# Patient Record
Sex: Male | Born: 1940 | Race: Black or African American | Hispanic: No | Smoking: Current every day smoker
Health system: Southern US, Community
[De-identification: ages and names within clinical notes are randomized; demographics above are authoritative.]

## PROBLEM LIST (undated history)

## (undated) DIAGNOSIS — J439 Emphysema, unspecified: Secondary | ICD-10-CM

## (undated) DIAGNOSIS — D649 Anemia, unspecified: Secondary | ICD-10-CM

## (undated) DIAGNOSIS — K209 Esophagitis, unspecified without bleeding: Secondary | ICD-10-CM

## (undated) DIAGNOSIS — I671 Cerebral aneurysm, nonruptured: Secondary | ICD-10-CM

## (undated) DIAGNOSIS — I10 Essential (primary) hypertension: Secondary | ICD-10-CM

## (undated) DIAGNOSIS — E7800 Pure hypercholesterolemia, unspecified: Secondary | ICD-10-CM

## (undated) DIAGNOSIS — I700 Atherosclerosis of aorta: Secondary | ICD-10-CM

## (undated) DIAGNOSIS — K259 Gastric ulcer, unspecified as acute or chronic, without hemorrhage or perforation: Secondary | ICD-10-CM

## (undated) DIAGNOSIS — J449 Chronic obstructive pulmonary disease, unspecified: Secondary | ICD-10-CM

## (undated) DIAGNOSIS — F17200 Nicotine dependence, unspecified, uncomplicated: Secondary | ICD-10-CM

## (undated) DIAGNOSIS — A048 Other specified bacterial intestinal infections: Secondary | ICD-10-CM

## (undated) HISTORY — DX: Gastric ulcer, unspecified as acute or chronic, without hemorrhage or perforation: K25.9

## (undated) HISTORY — DX: Other specified bacterial intestinal infections: A04.8

## (undated) HISTORY — DX: Chronic obstructive pulmonary disease, unspecified: J44.9

## (undated) HISTORY — DX: Emphysema, unspecified: J43.9

## (undated) HISTORY — DX: Atherosclerosis of aorta: I70.0

## (undated) HISTORY — DX: Esophagitis, unspecified without bleeding: K20.90

## (undated) HISTORY — DX: Anemia, unspecified: D64.9

## (undated) HISTORY — DX: Cerebral aneurysm, nonruptured: I67.1

---

## 2017-09-19 ENCOUNTER — Other Ambulatory Visit: Payer: Self-pay

## 2017-09-19 ENCOUNTER — Inpatient Hospital Stay (HOSPITAL_BASED_OUTPATIENT_CLINIC_OR_DEPARTMENT_OTHER)
Admission: EM | Admit: 2017-09-19 | Discharge: 2017-09-21 | DRG: 871 | Disposition: A | Discharge status: Home / Self Care | Payer: Medicare Other | Attending: Internal Medicine | Admitting: Internal Medicine

## 2017-09-19 ENCOUNTER — Emergency Department (HOSPITAL_BASED_OUTPATIENT_CLINIC_OR_DEPARTMENT_OTHER): Payer: Medicare Other

## 2017-09-19 ENCOUNTER — Encounter (HOSPITAL_BASED_OUTPATIENT_CLINIC_OR_DEPARTMENT_OTHER): Payer: Self-pay | Admitting: *Deleted

## 2017-09-19 DIAGNOSIS — Z23 Encounter for immunization: Secondary | ICD-10-CM | POA: Diagnosis present

## 2017-09-19 DIAGNOSIS — J181 Lobar pneumonia, unspecified organism: Secondary | ICD-10-CM | POA: Diagnosis present

## 2017-09-19 DIAGNOSIS — Z79899 Other long term (current) drug therapy: Secondary | ICD-10-CM

## 2017-09-19 DIAGNOSIS — A419 Sepsis, unspecified organism: Secondary | ICD-10-CM

## 2017-09-19 DIAGNOSIS — I5030 Unspecified diastolic (congestive) heart failure: Secondary | ICD-10-CM | POA: Diagnosis not present

## 2017-09-19 DIAGNOSIS — J189 Pneumonia, unspecified organism: Secondary | ICD-10-CM | POA: Diagnosis present

## 2017-09-19 DIAGNOSIS — E785 Hyperlipidemia, unspecified: Secondary | ICD-10-CM | POA: Diagnosis present

## 2017-09-19 DIAGNOSIS — F17210 Nicotine dependence, cigarettes, uncomplicated: Secondary | ICD-10-CM | POA: Diagnosis present

## 2017-09-19 DIAGNOSIS — I471 Supraventricular tachycardia: Secondary | ICD-10-CM | POA: Diagnosis not present

## 2017-09-19 HISTORY — DX: Sepsis, unspecified organism: A41.9

## 2017-09-19 HISTORY — DX: Pure hypercholesterolemia, unspecified: E78.00

## 2017-09-19 LAB — BASIC METABOLIC PANEL
ANION GAP: 12 (ref 5–15)
BUN: 13 mg/dL (ref 6–20)
CO2: 22 mmol/L (ref 22–32)
Calcium: 8.8 mg/dL — ABNORMAL LOW (ref 8.9–10.3)
Chloride: 104 mmol/L (ref 101–111)
Creatinine, Ser: 1.13 mg/dL (ref 0.61–1.24)
GFR calc Af Amer: >60 mL/min (ref >60)
GFR calc non Af Amer: >60 mL/min (ref >60)
Glucose, Bld: 126 mg/dL — ABNORMAL HIGH (ref 65–99)
POTASSIUM: 3.6 mmol/L (ref 3.5–5.1)
SODIUM: 138 mmol/L (ref 135–145)

## 2017-09-19 LAB — I-STAT VENOUS BLOOD GAS, ED
ACID-BASE DEFICIT: 1 mmol/L (ref 0.0–2.0)
Bicarbonate: 23.7 mmol/L (ref 20.0–28.0)
O2 SAT: 80 %
PH VEN: 7.396 (ref 7.250–7.430)
TCO2: 25 mmol/L (ref 22–32)
pCO2, Ven: 38.9 mmHg — ABNORMAL LOW (ref 44.0–60.0)
pO2, Ven: 46 mmHg — ABNORMAL HIGH (ref 32.0–45.0)

## 2017-09-19 LAB — CBC
HCT: 42.2 % (ref 39.0–52.0)
HEMOGLOBIN: 14.2 g/dL (ref 13.0–17.0)
MCH: 31.2 pg (ref 26.0–34.0)
MCHC: 33.6 g/dL (ref 30.0–36.0)
MCV: 92.7 fL (ref 78.0–100.0)
Platelets: 272 10*3/uL (ref 150–400)
RBC: 4.55 MIL/uL (ref 4.22–5.81)
RDW: 14 % (ref 11.5–15.5)
WBC: 8.3 10*3/uL (ref 4.0–10.5)

## 2017-09-19 LAB — I-STAT CG4 LACTIC ACID, ED: LACTIC ACID, VENOUS: 1.23 mmol/L (ref 0.5–1.9)

## 2017-09-19 LAB — INFLUENZA PANEL BY PCR (TYPE A & B)
INFLAPCR: NEGATIVE
Influenza B By PCR: NEGATIVE

## 2017-09-19 MED ORDER — SODIUM CHLORIDE 0.9 % IV SOLN
1.0000 g | Freq: Once | INTRAVENOUS | Status: AC
  Administered 2017-09-19: 1 g via INTRAVENOUS
  Filled 2017-09-19: qty 10

## 2017-09-19 MED ORDER — IPRATROPIUM-ALBUTEROL 0.5-2.5 (3) MG/3ML IN SOLN
3.0000 mL | Freq: Three times a day (TID) | RESPIRATORY_TRACT | Status: DC
  Administered 2017-09-20: 3 mL via RESPIRATORY_TRACT
  Filled 2017-09-19: qty 3

## 2017-09-19 MED ORDER — IPRATROPIUM-ALBUTEROL 0.5-2.5 (3) MG/3ML IN SOLN
3.0000 mL | Freq: Four times a day (QID) | RESPIRATORY_TRACT | Status: DC
  Administered 2017-09-19: 3 mL via RESPIRATORY_TRACT
  Filled 2017-09-19: qty 3

## 2017-09-19 MED ORDER — SODIUM CHLORIDE 0.9 % IV SOLN
500.0000 mg | Freq: Once | INTRAVENOUS | Status: AC
  Administered 2017-09-19: 500 mg via INTRAVENOUS
  Filled 2017-09-19: qty 500

## 2017-09-19 MED ORDER — ACETAMINOPHEN 325 MG PO TABS
650.0000 mg | ORAL_TABLET | Freq: Once | ORAL | Status: AC
  Administered 2017-09-19: 650 mg via ORAL
  Filled 2017-09-19: qty 2

## 2017-09-19 MED ORDER — AZITHROMYCIN 500 MG IV SOLR
INTRAVENOUS | Status: AC
  Filled 2017-09-19: qty 500

## 2017-09-19 MED ORDER — SODIUM CHLORIDE 0.9 % IV BOLUS (SEPSIS): 1000.0000 mL | Freq: Once | INTRAVENOUS | Status: DC

## 2017-09-19 NOTE — ED Notes (Signed)
Pt. Has been sick per Daughter since Wednesday with upper resp. Illness and cough.  Pt. Today was lying in bed fully clothed and acting tired and sleepy daughter brought pt. Here to ED.  Pt. Sat on arrival to room 89-90% on RA.

## 2017-09-19 NOTE — ED Triage Notes (Signed)
Body aches, sore throat, weakness x 2 days. Tylenol last night.

## 2017-09-19 NOTE — Plan of Care (Signed)
[facility] by Dr. Adela LankFloyd None Pcp    Selmer DominionWillie Platner 77 y.o. With  Past hx of hypercholesteremia     Presents today with flu like symptoms of 3 days  Blood pressure (!) 149/81, pulse 90, temperature (!) 102.5 F (39.2 C), temperature source Oral, resp. rate 18, height 6' (1.829 m), weight 83 kg (183 lb), SpO2 99 %. On 2L    Significant findings:       Lactic Acid, Venous    Component Value Date/Time   LATICACIDVEN 1.23 09/19/2017 1851      Radiology  CXR: infiltrate  Worrisome for pneumonia in the setting of fluid symptoms and Sirs  Started azythro and rocephine     Plan to Admit inpatient  On TELE  Shekita Boyden 8:15 PM

## 2017-09-19 NOTE — ED Notes (Signed)
Called to give report to RN   RN in Pt. Room said she will call back for Report.  Gave my name and number

## 2017-09-19 NOTE — ED Provider Notes (Signed)
[facility] EMERGENCY DEPARTMENT Provider Note   CSN: [id number] Arrival date & time: 09/19/17  1753     History   Chief Complaint No chief complaint on file.   HPI Adrian Rodgers is a 77 y.o. male presenting for evaluation of SOB, cough, and weakness.  Pt states he has been feeling poorly since Wednesday. He reports nonproductive cough, fevers, generalized weakness and muscles aches. He had a sore throat, which improved. He denies HA, vision changes, ear pain, CP, SOB, wheezing, n/v, abd pain, urinary sxs, abnormal BMs, or leg pain or swelling. No sick contacts. He states he has no medical problems, but has not seen a doctor recently. He denies h/o cardiac or pulmonary problems. No h/o DM or htn. Take cholesterol meds daily, no other meds. Smokes ~ 1 ppd daily, no etoh or other drug use.   HPI  Past Medical History:  Diagnosis Date  . High cholesterol     There are no active problems to display for this patient.   History reviewed. No pertinent surgical history.      Home Medications    Prior to Admission medications   Not on File    Family History No family history on file.  Social History Social History   Tobacco Use  . Smoking status: Current Every Day Smoker  . Smokeless tobacco: Never Used  Substance Use Topics  . Alcohol use: Never    Frequency: Never  . Drug use: Never     Allergies   Patient has no known allergies.   Review of Systems Review of Systems  Constitutional: Positive for fever.  HENT: Positive for sore throat (resolved).   Respiratory: Positive for cough.   Musculoskeletal: Positive for myalgias.  Neurological: Positive for weakness.  All other systems reviewed and are negative.    Physical Exam Updated Vital Signs BP (!) 149/81 (BP Location: Right Arm)   Pulse 90   Temp (!) 102.5 F (39.2 C) (Oral)   Resp 18   Ht 6' (1.829 m)   Wt 83 kg (183 lb)   SpO2 99%   BMI 24.82 kg/m   Physical Exam    Constitutional: He is oriented to person, place, and time. He appears well-developed and well-nourished. No distress.  Pt appears dehydrated. No acute distress.   HENT:  Head: Normocephalic and atraumatic.  Mouth/Throat: Uvula is midline. Mucous membranes are dry. No oropharyngeal exudate or posterior oropharyngeal edema.  Eyes: Pupils are equal, round, and reactive to light. Conjunctivae and EOM are normal.  Neck: Normal range of motion. Neck supple.  Cardiovascular: Regular rhythm and intact distal pulses.  mildly tachycardic ~100  Pulmonary/Chest: Effort normal and breath sounds normal. No respiratory distress. He exhibits no tenderness.  When moving form wheelchair to bed, pt wheezing, which resolved at rest. No expiratory distress. SpO2 ~88% on RA, 94% on 2 L O2. Lungs without wheezing, ?diminshed  Abdominal: Soft. He exhibits no distension and no mass. There is no tenderness. There is no guarding.  Musculoskeletal: Normal range of motion. He exhibits no edema or tenderness.  Radial and pedal pulses intact bilaterally. No leg pain or swelling.   Neurological: He is alert and oriented to person, place, and time.  Skin: Skin is warm and dry.  Psychiatric: He has a normal mood and affect.  Nursing note and vitals reviewed.    ED Treatments / Results  Labs (all labs ordered are listed, but only abnormal results are displayed) Labs Reviewed  BASIC METABOLIC PANEL -  Abnormal; Notable for the following components:      Result Value   Glucose, Bld 126 (*)    Calcium 8.8 (*)    All other components within normal limits  I-STAT VENOUS BLOOD GAS, ED - Abnormal; Notable for the following components:   pCO2, Ven 38.9 (*)    pO2, Ven 46.0 (*)    All other components within normal limits  CULTURE, BLOOD (ROUTINE X 2)  CULTURE, BLOOD (ROUTINE X 2)  CBC  LACTIC ACID, PLASMA  LACTIC ACID, PLASMA  INFLUENZA PANEL BY PCR (TYPE A & B)  URINALYSIS, ROUTINE W REFLEX MICROSCOPIC  I-STAT CG4  LACTIC ACID, ED    EKG None  Radiology Dg Chest 2 View  Result Date: 09/19/2017 CLINICAL DATA:  Cough, congestion, shortness of breath and fever for 3 days EXAM: CHEST - 2 VIEW COMPARISON:  None FINDINGS: Enlargement of cardiac silhouette. Mediastinal contours and pulmonary vascularity normal. Opacity at medial RIGHT lung base could represent atelectasis or infiltrate. Remaining lungs clear. No pleural effusion or pneumothorax. Bones unremarkable. Lungs otherwise clear IMPRESSION: Enlargement of cardiac silhouette. Opacity at the medial RIGHT lung base question atelectasis versus infiltrate RIGHT middle lobe. Electronically Signed   By: Ulyses SouthwardMark  Boles M.D.   On: 09/19/2017 18:43    Procedures Procedures (including critical care time)  Medications Ordered in ED Medications  sodium chloride 0.9 % bolus 1,000 mL (has no administration in time range)  ipratropium-albuterol (DUONEB) 0.5-2.5 (3) MG/3ML nebulizer solution 3 mL (3 mLs Nebulization Given 09/19/17 1920)  azithromycin (ZITHROMAX) 500 mg in sodium chloride 0.9 % 250 mL IVPB (has no administration in time range)  azithromycin (ZITHROMAX) 500 MG injection (has no administration in time range)  acetaminophen (TYLENOL) tablet 650 mg (650 mg Oral Given 09/19/17 1854)  cefTRIAXone (ROCEPHIN) 1 g in sodium chloride 0.9 % 100 mL IVPB (0 g Intravenous Stopped 09/19/17 1952)     Initial Impression / Assessment and Plan / ED Course  I have reviewed the triage vital signs and the nursing notes.  Pertinent labs & imaging results that were available during my care of the patient were reviewed by me and considered in my medical decision making (see chart for details).     Pt presenting for evaluation of cough, weakness, and fever. Physical exam shows pt who is tachycardic, febrile, and with low SpO2. He appears dehydrated. Initially wheezing after exertions, no pulmonary sounds at rest. SpO2 improved with O2. Will obtain cxr, labs, lactic, vbg, flu,  and blood cultures. UA ordered. Tylenol and IVF started.  CXR concerning for PNA. Will start IV abx. vbg and lactic reassuring. Labs without leukocytosis, otherwise reassuring. Will call to admit due to pt's PNA and SpO2.   Pt signed out to D. Adela LankFloyd, MD for f/u on pt admission.   Final Clinical Impressions(s) / ED Diagnoses   Final diagnoses:  Pneumonia of right middle lobe due to infectious organism [facility](HCC)    ED Discharge Orders    None       Alveria ApleyCaccavale, Patriece Archbold, PA-C 09/19/17 2005    Melene PlanFloyd, Dan, DO 09/19/17 2330

## 2017-09-19 NOTE — ED Notes (Signed)
Patient transported to X-ray 

## 2017-09-20 DIAGNOSIS — J189 Pneumonia, unspecified organism: Secondary | ICD-10-CM | POA: Diagnosis present

## 2017-09-20 DIAGNOSIS — J181 Lobar pneumonia, unspecified organism: Secondary | ICD-10-CM

## 2017-09-20 DIAGNOSIS — A419 Sepsis, unspecified organism: Principal | ICD-10-CM

## 2017-09-20 HISTORY — DX: Pneumonia, unspecified organism: J18.9

## 2017-09-20 LAB — EXPECTORATED SPUTUM ASSESSMENT W REFEX TO RESP CULTURE

## 2017-09-20 LAB — URINALYSIS, ROUTINE W REFLEX MICROSCOPIC
Bilirubin Urine: NEGATIVE
Glucose, UA: NEGATIVE mg/dL
Ketones, ur: 20 mg/dL — AB
Nitrite: NEGATIVE
PROTEIN: NEGATIVE mg/dL
Specific Gravity, Urine: 1.027 (ref 1.005–1.030)
pH: 5 (ref 5.0–8.0)

## 2017-09-20 LAB — STREP PNEUMONIAE URINARY ANTIGEN: Strep Pneumo Urinary Antigen: NEGATIVE

## 2017-09-20 LAB — HIV ANTIBODY (ROUTINE TESTING W REFLEX): HIV SCREEN 4TH GENERATION: NONREACTIVE

## 2017-09-20 MED ORDER — BENZONATATE 100 MG PO CAPS
200.0000 mg | ORAL_CAPSULE | Freq: Three times a day (TID) | ORAL | Status: DC | PRN
Start: 2017-09-20 — End: 2017-09-21

## 2017-09-20 MED ORDER — ATORVASTATIN CALCIUM 40 MG PO TABS
40.0000 mg | ORAL_TABLET | Freq: Every day | ORAL | Status: DC
  Administered 2017-09-20: 40 mg via ORAL
  Filled 2017-09-20: qty 1

## 2017-09-20 MED ORDER — AZITHROMYCIN 250 MG PO TABS
500.0000 mg | ORAL_TABLET | ORAL | Status: DC
Start: 2017-09-20 — End: 2017-09-21
  Administered 2017-09-20: 500 mg via ORAL
  Filled 2017-09-20: qty 2

## 2017-09-20 MED ORDER — INFLUENZA VAC SPLIT HIGH-DOSE 0.5 ML IM SUSY
0.5000 mL | PREFILLED_SYRINGE | INTRAMUSCULAR | Status: AC
  Administered 2017-09-21: 0.5 mL via INTRAMUSCULAR
  Filled 2017-09-20: qty 0.5

## 2017-09-20 MED ORDER — GUAIFENESIN-DM 100-10 MG/5ML PO SYRP: 5.0000 mL | ORAL_SOLUTION | ORAL | Status: DC | PRN

## 2017-09-20 MED ORDER — IPRATROPIUM-ALBUTEROL 0.5-2.5 (3) MG/3ML IN SOLN
3.0000 mL | Freq: Two times a day (BID) | RESPIRATORY_TRACT | Status: DC
  Administered 2017-09-20 – 2017-09-21 (×2): 3 mL via RESPIRATORY_TRACT
  Filled 2017-09-20 (×2): qty 3

## 2017-09-20 MED ORDER — SODIUM CHLORIDE 0.9 % IV SOLN
1.0000 g | INTRAVENOUS | Status: DC
  Administered 2017-09-20: 1 g via INTRAVENOUS
  Filled 2017-09-20: qty 10
  Filled 2017-09-20: qty 1

## 2017-09-20 MED ORDER — BENZONATATE 100 MG PO CAPS: 200.0000 mg | ORAL_CAPSULE | Freq: Three times a day (TID) | ORAL | Status: DC | PRN

## 2017-09-20 MED ORDER — ENOXAPARIN SODIUM 40 MG/0.4ML ~~LOC~~ SOLN
40.0000 mg | SUBCUTANEOUS | Status: DC
  Administered 2017-09-20 – 2017-09-21 (×2): 40 mg via SUBCUTANEOUS
  Filled 2017-09-20 (×2): qty 0.4

## 2017-09-20 NOTE — Progress Notes (Signed)
Called by central telemetry monitor, made aware pt had a short burst of SVT to 170s. Pt resting in bed at the time, talking on phone. Denies SOB, palpitations, dizziness, or any other symtoms. Episode reviewed on telemetry. Short burst noted and then returned to NSR.  Dr Clearnce SorrelPurohit paged and made aware. Will monitor for orders.

## 2017-09-20 NOTE — Progress Notes (Signed)
PROGRESS NOTE    Adrian Rodgers  [id number] DOB: [birth year] DOA: 09/19/2017 [facility]CP: [facility]     Brief Narrative:   77 year old with past medical history relevant for hyperlipidemia admitted with fevers and weakness and found to have sepsis secondary to right middle lobe pneumonia.   Assessment & Plan:   Principal Problem:   Community acquired pneumonia of right middle lobe of lung (HCC) Active Problems:   Sepsis (HCC)   #) Sepsis secondary to community-acquired pneumonia right middle lobe: Improving however patient is still requiring 2 L oxygen supplementation when he is ambulating. -Physical therapy consult -Continue IV ceftriaxone and azithromycin started 09/20/2017 -Continue duo nebs twice daily  #) Hyperlipidemia: - Continue atorvastatin 40 mg nightly  #) SVT: Patient had brief run of SVT while on telemetry here however he was asymptomatic. -Echo ordered  Fluids: Tolerating p.o. Electrolytes: Monitor and supplement Nutrition: Heart healthy diet  Prophylaxis: enox  Disposition: Pending weaning to room air and PT recommendations  Full code    Consultants:   None  Procedures: (Don't include imaging studies which can be auto populated. Include things that cannot be auto populated i.e. Echo, Carotid and venous dopplers, Foley, Bipap, HD, tubes/drains, wound vac, central lines etc)  None  Antimicrobials: (specify start and planned stop date. Auto populated tables are space occupying and do not give end dates)  IV ceftriaxone and azithromycin started 09/20/2017   Subjective: Patient reports that he is doing better than when he came in.  He reports shortness of breath, cough, congestion have improved.  He reports being fatigued but less so than before.  He briefly had a run of SVT that was asymptomatic this morning.  He denies any history of this  Objective: Vitals:   09/20/17 0802 09/20/17 1238 09/20/17 1243 09/20/17 1254  BP:        Pulse:      Resp:      Temp:      TempSrc:      SpO2: (!) 88% 94% 90% 93%  Weight:      Height:        Intake/Output Summary (Last 24 hours) at 09/20/2017 1316 Last data filed at 09/20/2017 [id number] Gross per 24 hour  Intake 340 ml  Output 300 ml  Net 40 ml   Filed Weights   09/19/17 1758 09/19/17 2244  Weight: 83 kg (183 lb) 80 kg (176 lb 5.9 oz)    Examination:  General exam: Appears calm and comfortable  Respiratory system: Clear to auscultation. Respiratory effort normal.  Scattered rhonchi on 2 L nasal cannula Cardiovascular system: Regular rate and rhythm, no murmurs Gastrointestinal system: Abdomen is nondistended, soft and nontender. No organomegaly or masses felt. Normal bowel sounds heard. Central nervous system: Alert and oriented. No focal neurological deficits. Extremities: No lower extremity edema Skin: No rashes on visible skin Psychiatry: Judgement and insight appear normal. Mood & affect appropriate.     Data Reviewed: I have personally reviewed following labs and imaging studies  CBC: Recent Labs  Lab 09/19/17 1849  WBC 8.3  HGB 14.2  HCT 42.2  MCV 92.7  PLT 272   Basic Metabolic Panel: Recent Labs  Lab 09/19/17 1849  NA 138  K 3.6  CL 104  CO2 22  GLUCOSE 126*  BUN 13  CREATININE 1.13  CALCIUM 8.8*   GFR: Estimated Creatinine Clearance: 62 mL/min (by C-G formula based on SCr of 1.13 mg/dL). Liver Function Tests: No results for input(s): AST, ALT, ALKPHOS,  BILITOT, PROT, ALBUMIN in the last 168 hours. No results for input(s): LIPASE, AMYLASE in the last 168 hours. No results for input(s): AMMONIA in the last 168 hours. Coagulation Profile: No results for input(s): INR, PROTIME in the last 168 hours. Cardiac Enzymes: No results for input(s): CKTOTAL, CKMB, CKMBINDEX, TROPONINI in the last 168 hours. BNP (last 3 results) No results for input(s): PROBNP in the last 8760 hours. HbA1C: No results for input(s): HGBA1C in the last 72  hours. CBG: No results for input(s): GLUCAP in the last 168 hours. Lipid Profile: No results for input(s): CHOL, HDL, LDLCALC, TRIG, CHOLHDL, LDLDIRECT in the last 72 hours. Thyroid Function Tests: No results for input(s): TSH, T4TOTAL, FREET4, T3FREE, THYROIDAB in the last 72 hours. Anemia Panel: No results for input(s): VITAMINB12, FOLATE, FERRITIN, TIBC, IRON, RETICCTPCT in the last 72 hours. Sepsis Labs: Recent Labs  Lab 09/19/17 1851  LATICACIDVEN 1.23    Recent Results (from the past 240 hour(s))  Culture, sputum-assessment     Status: None   Collection Time: 09/20/17  5:13 AM  Result Value Ref Range Status   Specimen Description SPUTUM  Final   Special Requests NONE  Final   Sputum evaluation   Final    THIS SPECIMEN IS ACCEPTABLE FOR SPUTUM CULTURE Performed at [facility], [address] [place], [place] [address]    Report Status 09/20/2017 FINAL  Final         Radiology Studies: Dg Chest 2 View  Result Date: 09/19/2017 CLINICAL DATA:  Cough, congestion, shortness of breath and fever for 3 days EXAM: CHEST - 2 VIEW COMPARISON:  None FINDINGS: Enlargement of cardiac silhouette. Mediastinal contours and pulmonary vascularity normal. Opacity at medial RIGHT lung base could represent atelectasis or infiltrate. Remaining lungs clear. No pleural effusion or pneumothorax. Bones unremarkable. Lungs otherwise clear IMPRESSION: Enlargement of cardiac silhouette. Opacity at the medial RIGHT lung base question atelectasis versus infiltrate RIGHT middle lobe. Electronically Signed   By: Ulyses Southward M.D.   On: 09/19/2017 18:43        Scheduled Meds: . atorvastatin  40 mg Oral q1800  . azithromycin  500 mg Oral Q24H  . enoxaparin (LOVENOX) injection  40 mg Subcutaneous Q24H  . ipratropium-albuterol  3 mL Nebulization BID   Continuous Infusions: . cefTRIAXone (ROCEPHIN)  IV    . sodium chloride Stopped (09/19/17 2014)     LOS: 1 day    Time  spent: 30    Delaine Lame, MD [facility]   If 7PM-7AM, please contact night-coverage [email or web address] Password [email or web address] 09/20/2017, 1:16 PM

## 2017-09-20 NOTE — H&P (Signed)
History and Physical    Adrian Rodgers UJW:[id number] DOB: Jan 08, 1941 DOA: 09/19/2017  PCP: [facility]  Patient coming from: Home  I have personally briefly reviewed patient's old medical records in [facility]  Chief Complaint: ILI  HPI: Adrian Rodgers is a 77 y.o. male with medical history significant of HLD.  Patient presents to the ED at [facility] with 3 day h/o cough, congestion, fevers, generalized weakness, muscle aches, sore throat.  No sick contacts, no headaches.  Daughter felt he was lethargic today and not getting out of bed so brought him in to [facility].   ED Course: RML PNA on CXR.  Tm 102.5.  1L NS bolus, rocephin and azithromycin.   Review of Systems: As per HPI otherwise 10 point review of systems negative.   Past Medical History:  Diagnosis Date  . High cholesterol     History reviewed. No pertinent surgical history.   reports that he has been smoking.  He has never used smokeless tobacco. He reports that he does not drink alcohol or use drugs.  No Known Allergies  No family history on file. No recent sick contacts in family.  Prior to Admission medications   Medication Sig Start Date End Date Taking? Authorizing Provider  atorvastatin (LIPITOR) 40 MG tablet Take 40 mg by mouth daily.   Yes [provider]  Phenyleph-Doxylamine-DM-APAP (TYLENOL COLD MULTI-SYMPTOM) 5-6.25-10-325 MG/15ML LIQD Take 30 mLs by mouth every 6 (six) hours as needed (cold symptoms).   Yes [provider]    Physical Exam: Vitals:   09/19/17 1920 09/19/17 2010 09/19/17 2130 09/19/17 2244  BP:   129/81 (!) 150/84  Pulse:   77 75  Resp:  19 (!) 28 18  Temp:    98.7 F (37.1 C)  TempSrc:  Oral  Oral  SpO2: 99% 96% 97% 98%  Weight:    80 kg (176 lb 5.9 oz)  Height:    6' (1.829 m)    Constitutional: NAD, calm, comfortable Eyes: PERRL, lids and conjunctivae normal ENMT: Mucous membranes are moist. Posterior pharynx clear of any exudate or  lesions.Normal dentition.  Neck: normal, supple, no masses, no thyromegaly Respiratory: Rales in R lung Cardiovascular: Regular rate and rhythm, no murmurs / rubs / gallops. No extremity edema. 2+ pedal pulses. No carotid bruits.  Abdomen: no tenderness, no masses palpated. No hepatosplenomegaly. Bowel sounds positive.  Musculoskeletal: no clubbing / cyanosis. No joint deformity upper and lower extremities. Good ROM, no contractures. Normal muscle tone.  Skin: no rashes, lesions, ulcers. No induration Neurologic: CN 2-12 grossly intact. Sensation intact, DTR normal. Strength 5/5 in all 4.  Psychiatric: Normal judgment and insight. Alert and oriented x 3. Normal mood.    Labs on Admission: I have personally reviewed following labs and imaging studies  CBC: Recent Labs  Lab 09/19/17 1849  WBC 8.3  HGB 14.2  HCT 42.2  MCV 92.7  PLT 272   Basic Metabolic Panel: Recent Labs  Lab 09/19/17 1849  NA 138  K 3.6  CL 104  CO2 22  GLUCOSE 126*  BUN 13  CREATININE 1.13  CALCIUM 8.8*   GFR: Estimated Creatinine Clearance: 62 mL/min (by C-G formula based on SCr of 1.13 mg/dL). Liver Function Tests: No results for input(s): AST, ALT, ALKPHOS, BILITOT, PROT, ALBUMIN in the last 168 hours. No results for input(s): LIPASE, AMYLASE in the last 168 hours. No results for input(s): AMMONIA in the last 168 hours. Coagulation Profile: No results for input(s):  INR, PROTIME in the last 168 hours. Cardiac Enzymes: No results for input(s): CKTOTAL, CKMB, CKMBINDEX, TROPONINI in the last 168 hours. BNP (last 3 results) No results for input(s): PROBNP in the last 8760 hours. HbA1C: No results for input(s): HGBA1C in the last 72 hours. CBG: No results for input(s): GLUCAP in the last 168 hours. Lipid Profile: No results for input(s): CHOL, HDL, LDLCALC, TRIG, CHOLHDL, LDLDIRECT in the last 72 hours. Thyroid Function Tests: No results for input(s): TSH, T4TOTAL, FREET4, T3FREE, THYROIDAB in the  last 72 hours. Anemia Panel: No results for input(s): VITAMINB12, FOLATE, FERRITIN, TIBC, IRON, RETICCTPCT in the last 72 hours. Urine analysis: No results found for: COLORURINE, APPEARANCEUR, LABSPEC, PHURINE, GLUCOSEU, HGBUR, BILIRUBINUR, KETONESUR, PROTEINUR, UROBILINOGEN, NITRITE, LEUKOCYTESUR  Radiological Exams on Admission: Dg Chest 2 View  Result Date: 09/19/2017 CLINICAL DATA:  Cough, congestion, shortness of breath and fever for 3 days EXAM: CHEST - 2 VIEW COMPARISON:  None FINDINGS: Enlargement of cardiac silhouette. Mediastinal contours and pulmonary vascularity normal. Opacity at medial RIGHT lung base could represent atelectasis or infiltrate. Remaining lungs clear. No pleural effusion or pneumothorax. Bones unremarkable. Lungs otherwise clear IMPRESSION: Enlargement of cardiac silhouette. Opacity at the medial RIGHT lung base question atelectasis versus infiltrate RIGHT middle lobe. Electronically Signed   By: Ulyses SouthwardMark  Boles M.D.   On: 09/19/2017 18:43    EKG: Independently reviewed.  Assessment/Plan Principal Problem:   Community acquired pneumonia of right middle lobe of lung (HCC) Active Problems:   Sepsis (HCC)    1. RML CAP - 1. PNA pathway 2. Continue rocephin and azithro 3. Cultures pending 4. Influenza PCR is pending 5. O2 via  as needed, satting 90% at rest on RA 2. Mild Sepsis - 1. Fever, tachycardia resolved after IVF, tylenol 2. Lactate nl 3. WBC nl  DVT prophylaxis: Lovenox Code Status: Full Family Communication: Daughter at bedside Disposition Plan: Home after admit Consults called: None Admission status: Admit to inpatient   Hillary BowGARDNER, JARED M. DO [facility] Pager [phone]  If 7AM-7PM, please contact day team taking care of patient [email or web address] Password [email or web address]  09/20/2017, 12:11 AM

## 2017-09-20 NOTE — Progress Notes (Signed)
Pt ambulated in hall with slightly unsteady gait, contact guard. O2 sat on RA with ambulation 87%. Pt recovered to 93% at rest. Dr Clearnce SorrelPurohit aware.

## 2017-09-21 ENCOUNTER — Inpatient Hospital Stay (HOSPITAL_COMMUNITY): Payer: Medicare Other

## 2017-09-21 DIAGNOSIS — I5030 Unspecified diastolic (congestive) heart failure: Secondary | ICD-10-CM

## 2017-09-21 LAB — ECHOCARDIOGRAM COMPLETE
Height: 72 in
Weight: 2821.89 oz

## 2017-09-21 MED ORDER — CEFDINIR 300 MG PO CAPS: 300.0000 mg | ORAL_CAPSULE | Freq: Two times a day (BID) | ORAL | 0 refills | Status: DC

## 2017-09-21 MED ORDER — GUAIFENESIN-DM 100-10 MG/5ML PO SYRP: 5.0000 mL | ORAL_SOLUTION | ORAL | 0 refills | Status: DC | PRN

## 2017-09-21 MED ORDER — ALBUTEROL SULFATE HFA 108 (90 BASE) MCG/ACT IN AERS: 2.0000 | INHALATION_SPRAY | Freq: Four times a day (QID) | RESPIRATORY_TRACT | 2 refills | Status: AC | PRN

## 2017-09-21 MED ORDER — AZITHROMYCIN 250 MG PO TABS: 250.0000 mg | ORAL_TABLET | Freq: Every day | ORAL | 0 refills | Status: AC

## 2017-09-21 MED ORDER — SPACER/AERO CHAMBER MOUTHPIECE MISC: 1.0000 [IU] | 0 refills | Status: DC | PRN

## 2017-09-21 MED ORDER — IPRATROPIUM-ALBUTEROL 0.5-2.5 (3) MG/3ML IN SOLN: 3.0000 mL | RESPIRATORY_TRACT | Status: DC | PRN

## 2017-09-21 NOTE — Care Management Note (Addendum)
Case Management Note  Patient Details  Name: Selmer DominionWillie Molinelli MRN: [id number] Date of Birth: [birth year]  Subjective/Objective:  PNA                  Action/Plan: Spoke to pt and dtr, [name] # [phone] at bedside. Pt will be staying with dtr for a few weeks, address [address], [place] [id number]. Will update [facility] with new address. Contacted [facility] for oxygen for home to be delivered to room. Will deliver portable to room and home oxygen concentrator to home.   Expected Discharge Date:  09/21/17               Expected Discharge Plan:  Home/Self Care  In-House Referral:  NA  Discharge planning Services  CM Consult  Post Acute Care Choice:  NA Choice offered to:  NA  DME Arranged:  Oxygen DME Agency:  [facility]  HH Arranged:  NA HH Agency:  NA  Status of Service:  Completed, signed off  If discussed at Long Length of Stay Meetings, dates discussed:    Additional Comments:  Elliot CousinShavis, Dimond Crotty Ellen, RN 09/21/2017, 2:06 PM

## 2017-09-21 NOTE — Progress Notes (Signed)
PROGRESS NOTE    Adrian Rodgers  ZOX:[id number] DOB: Jun 08, 1941 DOA: 09/19/2017 PCP: [facility]     Brief Narrative:   77 year old with past medical history relevant for hyperlipidemia admitted with fevers and weakness and found to have sepsis secondary to right middle lobe pneumonia.   Assessment & Plan:   Principal Problem:   Community acquired pneumonia of right middle lobe of lung (HCC) Active Problems:   Sepsis (HCC)   #) Sepsis secondary to community-acquired pneumonia right middle lobe: Sepsis has resolved however patient still requiring 2 L nasal cannula -We will discharge home with home oxygen -Physical therapy consult -Continue IV ceftriaxone and azithromycin started 09/20/2017 -Continue duo nebs twice daily  #) Hyperlipidemia: - Continue atorvastatin 40 mg nightly  #) SVT: Patient had brief run of SVT while on telemetry here however he was asymptomatic. -Echo ordered, pending results -Telemetry  Fluids: Tolerating p.o. Electrolytes: Monitor and supplement Nutrition: Heart healthy diet  Prophylaxis: enox  Disposition: Pending discharge with home oxygen  Full code    Consultants:   None  Procedures: (Don't include imaging studies which can be auto populated. Include things that cannot be auto populated i.e. Echo, Carotid and venous dopplers, Foley, Bipap, HD, tubes/drains, wound vac, central lines etc)  None  Antimicrobials: (specify start and planned stop date. Auto populated tables are space occupying and do not give end dates)  IV ceftriaxone and azithromycin started 09/20/2017   Subjective: Patient reports that he is almost feeling to baseline.  He does not have any shortness of breath at rest or with exertion however his oxygen saturations continued to drop to 86% with ambulation on room air.  Objective: Vitals:   09/20/17 2009 09/20/17 2053 09/21/17 0528 09/21/17 0753  BP:  (!) 142/76 139/85   Pulse:  84 75   Resp:  18  16   Temp:  99.6 F (37.6 C) 98.8 F (37.1 C)   TempSrc:  Oral Oral   SpO2: 93% 95% 97% (!) 86%  Weight:      Height:        Intake/Output Summary (Last 24 hours) at 09/21/2017 1103 Last data filed at 09/21/2017 1000 Gross per 24 hour  Intake 1330 ml  Output -  Net 1330 ml   Filed Weights   09/19/17 1758 09/19/17 2244  Weight: 83 kg (183 lb) 80 kg (176 lb 5.9 oz)    Examination:  General exam: Appears calm and comfortable  Respiratory system: Clear to auscultation. Respiratory effort normal.  Scattered rhonchi on 2 L nasal cannula Cardiovascular system: Regular rate and rhythm, no murmurs Gastrointestinal system: Abdomen is nondistended, soft and nontender. No organomegaly or masses felt. Normal bowel sounds heard. Central nervous system: Alert and oriented. No focal neurological deficits. Extremities: No lower extremity edema Skin: No rashes on visible skin Psychiatry: Judgement and insight appear normal. Mood & affect appropriate.     Data Reviewed: I have personally reviewed following labs and imaging studies  CBC: Recent Labs  Lab 09/19/17 1849  WBC 8.3  HGB 14.2  HCT 42.2  MCV 92.7  PLT 272   Basic Metabolic Panel: Recent Labs  Lab 09/19/17 1849  NA 138  K 3.6  CL 104  CO2 22  GLUCOSE 126*  BUN 13  CREATININE 1.13  CALCIUM 8.8*   GFR: Estimated Creatinine Clearance: 62 mL/min (by C-G formula based on SCr of 1.13 mg/dL). Liver Function Tests: No results for input(s): AST, ALT, ALKPHOS, BILITOT, PROT, ALBUMIN in the last  168 hours. No results for input(s): LIPASE, AMYLASE in the last 168 hours. No results for input(s): AMMONIA in the last 168 hours. Coagulation Profile: No results for input(s): INR, PROTIME in the last 168 hours. Cardiac Enzymes: No results for input(s): CKTOTAL, CKMB, CKMBINDEX, TROPONINI in the last 168 hours. BNP (last 3 results) No results for input(s): PROBNP in the last 8760 hours. HbA1C: No results for input(s): HGBA1C  in the last 72 hours. CBG: No results for input(s): GLUCAP in the last 168 hours. Lipid Profile: No results for input(s): CHOL, HDL, LDLCALC, TRIG, CHOLHDL, LDLDIRECT in the last 72 hours. Thyroid Function Tests: No results for input(s): TSH, T4TOTAL, FREET4, T3FREE, THYROIDAB in the last 72 hours. Anemia Panel: No results for input(s): VITAMINB12, FOLATE, FERRITIN, TIBC, IRON, RETICCTPCT in the last 72 hours. Sepsis Labs: Recent Labs  Lab 09/19/17 1851  LATICACIDVEN 1.23    Recent Results (from the past 240 hour(s))  Blood culture (routine x 2)     Status: None (Preliminary result)   Collection Time: 09/19/17  6:45 PM  Result Value Ref Range Status   Specimen Description   Final    BLOOD RIGHT ANTECUBITAL Performed at [facility], [address] [place], [place] [address]    Special Requests   Final    BOTTLES DRAWN AEROBIC AND ANAEROBIC Blood Culture adequate volume Performed at [facility], [address] [place], [place] [address]    Culture   Final    NO GROWTH < 24 HOURS Performed at [facility], [address] [place], [place] [address]    Report Status PENDING  Incomplete  Blood culture (routine x 2)     Status: None (Preliminary result)   Collection Time: 09/19/17  7:10 PM  Result Value Ref Range Status   Specimen Description   Final    BLOOD RIGHT FOREARM Performed at [facility], [address] [place], [place] [address]    Special Requests   Final    BOTTLES DRAWN AEROBIC AND ANAEROBIC Blood Culture adequate volume Performed at [facility], [address] [place], [place] [address]    Culture   Final    NO GROWTH < 24 HOURS Performed at [facility], [address], [place], [place] [address]    Report Status PENDING  Incomplete  Culture, sputum-assessment     Status: None   Collection Time: 09/20/17  5:13 AM  Result Value Ref Range Status   Specimen Description SPUTUM  Final   Special  Requests NONE  Final   Sputum evaluation   Final    THIS SPECIMEN IS ACCEPTABLE FOR SPUTUM CULTURE Performed at [facility], [address], [place], [place] [address]    Report Status 09/20/2017 FINAL  Final  Culture, respiratory (NON-Expectorated)     Status: None (Preliminary result)   Collection Time: 09/20/17  5:13 AM  Result Value Ref Range Status   Specimen Description   Final    SPUTUM Performed at [facility], [address], [place], [place] [address]    Special Requests   Final    NONE Reflexed from [id number] Performed at [facility], [address], [place], [place] [address]    Gram Stain   Final    ABUNDANT WBC PRESENT, PREDOMINANTLY PMN RARE SQUAMOUS EPITHELIAL CELLS PRESENT ABUNDANT GRAM POSITIVE COCCI IN CHAINS    Culture   Final  CULTURE REINCUBATED FOR BETTER GROWTH Performed at [facility], [address] [place], [place] [address]    Report Status PENDING  Incomplete         Radiology Studies: Dg Chest 2 View  Result Date: 09/19/2017 CLINICAL DATA:  Cough, congestion, shortness of breath and fever for 3 days EXAM: CHEST - 2 VIEW COMPARISON:  None FINDINGS: Enlargement of cardiac silhouette. Mediastinal contours and pulmonary vascularity normal. Opacity at medial RIGHT lung base could represent atelectasis or infiltrate. Remaining lungs clear. No pleural effusion or pneumothorax. Bones unremarkable. Lungs otherwise clear IMPRESSION: Enlargement of cardiac silhouette. Opacity at the medial RIGHT lung base question atelectasis versus infiltrate RIGHT middle lobe. Electronically Signed   By: Ulyses SouthwardMark  Boles M.D.   On: 09/19/2017 18:43        Scheduled Meds: . atorvastatin  40 mg Oral q1800  . azithromycin  500 mg Oral Q24H  . enoxaparin (LOVENOX) injection  40 mg Subcutaneous Q24H   Continuous Infusions: . cefTRIAXone (ROCEPHIN)  IV Stopped (09/20/17 1843)  . sodium chloride Stopped  (09/19/17 2014)     LOS: 2 days    Time spent: 30    Delaine LameShrey C Karron Goens, MD [facility]   If 7PM-7AM, please contact night-coverage [email or web address] Password [email or web address] 09/21/2017, 11:03 AM

## 2017-09-21 NOTE — Progress Notes (Signed)
Reviewed discharge information with patient and caregiver. Answered all questions. Patient/caregiver able to teach back medications and reasons to contact MD/911. Patient verbalizes importance of PCP follow up appointment. Oxygen has been delivered.  Earnest ConroyBrooke M. Clelia CroftShaw, RN

## 2017-09-21 NOTE — Discharge Summary (Signed)
Physician Discharge Summary  Adrian Rodgers ZOX:[id number] DOB: [birth year] DOA: 09/19/2017  PCP: [facility]  Admit date: 09/19/2017 Discharge date: 09/21/2017  Admitted From: Home Disposition: Home  Recommendations for Outpatient Follow-up:  1. Follow up with PCP in 1-2 weeks 2. Please obtain BMP/CBC in one week 3. Please discuss with your primary care doctor about discontinuing home oxygen when your lungs have completely recovered.   Home Health: None Equipment/Devices: 2 L home oxygen continuous  Discharge Condition: Stable CODE STATUS: Full Diet recommendation: Heart healthy  Brief/Interim Summary: #) Sepsis secondary to community acquired pneumonia right middle lobe.  Patient was admitted with sepsis and given sepsis fluids.  He was started on IV ceftriaxone and azithromycin and discharged home on oral azithromycin and Ceftin ear to complete a total of 7 days of antibiotics.  He was given inhaled bronchodilators twice a day here and discharged home on as needed inhaler with spacer.  Patient required 2 L continuous oxygen during his hospitalization here.  For 2 days patient was trialed off the oxygen and had desaturations to 86% with exertion and at rest.  He was discharged home with 2 L continuous nasal cannula to be weaned as an outpatient.  His blood cultures are no growth to date.  #)  hyperlipidemia: Patient was continued on atorvastatin 40 mg.  #) SVT: Patient was initially on telemetry and developed a very brief run of SVT overnight.  Patient was asymptomatic.  Echo done on 09/21/2017 showing grade 1 diastolic dysfunction with no evidence of wall abnormalities.  Patient had no recurrence of SVT.  Discharge Diagnoses:  Principal Problem:   Community acquired pneumonia of right middle lobe of lung (HCC) Active Problems:   Sepsis [facility])    Discharge Instructions  Discharge Instructions    Call MD for:  difficulty breathing, headache or visual disturbances    Complete by:  As directed    Call MD for:  persistant nausea and vomiting   Complete by:  As directed    Call MD for:  redness, tenderness, or signs of infection (pain, swelling, redness, odor or green/yellow discharge around incision site)   Complete by:  As directed    Call MD for:  temperature >100.4   Complete by:  As directed    Diet - low sodium heart healthy   Complete by:  As directed    Discharge instructions   Complete by:  As directed    Please find a new primary care doctor within 1 week and follow-up.  Please have them evaluate to see if you need continued oxygen over the next several days to weeks.  Please complete all of your antibiotics as prescribed.   For home use only DME oxygen   Complete by:  As directed    Mode or (Route):  Nasal cannula   Frequency:  Continuous (stationary and portable oxygen unit needed)   Oxygen conserving device:  Yes   Oxygen delivery system:  Gas   Increase activity slowly   Complete by:  As directed      Allergies as of 09/21/2017   No Known Allergies     Medication List    STOP taking these medications   TYLENOL COLD MULTI-SYMPTOM 5-6.25-10-325 MG/15ML Liqd Generic drug:  Phenyleph-Doxylamine-DM-APAP     TAKE these medications   albuterol 108 (90 Base) MCG/ACT inhaler Commonly known as:  PROVENTIL HFA;VENTOLIN HFA Inhale 2 puffs into the lungs every 6 (six) hours as needed for wheezing or shortness of breath.  atorvastatin 40 MG tablet Commonly known as:  LIPITOR Take 40 mg by mouth daily.   azithromycin 250 MG tablet Commonly known as:  ZITHROMAX Take 1 tablet (250 mg total) by mouth daily for 3 days.   cefdinir 300 MG capsule Commonly known as:  OMNICEF Take 1 capsule (300 mg total) by mouth 2 (two) times daily.   guaiFENesin-dextromethorphan 100-10 MG/5ML syrup Commonly known as:  ROBITUSSIN DM Take 5 mLs by mouth every 4 (four) hours as needed for cough.   Spacer/Aero Chamber Mouthpiece Misc 1 Units by Does not  apply route every 4 (four) hours as needed.            Durable Medical Equipment  (From admission, onward)        Start     Ordered   09/21/17 1300  DME Oxygen  Once    Question Answer Comment  Mode or (Route) Nasal cannula   Liters per Minute 2   Frequency Continuous (stationary and portable oxygen unit needed)   Oxygen conserving device Yes   Oxygen delivery system Gas      09/21/17 1259   09/21/17 1146  For home use only DME oxygen  Once    Comments:  Please evaluate for POC, simplygo  Question Answer Comment  Mode or (Route) Nasal cannula   Liters per Minute 2   Frequency Continuous (stationary and portable oxygen unit needed)   Oxygen delivery system Gas      09/21/17 1146   09/21/17 0000  For home use only DME oxygen    Question Answer Comment  Mode or (Route) Nasal cannula   Frequency Continuous (stationary and portable oxygen unit needed)   Oxygen conserving device Yes   Oxygen delivery system Gas      09/21/17 1259      No Known Allergies  Consultations:  None   Procedures/Studies: Dg Chest 2 View  Result Date: 09/19/2017 CLINICAL DATA:  Cough, congestion, shortness of breath and fever for 3 days EXAM: CHEST - 2 VIEW COMPARISON:  None FINDINGS: Enlargement of cardiac silhouette. Mediastinal contours and pulmonary vascularity normal. Opacity at medial RIGHT lung base could represent atelectasis or infiltrate. Remaining lungs clear. No pleural effusion or pneumothorax. Bones unremarkable. Lungs otherwise clear IMPRESSION: Enlargement of cardiac silhouette. Opacity at the medial RIGHT lung base question atelectasis versus infiltrate RIGHT middle lobe. Electronically Signed   By: Ulyses Southward M.D.   On: 09/19/2017 18:43    09/21/2017 echo:- Left ventricle: The cavity size was normal. There was mild   concentric hypertrophy. Systolic function was vigorous. The   estimated ejection fraction was in the range of 65% to 70%. Wall   motion was normal; there were  no regional wall motion   abnormalities. Doppler parameters are consistent with abnormal   left ventricular relaxation (grade 1 diastolic dysfunction).   There was no evidence of elevated ventricular filling pressure by   Doppler parameters. - Ascending aorta: The ascending aorta was normal in size. - Mitral valve: There was trivial regurgitation. - Right atrium: The atrium was normal in size. - Pulmonic valve: There was trivial regurgitation. - Pulmonary arteries: Systolic pressure was mildly increased. PA   peak pressure: 39 mm Hg (S). - Inferior vena cava: The vessel was normal in size. - Pericardium, extracardiac: There was no pericardial effusion.   Subjective:   Discharge Exam: Vitals:   09/21/17 0528 09/21/17 0753  BP: 139/85   Pulse: 75   Resp: 16   Temp: 98.8  F (37.1 C)   SpO2: 97% (!) 86%   Vitals:   09/20/17 2009 09/20/17 2053 09/21/17 0528 09/21/17 0753  BP:  (!) 142/76 139/85   Pulse:  84 75   Resp:  18 16   Temp:  99.6 F (37.6 C) 98.8 F (37.1 C)   TempSrc:  Oral Oral   SpO2: 93% 95% 97% (!) 86%  Weight:      Height:        General exam: Appears calm and comfortable  Respiratory system: Clear to auscultation. Respiratory effort normal.  Scattered rhonchi on 2 L nasal cannula Cardiovascular system: Regular rate and rhythm, no murmurs Gastrointestinal system: Abdomen is nondistended, soft and nontender. No organomegaly or masses felt. Normal bowel sounds heard. Central nervous system: Alert and oriented. No focal neurological deficits. Extremities: No lower extremity edema Skin: No rashes on visible skin Psychiatry: Judgement and insight appear normal. Mood & affect appropriate.     The results of significant diagnostics from this hospitalization (including imaging, microbiology, ancillary and laboratory) are listed below for reference.     Microbiology: Recent Results (from the past 240 hour(s))  Blood culture (routine x 2)     Status: None  (Preliminary result)   Collection Time: 09/19/17  6:45 PM  Result Value Ref Range Status   Specimen Description   Final    BLOOD RIGHT ANTECUBITAL Performed at [facility], [address] [place], [place] [address]    Special Requests   Final    BOTTLES DRAWN AEROBIC AND ANAEROBIC Blood Culture adequate volume Performed at [facility], [address] [place], [place] [address]    Culture   Final    NO GROWTH < 24 HOURS Performed at [facility], [address], [place], [place] [address]    Report Status PENDING  Incomplete  Blood culture (routine x 2)     Status: None (Preliminary result)   Collection Time: 09/19/17  7:10 PM  Result Value Ref Range Status   Specimen Description   Final    BLOOD RIGHT FOREARM Performed at [facility], [address] [place], [place] [address]    Special Requests   Final    BOTTLES DRAWN AEROBIC AND ANAEROBIC Blood Culture adequate volume Performed at [facility], [address] [place], [place] [address]    Culture   Final    NO GROWTH < 24 HOURS Performed at [facility], [address] [place], [place] [address]    Report Status PENDING  Incomplete  Culture, sputum-assessment     Status: None   Collection Time: 09/20/17  5:13 AM  Result Value Ref Range Status   Specimen Description SPUTUM  Final   Special Requests NONE  Final   Sputum evaluation   Final    THIS SPECIMEN IS ACCEPTABLE FOR SPUTUM CULTURE Performed at [facility], [address], [place], [place] [address]    Report Status 09/20/2017 FINAL  Final  Culture, respiratory (NON-Expectorated)     Status: None (Preliminary result)   Collection Time: 09/20/17  5:13 AM  Result Value Ref Range Status   Specimen Description   Final    SPUTUM Performed at [facility], [address], [place], [place] [address]    Special Requests   Final    NONE Reflexed from [id number] Performed  at [facility], [address] [place], [place]  [id number]    Gram Stain   Final    ABUNDANT WBC PRESENT, PREDOMINANTLY PMN RARE SQUAMOUS EPITHELIAL CELLS PRESENT ABUNDANT GRAM POSITIVE COCCI IN CHAINS    Culture   Final    CULTURE REINCUBATED FOR BETTER GROWTH Performed at [facility], [address] [place], [place] [address]    Report Status PENDING  Incomplete     Labs: BNP (last 3 results) No results for input(s): BNP in the last 8760 hours. Basic Metabolic Panel: Recent Labs  Lab 09/19/17 1849  NA 138  K 3.6  CL 104  CO2 22  GLUCOSE 126*  BUN 13  CREATININE 1.13  CALCIUM 8.8*   Liver Function Tests: No results for input(s): AST, ALT, ALKPHOS, BILITOT, PROT, ALBUMIN in the last 168 hours. No results for input(s): LIPASE, AMYLASE in the last 168 hours. No results for input(s): AMMONIA in the last 168 hours. CBC: Recent Labs  Lab 09/19/17 1849  WBC 8.3  HGB 14.2  HCT 42.2  MCV 92.7  PLT 272   Cardiac Enzymes: No results for input(s): CKTOTAL, CKMB, CKMBINDEX, TROPONINI in the last 168 hours. BNP: Invalid input(s): POCBNP CBG: No results for input(s): GLUCAP in the last 168 hours. D-Dimer No results for input(s): DDIMER in the last 72 hours. Hgb A1c No results for input(s): HGBA1C in the last 72 hours. Lipid Profile No results for input(s): CHOL, HDL, LDLCALC, TRIG, CHOLHDL, LDLDIRECT in the last 72 hours. Thyroid function studies No results for input(s): TSH, T4TOTAL, T3FREE, THYROIDAB in the last 72 hours.  Invalid input(s): FREET3 Anemia work up No results for input(s): VITAMINB12, FOLATE, FERRITIN, TIBC, IRON, RETICCTPCT in the last 72 hours. Urinalysis    Component Value Date/Time   COLORURINE YELLOW 09/20/2017 0513   APPEARANCEUR CLEAR 09/20/2017 0513   LABSPEC 1.027 09/20/2017 0513   PHURINE 5.0 09/20/2017 0513   GLUCOSEU NEGATIVE 09/20/2017 0513   HGBUR SMALL (A) 09/20/2017 0513   BILIRUBINUR NEGATIVE  09/20/2017 0513   KETONESUR 20 (A) 09/20/2017 0513   PROTEINUR NEGATIVE 09/20/2017 0513   NITRITE NEGATIVE 09/20/2017 0513   LEUKOCYTESUR MODERATE (A) 09/20/2017 0513   Sepsis Labs Invalid input(s): PROCALCITONIN,  WBC,  LACTICIDVEN Microbiology Recent Results (from the past 240 hour(s))  Blood culture (routine x 2)     Status: None (Preliminary result)   Collection Time: 09/19/17  6:45 PM  Result Value Ref Range Status   Specimen Description   Final    BLOOD RIGHT ANTECUBITAL Performed at [facility], [address] [facility] [address] [place], [place] [address]    Special Requests   Final    BOTTLES DRAWN AEROBIC AND ANAEROBIC Blood Culture adequate volume Performed at [facility], [address] [place], [place] [address]    Culture   Final    NO GROWTH < 24 HOURS Performed at [facility], [address] [place], [place] [address]    Report Status PENDING  Incomplete  Blood culture (routine x 2)     Status: None (Preliminary result)   Collection Time: 09/19/17  7:10 PM  Result Value Ref Range Status   Specimen Description   Final    BLOOD RIGHT FOREARM Performed at [facility], [address] [place], [place] [address]    Special Requests   Final    BOTTLES DRAWN AEROBIC AND ANAEROBIC Blood Culture adequate volume Performed at [facility], [address] [place], [place] [address]  Culture   Final    NO GROWTH < 24 HOURS Performed at [facility], [address] [place], [place] [address]    Report Status PENDING  Incomplete  Culture, sputum-assessment     Status: None   Collection Time: 09/20/17  5:13 AM  Result Value Ref Range Status   Specimen Description SPUTUM  Final   Special Requests NONE  Final   Sputum evaluation   Final    THIS SPECIMEN IS ACCEPTABLE FOR SPUTUM CULTURE Performed at [facility], [address], [place], [place] [address]    Report Status 09/20/2017 FINAL  Final   Culture, respiratory (NON-Expectorated)     Status: None (Preliminary result)   Collection Time: 09/20/17  5:13 AM  Result Value Ref Range Status   Specimen Description   Final    SPUTUM Performed at [facility], [address], [place], [place] [address]    Special Requests   Final    NONE Reflexed from [id number] Performed at [facility], [address] [place], [place] [address]    Gram Stain   Final    ABUNDANT WBC PRESENT, PREDOMINANTLY PMN RARE SQUAMOUS EPITHELIAL CELLS PRESENT ABUNDANT GRAM POSITIVE COCCI IN CHAINS    Culture   Final    CULTURE REINCUBATED FOR BETTER GROWTH Performed at [facility], [address], [place], [place] [address]    Report Status PENDING  Incomplete     Time coordinating discharge: Over 30 minutes  SIGNED:   Delaine Lame, MD  [facility] 09/21/2017, 1:00 PM   If 7PM-7AM, please contact night-coverage [email or web address] Password [email or web address]

## 2017-09-21 NOTE — Progress Notes (Signed)
SATURATION QUALIFICATIONS: (This note is used to comply with regulatory documentation for home oxygen)  Patient Saturations on Room Air at Rest = 88%  Patient Saturations on Room Air while Ambulating = 86%  Patient Saturations on 2 Liters of oxygen while Ambulating = 93%  Please briefly explain why patient needs home oxygen: Pat desats below 90 on rest and with exertion   Adrian ConroyBrooke M. Clelia CroftShaw, RN

## 2017-09-21 NOTE — Evaluation (Signed)
Physical Therapy Evaluation Patient Details Name: Adrian Rodgers MRN: [id number] DOB: February 16, 1941 Today's Date: 09/21/2017   History of Present Illness  77 year old with past medical history relevant for hyperlipidemia admitted with fevers and weakness and found to have sepsis secondary to right middle lobe pneumonia  Clinical Impression  Patient evaluated by Physical Therapy with no further acute PT needs identified. All education has been completed and the patient has no further questions.  See below for any follow-up Physical Therapy or equipment needs. PT is signing off. Thank you for this referral.     Follow Up Recommendations No PT follow up    Equipment Recommendations  None recommended by PT    Recommendations for Other Services       Precautions / Restrictions Precautions Precautions: None Restrictions Weight Bearing Restrictions: No      Mobility  Bed Mobility Overal bed mobility: Modified Independent                Transfers Overall transfer level: Independent                  Ambulation/Gait Ambulation/Gait assistance: Independent Ambulation Distance (Feet): 130 Feet Assistive device: None Gait Pattern/deviations: Step-through pattern;WFL(Within Functional Limits)     General Gait Details: no LOB, steady gait; O2 sats 92-96% on RA (at rest on lower end incr O2 sats with amb)  Stairs            Wheelchair Mobility    Modified Rankin (Stroke Patients Only)       Balance Overall balance assessment: No apparent balance deficits (not formally assessed)                                           Pertinent Vitals/Pain Pain Assessment: No/denies pain    Home Living Family/patient expects to be discharged to:: Private residence Living Arrangements: Alone   Type of Home: House Home Access: Level entry     Home Layout: One level Home Equipment: None Additional Comments: pt enjoys fishing and living by himself  so he can do as he pleases    Prior Function Level of Independence: Independent               Hand Dominance        Extremity/Trunk Assessment   Upper Extremity Assessment Upper Extremity Assessment: Overall WFL for tasks assessed    Lower Extremity Assessment Lower Extremity Assessment: Overall WFL for tasks assessed       Communication   Communication: No difficulties  Cognition Arousal/Alertness: Awake/alert Behavior During Therapy: WFL for tasks assessed/performed Overall Cognitive Status: Within Functional Limits for tasks assessed                                        General Comments      Exercises     Assessment/Plan    PT Assessment Patent does not need any further PT services  PT Problem List         PT Treatment Interventions      PT Goals (Current goals can be found in the Care Plan section)  Acute Rehab PT Goals Patient Stated Goal: get better PT Goal Formulation: All assessment and education complete, DC therapy    Frequency     Barriers to discharge  Co-evaluation               AM-PAC PT "6 Clicks" Daily Activity  Outcome Measure Difficulty turning over in bed (including adjusting bedclothes, sheets and blankets)?: None Difficulty moving from lying on back to sitting on the side of the bed? : None Difficulty sitting down on and standing up from a chair with arms (e.g., wheelchair, bedside commode, etc,.)?: None Help needed moving to and from a bed to chair (including a wheelchair)?: None Help needed walking in hospital room?: None Help needed climbing 3-5 steps with a railing? : None 6 Click Score: 24    End of Session Equipment Utilized During Treatment: Gait belt Activity Tolerance: Patient tolerated treatment well Patient left: in bed;with call bell/phone within reach Nurse Communication: Mobility status PT Visit Diagnosis: Difficulty in walking, not elsewhere classified (R26.2)    Time:  1610-9604 PT Time Calculation (min) (ACUTE ONLY): 10 min   Charges:   PT Evaluation $PT Eval Low Complexity: 1 Low     PT G CodesDrucilla Chalet, PT Pager: [phone] 09/21/2017   [facility] 09/21/2017, 12:47 PM

## 2017-09-21 NOTE — Discharge Instructions (Signed)

## 2017-09-21 NOTE — Progress Notes (Signed)
  Echocardiogram 2D Echocardiogram has been performed.  Adrian SkeenVijay  [name] 09/21/2017, 8:46 AM

## 2017-09-22 LAB — CULTURE, RESPIRATORY W GRAM STAIN: Culture: NORMAL

## 2017-09-24 LAB — CULTURE, BLOOD (ROUTINE X 2)
CULTURE: NO GROWTH
CULTURE: NO GROWTH
SPECIAL REQUESTS: ADEQUATE
SPECIAL REQUESTS: ADEQUATE

## 2020-05-01 DIAGNOSIS — I639 Cerebral infarction, unspecified: Secondary | ICD-10-CM

## 2020-05-01 HISTORY — DX: Cerebral infarction, unspecified: I63.9

## 2020-05-12 ENCOUNTER — Observation Stay (HOSPITAL_BASED_OUTPATIENT_CLINIC_OR_DEPARTMENT_OTHER)
Admission: EM | Admit: 2020-05-12 | Discharge: 2020-05-13 | Disposition: A | Discharge status: Home / Self Care | Payer: Medicare Other | Attending: Family Medicine | Admitting: Family Medicine

## 2020-05-12 ENCOUNTER — Emergency Department (HOSPITAL_BASED_OUTPATIENT_CLINIC_OR_DEPARTMENT_OTHER): Payer: Medicare Other

## 2020-05-12 ENCOUNTER — Encounter (HOSPITAL_BASED_OUTPATIENT_CLINIC_OR_DEPARTMENT_OTHER): Payer: Self-pay | Admitting: Emergency Medicine

## 2020-05-12 ENCOUNTER — Emergency Department (HOSPITAL_COMMUNITY): Payer: Medicare Other

## 2020-05-12 ENCOUNTER — Other Ambulatory Visit: Payer: Self-pay

## 2020-05-12 DIAGNOSIS — F17200 Nicotine dependence, unspecified, uncomplicated: Secondary | ICD-10-CM | POA: Diagnosis not present

## 2020-05-12 DIAGNOSIS — Z79899 Other long term (current) drug therapy: Secondary | ICD-10-CM | POA: Insufficient documentation

## 2020-05-12 DIAGNOSIS — I639 Cerebral infarction, unspecified: Principal | ICD-10-CM | POA: Insufficient documentation

## 2020-05-12 DIAGNOSIS — I10 Essential (primary) hypertension: Secondary | ICD-10-CM | POA: Diagnosis present

## 2020-05-12 DIAGNOSIS — M6281 Muscle weakness (generalized): Secondary | ICD-10-CM | POA: Diagnosis present

## 2020-05-12 DIAGNOSIS — Z20822 Contact with and (suspected) exposure to covid-19: Secondary | ICD-10-CM | POA: Diagnosis not present

## 2020-05-12 DIAGNOSIS — E785 Hyperlipidemia, unspecified: Secondary | ICD-10-CM | POA: Diagnosis present

## 2020-05-12 DIAGNOSIS — R531 Weakness: Secondary | ICD-10-CM

## 2020-05-12 DIAGNOSIS — Z8673 Personal history of transient ischemic attack (TIA), and cerebral infarction without residual deficits: Secondary | ICD-10-CM | POA: Diagnosis present

## 2020-05-12 HISTORY — DX: Essential (primary) hypertension: I10

## 2020-05-12 LAB — CBC WITH DIFFERENTIAL/PLATELET
Abs Immature Granulocytes: 0.01 10*3/uL (ref 0.00–0.07)
Basophils Absolute: 0 10*3/uL (ref 0.0–0.1)
Basophils Relative: 1 %
Eosinophils Absolute: 0.2 10*3/uL (ref 0.0–0.5)
Eosinophils Relative: 4 %
HCT: 44 % (ref 39.0–52.0)
Hemoglobin: 14.4 g/dL (ref 13.0–17.0)
Immature Granulocytes: 0 %
Lymphocytes Relative: 25 %
Lymphs Abs: 1.4 10*3/uL (ref 0.7–4.0)
MCH: 30.8 pg (ref 26.0–34.0)
MCHC: 32.7 g/dL (ref 30.0–36.0)
MCV: 94.2 fL (ref 80.0–100.0)
Monocytes Absolute: 0.4 10*3/uL (ref 0.1–1.0)
Monocytes Relative: 8 %
Neutro Abs: 3.5 10*3/uL (ref 1.7–7.7)
Neutrophils Relative %: 62 %
Platelets: 338 10*3/uL (ref 150–400)
RBC: 4.67 MIL/uL (ref 4.22–5.81)
RDW: 13.8 % (ref 11.5–15.5)
WBC: 5.6 10*3/uL (ref 4.0–10.5)
nRBC: 0 % (ref 0.0–0.2)

## 2020-05-12 LAB — COMPREHENSIVE METABOLIC PANEL
ALT: 15 U/L (ref 0–44)
AST: 9 U/L — ABNORMAL LOW (ref 15–41)
Albumin: 3.7 g/dL (ref 3.5–5.0)
Alkaline Phosphatase: 59 U/L (ref 38–126)
Anion gap: 10 (ref 5–15)
BUN: 18 mg/dL (ref 8–23)
CO2: 26 mmol/L (ref 22–32)
Calcium: 9.1 mg/dL (ref 8.9–10.3)
Chloride: 106 mmol/L (ref 98–111)
Creatinine, Ser: 1.06 mg/dL (ref 0.61–1.24)
GFR, Estimated: >60 mL/min (ref >60)
Glucose, Bld: 98 mg/dL (ref 70–99)
Potassium: 3.5 mmol/L (ref 3.5–5.1)
Sodium: 142 mmol/L (ref 135–145)
Total Bilirubin: 0.6 mg/dL (ref 0.3–1.2)
Total Protein: 7.1 g/dL (ref 6.5–8.1)

## 2020-05-12 LAB — RESPIRATORY PANEL BY RT PCR (FLU A&B, COVID)
Influenza A by PCR: NEGATIVE
Influenza B by PCR: NEGATIVE
SARS Coronavirus 2 by RT PCR: NEGATIVE

## 2020-05-12 MED ORDER — CLOPIDOGREL BISULFATE 75 MG PO TABS
75.0000 mg | ORAL_TABLET | Freq: Every day | ORAL | Status: DC
  Administered 2020-05-12 – 2020-05-13 (×2): 75 mg via ORAL
  Filled 2020-05-12 (×2): qty 1

## 2020-05-12 MED ORDER — ASPIRIN 325 MG PO TABS
325.0000 mg | ORAL_TABLET | Freq: Every day | ORAL | Status: DC
  Administered 2020-05-12: 325 mg via ORAL
  Filled 2020-05-12: qty 1

## 2020-05-12 NOTE — ED Notes (Signed)
Call to [facility] ED to provide report for ED to ED transfer for MRI, Spoke to [place] RN charge.  Accepting ED physician Dr Madilyn Hook

## 2020-05-12 NOTE — Consult Note (Signed)
NEUROLOGY CONSULTATION NOTE   Date of service: May 12, 2020 Patient Name: Adrian Rodgers MRN:  [id number] DOB:  [birth year] Reason for consult: "Stroke on MRI"  History of Present Illness  Adrian Rodgers is a 79 y.o. male with PMH significant for HTN, HLD who presents with left leg weakness. He reports to me that he went to bed at 1800 on 05/11/20 and woke up at 0900 on 05/12/20 with Left leg weakness. Could not walk. When daughter saw this, she brought him to the ED. He had MRI Brain without contrast which demonstrated multiple small R ACA territory infarcts.  No prior hx of strokes, does not take aspirin at home. He endorses smoking.  NIHSS: 1 for LLE drift MRS: 0 LKW: 1800 on 05/11/20. TPA: outside window Thrombectomy: outside window   ROS   Constitutional Denies weight loss, fever and chills.   HEENT Denies changes in vision and hearing.   Respiratory Denies SOB and cough.   CV Denies palpitations and CP   GI Denies abdominal pain, nausea, vomiting and diarrhea.   GU Denies dysuria and urinary frequency.   MSK Denies myalgia and joint pain.   Skin Denies rash and pruritus.   Neurological Denies headache and syncope.   Psychiatric Denies recent changes in mood. Denies anxiety and depression.    Past History   Past Medical History:  Diagnosis Date  . High cholesterol   . Hypertension    History reviewed. No pertinent surgical history. No family history on file. Social History   Socioeconomic History  . Marital status: Divorced    Spouse name: Not on file  . Number of children: Not on file  . Years of education: Not on file  . Highest education level: Not on file  Occupational History  . Not on file  Tobacco Use  . Smoking status: Current Every Day Smoker  . Smokeless tobacco: Never Used  Substance and Sexual Activity  . Alcohol use: Never  . Drug use: Never  . Sexual activity: Not on file  Other Topics Concern  . Not on file  Social History Narrative   . Not on file   Social Determinants of Health   Financial Resource Strain:   . Difficulty of Paying Living Expenses: Not on file  Food Insecurity:   . Worried About Programme researcher, broadcasting/film/video in the Last Year: Not on file  . Ran Out of Food in the Last Year: Not on file  Transportation Needs:   . Lack of Transportation (Medical): Not on file  . Lack of Transportation (Non-Medical): Not on file  Physical Activity:   . Days of Exercise per Week: Not on file  . Minutes of Exercise per Session: Not on file  Stress:   . Feeling of Stress : Not on file  Social Connections:   . Frequency of Communication with Friends and Family: Not on file  . Frequency of Social Gatherings with Friends and Family: Not on file  . Attends Religious Services: Not on file  . Active Member of Clubs or Organizations: Not on file  . Attends Banker Meetings: Not on file  . Marital Status: Not on file   No Known Allergies  Medications  (Not in a hospital admission)    Vitals   Vitals:   05/12/20 1519 05/12/20 1645 05/12/20 1827 05/12/20 1927  BP: (!) 151/84 (!) 153/93 (!) 147/85 134/86  Pulse: 71 87 78 66  Resp: 16 18 15 16   Temp: 98 F (36.7  C) 98.5 F (36.9 C)    TempSrc: Oral Oral    SpO2: 96% 94% 99% 99%  Weight:      Height:         Body mass index is 24.82 kg/m.  Physical Exam   General: Laying comfortably in bed; in no acute distress.  HENT: Normal oropharynx and mucosa. Normal external appearance of ears and nose.  Neck: Supple, no pain or tenderness  CV: No JVD. No peripheral edema.  Pulmonary: Symmetric Chest rise. Normal respiratory effort.  Abdomen: Soft to touch, non-tender.  Ext: No cyanosis, edema, or deformity  Skin: No rash. Normal palpation of skin.   Musculoskeletal: Normal digits and nails by inspection. No clubbing.   Neurologic Examination  Mental status/Cognition: Alert, oriented to self, place, month and year, good attention. Speech/language: Fluent,  comprehension intact, object naming intact, repetition intact. Cranial nerves:   CN II Pupils equal and reactive to light, no VF deficits    CN III,IV,VI EOM intact, no gaze preference or deviation, no nystagmus    CN V normal sensation in V1, V2, and V3 segments bilaterally    CN VII no asymmetry, no nasolabial fold flattening    CN VIII normal hearing to speech    CN IX & X normal palatal elevation, no uvular deviation    CN XI 5/5 head turn and 5/5 shoulder shrug bilaterally    CN XII midline tongue protrusion    Motor:  Muscle bulk: normal, tone normal, pronator drift none tremor none Mvmt Root Nerve  Muscle Right Left Comments  SA C5/6 Ax Deltoid 5 5   EF C5/6 Mc Biceps 5 5   EE C6/7/8 Rad Triceps 5 5   WF C6/7 Med FCR 5 5   WE C7/8 PIN ECU 5 5   F Ab C8/T1 U ADM/FDI 5 5   HF L1/2/3 Fem Illopsoas 5 4+   KE L2/3/4 Fem Quad 5 4+   DF L4/5 D Peron Tib Ant 5 5   PF S1/2 Tibial Grc/Sol 5 5    Reflexes:  Right Left Comments  Pectoralis      Biceps (C5/6) 1 1   Brachioradialis (C5/6) 1 1    Triceps (C6/7) 1 1    Patellar (L3/4) 1 1    Achilles (S1) 1 1    Hoffman      Plantar     Jaw jerk    Sensation:  Light touch Intact throughout   Pin prick    Temperature    Vibration   Proprioception    Coordination/Complex Motor:  - Finger to Nose intact BL - Heel to shin intact BL - Rapid alternating movement are normal - Gait: deferred.  Labs   CBC:  Recent Labs  Lab 05/12/20 1409  WBC 5.6  NEUTROABS 3.5  HGB 14.4  HCT 44.0  MCV 94.2  PLT 338    Basic Metabolic Panel:  Lab Results  Component Value Date   NA 142 05/12/2020   K 3.5 05/12/2020   CO2 26 05/12/2020   GLUCOSE 98 05/12/2020   BUN 18 05/12/2020   CREATININE 1.06 05/12/2020   CALCIUM 9.1 05/12/2020   GFRNONAA >60 05/12/2020   GFRAA >60 09/19/2017   Lipid Panel: No results found for: LDLCALC HgbA1c: No results found for: HGBA1C Urine Drug Screen: No results found for: LABOPIA, COCAINSCRNUR,  LABBENZ, AMPHETMU, THCU, LABBARB  Alcohol Level No results found for: [facility]   Results for orders placed during the hospital encounter of  05/12/20  MR BRAIN WO CONTRAST IMPRESSION: 1. 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  2. No associated hemorrhage or mass effect. No other acute intracranial abnormality. 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   Impression   Adrian Rodgers is a 79 y.o. male with PMH significant for HTN, HLD who presents with left leg weakness. His neurologic examination is notable for LLE weakness. MRI Brain with a R ACA stroke.  Will get stroke workup  Recommendations  Plan:  Recommend that primary team order following: - Frequent Neuro checks per stroke unit protocol - Recommend brain imaging with MRI Brain without contrast - Recommend Vascular imaging with MRA Angio Head without contrast and US Carotid doppler - Recommend obtaining TTE - Recommend obtaining Lipid panel with LDL - Please start statin if LDL > 70 - Recommend HbA1c - Antithrombotic - Aspirin 81mg  and Plavix 75mg  daily x 3 weeks, followed by Aspirin 81mg  daily. - Recommend DVT ppx - SBP goal - permissive hypertension first 24 h < 220/110. Held home meds.  - Recommend Telemetry monitoring for arrythmia - Recommend bedside swallow screen prior to PO intake. - Stroke education booklet - Recommend PT/OT/SLP consult  ______________________________________________________________________   Thank you for the opportunity to take part in the care of this patient. If you have any further questions, please contact the neurology consultation attending.  Signed,  [facility] Pager Number 

## 2020-05-12 NOTE — ED Notes (Signed)
Pt currently still in MRI. Will document a set of vitals as soon as he returns.

## 2020-05-12 NOTE — ED Notes (Signed)
Will get vitals when pt returns.

## 2020-05-12 NOTE — ED Provider Notes (Signed)
[facility] EMERGENCY DEPARTMENT Provider Note   CSN: [id number] Arrival date & time: 05/12/20  1157     History Chief Complaint  Patient presents with  . Weakness  . bilateral leg weakness    Adrian Rodgers is a 79 y.o. male.  HPI Patient presents for leg weakness.  Reportedly has had leg weakness for the last week.  Reportedly getting worse.  Has had 2 falls because of it.  No neck pain.  No new back pain but does have some chronic back pain.  Daughter states she came to see him today and he answer the door on his knees because he could not walk.  States he feels as if his left leg just gives out.  States both legs feel weak but particularly the left one.  States does have decreased sensation on the lower leg.  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  No headache.    Past Medical History:  Diagnosis Date  . High cholesterol   . Hypertension     Patient Active Problem List   Diagnosis Date Noted  . Community acquired pneumonia of right middle lobe of lung 09/20/2017  . Sepsis (HCC) 09/19/2017    History reviewed. No pertinent surgical history.     No family history on file.  Social History   Tobacco Use  . Smoking status: Current Every Day Smoker  . Smokeless tobacco: Never Used  Substance Use Topics  . Alcohol use: Never  . Drug use: Never    Home Medications Prior to Admission medications   Medication Sig Start Date End Date Taking? Authorizing Provider  albuterol (PROVENTIL HFA;VENTOLIN HFA) 108 (90 Base) MCG/ACT inhaler Inhale 2 puffs into the lungs every 6 (six) hours as needed for wheezing or shortness of breath. 09/21/17   Purohit, Salli Quarry, MD  atorvastatin (LIPITOR) 40 MG tablet Take 40 mg by mouth daily.    [provider]  cefdinir (OMNICEF) 300 MG capsule Take 1 capsule (300 mg total) by mouth 2 (two) times daily. 09/21/17   Purohit, Salli Quarry, MD  guaiFENesin-dextromethorphan (ROBITUSSIN DM)  100-10 MG/5ML syrup Take 5 mLs by mouth every 4 (four) hours as needed for cough. 09/21/17   Purohit, Salli Quarry, MD  Spacer/Aero Chamber Mouthpiece MISC 1 Units by Does not apply route every 4 (four) hours as needed. 09/21/17   Purohit, Salli Quarry, MD    Allergies    Patient has no known allergies.  Review of Systems   Review of Systems  Constitutional: Negative for appetite change.  HENT: Negative for congestion.   Respiratory: Negative for shortness of breath.   Cardiovascular: Negative for chest pain.  Gastrointestinal: Negative for abdominal pain.  Genitourinary: Negative for enuresis.  Musculoskeletal: Positive for back pain.  Skin: Negative for rash.  Neurological: Positive for weakness. Negative for numbness and headaches.  Psychiatric/Behavioral: Negative for confusion.    Physical Exam Updated Vital Signs BP (!) 142/85   Pulse 73   Temp 98 F (36.7 C) (Oral)   Resp 16   Ht 6' (1.829 m)   Wt 83 kg   SpO2 95%   BMI 24.82 kg/m   Physical Exam Vitals and nursing note reviewed.  HENT:     Head: Normocephalic.  Eyes:     Extraocular Movements: Extraocular movements intact.     Pupils: Pupils are equal, round, and reactive to light.  Cardiovascular:     Rate and Rhythm: Regular rhythm.  Pulmonary:     Breath sounds: No wheezing or rhonchi.  Abdominal:     Tenderness: There is no abdominal tenderness.  Musculoskeletal:        General: No tenderness.     Cervical back: Normal range of motion.     Right lower leg: No edema.     Left lower leg: No edema.  Skin:    General: Skin is warm.     Capillary Refill: Capillary refill takes less than 2 seconds.  Neurological:     Mental Status: He is alert and oriented to person, place, and time.     Comments: Face symmetric.  Eye movement intact.  Good dressing bilateral.  Upper extremity seems symmetric and normal.  Somewhat decreased strength in left lower extremity.  Less strength with straight leg raise.  Less strength with  extension at the knee and less strength with extension and flexion at the ankle.  Pulse intact.  Sensation grossly intact.  Strength greater on right but states it does feel weak.  Good patellar reflex on left.     ED Results / Procedures / Treatments   Labs (all labs ordered are listed, but only abnormal results are displayed) Labs Reviewed  COMPREHENSIVE METABOLIC PANEL - Abnormal; Notable for the following components:      Result Value   AST 9 (*)    All other components within normal limits  RESPIRATORY PANEL BY RT PCR (FLU A&B, COVID)  CBC WITH DIFFERENTIAL/PLATELET  URINALYSIS, ROUTINE W REFLEX MICROSCOPIC    EKG None  Radiology CT Head Wo Contrast  Result Date: 05/12/2020 CLINICAL DATA:  Bilateral leg weakness, history of recent falls EXAM: CT HEAD WITHOUT CONTRAST TECHNIQUE: Contiguous axial images were obtained from the base of the skull through the vertex without intravenous contrast. COMPARISON:  None. FINDINGS: Brain: Chronic atrophic and white matter ischemic changes are noted. Basal ganglia calcifications are noted bilaterally. No findings to suggest acute hemorrhage, acute infarction or space-occupying mass lesion are noted. Vascular: No hyperdense vessel or unexpected calcification. Skull: Normal. Negative for fracture or focal lesion. Sinuses/Orbits: No acute finding. Other: None. IMPRESSION: Chronic atrophic and ischemic changes without acute abnormality. Electronically Signed   By: Alcide Clever M.D.   On: 05/12/2020 13:57   CT Lumbar Spine Wo Contrast  Result Date: 05/12/2020 CLINICAL DATA:  Status post fall x2 over the past week. Bilateral lower extremity weakness. EXAM: CT LUMBAR SPINE WITHOUT CONTRAST TECHNIQUE: Multidetector CT imaging of the lumbar spine was performed without intravenous contrast administration. Multiplanar CT image reconstructions were also generated. COMPARISON:  None. FINDINGS: Segmentation: Standard. Alignment: No listhesis. Mild convex right  scoliosis with the apex at L4-5. Vertebrae: No fracture or focal lesion. Paraspinal and other soft tissues: Small right renal cyst and scattered aortic atherosclerotic calcifications noted. Disc levels: T11-12: Negative. T12-L1: Negative. L1-2: Negative. L2-3: Shallow disc bulge and mild facet degenerative change. No stenosis. L3-4: Very shallow disc bulge and mild facet arthropathy. No stenosis. L4-5: Mild facet degenerative disease and a shallow disc bulge. Central canal is open. Scratch the central canal and right foramen are open. Mild left foraminal narrowing. L5-S1: Shallow disc bulge and mild facet degenerative change. No stenosis. IMPRESSION: No acute abnormality or finding to explain the patient's symptoms. Mild left foraminal narrowing at L4-5. The foramina are otherwise open at all levels. The central canal is patent throughout. Aortic Atherosclerosis (ICD10-I70.0). Electronically Signed   By: Drusilla Kanner M.D.   On: 05/12/2020 14:03    Procedures Procedures (  including critical care time)  Medications Ordered in ED Medications - No data to display  ED Course  I have reviewed the triage vital signs and the nursing notes.  Pertinent labs & imaging results that were available during my care of the patient were reviewed by me and considered in my medical decision making (see chart for details).    MDM Rules/Calculators/A&P                          Patient came in with left lower leg weakness.  Patient also states feels weak on the right.  Exam showed more weakness the left lower leg.  It was severe enough he had to crawl on his knees to get to the door.  Has been having for the last couple days.  No neck pain.  Does have some chronic back pain.  The back pain is in the lower back.  States he had falls from the weakness.  No cancer history.  Head CT and lumbar CT done reassuring.  However during this time the weakness has improved in the lower extremities.  Discussed with patient and his  daughter.  However with the severity of the weakness earlier I think patient benefit from a more expedited work-up.  Discussed with Dr. Madilyn Hook at [facility].  Will transfer for MRI of brain thoracic and lumbar spine.  After this if reassuring and exam still good should be able to go home.  Discussed with patient's daughter and she will drive him there. Final Clinical Impression(s) / ED Diagnoses Final diagnoses:  Weakness    Rx / DC Orders ED Discharge Orders    None       Benjiman Core, MD 05/12/20 1505

## 2020-05-12 NOTE — ED Notes (Signed)
Pt aware of need for urine specimen, provided container

## 2020-05-12 NOTE — ED Notes (Signed)
Patient transported to MRI 

## 2020-05-12 NOTE — ED Notes (Signed)
ED Provider at bedside. 

## 2020-05-12 NOTE — ED Triage Notes (Signed)
Pt daughter brought him in today for bilateral leg weakness. Daughter states he has had 2 unwitnessed falls this week. Pt states he feels like his legs "arent there". Denies LOC, during events or hitting head. Daughter states when she got to his house today and he was on his knees at the door. Pt AO x 4. No slurred speech, no arm weakness, no back pain. Denies blood thinners.

## 2020-05-12 NOTE — Plan of Care (Signed)
  Problem: Education: Goal: Knowledge of disease or condition will improve Outcome: Progressing Goal: Knowledge of secondary prevention will improve Outcome: Progressing Goal: Knowledge of patient specific risk factors addressed and post discharge goals established will improve Outcome: Progressing   Problem: Coping: Goal: Will verbalize positive feelings about self Outcome: Progressing Goal: Will identify appropriate support needs Outcome: Progressing   Problem: Health Behavior/Discharge Planning: Goal: Ability to manage health-related needs will improve Outcome: Progressing   Problem: Self-Care: Goal: Ability to participate in self-care as condition permits will improve Outcome: Progressing Goal: Verbalization of feelings and concerns over difficulty with self-care will improve Outcome: Progressing Goal: Ability to communicate needs accurately will improve Outcome: Progressing   Problem: Nutrition: Goal: Risk of aspiration will decrease Outcome: Progressing   

## 2020-05-13 ENCOUNTER — Observation Stay (HOSPITAL_BASED_OUTPATIENT_CLINIC_OR_DEPARTMENT_OTHER): Payer: Medicare Other

## 2020-05-13 ENCOUNTER — Encounter (HOSPITAL_COMMUNITY): Payer: Self-pay | Admitting: Internal Medicine

## 2020-05-13 ENCOUNTER — Observation Stay (HOSPITAL_COMMUNITY): Payer: Medicare Other

## 2020-05-13 DIAGNOSIS — I10 Essential (primary) hypertension: Secondary | ICD-10-CM | POA: Diagnosis present

## 2020-05-13 DIAGNOSIS — I6389 Other cerebral infarction: Secondary | ICD-10-CM

## 2020-05-13 DIAGNOSIS — E785 Hyperlipidemia, unspecified: Secondary | ICD-10-CM | POA: Diagnosis present

## 2020-05-13 DIAGNOSIS — I639 Cerebral infarction, unspecified: Secondary | ICD-10-CM | POA: Diagnosis not present

## 2020-05-13 LAB — CBC
HCT: 42 % (ref 39.0–52.0)
Hemoglobin: 13.6 g/dL (ref 13.0–17.0)
MCH: 30.3 pg (ref 26.0–34.0)
MCHC: 32.4 g/dL (ref 30.0–36.0)
MCV: 93.5 fL (ref 80.0–100.0)
Platelets: 314 10*3/uL (ref 150–400)
RBC: 4.49 MIL/uL (ref 4.22–5.81)
RDW: 13.6 % (ref 11.5–15.5)
WBC: 5.3 10*3/uL (ref 4.0–10.5)
nRBC: 0 % (ref 0.0–0.2)

## 2020-05-13 LAB — LIPID PANEL
Cholesterol: 124 mg/dL (ref 0–200)
HDL: 38 mg/dL — ABNORMAL LOW (ref >40)
LDL Cholesterol: 75 mg/dL (ref 0–99)
Total CHOL/HDL Ratio: 3.3 RATIO
Triglycerides: 53 mg/dL (ref <150)
VLDL: 11 mg/dL (ref 0–40)

## 2020-05-13 LAB — ECHOCARDIOGRAM COMPLETE
AR max vel: 2.26 cm2
AV Area VTI: 2.3 cm2
AV Area mean vel: 2.38 cm2
AV Mean grad: 2 mmHg
AV Peak grad: 3.5 mmHg
Ao pk vel: 0.93 m/s
Area-P 1/2: 6.96 cm2
Height: 72 in
S' Lateral: 2.4 cm
Weight: 2928 oz

## 2020-05-13 LAB — HEMOGLOBIN A1C
Hgb A1c MFr Bld: 6.2 % — ABNORMAL HIGH (ref 4.8–5.6)
Mean Plasma Glucose: 131.24 mg/dL

## 2020-05-13 LAB — CREATININE, SERUM
Creatinine, Ser: 0.77 mg/dL (ref 0.61–1.24)
GFR, Estimated: >60 mL/min (ref >60)

## 2020-05-13 MED ORDER — SODIUM CHLORIDE 0.9 % IV SOLN: INTRAVENOUS | Status: DC

## 2020-05-13 MED ORDER — ENOXAPARIN SODIUM 40 MG/0.4ML ~~LOC~~ SOLN
40.0000 mg | SUBCUTANEOUS | Status: DC
  Administered 2020-05-13: 40 mg via SUBCUTANEOUS
  Filled 2020-05-13: qty 0.4

## 2020-05-13 MED ORDER — ENOXAPARIN SODIUM 40 MG/0.4ML ~~LOC~~ SOLN: 40.0000 mg | SUBCUTANEOUS | Status: DC

## 2020-05-13 MED ORDER — ATORVASTATIN CALCIUM 80 MG PO TABS
80.0000 mg | ORAL_TABLET | Freq: Every day | ORAL | Status: DC
  Administered 2020-05-13: 80 mg via ORAL
  Filled 2020-05-13: qty 1

## 2020-05-13 MED ORDER — ACETAMINOPHEN 325 MG PO TABS: 650.0000 mg | ORAL_TABLET | ORAL | Status: DC | PRN

## 2020-05-13 MED ORDER — ACETAMINOPHEN 160 MG/5ML PO SOLN: 650.0000 mg | ORAL | Status: DC | PRN

## 2020-05-13 MED ORDER — IOHEXOL 350 MG/ML SOLN
75.0000 mL | Freq: Once | INTRAVENOUS | Status: AC | PRN
  Administered 2020-05-13: 75 mL via INTRAVENOUS

## 2020-05-13 MED ORDER — ASPIRIN EC 81 MG PO TBEC
81.0000 mg | DELAYED_RELEASE_TABLET | Freq: Every day | ORAL | Status: DC
  Administered 2020-05-13: 81 mg via ORAL
  Filled 2020-05-13: qty 1

## 2020-05-13 MED ORDER — ASPIRIN 81 MG PO TBEC
81.0000 mg | DELAYED_RELEASE_TABLET | Freq: Every day | ORAL | 11 refills | Status: DC
Start: 2020-05-14 — End: 2021-03-27

## 2020-05-13 MED ORDER — ACETAMINOPHEN 650 MG RE SUPP: 650.0000 mg | RECTAL | Status: DC | PRN

## 2020-05-13 MED ORDER — STROKE: EARLY STAGES OF RECOVERY BOOK
Freq: Once | Status: AC
  Filled 2020-05-13: qty 1

## 2020-05-13 MED ORDER — CLOPIDOGREL BISULFATE 75 MG PO TABS: 75.0000 mg | ORAL_TABLET | Freq: Every day | ORAL | 0 refills | Status: AC

## 2020-05-13 MED ORDER — LOSARTAN POTASSIUM 50 MG PO TABS: 50.0000 mg | ORAL_TABLET | Freq: Every day | ORAL | Status: DC

## 2020-05-13 NOTE — Evaluation (Signed)
Occupational Therapy Evaluation Patient Details Name: Adrian Rodgers MRN: [id number] DOB: 03-02-1941 Today's Date: 05/13/2020    History of Present Illness Adrian Rodgers is a 79 y.o. male with history of hypertension and hyperlipidemia was brought to the ER after patient had difficulty walking due to left leg weakness. 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 PMH: HTN, high cholesterol   Clinical Impression   This 79 y/o male presents with the above. PTA pt reports living alone, performing ADL, iADL and mobility tasks independently. Pt currently presenting with the above and below listed deficits. He is able to perform room level mobility tasks using RW overall with minguard assist, requiring up to minguard assist for LB ADL. Administered Short Blessed Test during session with pt receiving score of 9, indicative of "questionable impairment" with deficits noted included STM, processing. Pt to benefit from continued acute OT services, recommend he have initial 24hr supervision at time of discharge to ensure safety with ADL and mobility, however do not anticipate he will require additional follow up OT services after d/c home. Acute OT to follow.     Follow Up Recommendations  No OT follow up;Supervision/Assistance - 24 hour (24hr initially)    Equipment Recommendations  None recommended by OT           Precautions / Restrictions Precautions Precautions: Fall Restrictions Weight Bearing Restrictions: No      Mobility Bed Mobility Overal bed mobility: Modified Independent             General bed mobility comments: OOB in recliner     Transfers Overall transfer level: Needs assistance Equipment used: Rolling walker (2 wheeled) Transfers: Sit to/from Stand Sit to Stand: Min guard         General transfer comment: for safety, VCs for safe hand placement with transitions     Balance Overall balance assessment: Needs  assistance Sitting-balance support: Feet supported;No upper extremity supported Sitting balance-Leahy Scale: Good Sitting balance - Comments: able to reach down and pull socks up without LOB   Standing balance support: Single extremity supported;During functional activity Standing balance-Leahy Scale: Fair Standing balance comment: able to maintain static balance for short periods with minguard assist                            ADL either performed or assessed with clinical judgement   ADL Overall ADL's : Needs assistance/impaired Eating/Feeding: Modified independent;Sitting   Grooming: Supervision/safety;Standing;Wash/dry hands   Upper Body Bathing: Supervision/ safety;Sitting   Lower Body Bathing: Min guard;Sit to/from stand   Upper Body Dressing : Modified independent;Sitting   Lower Body Dressing: Min guard;Sit to/from stand   Toilet Transfer: Min guard;Ambulation;RW Toilet Transfer Details (indicate cue type and reason): simulated via transfer to/from recliner  Toileting- Architect and Hygiene: Min guard;Sit to/from stand       Functional mobility during ADLs: Min guard;Rolling walker       Vision   Vision Assessment?: No apparent visual deficits     Perception     Praxis      Pertinent Vitals/Pain Pain Assessment: No/denies pain Faces Pain Scale: Hurts whole lot Pain Location: L foot, pt reports having a callous and bone spur there, limiting amb tolerance Pain Descriptors / Indicators: Sharp Pain Intervention(s):  (gave RW for ambualtion to off-weight foot)     Hand Dominance Right   Extremity/Trunk Assessment Upper Extremity Assessment Upper Extremity Assessment: Overall WFL for  tasks assessed   Lower Extremity Assessment Lower Extremity Assessment: Defer to PT evaluation   Cervical / Trunk Assessment Cervical / Trunk Assessment: Normal   Communication Communication Communication: No difficulties   Cognition  Arousal/Alertness: Awake/alert Behavior During Therapy: WFL for tasks assessed/performed Overall Cognitive Status: Impaired/Different from baseline Area of Impairment: Memory                     Memory: Decreased recall of precautions         General Comments: administered Short Blessed Test with pt receiving a score of 9, indicative of "questionable impairment" - he may likely benefit from further cog assessment.    General Comments  SpO2 dec to 82% while amb on RA, pt with noted cough, pt states "Its form smoking, but I quit yesterday", once seated 93% SPO2 s/p 1 min    Exercises     Shoulder Instructions      Home Living Family/patient expects to be discharged to:: Private residence Living Arrangements: Alone Available Help at Discharge: Family;Available 24 hours/day (dtr can stay with him per patient) Type of Home: House Home Access: Stairs to enter [employer] of Steps: 5 Entrance Stairs-Rails: Right Home Layout: One level     Bathroom Shower/Tub: Chief Strategy Officer: Handicapped height     Home Equipment: None          Prior Functioning/Environment Level of Independence: Independent        Comments: drives, dtr has groceries delivered        OT Problem List: Decreased strength;Decreased range of motion;Decreased activity tolerance;Impaired balance (sitting and/or standing);Decreased cognition;Decreased knowledge of use of DME or AE;Pain      OT Treatment/Interventions: Self-care/ADL training;Therapeutic exercise;Energy conservation;DME and/or AE instruction;Therapeutic activities;Cognitive remediation/compensation;Patient/family education;Balance training    OT Goals(Current goals can be found in the care plan section) Acute Rehab OT Goals Patient Stated Goal: home today OT Goal Formulation: With patient Time For Goal Achievement: 05/27/20 Potential to Achieve Goals: Good  OT Frequency: Min 2X/week   Barriers to  D/C:            Co-evaluation              AM-PAC OT "6 Clicks" Daily Activity     Outcome Measure Help from another person eating meals?: None Help from another person taking care of personal grooming?: A Little Help from another person toileting, which includes using toliet, bedpan, or urinal?: A Little Help from another person bathing (including washing, rinsing, drying)?: A Little Help from another person to put on and taking off regular upper body clothing?: None Help from another person to put on and taking off regular lower body clothing?: A Little 6 Click Score: 20   End of Session Equipment Utilized During Treatment: Rolling walker Nurse Communication: Mobility status  Activity Tolerance: Patient tolerated treatment well Patient left: in chair;with call bell/phone within reach;with chair alarm set;with family/visitor present  OT Visit Diagnosis: Other symptoms and signs involving cognitive function;Other symptoms and signs involving the nervous system (R29.898);Other abnormalities of gait and mobility (R26.89)                Time: 6734-1937 OT Time Calculation (min): 23 min Charges:  OT General Charges $OT Visit: 1 Visit OT Evaluation $OT Eval Moderate Complexity: 1 Mod  Marcy Siren, OT Acute Rehabilitation Services Pager [phone] Office [phone]   Orlando Penner 05/13/2020, 11:07 AM

## 2020-05-13 NOTE — Discharge Summary (Signed)
Physician Discharge Summary  Adrian Rodgers SJG:[id number] DOB: [birth year] DOA: 05/12/2020  PCP: [facility]  Admit date: 05/12/2020 Discharge date: 05/13/2020  Admitted From: Home Disposition: Home  Recommendations for Outpatient Follow-up:  1. Follow up with PCP in 1-2 weeks 2. Follow-up with neurology 3. Please obtain BMP/CBC in one week 4. Please follow up with your PCP on the following pending results: Unresulted Labs (From admission, onward)          Start     Ordered   05/13/20 0503  Urinalysis, Routine w reflex microscopic  ONCE - STAT,   STAT        05/13/20 0502           Home Health: None Equipment/Devices: Bedside commode  Discharge Condition: Stable CODE STATUS: Full code Diet recommendation: Cardiac  Subjective: Seen and examined.  Feels much better.  Very minimal weakness left.  No other complaint.  HPI: Adrian Rodgers is a 79 y.o. male with history of hypertension and hyperlipidemia was brought to the ER after patient had difficulty walking due to left leg weakness.  Patient's weakness started on the morning of normal 06/19/2020 when he woke up from the sleep around 9 AM.  Denies any weakness of lower extremities or difficulty speaking or swallowing.  Has not had any previous stroke.  His daughter went to check on him and he found it difficult to open the door and his daughter found that patient was crawling on the floor to ambulate.  ED Course: In the ER patient appeared nonfocal.  Strength was back to normal.  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  EKG is pending.  Telemetry shows normal sinus rhythm.  On-call neurologist was consulted.  Patient passed swallow.  Admit for further stroke work-up.  Lab work was largely unremarkable.  Covid test was negative.  Brief/Interim Summary: Patient admitted with left leg weakness.  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  Detailed  report as below.  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  Patient was started on DAPT.  Neurology consulted.  They did not recommend any further intervention but recommended event monitor which Dr. Roda Shutters of neurology will arrange as outpatient.  They cleared the patient for discharge.  He was seen by PT OT and they did not recommend any further OT PT or any SNF or CIR.  Patient passed swallow evaluation and does not have any speech problem either.  His left leg weakness has improved and is very close to normal now.  Patient is being discharged in stable condition.  He will continue DAPT for 3 weeks and then stop Plavix but continue aspirin.  He also will continue statin.  Discharge Diagnoses:  Principal Problem:   Acute CVA (cerebrovascular accident) [facility]) Active Problems:   Essential hypertension   HLD (hyperlipidemia)    Discharge Instructions  Discharge Instructions    Ambulatory referral to Cardiac Electrophysiology   Complete by: As directed    Request Loop recorder placement. Thank you.   Ambulatory referral to Neurology   Complete by: As directed    Follow up with stroke clinic NP (Jessica Vanschaick or Darrol Angel, if both not available, consider Manson Allan, or A[facility]) at [facility] in about 4 weeks. Thanks.     Allergies as of 05/13/2020   No Known Allergies     Medication List    STOP taking these medications   cefdinir 300 MG capsule  Commonly known as: OMNICEF   guaiFENesin-dextromethorphan 100-10 MG/5ML syrup Commonly known as: ROBITUSSIN DM     TAKE these medications   acetaminophen 500 MG tablet Commonly known as: TYLENOL Take 500-1,000 mg by mouth every 8 (eight) hours as needed for mild pain (or headaches).   albuterol 108 (90 Base) MCG/ACT inhaler Commonly known as: VENTOLIN HFA Inhale 2 puffs into the lungs every 6 (six) hours as needed for wheezing or shortness of breath.   aspirin 81 MG EC tablet Take 1 tablet (81 mg total) by mouth  daily. Swallow whole. Start taking on: May 14, 2020   atorvastatin 40 MG tablet Commonly known as: LIPITOR Take 40 mg by mouth daily.   Centrum Silver 50+Men Tabs Take 1 tablet by mouth daily with breakfast.   clopidogrel 75 MG tablet Commonly known as: PLAVIX Take 1 tablet (75 mg total) by mouth daily for 21 days. Start taking on: May 14, 2020   losartan 50 MG tablet Commonly known as: COZAAR Take 50 mg by mouth daily.   Spacer/Aero Chamber Mouthpiece Misc 1 Units by Does not apply route every 4 (four) hours as needed.       Follow-up Information    [facility]. Schedule an appointment as soon as possible for a visit in 4 week(s).   Specialty: Neurology Contact information: [address] [place] [address] [phone]       Hillis Range, MD. Schedule an appointment as soon as possible for a visit in 2 week(s).   Specialty: Cardiology Contact information: [address] [place] [address] [phone]        [facility] Follow up in 1 week(s).   Specialty: Family Medicine Contact information: [address] [place] [address] [phone]              No Known Allergies  Consultations: Neurology   Procedures/Studies: CT ANGIO HEAD W OR WO CONTRAST  Result Date: 05/13/2020 CLINICAL DATA:  Right ACA territory infarctions. EXAM: CT ANGIOGRAPHY HEAD AND NECK TECHNIQUE: Multidetector CT imaging of the head and neck was performed using the standard protocol during bolus administration of intravenous contrast. Multiplanar CT image reconstructions and MIPs were obtained to evaluate the vascular anatomy. Carotid stenosis measurements (when applicable) are obtained utilizing NASCET criteria, using the distal internal carotid diameter as the denominator. CONTRAST:  75mL OMNIPAQUE IOHEXOL 350 MG/ML SOLN COMPARISON:  MR angiography same day.  MRI 05/12/2020. FINDINGS: CT  HEAD FINDINGS Brain: Age related atrophy. Chronic small-vessel ischemic changes of the cerebral hemispheric white matter. Physiologic calcification of the basal ganglia. Small acute infarctions in the right ACA territory difficult to appreciate by CT. No evidence of swelling or hemorrhage. No hydrocephalus or extra-axial collection. Vascular: There is atherosclerotic calcification of the major vessels at the base of the brain. Skull: Negative Sinuses: Clear Orbits: Normal Review of the MIP images confirms the above findings CTA NECK FINDINGS Aortic arch: Aortic atherosclerosis.  No aneurysm or dissection. Right carotid system: Common carotid artery is widely patent to the bifurcation. Carotid bifurcation does not show soft or calcified plaque. No ICA stenosis. Moderate ICA tortuosity. Left carotid system: Common carotid artery widely patent to the bifurcation. No atherosclerotic disease seen at the carotid bifurcation or ICA bulb. Pronounced cervical ICA tortuosity. 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 This suggests possibility of fibromuscular disease in this segment. 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 The vessel is widely patent. Right vertebral artery takes a typical origin and is widely patent through the cervical region. Skeleton: Ordinary cervical spondylosis. Other neck: No mass or lymphadenopathy. Upper chest: Emphysema. Pulmonary scarring. 9 mm region of ground-glass opacity in the left lung apex. Review of the MIP images confirms the above findings CTA HEAD FINDINGS Anterior circulation: Both internal carotid arteries are patent through the skull base and siphon regions. Ordinary siphon atherosclerotic calcification but no stenosis. Middle cerebral arteries are patent but do show distal vessel atherosclerotic irregularity. Anterior cerebral arteries are patent. 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 Posterior circulation: Both vertebral arteries are patent through the foramen magnum to the basilar. Some narrowing and atherosclerotic irregularity of the basilar artery but no stenosis greater than 30%. 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 Mild to moderate stenosis of the left P1 segment. Atherosclerotic irregularity of the more distal PCA branches. Venous sinuses: Patent and normal. Anatomic variants: None other significant. Review of the MIP images confirms the above findings IMPRESSION: 1. No significant carotid bifurcation disease. Tortuous cervical internal carotid arteries but without stenosis. 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 2. 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 3. 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 Mild to moderate stenosis of the left P1 segment. Atherosclerotic irregularity of the more distal PCA branches. 4. Small acute infarctions in the right ACA territory difficult to appreciate by CT. No evidence of hemorrhage or mass effect. 5. Emphysema. 9 mm region of ground-glass opacity in the left lung apex. Initial follow-up with CT at 6-12 months is recommended to confirm persistence. If persistent, repeat CT is recommended every 2 years until 5 years of stability has been established. This recommendation follows the consensus statement: Guidelines for Management of Incidental Pulmonary Nodules Detected on CT Images: From the Fleischner Society 2017; Radiology 2017; 284:228-243. Aortic Atherosclerosis (ICD10-I70.0) and Emphysema (ICD10-J43.9). Electronically Signed   By: Paulina Fusi M.D.   On:  05/13/2020 08:37   CT Head Wo Contrast  Result Date: 05/12/2020 CLINICAL DATA:  Bilateral leg weakness, history of recent falls EXAM: CT HEAD WITHOUT CONTRAST TECHNIQUE: Contiguous axial images were obtained from the base of the skull through the vertex without intravenous contrast. COMPARISON:  None. FINDINGS: Brain: Chronic atrophic and white matter ischemic changes are noted. Basal ganglia calcifications are noted bilaterally. No findings to suggest acute hemorrhage, acute infarction or space-occupying mass lesion are noted. Vascular: No hyperdense vessel or unexpected calcification. Skull: Normal. Negative for fracture or focal lesion. Sinuses/Orbits: No acute finding. Other: None. IMPRESSION: Chronic atrophic and ischemic changes without acute abnormality. Electronically Signed   By: Alcide Clever M.D.   On: 05/12/2020 13:57   CT ANGIO NECK W OR WO CONTRAST  Result Date: 05/13/2020 CLINICAL DATA:  Right ACA territory infarctions. EXAM: CT ANGIOGRAPHY HEAD AND NECK TECHNIQUE: Multidetector CT imaging of the head and neck was performed using the standard protocol during bolus administration of intravenous contrast. Multiplanar CT image reconstructions and MIPs were obtained to evaluate the vascular anatomy. Carotid stenosis measurements (when applicable) are obtained utilizing NASCET criteria, using the distal internal carotid diameter  as the denominator. CONTRAST:  75mL OMNIPAQUE IOHEXOL 350 MG/ML SOLN COMPARISON:  MR angiography same day.  MRI 05/12/2020. FINDINGS: CT HEAD FINDINGS Brain: Age related atrophy. Chronic small-vessel ischemic changes of the cerebral hemispheric white matter. Physiologic calcification of the basal ganglia. Small acute infarctions in the right ACA territory difficult to appreciate by CT. No evidence of swelling or hemorrhage. No hydrocephalus or extra-axial collection. Vascular: There is atherosclerotic calcification of the major vessels at the base of the brain. Skull:  Negative Sinuses: Clear Orbits: Normal Review of the MIP images confirms the above findings CTA NECK FINDINGS Aortic arch: Aortic atherosclerosis.  No aneurysm or dissection. Right carotid system: Common carotid artery is widely patent to the bifurcation. Carotid bifurcation does not show soft or calcified plaque. No ICA stenosis. Moderate ICA tortuosity. Left carotid system: Common carotid artery widely patent to the bifurcation. No atherosclerotic disease seen at the carotid bifurcation or ICA bulb. Pronounced cervical ICA tortuosity. 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 This suggests possibility of fibromuscular disease in this segment. 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 The vessel is widely patent. Right vertebral artery takes a typical origin and is widely patent through the cervical region. Skeleton: Ordinary cervical spondylosis. Other neck: No mass or lymphadenopathy. Upper chest: Emphysema. Pulmonary scarring. 9 mm region of ground-glass opacity in the left lung apex. Review of the MIP images confirms the above findings CTA HEAD FINDINGS Anterior circulation: Both internal carotid arteries are patent through the skull base and siphon regions. Ordinary siphon atherosclerotic calcification but no stenosis. Middle cerebral arteries are patent but do show distal vessel atherosclerotic irregularity. Anterior cerebral arteries are patent. Right pericallosal artery shows reduction in caliber and diminished flow compared to the left, probably secondary to previous embolic disease with recanalization. Posterior circulation: Both vertebral arteries are patent through the foramen magnum to the basilar. Some narrowing and atherosclerotic irregularity of the basilar artery but no stenosis greater than 30%. 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 Mild to moderate stenosis of the left P1 segment.  Atherosclerotic irregularity of the more distal PCA branches. Venous sinuses: Patent and normal. Anatomic variants: None other significant. Review of the MIP images confirms the above findings IMPRESSION: 1. No significant carotid bifurcation disease. Tortuous cervical internal carotid arteries but without stenosis. 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 2. 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 3. 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 Mild to moderate stenosis of the left P1 segment. Atherosclerotic irregularity of the more distal PCA branches. 4. Small acute infarctions in the right ACA territory difficult to appreciate by CT. No evidence of hemorrhage or mass effect. 5. Emphysema. 9 mm region of ground-glass opacity in the left lung apex. Initial follow-up with CT at 6-12 months is recommended to confirm persistence. If persistent, repeat CT is recommended every 2 years until 5 years of stability has been established. This recommendation follows the consensus statement: Guidelines for Management of Incidental Pulmonary Nodules Detected on CT Images: From the Fleischner Society 2017; Radiology 2017; 284:228-243. Aortic Atherosclerosis (ICD10-I70.0) and Emphysema (ICD10-J43.9). Electronically Signed   By: Paulina Fusi M.D.   On: 05/13/2020 08:37   CT  Lumbar Spine Wo Contrast  Result Date: 05/12/2020 CLINICAL DATA:  Status post fall x2 over the past week. Bilateral lower extremity weakness. EXAM: CT LUMBAR SPINE WITHOUT CONTRAST TECHNIQUE: Multidetector CT imaging of the lumbar spine was performed without intravenous contrast administration. Multiplanar CT image reconstructions were also generated. COMPARISON:  None. FINDINGS: Segmentation: Standard.  Alignment: No listhesis. Mild convex right scoliosis with the apex at L4-5. Vertebrae: No fracture or focal lesion. Paraspinal and other soft tissues: Small right renal cyst and scattered aortic atherosclerotic calcifications noted. Disc levels: T11-12: Negative. T12-L1: Negative. L1-2: Negative. L2-3: Shallow disc bulge and mild facet degenerative change. No stenosis. L3-4: Very shallow disc bulge and mild facet arthropathy. No stenosis. L4-5: Mild facet degenerative disease and a shallow disc bulge. Central canal is open. Scratch the central canal and right foramen are open. Mild left foraminal narrowing. L5-S1: Shallow disc bulge and mild facet degenerative change. No stenosis. IMPRESSION: No acute abnormality or finding to explain the patient's symptoms. Mild left foraminal narrowing at L4-5. The foramina are otherwise open at all levels. The central canal is patent throughout. Aortic Atherosclerosis (ICD10-I70.0). Electronically Signed   By: Drusilla Kanner M.D.   On: 05/12/2020 14:03   MR ANGIO HEAD WO CONTRAST  Result Date: 05/13/2020 CLINICAL DATA:  Neurological deficit. Right ACA territory infarction shown by MRI yesterday. EXAM: MRA HEAD WITHOUT CONTRAST TECHNIQUE: Angiographic images of the Circle of Willis were obtained using MRA technique without intravenous contrast. COMPARISON:  MRI 05/12/2020 FINDINGS: Both internal carotid arteries are widely patent through the skull base and siphon regions. The anterior and middle cerebral vessels are patent without proximal stenosis, aneurysm or vascular malformation. No sign of large or medium vessel occlusion. Specifically, the right anterior cerebral artery appears normal to the Peri close all level. Beyond that, the vessel shows a diminished caliber, probably secondary to embolic disease. Distal MCA branch vessels show some atherosclerotic irregularity. Both vertebral arteries are widely patent through the foramen magnum to the basilar. Large left PICA.  Dominant right anterior inferior cerebellar artery. Mild atherosclerotic narrowing and irregularity of the basilar without flow limiting basilar stenosis. Both superior cerebellar arteries show flow. The left superior cerebellar artery arises from the proximal PCA. Atherosclerotic disease with stenosis of right P1 segment, moderate to severe. Mild stenotic disease of the left P1 segment. Distal branch vessel atherosclerotic irregularity. IMPRESSION: 1. Diminished flow in the right anterior cerebral artery beginning at the very close all level. Some small flow does persist. This is likely due to embolic disease. 2. 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 Mild stenotic disease of the left P1 segment. 3. Distal branch vessel atherosclerotic irregularity particularly of the MCA and PCA branches. Electronically Signed   By: Paulina Fusi M.D.   On: 05/13/2020 06:10   MR BRAIN WO CONTRAST  Result Date: 05/12/2020 CLINICAL DATA:  79 year old male with leg weakness for the past week. Newly unable to walk. Associated recent falls. Chronic back pain. EXAM: MRI HEAD WITHOUT CONTRAST TECHNIQUE: Multiplanar, multiecho pulse sequences of the brain and surrounding structures were obtained without intravenous contrast. COMPARISON:  CT head and lumbar spine earlier today. FINDINGS: Brain: Confluent 14 mm area of restricted diffusion in the right body of the corpus callosum (series 2, image 32 and series 3, image 24) is associated with T2 and FLAIR hyperintensity. The abnormal white matter there appears mildly expanded (series 12, image 18). But there is no hemorrhage or mass effect. There are also multiple  small acute cortically based foci of restricted diffusion in the medial right superior frontal gyrus just posterior to the corpus callosum lesion (series 3, image 17) also with cortical T2 and FLAIR hyperintensity but no hemorrhage or mass effect. These are most compatible with distal  ACA territory ischemia. No other restricted diffusion. Confluent bilateral cerebral white matter T2 and FLAIR hyperintensity. Small chronic infarcts in the bilateral cerebellum, and also right central pons. No midline shift, mass effect, evidence of mass lesion, ventriculomegaly, extra-axial collection or acute intracranial hemorrhage. Cervicomedullary junction and pituitary are within normal limits. No chronic cerebral blood products identified. The deep gray matter nuclei appear more normal for age. No chronic cortical encephalomalacia aside from a possible tiny focus in the left superior perirolandic cortex on series 6, image 22). Vascular: Major intracranial vascular flow voids are preserved. There is mild intracranial artery tortuosity. Skull and upper cervical spine: Negative for age visible cervical spine. Visualized bone marrow signal is within normal limits. Sinuses/Orbits: Negative. Other: Visible internal auditory structures appear normal. Scalp and face appear negative. IMPRESSION: 1. 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 2. No associated hemorrhage or mass effect. No other acute intracranial abnormality. 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 Electronically Signed   By: Odessa Fleming M.D.   On: 05/12/2020 18:38   MR THORACIC SPINE WO CONTRAST  Result Date: 05/12/2020 CLINICAL DATA:  79 year old male with leg weakness for the past week. Newly unable to walk. Associated recent falls. Chronic back pain. EXAM: MRI THORACIC SPINE WITHOUT CONTRAST TECHNIQUE: Multiplanar, multisequence MR imaging of the thoracic spine was performed. No intravenous contrast was administered. COMPARISON:  Brain MRI and lumbar spine CT today reported separately. FINDINGS: Limited cervical spine imaging: Negative for age, straightening  of cervical lordosis. Thoracic spine segmentation:  Appears to be normal. Alignment:  Preserved thoracic kyphosis. No spondylolisthesis. Vertebrae: Visualized bone marrow signal is within normal limits. No marrow edema or evidence of acute osseous abnormality. Cord: Normal. Conus medullaris occurs below L1 and is not visible. Fairly capacious thoracic spinal canal and thecal sac throughout. Paraspinal and other soft tissues: Negative visible thoracic viscera. Partially visible multiple benign appearing exophytic renal cysts in the upper abdomen. Thoracic paraspinal soft tissues are within normal limits. Disc levels: Mild for age thoracic spine degeneration. No discrete disc herniation. No spinal stenosis. No convincing neural impingement. IMPRESSION: Normal for age MRI appearance of the Thoracic Spine. No spinal stenosis. Electronically Signed   By: Odessa Fleming M.D.   On: 05/12/2020 18:41   MR LUMBAR SPINE WO CONTRAST  Result Date: 05/12/2020 CLINICAL DATA:  79 year old male with leg weakness for the past week. Newly unable to walk. Associated recent falls. Chronic back pain. EXAM: MRI LUMBAR SPINE WITHOUT CONTRAST TECHNIQUE: Multiplanar, multisequence MR imaging of the lumbar spine was performed. No intravenous contrast was administered. COMPARISON:  Thoracic spine MRI and lumbar spine CT today reported separately. FINDINGS: Segmentation: Normal, concordant with the thoracic numbering and earlier CT today. Alignment: Mild levoconvex lumbar scoliosis and straightening of lordosis as seen by CT today. Vertebrae: Heterogeneous bone marrow signal throughout the lumbar spine and pelvis. But no convincing marrow edema or suspicious osseous lesion identified. Intact visible sacrum and SI joints. Conus medullaris and cauda equina: Conus extends to the L1-L2 level. No lower spinal cord or conus signal abnormality. Capacious lower thoracic and lumbar  spinal canal. Cauda equina nerve roots appear normal. Paraspinal and other  soft tissues: Partially visible urinary bladder wall thickening. Negative visible other abdominal and pelvic viscera. Lumbar paraspinal soft tissues within normal limits. Disc levels: Age-appropriate lumbar spine degeneration including circumferential disc bulging and mild facet hypertrophy. No lumbar spinal stenosis. No high-grade neural foraminal stenosis. IMPRESSION: 1. Age-appropriate lumbar spine degeneration with no spinal stenosis or convincing neural impingement. 2. Urinary bladder wall thickening. Query bladder outlet obstruction. Electronically Signed   By: Odessa Fleming M.D.   On: 05/12/2020 18:45   ECHOCARDIOGRAM COMPLETE  Result Date: 05/13/2020    ECHOCARDIOGRAM REPORT   Patient Name:   KYSTON GONCE Date of Exam: 05/13/2020 Medical Rec #:  [id number]       Height:       72.0 in Accession #:    [id number]      Weight:       183.0 lb Date of Birth:  Apr 01, 1941       BSA:          2.052 m Patient Age:    79 years        BP:           141/88 mmHg Patient Gender: M               HR:           76 bpm. Exam Location:  Inpatient Procedure: 2D Echo, Cardiac Doppler and Color Doppler Indications:    Stroke  History:        Patient has prior history of Echocardiogram examinations, most                 recent 09/21/2017. Risk Factors:Hypertension and Dyslipidemia.  Sonographer:    Ross Ludwig RDCS (AE) Referring Phys: [id number] Meryle Ready [facility] IMPRESSIONS  1. Left ventricular ejection fraction, by estimation, is 60 to 65%. The left ventricle has normal function. The left ventricle has no regional wall motion abnormalities. There is moderate concentric left ventricular hypertrophy. Left ventricular diastolic parameters are consistent with Grade I diastolic dysfunction (impaired relaxation).  2. Right ventricular systolic function is normal. The right ventricular size is normal. Tricuspid regurgitation signal is inadequate for assessing PA pressure.  3. The mitral valve is normal in structure. No evidence of mitral  valve regurgitation. No evidence of mitral stenosis.  4. The aortic valve is normal in structure. Aortic valve regurgitation is trivial. No aortic stenosis is present.  5. The inferior vena cava is normal in size with greater than 50% respiratory variability, suggesting right atrial pressure of 3 mmHg. FINDINGS  Left Ventricle: Left ventricular ejection fraction, by estimation, is 60 to 65%. The left ventricle has normal function. The left ventricle has no regional wall motion abnormalities. The left ventricular internal cavity size was normal in size. There is  moderate concentric left ventricular hypertrophy. Left ventricular diastolic parameters are consistent with Grade I diastolic dysfunction (impaired relaxation). Indeterminate filling pressures. Right Ventricle: The right ventricular size is normal. No increase in right ventricular wall thickness. Right ventricular systolic function is normal. Tricuspid regurgitation signal is inadequate for assessing PA pressure. Left Atrium: Left atrial size was normal in size. Right Atrium: Right atrial size was normal in size. Pericardium: There is no evidence of pericardial effusion. Mitral Valve: The mitral valve is normal in structure. No evidence of mitral valve regurgitation. No evidence of mitral valve stenosis. Tricuspid Valve: The tricuspid valve is normal in structure. Tricuspid valve regurgitation is trivial. No evidence  of tricuspid stenosis. Aortic Valve: The aortic valve is normal in structure. Aortic valve regurgitation is trivial. No aortic stenosis is present. Aortic valve mean gradient measures 2.0 mmHg. Aortic valve peak gradient measures 3.5 mmHg. Aortic valve area, by VTI measures 2.30 cm. Pulmonic Valve: The pulmonic valve was normal in structure. Pulmonic valve regurgitation is not visualized. No evidence of pulmonic stenosis. Aorta: The aortic root is normal in size and structure. Venous: The inferior vena cava is normal in size with greater than  50% respiratory variability, suggesting right atrial pressure of 3 mmHg. IAS/Shunts: No atrial level shunt detected by color flow Doppler.  LEFT VENTRICLE PLAX 2D LVIDd:         3.30 cm  Diastology LVIDs:         2.40 cm  LV e' medial:    5.87 cm/s LV PW:         1.60 cm  LV E/e' medial:  11.1 LV IVS:        1.60 cm  LV e' lateral:   6.09 cm/s LVOT diam:     1.90 cm  LV E/e' lateral: 10.7 LV SV:         43 LV SV Index:   21 LVOT Area:     2.84 cm  RIGHT VENTRICLE             IVC RV Basal diam:  2.70 cm     IVC diam: 1.60 cm RV S prime:     10.70 cm/s TAPSE (M-mode): 1.7 cm LEFT ATRIUM             Index       RIGHT ATRIUM           Index LA diam:        3.00 cm 1.46 cm/m  RA Area:     12.10 cm LA Vol (A2C):   35.9 ml 17.50 ml/m RA Volume:   25.90 ml  12.62 ml/m LA Vol (A4C):   26.7 ml 13.01 ml/m LA Biplane Vol: 32.3 ml 15.74 ml/m  AORTIC VALVE AV Area (Vmax):    2.26 cm AV Area (Vmean):   2.38 cm AV Area (VTI):     2.30 cm AV Vmax:           92.90 cm/s AV Vmean:          66.700 cm/s AV VTI:            0.189 m AV Peak Grad:      3.5 mmHg AV Mean Grad:      2.0 mmHg LVOT Vmax:         74.20 cm/s LVOT Vmean:        56.000 cm/s LVOT VTI:          0.153 m LVOT/AV VTI ratio: 0.81  AORTA Ao Root diam: 4.10 cm Ao Asc diam:  3.70 cm MITRAL VALVE MV Area (PHT): 6.96 cm     SHUNTS MV Decel Time: 109 msec     Systemic VTI:  0.15 m MV E velocity: 65.10 cm/s   Systemic Diam: 1.90 cm MV A velocity: 103.00 cm/s MV E/A ratio:  0.63 Mihai Croitoru MD Electronically signed by Thurmon Fair MD Signature Date/Time: 05/13/2020/1:17:48 PM    Final       Discharge Exam: Vitals:   05/13/20 0614 05/13/20 1155  BP: (!) 156/95 (!) 141/88  Pulse: 65 76  Resp: 20 (!) 25  Temp: 97.6 F (36.4 C) 97.8 F (36.6 C)  SpO2: 97% 98%   Vitals:   05/13/20 0054 05/13/20 0205 05/13/20 0614 05/13/20 1155  BP: (!) 125/91 (!) 154/94 (!) 156/95 (!) 141/88  Pulse: 61 60 65 76  Resp: 16 20 20  (!) 25  Temp:   97.6 F (36.4 C) 97.8  F (36.6 C)  TempSrc:   Oral Oral  SpO2: 92% 92% 97% 98%  Weight:      Height:        General: Pt is alert, awake, not in acute distress Cardiovascular: RRR, S1/S2 +, no rubs, no gallops Respiratory: CTA bilaterally, no wheezing, no rhonchi Abdominal: Soft, NT, ND, bowel sounds + Extremities: no edema, no cyanosis    The results of significant diagnostics from this hospitalization (including imaging, microbiology, ancillary and laboratory) are listed below for reference.     Microbiology: Recent Results (from the past 240 hour(s))  Respiratory Panel by RT PCR (Flu A&B, Covid) - Nasopharyngeal Swab     Status: None   Collection Time: 05/12/20  2:08 PM   Specimen: Nasopharyngeal Swab  Result Value Ref Range Status   SARS Coronavirus 2 by RT PCR NEGATIVE NEGATIVE Final    Comment: (NOTE) SARS-CoV-2 target nucleic acids are NOT DETECTED.  The SARS-CoV-2 RNA is generally detectable in upper respiratoy specimens during the acute phase of infection. The lowest concentration of SARS-CoV-2 viral copies this assay can detect is 131 copies/mL. A negative result does not preclude SARS-Cov-2 infection and should not be used as the sole basis for treatment or other patient management decisions. A negative result may occur with  improper specimen collection/handling, submission of specimen other than nasopharyngeal swab, presence of viral mutation(s) within the areas targeted by this assay, and inadequate number of viral copies (<131 copies/mL). A negative result must be combined with clinical observations, patient history, and epidemiological information. The expected result is Negative.  Fact Sheet for Patients:  https://www.moore.com/https://www.fda.gov/media/142436/download  Fact Sheet for Healthcare Providers:  https://www.young.biz/https://www.fda.gov/media/142435/download  This test is no t yet approved or cleared by the [place] FDA and  has been authorized for detection and/or diagnosis of SARS-CoV-2 by FDA  under an Emergency Use Authorization (EUA). This EUA will remain  in effect (meaning this test can be used) for the duration of the COVID-19 declaration under Section 564(b)(1) of the Act, 21 U.S.C. section 360bbb-3(b)(1), unless the authorization is terminated or revoked sooner.     Influenza A by PCR NEGATIVE NEGATIVE Final   Influenza B by PCR NEGATIVE NEGATIVE Final    Comment: (NOTE) The Xpert Xpress SARS-CoV-2/FLU/RSV assay is intended as an aid in  the diagnosis of influenza from Nasopharyngeal swab specimens and  should not be used as a sole basis for treatment. Nasal washings and  aspirates are unacceptable for Xpert Xpress SARS-CoV-2/FLU/RSV  testing.  Fact Sheet for Patients: https://www.moore.com/https://www.fda.gov/media/142436/download  Fact Sheet for Healthcare Providers: https://www.young.biz/https://www.fda.gov/media/142435/download  This test is not yet approved or cleared by the [place] FDA and  has been authorized for detection and/or diagnosis of SARS-CoV-2 by  FDA under an Emergency Use Authorization (EUA). This EUA will remain  in effect (meaning this test can be used) for the duration of the  Covid-19 declaration under Section 564(b)(1) of the Act, 21  U.S.C. section 360bbb-3(b)(1), unless the authorization is  terminated or revoked. Performed at [facility] [place], [address] [place], [place] [id number]      Labs: BNP (last 3 results) No results for input(s): BNP in the last 8760 hours. Basic Metabolic  Panel: Recent Labs  Lab 05/12/20 1409 05/13/20 0402  NA 142  --   K 3.5  --   CL 106  --   CO2 26  --   GLUCOSE 98  --   BUN 18  --   CREATININE 1.06 0.77  CALCIUM 9.1  --    Liver Function Tests: Recent Labs  Lab 05/12/20 1409  AST 9*  ALT 15  ALKPHOS 59  BILITOT 0.6  PROT 7.1  ALBUMIN 3.7   No results for input(s): LIPASE, AMYLASE in the last 168 hours. No results for input(s): AMMONIA in the last 168 hours. CBC: Recent Labs  Lab 05/12/20 1409  05/13/20 0402  WBC 5.6 5.3  NEUTROABS 3.5  --   HGB 14.4 13.6  HCT 44.0 42.0  MCV 94.2 93.5  PLT 338 314   Cardiac Enzymes: No results for input(s): CKTOTAL, CKMB, CKMBINDEX, TROPONINI in the last 168 hours. BNP: Invalid input(s): POCBNP CBG: No results for input(s): GLUCAP in the last 168 hours. D-Dimer No results for input(s): DDIMER in the last 72 hours. Hgb A1c Recent Labs    05/13/20 0402  HGBA1C 6.2*   Lipid Profile Recent Labs    05/13/20 0402  CHOL 124  HDL 38*  LDLCALC 75  TRIG 53  CHOLHDL 3.3   Thyroid function studies No results for input(s): TSH, T4TOTAL, T3FREE, THYROIDAB in the last 72 hours.  Invalid input(s): FREET3 Anemia work up No results for input(s): VITAMINB12, FOLATE, FERRITIN, TIBC, IRON, RETICCTPCT in the last 72 hours. Urinalysis    Component Value Date/Time   COLORURINE YELLOW 09/20/2017 0513   APPEARANCEUR CLEAR 09/20/2017 0513   LABSPEC 1.027 09/20/2017 0513   PHURINE 5.0 09/20/2017 0513   GLUCOSEU NEGATIVE 09/20/2017 0513   HGBUR SMALL (A) 09/20/2017 0513   BILIRUBINUR NEGATIVE 09/20/2017 0513   KETONESUR 20 (A) 09/20/2017 0513   PROTEINUR NEGATIVE 09/20/2017 0513   NITRITE NEGATIVE 09/20/2017 0513   LEUKOCYTESUR MODERATE (A) 09/20/2017 0513   Sepsis Labs Invalid input(s): PROCALCITONIN,  WBC,  LACTICIDVEN Microbiology Recent Results (from the past 240 hour(s))  Respiratory Panel by RT PCR (Flu A&B, Covid) - Nasopharyngeal Swab     Status: None   Collection Time: 05/12/20  2:08 PM   Specimen: Nasopharyngeal Swab  Result Value Ref Range Status   SARS Coronavirus 2 by RT PCR NEGATIVE NEGATIVE Final    Comment: (NOTE) SARS-CoV-2 target nucleic acids are NOT DETECTED.  The SARS-CoV-2 RNA is generally detectable in upper respiratoy specimens during the acute phase of infection. The lowest concentration of SARS-CoV-2 viral copies this assay can detect is 131 copies/mL. A negative result does not preclude  SARS-Cov-2 infection and should not be used as the sole basis for treatment or other patient management decisions. A negative result may occur with  improper specimen collection/handling, submission of specimen other than nasopharyngeal swab, presence of viral mutation(s) within the areas targeted by this assay, and inadequate number of viral copies (<131 copies/mL). A negative result must be combined with clinical observations, patient history, and epidemiological information. The expected result is Negative.  Fact Sheet for Patients:  https://www.moore.com/  Fact Sheet for Healthcare Providers:  https://www.young.biz/  This test is no t yet approved or cleared by the [place] FDA and  has been authorized for detection and/or diagnosis of SARS-CoV-2 by FDA under an Emergency Use Authorization (EUA). This EUA will remain  in effect (meaning this test can be used) for the duration of the COVID-19 declaration under  Section 564(b)(1) of the Act, 21 U.S.C. section 360bbb-3(b)(1), unless the authorization is terminated or revoked sooner.     Influenza A by PCR NEGATIVE NEGATIVE Final   Influenza B by PCR NEGATIVE NEGATIVE Final    Comment: (NOTE) The Xpert Xpress SARS-CoV-2/FLU/RSV assay is intended as an aid in  the diagnosis of influenza from Nasopharyngeal swab specimens and  should not be used as a sole basis for treatment. Nasal washings and  aspirates are unacceptable for Xpert Xpress SARS-CoV-2/FLU/RSV  testing.  Fact Sheet for Patients: https://www.moore.com/  Fact Sheet for Healthcare Providers: https://www.young.biz/  This test is not yet approved or cleared by the [place] FDA and  has been authorized for detection and/or diagnosis of SARS-CoV-2 by  FDA under an Emergency Use Authorization (EUA). This EUA will remain  in effect (meaning this test can be used) for the duration of the   Covid-19 declaration under Section 564(b)(1) of the Act, 21  U.S.C. section 360bbb-3(b)(1), unless the authorization is  terminated or revoked. Performed at [facility], [address] [place], [place] [address]      Time coordinating discharge: Over 30 minutes  SIGNED:   Hughie Closs, MD  [facility] 05/13/2020, 2:06 PM  If 7PM-7AM, please contact night-coverage [email or web address]

## 2020-05-13 NOTE — ED Provider Notes (Signed)
  Physical Exam  BP (!) 156/95 (BP Location: Right Arm)   Pulse 65   Temp 97.6 F (36.4 C) (Oral)   Resp 20   Ht 6' (1.829 m)   Wt 83 kg   SpO2 97%   BMI 24.82 kg/m   Physical Exam  ED Course/Procedures     Procedures  MDM  Please see Dr. Arlington Calix note for prior history, physical and care.  Briefly this is a 79yo male who presented with left lower extremity numbness and weakness, waxing and waning since Wednesday.  CCT without abnormalities, MR back and brain pending.  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  Reports strength improved on my evaluation. Will admit for further care of CVA. Neurology consulted.       Alvira Monday, MD 05/13/20 1102

## 2020-05-13 NOTE — Progress Notes (Signed)
Received patient for care around 2130 from ED. MAR, chart, and labs reviewed. Patient alert and oriented x4. Skin assessed with no skin issues noted. Patient able to turn and reposition self with no assist. Patient continent x2. Vitals stable this shift. NIHSS score 0. Call light in reach, bed in low position, wheels locked. No acute events this shift. Will continue to monitor for signs and symptoms of deterioration and report as needed.

## 2020-05-13 NOTE — Evaluation (Signed)
Physical Therapy Evaluation Patient Details Name: Adrian Rodgers MRN: [id number] DOB: 06-Jul-1940 Today's Date: 05/13/2020   History of Present Illness  Adrian Rodgers is a 79 y.o. male with history of hypertension and hyperlipidemia was brought to the ER after patient had difficulty walking due to left leg weakness. 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 PMH: HTN, high cholesterol    Clinical Impression  Pt admitted with above. Pt soft spoken but alert and orientedx4. Pt with c/o L foot callous/bone spur during ambulation. Pt requiring minA via R HHA for safe ambulation with progressive increased dependence on therapist with increased amb. Pt given RW, pt reports less pain in L foot and demonstrates improved step height and length with quicker pace. Recommend use of RW when at home. Pt states his dtr can stay with him a few days if needed. Acute PT to cont to follow to progress indep with mobility however with symptoms resolving and improved ambulation with RW I don't believe pt will need and PT follow up after d/c. Pt declined HHPT stating "I think I"ll be fine."  Acute PT to cont to follow.    Follow Up Recommendations No PT follow up;Supervision/Assistance - 24 hour (24/7 for 2-3 days)    Equipment Recommendations  Rolling walker with 5" wheels    Recommendations for Other Services       Precautions / Restrictions Precautions Precautions: Fall Restrictions Weight Bearing Restrictions: No      Mobility  Bed Mobility Overal bed mobility: Modified Independent             General bed mobility comments: HOB flat, pulled self to long sit without use of bed rail    Transfers Overall transfer level: Needs assistance Equipment used: None Transfers: Sit to/from Stand Sit to Stand: Min assist         General transfer comment: minA to power up and to steady until pt achieve full upright standing, pt with posterior bias leaning on bed  with back of legs, minA to attain mid line and upright posture, wide base of support  Ambulation/Gait Ambulation/Gait assistance: Min assist Gait Distance (Feet): 120 Feet Assistive device: Rolling walker (2 wheeled);1 person hand held assist Gait Pattern/deviations: Step-through pattern;Decreased stance time - left;Antalgic;Decreased weight shift to left Gait velocity: dec Gait velocity interpretation: <1.31 ft/sec, indicative of household ambulator General Gait Details: pt with short step height and length with limp on the L, pt states he has a callous/bone spur on the L foot and it hurts when he walks, pt with progressively increased support on R HHA with increased amb duration. Pt given a RW for last 30' to help offset L LE WBing and dec pain. pt with much improved gait pattern, step length and height, pt states "that feels much better, I can't put more weight on it"  Stairs            Wheelchair Mobility    Modified Rankin (Stroke Patients Only) Modified Rankin (Stroke Patients Only) Pre-Morbid Rankin Score: No significant disability Modified Rankin: Slight disability     Balance Overall balance assessment: Needs assistance Sitting-balance support: Feet supported;No upper extremity supported Sitting balance-Leahy Scale: Good Sitting balance - Comments: able to reach down and pull socks up without LOB   Standing balance support: Single extremity supported;During functional activity Standing balance-Leahy Scale: Poor Standing balance comment: requires unilateral HHA or use of RW for amb  Pertinent Vitals/Pain Pain Assessment: Faces Faces Pain Scale: Hurts whole lot Pain Location: L foot, pt reports having a callous and bone spur there, limiting amb tolerance Pain Descriptors / Indicators: Sharp Pain Intervention(s):  (gave RW for ambualtion to off-weight foot)    Home Living Family/patient expects to be discharged to:: Private  residence Living Arrangements: Alone Available Help at Discharge: Family;Available 24 hours/day (dtr can stay with him per patient) Type of Home: House Home Access: Stairs to enter Entrance Stairs-Rails: Right Entrance Stairs-Number of Steps: 5 Home Layout: One level Home Equipment: None      Prior Function Level of Independence: Independent         Comments: drives, dtr has groceries delivered     Hand Dominance   Dominant Hand: Right    Extremity/Trunk Assessment   Upper Extremity Assessment Upper Extremity Assessment: Overall WFL for tasks assessed    Lower Extremity Assessment Lower Extremity Assessment: Overall WFL for tasks assessed    Cervical / Trunk Assessment Cervical / Trunk Assessment: Normal  Communication   Communication: No difficulties  Cognition Arousal/Alertness: Awake/alert Behavior During Therapy: WFL for tasks assessed/performed Overall Cognitive Status: Within Functional Limits for tasks assessed                                        General Comments General comments (skin integrity, edema, etc.): SpO2 dec to 82% while amb on RA, pt with noted cough, pt states "Its form smoking, but I quit yesterday", once seated 93% SPO2 s/p 1 min    Exercises     Assessment/Plan    PT Assessment Patient needs continued PT services  PT Problem List Decreased strength;Decreased activity tolerance;Pain       PT Treatment Interventions DME instruction;Gait training;Stair training;Functional mobility training;Therapeutic activities;Therapeutic exercise;Balance training;Neuromuscular re-education;Cognitive remediation    PT Goals (Current goals can be found in the Care Plan section)  Acute Rehab PT Goals Patient Stated Goal: home today PT Goal Formulation: With patient Time For Goal Achievement: 05/27/20 Potential to Achieve Goals: Good Additional Goals Additional Goal #1: Pt to score >19 on DGI to indicate minimal falls risk.     Frequency Min 4X/week   Barriers to discharge        Co-evaluation               AM-PAC PT "6 Clicks" Mobility  Outcome Measure Help needed turning from your back to your side while in a flat bed without using bedrails?: None Help needed moving from lying on your back to sitting on the side of a flat bed without using bedrails?: None Help needed moving to and from a bed to a chair (including a wheelchair)?: A Little Help needed standing up from a chair using your arms (e.g., wheelchair or bedside chair)?: A Little Help needed to walk in hospital room?: A Little Help needed climbing 3-5 steps with a railing? : A Little 6 Click Score: 20    End of Session Equipment Utilized During Treatment: Gait belt Activity Tolerance: Patient tolerated treatment well Patient left: in chair;with chair alarm set Nurse Communication: Mobility status PT Visit Diagnosis: Unsteadiness on feet (R26.81);Muscle weakness (generalized) (M62.81);Pain Pain - Right/Left: Left Pain - part of body: Ankle and joints of foot    Time: 3419-6222 PT Time Calculation (min) (ACUTE ONLY): 28 min   Charges:   PT Evaluation $PT Eval Moderate Complexity: 1 Mod PT  Treatments $Gait Training: 8-22 mins        Lewis Shock, PT, DPT Acute Rehabilitation Services Pager #: [phone] Office #: [phone]   Iona Hansen 05/13/2020, 9:50 AM

## 2020-05-13 NOTE — Progress Notes (Signed)
STROKE TEAM PROGRESS NOTE   INTERVAL HISTORY No family is at the bedside.  Pt lying in bed, doing well, stated that his left leg weakness has resolved. PT/OT no recommendation. Discussed with loop recorder and he is in agreement to be placed as outpt.   OBJECTIVE Vitals:   05/12/20 2126 05/13/20 0054 05/13/20 0205 05/13/20 0614  BP: (!) 166/88 (!) 125/91 (!) 154/94 (!) 156/95  Pulse: 68 61 60 65  Resp: 18 16 20 20   Temp: 98 F (36.7 C)   97.6 F (36.4 C)  TempSrc: Oral   Oral  SpO2: 94% 92% 92% 97%  Weight:      Height:        CBC:  Recent Labs  Lab 05/12/20 1409 05/13/20 0402  WBC 5.6 5.3  NEUTROABS 3.5  --   HGB 14.4 13.6  HCT 44.0 42.0  MCV 94.2 93.5  PLT 338 314    Basic Metabolic Panel:  Recent Labs  Lab 05/12/20 1409 05/13/20 0402  NA 142  --   K 3.5  --   CL 106  --   CO2 26  --   GLUCOSE 98  --   BUN 18  --   CREATININE 1.06 0.77  CALCIUM 9.1  --     Lipid Panel:     Component Value Date/Time   CHOL 124 05/13/2020 0402   TRIG 53 05/13/2020 0402   HDL 38 (L) 05/13/2020 0402   CHOLHDL 3.3 05/13/2020 0402   VLDL 11 05/13/2020 0402   LDLCALC 75 05/13/2020 0402   HgbA1c:  Lab Results  Component Value Date   HGBA1C 6.2 (H) 05/13/2020   Urine Drug Screen: No results found for: LABOPIA, COCAINSCRNUR, LABBENZ, AMPHETMU, THCU, LABBARB  Alcohol Level No results found for: ETH  IMAGING  CT ANGIO HEAD W OR WO CONTRAST CT ANGIO NECK W OR WO CONTRAST 05/13/2020 IMPRESSION:  1. No significant carotid bifurcation disease. Tortuous cervical internal carotid arteries but without stenosis. 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  2. 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  3. 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 Mild to moderate stenosis of the left P1 segment. Atherosclerotic irregularity of the more distal PCA branches.  4. Small acute infarctions in the right ACA territory difficult to appreciate by CT. No evidence of hemorrhage or mass effect.  5. Emphysema. 9 mm region of ground-glass opacity in the left lung apex. Initial follow-up with CT at 6-12 months is recommended to confirm persistence. If persistent, repeat CT is recommended every 2 years until 5 years of stability has been established.  CT Head Wo Contrast 05/12/2020 IMPRESSION:  Chronic atrophic and ischemic changes without acute abnormality.   MR ANGIO HEAD WO CONTRAST 05/13/2020 IMPRESSION:  1. Diminished flow in the right anterior cerebral artery beginning at the very close all level. Some small flow does persist. This is likely due to embolic disease.  2. 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 Mild stenotic disease of the left P1 segment.  3. Distal branch vessel atherosclerotic irregularity particularly of the MCA and PCA branches.    MR BRAIN WO CONTRAST 05/12/2020 IMPRESSION:  1. Multiple small acute infarcts in the distal Right ACA territory, including medial motor  strip and/or pre motor involvement in the lower extremity representation area.  2. No associated hemorrhage or mass effect. No other acute intracranial abnormality. 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   CT Lumbar Spine Wo Contrast 05/12/2020 IMPRESSION:  No acute abnormality or finding to explain the patient's symptoms. Mild left foraminal narrowing at L4-5. The foramina are otherwise open at all levels. The central canal is patent throughout. Aortic Atherosclerosis (ICD10-I70.0).   MR THORACIC SPINE WO CONTRAST 05/12/2020 IMPRESSION:  Normal for age MRI appearance of the Thoracic Spine. No spinal stenosis.   MR LUMBAR SPINE WO  CONTRAST 05/12/2020 IMPRESSION:  1. Age-appropriate lumbar spine degeneration with no spinal stenosis or convincing neural impingement. 2. Urinary bladder wall thickening. Query bladder outlet obstruction.   Transthoracic Echocardiogram  1. Left ventricular ejection fraction, by estimation, is 60 to 65%. The  left ventricle has normal function. The left ventricle has no regional  wall motion abnormalities. There is moderate concentric left ventricular  hypertrophy. Left ventricular  diastolic parameters are consistent with Grade I diastolic dysfunction  (impaired relaxation).  2. Right ventricular systolic function is normal. The right ventricular  size is normal. Tricuspid regurgitation signal is inadequate for assessing  PA pressure.  3. The mitral valve is normal in structure. No evidence of mitral valve  regurgitation. No evidence of mitral stenosis.  4. The aortic valve is normal in structure. Aortic valve regurgitation is  trivial. No aortic stenosis is present.  5. The inferior vena cava is normal in size with greater than 50%  respiratory variability, suggesting right atrial pressure of 3 mmHg.    PHYSICAL EXAM  Temp:  [97.6 F (36.4 C)-98.5 F (36.9 C)] 97.8 F (36.6 C) (11/13 1155) Pulse Rate:  [60-87] 76 (11/13 1155) Resp:  [15-25] 25 (11/13 1155) BP: (125-166)/(79-95) 141/88 (11/13 1155) SpO2:  [92 %-99 %] 98 % (11/13 1155)  General - Well nourished, well developed, in no apparent distress.  Ophthalmologic - fundi not visualized due to noncooperation.  Cardiovascular - Regular rhythm and rate.  Mental Status -  Level of arousal and orientation to time, place, and person were intact. Language including expression, naming, repetition, comprehension was assessed and found intact.  Cranial Nerves II - XII - II - Visual field intact OU. III, IV, VI - Extraocular movements intact. V - Facial sensation intact bilaterally. VII - Facial movement intact  bilaterally. VIII - Hearing & vestibular intact bilaterally. X - Palate elevates symmetrically. XI - Chin turning & shoulder shrug intact bilaterally. XII - Tongue protrusion intact.  Motor Strength - The patient's strength was normal in all extremities and pronator drift was absent.  Bulk was normal and fasciculations were absent.   Motor Tone - Muscle tone was assessed at the neck and appendages and was normal.  Reflexes - The patient's reflexes were symmetrical in all extremities and he had no pathological reflexes.  Sensory - Light touch, temperature/pinprick were assessed and were symmetrical.    Coordination - The patient had normal movements in the hands and feet with no ataxia or dysmetria.  Tremor was absent.  Gait and Station - deferred.   ASSESSMENT/PLAN [name] is a 79 y.o. male with history of  HTN, tobacco use, and HLD who presents with left leg weakness. He did not receive IV t-PA due to late presentation (>4.5 hours from time of onset).  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  CT head - Chronic atrophic and ischemic changes without acute abnormality.   MRI head - Multiple small acute infarcts in the distal Right ACA territory, including medial motor strip and/or pre motor involvement in the lower extremity representation area.   MRA head - Diminished flow in the right anterior cerebral artery beginning at the very close all level. Some small flow does persist. This is likely due to embolic disease.   CTA H&N - Decreased caliber of the right pericallosal artery with diminished flow compared to the left. 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   2D Echo - EF 60-65%  LE venous doppler not performed - Low suspicious for DVT at this time  Will arrange outpt loop recorder to rule out afib - will contact Dr. Monica Becton Virus 2  - negative  LDL - 75  HgbA1c - 6.2  VTE prophylaxis -  SCDs  No antithrombotic prior to admission, now on aspirin 81 mg daily and clopidogrel 75 mg daily DAPT for 3 weeks and then ASA alone.   Patient counseled to be compliant with his antithrombotic medications  Ongoing aggressive stroke risk factor management  Therapy recommendations:  None  Disposition:  Home today  Hypertension  Home BP meds: Cozaar  Stable now . Long-term BP goal normotensive  Hyperlipidemia  Home Lipid lowering medication: Lipitor 40 mg daily  LDL 75, goal < 70  Current lipid lowering medication: Lipitor 80 mg daily   Continue statin at discharge  Tobacco abuse  Current smoker  Smoking cessation counseling provided  Pt is willing to quit  Other Stroke Risk Factors  Advanced age  ETOH use, advised to drink no more than 1 alcoholic beverage per day.  Other Active Problems  Code status - Full code   CTA - 9 mm region of ground-glass opacity in the left lung apex. Initial follow-up with CT at 6-12 months is recommended to confirm persistence. If persistent, repeat CT is recommended every 2 years until 5 years of stability has been established.  Aortic Atherosclerosis (ICD10-I70.0).    Hospital day # 0  Neurology will sign off. Please call with questions. Pt will follow up with stroke clinic NP at [facility] in about 4 weeks. Thanks for the consult.  Marvel Plan, MD PhD Stroke Neurology 05/13/2020 2:09 PM   To contact Stroke Continuity provider, please refer to [email or web address]. After hours, contact General Neurology

## 2020-05-13 NOTE — Progress Notes (Signed)
  Echocardiogram 2D Echocardiogram has been performed.  Gerda Diss 05/13/2020, 1:12 PM

## 2020-05-13 NOTE — H&P (Signed)
History and Physical    Adrian Rodgers IWP:[id number] DOB: [birth year] DOA: 05/12/2020  PCP: [facility]  Patient coming from: Home.  Chief Complaint: Left leg weakness.  HPI: Adrian Rodgers is a 79 y.o. male with history of hypertension and hyperlipidemia was brought to the ER after patient had difficulty walking due to left leg weakness.  Patient's weakness started on the morning of normal 06/19/2020 when he woke up from the sleep around 9 AM.  Denies any weakness of lower extremities or difficulty speaking or swallowing.  Has not had any previous stroke.  His daughter went to check on him and he found it difficult to open the door and his daughter found that patient was crawling on the floor to ambulate.  ED Course: In the ER patient appeared nonfocal.  Strength was back to normal.  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  EKG is pending.  Telemetry shows normal sinus rhythm.  On-call neurologist was consulted.  Patient passed swallow.  Admit for further stroke work-up.  Lab work was largely unremarkable.  Covid test was negative.  Review of Systems: As per HPI, rest all negative.   Past Medical History:  Diagnosis Date  . High cholesterol   . Hypertension     History reviewed. No pertinent surgical history.   reports that he has been smoking cigarettes. He has a 20.00 pack-year smoking history. He has never used smokeless tobacco. He reports current alcohol use of about 1.0 standard drink of alcohol per week. He reports that he does not use drugs.  No Known Allergies  Family History  Family history unknown: Yes    Prior to Admission medications   Medication Sig Start Date End Date Taking? Authorizing Provider  acetaminophen (TYLENOL) 500 MG tablet Take 500-1,000 mg by mouth every 8 (eight) hours as needed for mild pain (or headaches).    Yes [provider]  atorvastatin (LIPITOR) 40 MG tablet Take 40 mg  by mouth daily.   Yes [provider]  losartan (COZAAR) 50 MG tablet Take 50 mg by mouth daily. 05/02/20  Yes [provider]  Multiple Vitamins-Minerals (CENTRUM SILVER 50+MEN) TABS Take 1 tablet by mouth daily with breakfast.   Yes [provider]  albuterol (PROVENTIL HFA;VENTOLIN HFA) 108 (90 Base) MCG/ACT inhaler Inhale 2 puffs into the lungs every 6 (six) hours as needed for wheezing or shortness of breath. 09/21/17   Purohit, Salli Quarry, MD  cefdinir (OMNICEF) 300 MG capsule Take 1 capsule (300 mg total) by mouth 2 (two) times daily. Patient not taking: Reported on 05/12/2020 09/21/17   Purohit, Salli Quarry, MD  guaiFENesin-dextromethorphan (ROBITUSSIN DM) 100-10 MG/5ML syrup Take 5 mLs by mouth every 4 (four) hours as needed for cough. Patient not taking: Reported on 05/12/2020 09/21/17   Delaine Lame, MD  Spacer/Aero Chamber Mouthpiece MISC 1 Units by Does not apply route every 4 (four) hours as needed. 09/21/17   Purohit, Salli Quarry, MD    Physical Exam: Constitutional: Moderately built and nourished. Vitals:   05/12/20 1827 05/12/20 1927 05/12/20 2046 05/12/20 2126  BP: (!) 147/85 134/86 (!) 151/91 (!) 166/88  Pulse: 78 66 63 68  Resp: 15 16 15 18   Temp:   98 F (36.7 C) 98 F (36.7 C)  TempSrc:   Oral Oral  SpO2: 99% 99% 96% 94%  Weight:      Height:       Eyes: Anicteric no  pallor. ENMT: No discharge from the ears eyes nose or mouth. Neck: No mass felt.  No neck rigidity.  No JVD appreciated. Respiratory: No rhonchi or crepitations. Cardiovascular: S1-S2 heard. Abdomen: Soft nontender bowel sounds present. Musculoskeletal: No edema. Skin: No rash. Neurologic: Alert awake oriented to time place and person.  Moves all extremities 5 x 5.  No facial asymmetry tongue is midline pupils equal and reacting to light. Psychiatric: Appears normal.  Normal affect.   Labs on Admission: I have personally reviewed following labs and imaging studies  CBC: Recent  Labs  Lab 05/12/20 1409  WBC 5.6  NEUTROABS 3.5  HGB 14.4  HCT 44.0  MCV 94.2  PLT 338   Basic Metabolic Panel: Recent Labs  Lab 05/12/20 1409  NA 142  K 3.5  CL 106  CO2 26  GLUCOSE 98  BUN 18  CREATININE 1.06  CALCIUM 9.1   GFR: Estimated Creatinine Clearance: 63 mL/min (by C-G formula based on SCr of 1.06 mg/dL). Liver Function Tests: Recent Labs  Lab 05/12/20 1409  AST 9*  ALT 15  ALKPHOS 59  BILITOT 0.6  PROT 7.1  ALBUMIN 3.7   No results for input(s): LIPASE, AMYLASE in the last 168 hours. No results for input(s): AMMONIA in the last 168 hours. Coagulation Profile: No results for input(s): INR, PROTIME in the last 168 hours. Cardiac Enzymes: No results for input(s): CKTOTAL, CKMB, CKMBINDEX, TROPONINI in the last 168 hours. BNP (last 3 results) No results for input(s): PROBNP in the last 8760 hours. HbA1C: No results for input(s): HGBA1C in the last 72 hours. CBG: No results for input(s): GLUCAP in the last 168 hours. Lipid Profile: No results for input(s): CHOL, HDL, LDLCALC, TRIG, CHOLHDL, LDLDIRECT in the last 72 hours. Thyroid Function Tests: No results for input(s): TSH, T4TOTAL, FREET4, T3FREE, THYROIDAB in the last 72 hours. Anemia Panel: No results for input(s): VITAMINB12, FOLATE, FERRITIN, TIBC, IRON, RETICCTPCT in the last 72 hours. Urine analysis:    Component Value Date/Time   COLORURINE YELLOW 09/20/2017 0513   APPEARANCEUR CLEAR 09/20/2017 0513   LABSPEC 1.027 09/20/2017 0513   PHURINE 5.0 09/20/2017 0513   GLUCOSEU NEGATIVE 09/20/2017 0513   HGBUR SMALL (A) 09/20/2017 0513   BILIRUBINUR NEGATIVE 09/20/2017 0513   KETONESUR 20 (A) 09/20/2017 0513   PROTEINUR NEGATIVE 09/20/2017 0513   NITRITE NEGATIVE 09/20/2017 0513   LEUKOCYTESUR MODERATE (A) 09/20/2017 0513   Sepsis Labs: @LABRCNTIP (procalcitonin:4,lacticidven:4) ) Recent Results (from the past 240 hour(s))  Respiratory Panel by RT PCR (Flu A&B, Covid) - Nasopharyngeal  Swab     Status: None   Collection Time: 05/12/20  2:08 PM   Specimen: Nasopharyngeal Swab  Result Value Ref Range Status   SARS Coronavirus 2 by RT PCR NEGATIVE NEGATIVE Final    Comment: (NOTE) SARS-CoV-2 target nucleic acids are NOT DETECTED.  The SARS-CoV-2 RNA is generally detectable in upper respiratoy specimens during the acute phase of infection. The lowest concentration of SARS-CoV-2 viral copies this assay can detect is 131 copies/mL. A negative result does not preclude SARS-Cov-2 infection and should not be used as the sole basis for treatment or other patient management decisions. A negative result may occur with  improper specimen collection/handling, submission of specimen other than nasopharyngeal swab, presence of viral mutation(s) within the areas targeted by this assay, and inadequate number of viral copies (<131 copies/mL). A negative result must be combined with clinical observations, patient history, and epidemiological information. The expected result is Negative.  Fact Sheet  for Patients:  https://www.moore.com/  Fact Sheet for Healthcare Providers:  https://www.young.biz/  This test is no t yet approved or cleared by the [place] FDA and  has been authorized for detection and/or diagnosis of SARS-CoV-2 by FDA under an Emergency Use Authorization (EUA). This EUA will remain  in effect (meaning this test can be used) for the duration of the COVID-19 declaration under Section 564(b)(1) of the Act, 21 U.S.C. section 360bbb-3(b)(1), unless the authorization is terminated or revoked sooner.     Influenza A by PCR NEGATIVE NEGATIVE Final   Influenza B by PCR NEGATIVE NEGATIVE Final    Comment: (NOTE) The Xpert Xpress SARS-CoV-2/FLU/RSV assay is intended as an aid in  the diagnosis of influenza from Nasopharyngeal swab specimens and  should not be used as a sole basis for treatment. Nasal washings and  aspirates are  unacceptable for Xpert Xpress SARS-CoV-2/FLU/RSV  testing.  Fact Sheet for Patients: https://www.moore.com/  Fact Sheet for Healthcare Providers: https://www.young.biz/  This test is not yet approved or cleared by the [place] FDA and  has been authorized for detection and/or diagnosis of SARS-CoV-2 by  FDA under an Emergency Use Authorization (EUA). This EUA will remain  in effect (meaning this test can be used) for the duration of the  Covid-19 declaration under Section 564(b)(1) of the Act, 21  U.S.C. section 360bbb-3(b)(1), unless the authorization is  terminated or revoked. Performed at [facility], [address] [place], [place] [address]      Radiological Exams on Admission: CT Head Wo Contrast  Result Date: 05/12/2020 CLINICAL DATA:  Bilateral leg weakness, history of recent falls EXAM: CT HEAD WITHOUT CONTRAST TECHNIQUE: Contiguous axial images were obtained from the base of the skull through the vertex without intravenous contrast. COMPARISON:  None. FINDINGS: Brain: Chronic atrophic and white matter ischemic changes are noted. Basal ganglia calcifications are noted bilaterally. No findings to suggest acute hemorrhage, acute infarction or space-occupying mass lesion are noted. Vascular: No hyperdense vessel or unexpected calcification. Skull: Normal. Negative for fracture or focal lesion. Sinuses/Orbits: No acute finding. Other: None. IMPRESSION: Chronic atrophic and ischemic changes without acute abnormality. Electronically Signed   By: Alcide Clever M.D.   On: 05/12/2020 13:57   CT Lumbar Spine Wo Contrast  Result Date: 05/12/2020 CLINICAL DATA:  Status post fall x2 over the past week. Bilateral lower extremity weakness. EXAM: CT LUMBAR SPINE WITHOUT CONTRAST TECHNIQUE: Multidetector CT imaging of the lumbar spine was performed without intravenous contrast administration. Multiplanar CT image reconstructions were also  generated. COMPARISON:  None. FINDINGS: Segmentation: Standard. Alignment: No listhesis. Mild convex right scoliosis with the apex at L4-5. Vertebrae: No fracture or focal lesion. Paraspinal and other soft tissues: Small right renal cyst and scattered aortic atherosclerotic calcifications noted. Disc levels: T11-12: Negative. T12-L1: Negative. L1-2: Negative. L2-3: Shallow disc bulge and mild facet degenerative change. No stenosis. L3-4: Very shallow disc bulge and mild facet arthropathy. No stenosis. L4-5: Mild facet degenerative disease and a shallow disc bulge. Central canal is open. Scratch the central canal and right foramen are open. Mild left foraminal narrowing. L5-S1: Shallow disc bulge and mild facet degenerative change. No stenosis. IMPRESSION: No acute abnormality or finding to explain the patient's symptoms. Mild left foraminal narrowing at L4-5. The foramina are otherwise open at all levels. The central canal is patent throughout. Aortic Atherosclerosis (ICD10-I70.0). Electronically Signed   By: Drusilla Kanner M.D.   On: 05/12/2020 14:03   MR BRAIN WO CONTRAST  Result Date: 05/12/2020 CLINICAL DATA:  79 year old male with leg weakness for the past week. Newly unable to walk. Associated recent falls. Chronic back pain. EXAM: MRI HEAD WITHOUT CONTRAST TECHNIQUE: Multiplanar, multiecho pulse sequences of the brain and surrounding structures were obtained without intravenous contrast. COMPARISON:  CT head and lumbar spine earlier today. FINDINGS: Brain: Confluent 14 mm area of restricted diffusion in the right body of the corpus callosum (series 2, image 32 and series 3, image 24) is associated with T2 and FLAIR hyperintensity. The abnormal white matter there appears mildly expanded (series 12, image 18). But there is no hemorrhage or mass effect. There are also multiple small acute cortically based foci of restricted diffusion in the medial right superior frontal gyrus just posterior to the corpus  callosum lesion (series 3, image 17) also with cortical T2 and FLAIR hyperintensity but no hemorrhage or mass effect. These are most compatible with distal ACA territory ischemia. No other restricted diffusion. Confluent bilateral cerebral white matter T2 and FLAIR hyperintensity. Small chronic infarcts in the bilateral cerebellum, and also right central pons. No midline shift, mass effect, evidence of mass lesion, ventriculomegaly, extra-axial collection or acute intracranial hemorrhage. Cervicomedullary junction and pituitary are within normal limits. No chronic cerebral blood products identified. The deep gray matter nuclei appear more normal for age. No chronic cortical encephalomalacia aside from a possible tiny focus in the left superior perirolandic cortex on series 6, image 22). Vascular: Major intracranial vascular flow voids are preserved. There is mild intracranial artery tortuosity. Skull and upper cervical spine: Negative for age visible cervical spine. Visualized bone marrow signal is within normal limits. Sinuses/Orbits: Negative. Other: Visible internal auditory structures appear normal. Scalp and face appear negative. IMPRESSION: 1. 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 2. No associated hemorrhage or mass effect. No other acute intracranial abnormality. 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 Electronically Signed   By: Odessa FlemingH  Hall M.D.   On: 05/12/2020 18:38   MR THORACIC SPINE WO CONTRAST  Result Date: 05/12/2020 CLINICAL DATA:  79 year old male with leg weakness for the past week. Newly unable to walk. Associated recent falls. Chronic back pain. EXAM: MRI THORACIC SPINE WITHOUT CONTRAST TECHNIQUE: Multiplanar, multisequence MR imaging of the thoracic spine was performed. No intravenous contrast was  administered. COMPARISON:  Brain MRI and lumbar spine CT today reported separately. FINDINGS: Limited cervical spine imaging: Negative for age, straightening of cervical lordosis. Thoracic spine segmentation:  Appears to be normal. Alignment:  Preserved thoracic kyphosis. No spondylolisthesis. Vertebrae: Visualized bone marrow signal is within normal limits. No marrow edema or evidence of acute osseous abnormality. Cord: Normal. Conus medullaris occurs below L1 and is not visible. Fairly capacious thoracic spinal canal and thecal sac throughout. Paraspinal and other soft tissues: Negative visible thoracic viscera. Partially visible multiple benign appearing exophytic renal cysts in the upper abdomen. Thoracic paraspinal soft tissues are within normal limits. Disc levels: Mild for age thoracic spine degeneration. No discrete disc herniation. No spinal stenosis. No convincing neural impingement. IMPRESSION: Normal for age MRI appearance of the Thoracic Spine. No spinal stenosis. Electronically Signed   By: Odessa FlemingH  Hall M.D.   On: 05/12/2020 18:41   MR LUMBAR SPINE WO CONTRAST  Result Date: 05/12/2020 CLINICAL DATA:  79 year old male with leg weakness for the past week. Newly unable to walk. Associated recent falls. Chronic back  pain. EXAM: MRI LUMBAR SPINE WITHOUT CONTRAST TECHNIQUE: Multiplanar, multisequence MR imaging of the lumbar spine was performed. No intravenous contrast was administered. COMPARISON:  Thoracic spine MRI and lumbar spine CT today reported separately. FINDINGS: Segmentation: Normal, concordant with the thoracic numbering and earlier CT today. Alignment: Mild levoconvex lumbar scoliosis and straightening of lordosis as seen by CT today. Vertebrae: Heterogeneous bone marrow signal throughout the lumbar spine and pelvis. But no convincing marrow edema or suspicious osseous lesion identified. Intact visible sacrum and SI joints. Conus medullaris and cauda equina: Conus extends to the L1-L2 level. No  lower spinal cord or conus signal abnormality. Capacious lower thoracic and lumbar spinal canal. Cauda equina nerve roots appear normal. Paraspinal and other soft tissues: Partially visible urinary bladder wall thickening. Negative visible other abdominal and pelvic viscera. Lumbar paraspinal soft tissues within normal limits. Disc levels: Age-appropriate lumbar spine degeneration including circumferential disc bulging and mild facet hypertrophy. No lumbar spinal stenosis. No high-grade neural foraminal stenosis. IMPRESSION: 1. Age-appropriate lumbar spine degeneration with no spinal stenosis or convincing neural impingement. 2. Urinary bladder wall thickening. Query bladder outlet obstruction. Electronically Signed   By: Odessa Fleming M.D.   On: 05/12/2020 18:45     Assessment/Plan Principal Problem:   Acute CVA (cerebrovascular accident) [facility]) Active Problems:   Essential hypertension   HLD (hyperlipidemia)    1. Acute CVA -appreciate neurology consult.  Patient is on aspirin Plavix statins.  Passed swallow evaluation.  Check hemoglobin A1c lipid panel carotid Dopplers and MRA of the brain.  Neurochecks.  Physical therapy consult. 2. Hypertension allow for permissive hypertension.  On ARB. 3. Hyperlipidemia on statins. 4. Patient did have MRI of the T-spine L-spine done initially in the ER.  Did show thickening of the urinary bladder.  Will check UA.   DVT prophylaxis: Lovenox. Code Status: Full code. Family Communication: Patient's daughters at the bedside. Disposition Plan: Home. Consults called: Neurology. Admission status: Observation.   Eduard Clos MD [facility] Pager [phone].  If 7PM-7AM, please contact night-coverage [email or web address] Password [facility]  05/13/2020, 12:28 AM

## 2020-05-13 NOTE — Discharge Instructions (Signed)
 Hospital Discharge After a Stroke  Being discharged from the hospital after a stroke can feel overwhelming. Many things may be different, and it is normal to feel scared or anxious. Some stroke survivors may be able to return to their homes, and others may need more specialized care on a temporary or permanent basis. Your stroke care team will work with you to develop a discharge plan that is best for you. Ask questions if you do not understand something. Invite a friend or family member to participate in discharge planning. Understanding and following your discharge plan can help to prevent another stroke or other problems. Understanding your medicines After a stroke, your health care provider may prescribe one or more types of medicine. It is important to take medicines exactly as told by your health care provider. Serious harm, such as another stroke, can happen if you are unable to take your medicine exactly as prescribed. Make sure you understand:  What medicine to take.  Why you are taking the medicine.  How and when to take it.  If it can be taken with your other medicines and herbal supplements.  Possible side effects.  When to call your health care provider if you have any side effects.  How you will get and pay for your medicines. Medical assistance programs may be able to help you pay for prescription medicines if you cannot afford them. If you are taking an anticoagulant, be sure to take it exactly as told by your health care provider. This type of medicine can increase the risk of bleeding because it works to prevent blood from clotting. You may need to take certain precautions to prevent bleeding. You should contact your health care provider if you have:  Bleeding or bruising.  A fall or other injury to your head.  Blood in your urine or stool (feces). Planning for home safety  Take steps to prevent falls, such as installing grab bars or using a shower chair. Ask a  friend or family member to get needed things in place before you go home if possible. A therapist can come to your home to make recommendations for safety equipment. Ask your health care provider if you would benefit from this service or from home care. Getting needed equipment Ask your health care provider for a list of any medical equipment and supplies you will need at home. These may include items such as:  Walkers.  Canes.  Wheelchairs.  Hand-strengthening devices.  Special eating utensils. Medical equipment can be rented or purchased, depending on your insurance coverage. Check with your insurance company about what is covered. Keeping follow-up visits After a stroke, you will need to follow up regularly with a health care provider. You may also need rehabilitation, which can include physical therapy, occupational therapy, or speech-language therapy. Keeping these appointments is very important to your recovery after a stroke. Be sure to bring your medicine list and discharge papers with you to your appointments. If you need help to keep track of your schedule, use a calendar or appointment reminder. Preventing another stroke Having a stroke puts you at risk for another stroke in the future. Ask your health care provider what actions you can take to lower the risk. These may include:  Increasing how much you exercise.  Making a healthy eating plan.  Quitting smoking.  Managing other health conditions, such as high blood pressure, high cholesterol, or diabetes.  Limiting alcohol use. Knowing the warning signs of a stroke  Make   sure you understand the signs of a stroke. Before you leave the hospital, you will receive information outlining the stroke warning signs. Share these with your friends and family members. "BE FAST" is an easy way to remember the main warning signs of a stroke:  B - Balance. Signs are dizziness, sudden trouble walking, or loss of balance.  E - Eyes.  Signs are trouble seeing or a sudden change in vision.  F - Face. Signs are sudden weakness or numbness of the face, or the face or eyelid drooping on one side.  A - Arms. Signs are weakness or numbness in an arm. This happens suddenly and usually on one side of the body.  S - Speech. Signs are sudden trouble speaking, slurred speech, or trouble understanding what people say.  T - Time. Time to call emergency services. Write down what time symptoms started. Other signs of stroke may include:  A sudden, severe headache with no known cause.  Nausea or vomiting.  Seizure. These symptoms may represent a serious problem that is an emergency. Do not wait to see if the symptoms will go away. Get medical help right away. Call your local emergency services (911 in the [place] Do not drive yourself to the hospital. Make note of the time that you had your first symptoms. Your emergency responders or emergency room staff will need to know this information. Summary  Being discharged from the hospital after a stroke can feel overwhelming. It is normal to feel scared or anxious.  Make sure you take medicines exactly as told by your health care provider.  Know the warning signs of a stroke, and get help right way if you have any of these symptoms. "BE FAST" is an easy way to remember the main warning signs of a stroke. This information is not intended to replace advice given to you by your health care provider. Make sure you discuss any questions you have with your health care provider. Document Revised: 03/10/2019 Document Reviewed: 09/20/2016 Elsevier Patient Education  2020 [employer]  

## 2020-05-18 DIAGNOSIS — J449 Chronic obstructive pulmonary disease, unspecified: Secondary | ICD-10-CM | POA: Diagnosis present

## 2020-05-18 DIAGNOSIS — J441 Chronic obstructive pulmonary disease with (acute) exacerbation: Secondary | ICD-10-CM | POA: Diagnosis present

## 2020-05-18 DIAGNOSIS — J438 Other emphysema: Secondary | ICD-10-CM | POA: Insufficient documentation

## 2020-06-05 ENCOUNTER — Encounter: Payer: Self-pay | Admitting: Internal Medicine

## 2020-06-05 ENCOUNTER — Ambulatory Visit: Payer: Medicare Other | Admitting: Internal Medicine

## 2020-06-05 ENCOUNTER — Other Ambulatory Visit: Payer: Self-pay

## 2020-06-05 VITALS — BP 138/92 | HR 84 | Ht 72.0 in | Wt 178.4 lb

## 2020-06-05 DIAGNOSIS — I639 Cerebral infarction, unspecified: Secondary | ICD-10-CM | POA: Diagnosis not present

## 2020-06-05 DIAGNOSIS — Z720 Tobacco use: Secondary | ICD-10-CM | POA: Diagnosis not present

## 2020-06-05 DIAGNOSIS — E782 Mixed hyperlipidemia: Secondary | ICD-10-CM | POA: Diagnosis not present

## 2020-06-05 HISTORY — PX: OTHER SURGICAL HISTORY: SHX169

## 2020-06-05 NOTE — Progress Notes (Signed)
Electrophysiology Office Note   Date:  06/05/2020   ID:  Adrian Rodgers, DOB [birth year], MRN [id number]  PCP:  [facility]    Primary Electrophysiologist: Hillis Range, MD    CC: stroke   History of Present Illness: Adrian Rodgers is a 79 y.o. male who presents today for electrophysiology evaluation.   He is referred by Dr Roda Shutters for EP consultation regarding possible arrhythmogenic causes for stroke. The patient presented 05/13/20 with acute stroke. Workup has been unrevealing.   Today, he denies symptoms of palpitations, chest pain, shortness of breath, orthopnea, PND, lower extremity edema, claudication, dizziness, presyncope, syncope, bleeding, or neurologic sequela. The patient is tolerating medications without difficulties and is otherwise without complaint today.    Past Medical History:  Diagnosis Date  . High cholesterol   . Hypertension    History reviewed. No pertinent surgical history.   Current Outpatient Medications  Medication Sig Dispense Refill  . acetaminophen (TYLENOL) 500 MG tablet Take 500-1,000 mg by mouth every 8 (eight) hours as needed for mild pain (or headaches).     Marland Kitchen albuterol (PROVENTIL HFA;VENTOLIN HFA) 108 (90 Base) MCG/ACT inhaler Inhale 2 puffs into the lungs every 6 (six) hours as needed for wheezing or shortness of breath. 1 Inhaler 2  . aspirin EC 81 MG EC tablet Take 1 tablet (81 mg total) by mouth daily. Swallow whole. 30 tablet 11  . losartan (COZAAR) 50 MG tablet Take 50 mg by mouth daily.    . Multiple Vitamins-Minerals (CENTRUM SILVER 50+MEN) TABS Take 1 tablet by mouth daily with breakfast.    . Spacer/Aero Chamber Mouthpiece MISC 1 Units by Does not apply route every 4 (four) hours as needed. 1 each 0  . atorvastatin (LIPITOR) 80 MG tablet Take 80 mg by mouth daily.    Marland Kitchen buPROPion (WELLBUTRIN SR) 150 MG 12 hr tablet Take 1 tablet by mouth in the morning and at bedtime.     No current facility-administered medications  for this visit.    Allergies:   Patient has no known allergies.   Social History:  The patient  reports that he has been smoking cigarettes. He has a 20.00 pack-year smoking history. He has never used smokeless tobacco. He reports current alcohol use of about 1.0 standard drink of alcohol per week. He reports that he does not use drugs.   Family History:  The patient's  Family history is unknown by patient.   Mother died when he was 8 months old.   ROS:  Please see the history of present illness.   All other systems are personally reviewed and negative.    PHYSICAL EXAM: VS:  BP (!) 138/92   Pulse 84   Ht 6' (1.829 m)   Wt 178 lb 6.4 oz (80.9 kg)   SpO2 93%   BMI 24.20 kg/m  , BMI Body mass index is 24.2 kg/m. GEN: Well nourished, well developed, in no acute distress HEENT: normal Neck: no JVD, carotid bruits, or masses Cardiac: RRR; no murmurs, rubs, or gallops,no edema  Respiratory:  clear to auscultation bilaterally, normal work of breathing GI: soft, nontender, nondistended, + BS MS: no deformity or atrophy Skin: warm and dry  Neuro:  Strength and sensation are intact Psych: euthymic mood, full affect  EKG:  EKG is ordered today. The ekg ordered today is personally reviewed and shows sinus rhythm   Recent Labs: 05/12/2020: ALT 15; BUN 18; Potassium 3.5; Sodium 142 05/13/2020: Creatinine, Ser 0.77; Hemoglobin 13.6; Platelets  314  personally reviewed   Lipid Panel     Component Value Date/Time   CHOL 124 05/13/2020 0402   TRIG 53 05/13/2020 0402   HDL 38 (L) 05/13/2020 0402   CHOLHDL 3.3 05/13/2020 0402   VLDL 11 05/13/2020 0402   LDLCALC 75 05/13/2020 0402   personally reviewed   Wt Readings from Last 3 Encounters:  06/05/20 178 lb 6.4 oz (80.9 kg)  05/12/20 183 lb (83 kg)  09/19/17 176 lb 5.9 oz (80 kg)      Other studies personally reviewed: Additional studies/ records that were reviewed today include: prior ekgs, echo , Dr Warren Danes notes, hospital  records Review of the above records today demonstrates: as above  Assessment and Plan:  1. Cryptogenic stroke The patient presents with cryptogenic stroke.   I spoke at length with the patient about monitoring for afib with either a 30 day event monitor or an implantable loop recorder.  Risks, benefits, and alteratives to implantable loop recorder were discussed with the patient today.   At this time, the patient is very clear in their decision to proceed with implantable loop recorder.    2. HTN Stable No change required today  3. HL Continue statin  4. Tobacco Cessation advised   Signed, Hillis Range, MD  06/05/2020 10:34 AM     [facility] [address] [place] [address] [phone] (office) [phone] (fax)    PROCEDURES:   1. Implantable loop recorder implantation        DESCRIPTION OF PROCEDURE:  Informed written consent was obtained.  The patient required no sedation for the procedure today.  The patients left chest was prepped and draped. Mapping over the patient's chest was performed to identify the appropriate ILR site.  This area was found to be the left parasternal region over the 3rd-4th intercostal space.  The skin overlying this region was infiltrated with lidocaine for local analgesia.  A 0.5-cm incision was made at the implant site.  A subcutaneous ILR pocket was fashioned using a combination of sharp and blunt dissection.  A Medtronic Reveal Linq model [id number]) implantable loop recorder was then placed into the pocket R waves were very prominent and measured > 0.2 mV. EBL<1 ml.  Steri- Strips and a sterile dressing were then applied.  There were no early apparent complications.     CONCLUSIONS:   1. Successful implantation of a Medtronic Reveal LINQ implantable loop recorder for cryptogenic stroke  2. No early apparent complications.   Hillis Range MD, [facility] 06/05/2020 11:03 AM

## 2020-06-05 NOTE — Patient Instructions (Addendum)
Medication Instructions:  Your physician recommends that you continue on your current medications as directed. Please refer to the Current Medication list given to you today.  Labwork: None ordered.  Testing/Procedures: None ordered.     Your physician wants you to follow-up in: as needed     Implantable Loop Recorder Placement, Care After This sheet gives you information about how to care for yourself after your procedure. Your health care provider may also give you more specific instructions. If you have problems or questions, contact your health care provider. What can I expect after the procedure? After the procedure, it is common to have:  Soreness or discomfort near the incision.  Some swelling or bruising near the incision.  Follow these instructions at home: Incision care  1.  Leave your outer dressing on for 24 hours.  After 24 hours you can remove your outer dressing and shower. 2. Leave adhesive strips in place. These skin closures may need to stay in place for 1-2 weeks. If adhesive strip edges start to loosen and curl up, you may trim the loose edges.  You may remove the strips if they have not fallen off after 2 weeks. 3. Check your incision area every day for signs of infection. Check for: a. Redness, swelling, or pain. b. Fluid or blood. c. Warmth. d. Pus or a bad smell. 4. Do not take baths, swim, or use a hot tub until your incision is completely healed. 5. If your wound site starts to bleed apply pressure.      If you have any questions/concerns please call the device clinic at [phone].  Activity  Return to your normal activities.  General instructions  Follow instructions from your health care provider about how to manage your implantable loop recorder and transmit the information. Learn how to activate a recording if this is necessary for your type of device.  Do not go through a metal detection gate, and do not let someone hold a metal  detector over your chest. Show your ID card.  Do not have an MRI unless you check with your health care provider first.  Take over-the-counter and prescription medicines only as told by your health care provider.  Keep all follow-up visits as told by your health care provider. This is important. Contact a health care provider if:  You have redness, swelling, or pain around your incision.  You have a fever.  You have pain that is not relieved by your pain medicine.  You have triggered your device because of fainting (syncope) or because of a heartbeat that feels like it is racing, slow, fluttering, or skipping (palpitations). Get help right away if you have:  Chest pain.  Difficulty breathing. Summary  After the procedure, it is common to have soreness or discomfort near the incision.  Change your dressing as told by your health care provider.  Follow instructions from your health care provider about how to manage your implantable loop recorder and transmit the information.  Keep all follow-up visits as told by your health care provider. This is important. This information is not intended to replace advice given to you by your health care provider. Make sure you discuss any questions you have with your health care provider. Document Released: 05/29/2015 Document Revised: 08/02/2017 Document Reviewed: 08/02/2017 Elsevier Patient Education  2020 Elsevier Inc.  

## 2020-06-07 NOTE — Addendum Note (Signed)
Addended by: Solon Augusta on: 06/07/2020 07:55 AM   Modules accepted: Orders

## 2020-06-13 ENCOUNTER — Encounter: Payer: Self-pay | Admitting: Adult Health

## 2020-06-13 ENCOUNTER — Telehealth (INDEPENDENT_AMBULATORY_CARE_PROVIDER_SITE_OTHER): Payer: Medicare Other | Admitting: Adult Health

## 2020-06-13 DIAGNOSIS — I10 Essential (primary) hypertension: Secondary | ICD-10-CM

## 2020-06-13 DIAGNOSIS — E785 Hyperlipidemia, unspecified: Secondary | ICD-10-CM

## 2020-06-13 DIAGNOSIS — I69398 Other sequelae of cerebral infarction: Secondary | ICD-10-CM | POA: Diagnosis not present

## 2020-06-13 DIAGNOSIS — I69328 Other speech and language deficits following cerebral infarction: Secondary | ICD-10-CM | POA: Diagnosis not present

## 2020-06-13 DIAGNOSIS — Z720 Tobacco use: Secondary | ICD-10-CM

## 2020-06-13 DIAGNOSIS — I639 Cerebral infarction, unspecified: Secondary | ICD-10-CM | POA: Diagnosis not present

## 2020-06-13 DIAGNOSIS — R269 Unspecified abnormalities of gait and mobility: Secondary | ICD-10-CM

## 2020-06-13 NOTE — Progress Notes (Signed)
[facility] [address] [place]. [place] [address] [phone]       HOSPITAL FOLLOW UP NOTE  Mr. Juquan Reznick Date of Birth:  31-Jan-1941 Medical Record Number:  [id number]   Reason for Referral:  hospital stroke follow up   Virtual Visit via Video Note  I connected with Selmer Dominion on 06/13/20 at  8:15 AM EST by a video enabled telemedicine application located remotely in my own home and verified that I am speaking with the correct person using two identifiers who was located at their own home accompianed by his daughter Patsy Lager.   I discussed the limitations of evaluation and management by telemedicine and the availability of in person appointments. The patient expressed understanding and agreed to proceed.   SUBJECTIVE:   HPI:   Mr. Davionte Lusby is a 79 y.o. male with history of  HTN, tobacco use, and HLD who presented on 05/12/2020 with left leg weakness.   Personally reviewed hospitalization pertinent progress notes, lab work and imaging with summary provided.  Evaluated by Dr. Roda Shutters with stroke work-up revealing multiple small acute infarcts in the distal right ACA territory, embolic secondary to unknown source.  Recommended follow-up outpatient with Dr. Johney Frame for placement of loop recorder to assess for A. fib as possible etiology.  Initiated DAPT for 3 weeks and aspirin alone.  Hx of HTN stable on Cozaar.  History of HLD on atorvastatin 40 mg daily PTA with LDL 75 and increased dosage to 80 mg daily.  Current tobacco use with smoking cessation counseling provided.  Other stroke risk factors include advanced age and EtOH use but no prior stroke history.  Other active problems include aortic arthrosclerosis and abnormal CTA of left lobe with repeat CTA 6 to 12 months recommended.  Evaluated by therapies and discharged home in stable condition without therapy needs.  Stroke: multiple small acute infarcts in the distal Right ACA territory - embolic -  source unknown.  CT head - Chronic atrophic and ischemic changes without acute abnormality.   MRI head - Multiple small acute infarcts in the distal Right ACA territory, including medial motor strip and/or pre motor involvement in the lower extremity representation area.   MRA head - Diminished flow in the right anterior cerebral artery beginning at the very close all level. Some small flow does persist. This is likely due to embolic disease.   CTA H&N - Decreased caliber of the right pericallosal artery with diminished flow compared to the left. 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   2D Echo - EF 60-65%  LE venous doppler not performed - Low suspicious for DVT at this time  Will arrange outpt loop recorder to rule out afib - will contact Dr. Monica Becton Virus 2  - negative  LDL - 75  HgbA1c - 6.2  VTE prophylaxis - SCDs  No antithrombotic prior to admission, now on aspirin 81 mg daily and clopidogrel 75 mg daily DAPT for 3 weeks and then ASA alone.   Patient counseled to be compliant with his antithrombotic medications  Ongoing aggressive stroke risk factor management  Therapy recommendations:  None  Disposition:  Home today  Today, 06/13/2020, Mr. Dirr is being seen via MyChart virtual visit for hospital follow-up accompanied by his daughter. Reports residual left leg weakness requiring use of RW which he was not using PTA. He also reports occasional slurred speech and word finding difficulty noticed by family. He is interested in doing therapy at this time.  Also questions return to driving.  Denies cognitive or memory concerns.  Lives alone and able to do ADLs and majority of IADLs independently with some assistance by daughter.  Denies new or worsening stroke/TIA symptoms.  Completed 3 weeks DAPT and remains on aspirin alone without bleeding or bruising.  Remains on atorvastatin without myalgias.  Blood pressure not routinely monitored at home.  Currently on  bupropion 150mg  BID for smoking cessation which has been beneficial currently smoking 1 cigarette/day but daughter concerned regarding potential side effect "he is like a zombie a couple hours after taking".  This has not been further discussed with prescribing provider.  He did have a loop recorder placed on 06/05/2020 by Dr. 14/11/2019 with initial download scheduled 07/11/2019.  No further concerns at this time.     ROS:   14 system review of systems performed and negative with exception of those listed in HPI  PMH:  Past Medical History:  Diagnosis Date  . High cholesterol   . Hypertension     PSH:  Past Surgical History:  Procedure Laterality Date  . implantable loop recorder placement  06/05/2020   Medtronic Reveal [place] model [place] [phone] [id number]) implantable loop recorder     Social History:  Social History   Socioeconomic History  . Marital status: Divorced    Spouse name: Not on file  . Number of children: Not on file  . Years of education: Not on file  . Highest education level: Not on file  Occupational History  . Not on file  Tobacco Use  . Smoking status: Current Every Day Smoker    Packs/day: 1.00    Years: 20.00    Pack years: 20.00    Types: Cigarettes  . Smokeless tobacco: Never Used  Substance and Sexual Activity  . Alcohol use: Yes    Alcohol/week: 1.0 standard drink    Types: 1 Cans of beer per week  . Drug use: Never  . Sexual activity: Not on file  Other Topics Concern  . Not on file  Social History Narrative   Lives with brother-in law   Social Determinants of Health   Financial Resource Strain: Not on file  Food Insecurity: Not on file  Transportation Needs: Not on file  Physical Activity: Not on file  Stress: Not on file  Social Connections: Not on file  Intimate Partner Violence: Not on file    Family History:  Family History  Family history unknown: Yes    Medications:   Current Outpatient Medications on File Prior to Visit   Medication Sig Dispense Refill  . acetaminophen (TYLENOL) 500 MG tablet Take 500-1,000 mg by mouth every 8 (eight) hours as needed for mild pain (or headaches).     ([id number] albuterol (PROVENTIL HFA;VENTOLIN HFA) 108 (90 Base) MCG/ACT inhaler Inhale 2 puffs into the lungs every 6 (six) hours as needed for wheezing or shortness of breath. 1 Inhaler 2  . aspirin EC 81 MG EC tablet Take 1 tablet (81 mg total) by mouth daily. Swallow whole. 30 tablet 11  . atorvastatin (LIPITOR) 80 MG tablet Take 80 mg by mouth daily.    Marland Kitchen buPROPion (WELLBUTRIN SR) 150 MG 12 hr tablet Take 1 tablet by mouth in the morning and at bedtime.    Marland Kitchen losartan (COZAAR) 50 MG tablet Take 50 mg by mouth daily.    . Multiple Vitamins-Minerals (CENTRUM SILVER 50+MEN) TABS Take 1 tablet by mouth daily with breakfast.    . Spacer/Aero Chamber Mouthpiece MISC 1  Units by Does not apply route every 4 (four) hours as needed. 1 each 0   No current facility-administered medications on file prior to visit.    Allergies:  No Known Allergies    OBJECTIVE:  Physical Exam  General: well developed, well nourished, pleasant elderly African-American male, seated, in no evident distress Head: head normocephalic and atraumatic.    Neurologic Exam Mental Status: Awake and fully alert.  Unable to appreciate aphasia or dysarthria.  Oriented to place and time. Recent and remote memory intact. Attention span, concentration and fund of knowledge appropriate. Mood and affect appropriate.  Cranial Nerves: Extraocular movements full without nystagmus. Hearing intact to voice. Facial sensation intact. Face, tongue, palate moves normally and symmetrically.  Shoulder shrug symmetric. Motor: No evidence of weakness per drift assessment Sensory.: intact to light touch Coordination: Rapid alternating movements normal in all extremities. Finger-to-nose and heel-to-shin performed accurately bilaterally.    NIHSS  0 Modified Rankin   2      ASSESSMENT: Osceola Depaz is a 79 y.o. 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 Vascular risk factors include HTN, HLD, EtOH use, advanced age and tobacco use.      PLAN:  1. R ACA stroke, cryptogenic:  a. Residual deficit: Subjective left leg weakness and occasional slurred speech and word finding difficulty. i. Referral placed to rehab without walls PT/SLP.   ii. Advised use of RW AAT unless further instructed by therapist.   iii. Residual deficits would not interfere with driving or driving safety concerns therefore cleared to return to driving with graduated return to driving basis. Graduated return to driving instructions were provided. It is recommended that the patient first drives with another licensed driver in an empty parking lot. If the patient does well, they can drive on a quiet street with the licensed driver. If the patient does well, they can drive on a busy street with a licensed driver.  If patient continues to do well, patient will be cleared to drive independently.  For the first month after resuming driving, it is recommend no nighttime, busy/heavy traffic roads or Interstate driving.  b. Continue aspirin 81 mg daily  and atorvastatin 80 mg daily for secondary stroke prevention.  c. Loop recorder placed 06/05/2020 by Dr. Johney Frame.  Initial download scheduled 07/10/2020.  Will be continuously monitored by cardiology d. Interested in [place] trial - will notify [facility] research team to provide patient with additional information for further consideration e. Discussed secondary stroke prevention measures and importance of close PCP follow up for aggressive stroke risk factor management  2. HTN: BP goal <130/90. Continue f/u with PCP 3. HLD: LDL goal <70. Recent LDL 75. F/u with PCP for management as well as prescribing of  statin 4. Tobacco use: Discussed importance of complete cessation.  Advised to further discuss bupropion side effect concerns with prescribing provider Dr. Coralee Pesa    Follow up in 3 months or call earlier if needed   CC:  [facility] provider: Dr. Pearlean Brownie [facility]    I spent 45 minutes of non-face-to-face time via virtual visit with patient and daughter.  This included previsit chart review including hospitalization pertinent progress notes, lab work and imaging, lab review, study review, order entry, electronic health record documentation, patient education regarding recent stroke, residual deficits, importance of managing stroke risk factors and answered all questions to patient and  daughters satisfaction   Ihor AustinJessica McCue, AGNP-BC  [facility] [address] [place], [place] [id number]  Phone [phone] Fax [phone] Note: This document was prepared with digital dictation and possible smart phrase technology. Any transcriptional errors that result from this process are unintentional.

## 2020-06-20 NOTE — Progress Notes (Signed)
I agree with the above plan 

## 2020-07-10 ENCOUNTER — Ambulatory Visit (INDEPENDENT_AMBULATORY_CARE_PROVIDER_SITE_OTHER): Payer: Medicare Other

## 2020-07-10 DIAGNOSIS — I639 Cerebral infarction, unspecified: Secondary | ICD-10-CM | POA: Diagnosis not present

## 2020-07-10 LAB — CUP PACEART REMOTE DEVICE CHECK
Date Time Interrogation Session: 20220108100650
Implantable Pulse Generator Implant Date: 20211206

## 2020-07-25 NOTE — Progress Notes (Signed)
[facility] Summary Report / Loop Recorder 

## 2020-08-10 LAB — CUP PACEART REMOTE DEVICE CHECK
Date Time Interrogation Session: 20220210100908
Implantable Pulse Generator Implant Date: 20211206

## 2020-08-12 ENCOUNTER — Ambulatory Visit (INDEPENDENT_AMBULATORY_CARE_PROVIDER_SITE_OTHER): Payer: Medicare Other

## 2020-08-12 DIAGNOSIS — I639 Cerebral infarction, unspecified: Secondary | ICD-10-CM

## 2020-09-11 NOTE — Progress Notes (Signed)
[facility] Summary Report / Loop Recorder 

## 2020-09-14 ENCOUNTER — Ambulatory Visit (INDEPENDENT_AMBULATORY_CARE_PROVIDER_SITE_OTHER): Payer: Medicare Other

## 2020-09-14 ENCOUNTER — Ambulatory Visit: Payer: Medicare Other | Admitting: Adult Health

## 2020-09-14 ENCOUNTER — Encounter: Payer: Self-pay | Admitting: Adult Health

## 2020-09-14 DIAGNOSIS — I639 Cerebral infarction, unspecified: Secondary | ICD-10-CM

## 2020-09-14 LAB — CUP PACEART REMOTE DEVICE CHECK
Date Time Interrogation Session: 20220315111149
Implantable Pulse Generator Implant Date: 20211206

## 2020-09-14 NOTE — Progress Notes (Deleted)
[facility] [address] [place]. [place] [address] [phone]       STROKE FOLLOW UP NOTE  [name] Date of Birth:  1941-02-07 Medical Record Number:  [id number]   Reason for Referral:  stroke follow up     SUBJECTIVE:   HPI:   Today, 09/14/2020, Adrian Rodgers is being seen for 66-month stroke follow-up.  Doing well from stroke standpoint without new or reoccurring stroke/TIA symptoms Reports full recovery and denies residual deficits  Reports compliance on aspirin 81 mg daily and atorvastatin 80 mg daily -denies side effects Blood pressure today *** Continued tobacco use by 1.5 packs/day  Loop recorder has not shown atrial fibrillation thus far  No concerns at this time    History provided for reference purposes only Initial visit 06/13/2020 JM: Adrian Rodgers is being seen via MyChart virtual visit for hospital follow-up accompanied by his daughter. Reports residual left leg weakness requiring use of RW which he was not using PTA. He also reports occasional slurred speech and word finding difficulty noticed by family. He is interested in doing therapy at this time.  Also questions return to driving.  Denies cognitive or memory concerns.  Lives alone and able to do ADLs and majority of IADLs independently with some assistance by daughter.  Denies new or worsening stroke/TIA symptoms.  Completed 3 weeks DAPT and remains on aspirin alone without bleeding or bruising.  Remains on atorvastatin without myalgias.  Blood pressure not routinely monitored at home.  Currently on bupropion 150mg  BID for smoking cessation which has been beneficial currently smoking 1 cigarette/day but daughter concerned regarding potential side effect "he is like a zombie a couple hours after taking".  This has not been further discussed with prescribing provider.  He did have a loop recorder placed on 06/05/2020 by Dr. 14/11/2019 with initial download scheduled 07/11/2019.  No further  concerns at this time.  Stroke admission 05/12/2020 Adrian Rodgers is a 80 y.o. male with history of  HTN, tobacco use, and HLD who presented on 05/12/2020 with left leg weakness.   Personally reviewed hospitalization pertinent progress notes, lab work and imaging with summary provided.  Evaluated by Dr. 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  Recommended follow-up outpatient with Dr. Roda Shutters for placement of loop recorder to assess for A. fib as possible etiology.  Initiated DAPT for 3 weeks and aspirin alone.  Hx of HTN stable on Cozaar.  History of HLD on atorvastatin 40 mg daily PTA with LDL 75 and increased dosage to 80 mg daily.  Current tobacco use with smoking cessation counseling provided.  Other stroke risk factors include advanced age and EtOH use but no prior stroke history.  Other active problems include aortic arthrosclerosis and abnormal CTA of left lobe with repeat CTA 6 to 12 months recommended.  Evaluated by therapies and discharged home in stable condition without therapy needs.  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  CT head - Chronic atrophic and ischemic changes without acute abnormality.   MRI head - Multiple small acute infarcts in the distal Right ACA territory, including medial motor strip and/or pre motor involvement in the lower extremity representation area.   MRA head - Diminished flow in the right anterior cerebral artery beginning at the very close all level. Some small flow does persist. This is likely due to embolic disease.  CTA H&N - Decreased caliber of the right pericallosal artery with diminished flow compared to the left. 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   2D Echo - EF 60-65%  LE venous doppler not performed - Low suspicious for DVT at this time  Will arrange outpt loop recorder to rule out  afib - will contact Dr. Monica Becton Virus 2  - negative  LDL - 75  HgbA1c - 6.2  VTE prophylaxis - SCDs  No antithrombotic prior to admission, now on aspirin 81 mg daily and clopidogrel 75 mg daily DAPT for 3 weeks and then ASA alone.   Patient counseled to be compliant with his antithrombotic medications  Ongoing aggressive stroke risk factor management  Therapy recommendations:  None  Disposition:  Home today       ROS:   14 system review of systems performed and negative with exception of those listed in HPI  PMH:  Past Medical History:  Diagnosis Date  . High cholesterol   . Hypertension     PSH:  Past Surgical History:  Procedure Laterality Date  . implantable loop recorder placement  06/05/2020   Medtronic Reveal [place] model [id number]) implantable loop recorder     Social History:  Social History   Socioeconomic History  . Marital status: Divorced    Spouse name: Not on file  . Number of children: Not on file  . Years of education: Not on file  . Highest education level: Not on file  Occupational History  . Not on file  Tobacco Use  . Smoking status: Current Every Day Smoker    Packs/day: 1.00    Years: 20.00    Pack years: 20.00    Types: Cigarettes  . Smokeless tobacco: Never Used  Substance and Sexual Activity  . Alcohol use: Yes    Alcohol/week: 1.0 standard drink    Types: 1 Cans of beer per week  . Drug use: Never  . Sexual activity: Not on file  Other Topics Concern  . Not on file  Social History Narrative   Lives with brother-in law   Social Determinants of Health   Financial Resource Strain: Not on file  Food Insecurity: Not on file  Transportation Needs: Not on file  Physical Activity: Not on file  Stress: Not on file  Social Connections: Not on file  Intimate Partner Violence: Not on file    Family History:  Family History  Family history unknown: Yes    Medications:   Current Outpatient  Medications on File Prior to Visit  Medication Sig Dispense Refill  . acetaminophen (TYLENOL) 500 MG tablet Take 500-1,000 mg by mouth every 8 (eight) hours as needed for mild pain (or headaches).     Marland Kitchen albuterol (PROVENTIL HFA;VENTOLIN HFA) 108 (90 Base) MCG/ACT inhaler Inhale 2 puffs into the lungs every 6 (six) hours as needed for wheezing or shortness of breath. 1 Inhaler 2  . aspirin EC 81 MG EC tablet Take 1 tablet (81 mg total) by mouth daily. Swallow whole. 30 tablet 11  . atorvastatin (LIPITOR) 80 MG tablet Take 80 mg by mouth daily.    Marland Kitchen buPROPion (WELLBUTRIN SR) 150 MG 12 hr tablet Take 1 tablet by mouth in the morning and at bedtime.    Marland Kitchen losartan (COZAAR) 50 MG tablet Take 50 mg by mouth daily.    . Multiple Vitamins-Minerals (CENTRUM SILVER 50+MEN) TABS Take 1 tablet by mouth daily with breakfast.    . Spacer/Aero Chamber  Mouthpiece MISC 1 Units by Does not apply route every 4 (four) hours as needed. 1 each 0   No current facility-administered medications on file prior to visit.    Allergies:  No Known Allergies    OBJECTIVE: There were no vitals filed for this visit. There is no height or weight on file to calculate BMI.  General: well developed, well nourished, seated, in no evident distress Head: head normocephalic and atraumatic.   Neck: supple with no carotid or supraclavicular bruits Cardiovascular: regular rate and rhythm, no murmurs Musculoskeletal: no deformity Skin:  no rash/petichiae Vascular:  Normal pulses all extremities   Neurologic Exam Mental Status: Awake and fully alert. Oriented to place and time. Recent and remote memory intact. Attention span, concentration and fund of knowledge appropriate. Mood and affect appropriate.  Cranial Nerves: Fundoscopic exam reveals sharp disc margins. Pupils equal, briskly reactive to light. Extraocular movements full without nystagmus. Visual fields full to confrontation. Hearing intact. Facial sensation intact. Face,  tongue, palate moves normally and symmetrically.  Motor: Normal bulk and tone. Normal strength in all tested extremity muscles Sensory.: intact to touch , pinprick , position and vibratory sensation.  Coordination: Rapid alternating movements normal in all extremities. Finger-to-nose and heel-to-shin performed accurately bilaterally. Gait and Station: Arises from chair without difficulty. Stance is normal. Gait demonstrates normal stride length and balance ***. Tandem walk and heel toe ***.  Reflexes: 1+ and symmetric. Toes downgoing.          ASSESSMENT: Adrian Rodgers is a 80 y.o. 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 Vascular risk factors include HTN, HLD, EtOH use, advanced age and tobacco use.      PLAN:  1. R ACA stroke, cryptogenic:  a. Residual deficit: Subjective left leg weakness and occasional slurred speech and word finding difficulty. i. Referral placed to [facility]hab [facility] PT/SLP.   ii. Advised use of RW AAT unless further instructed by therapist.   b. Continue aspirin 81 mg daily  and atorvastatin 80 mg daily for secondary stroke prevention.  c. Loop recorder has not shown atrial fibrillation thus far -continuously monitored by cardiology d. Discussed secondary stroke prevention measures and importance of close PCP follow up for aggressive stroke risk factor management  2. HTN: BP goal <130/90.  Well-controlled on losartan per PCP 3. HLD: LDL goal <70. Recent LDL 75. F/u with PCP for management as well as prescribing of statin 4. Tobacco use: Discussed importance of complete cessation.  Advised to further discuss bupropion side effect concerns with prescribing provider Dr. Coralee Pesa    Follow up in 3 months or call earlier if needed   CC:  [facility] provider: Dr. Pearle[facility], [facility]    I spent 45  minutes of non-face-to-face time via virtual visit with patient and daughter.  This included previsit chart review, lab review, study review, order entry, electronic health record documentation, patient education regarding recent stroke, residual deficits, importance of managing stroke risk factors and answered all questions to patient and daughters satisfaction   Ihor Austin, [facility]  [facility] [address] [place], [place] [address]  Phone [phone] Fax [phone] Note: This document was prepared with digital dictation and possible smart phrase technology. Any transcriptional errors that result from this process are unintentional.

## 2020-09-22 DIAGNOSIS — I7121 Aneurysm of the ascending aorta, without rupture: Secondary | ICD-10-CM | POA: Insufficient documentation

## 2020-09-22 NOTE — Progress Notes (Signed)
[facility] Summary Report / Loop Recorder 

## 2020-10-17 ENCOUNTER — Ambulatory Visit (INDEPENDENT_AMBULATORY_CARE_PROVIDER_SITE_OTHER): Payer: Medicare Other

## 2020-10-17 DIAGNOSIS — I639 Cerebral infarction, unspecified: Secondary | ICD-10-CM | POA: Diagnosis not present

## 2020-10-17 LAB — CUP PACEART REMOTE DEVICE CHECK
Date Time Interrogation Session: 20220417111232
Implantable Pulse Generator Implant Date: 20211206

## 2020-11-03 NOTE — Progress Notes (Signed)
[facility] Summary Report / Loop Recorder 

## 2020-11-20 ENCOUNTER — Ambulatory Visit (INDEPENDENT_AMBULATORY_CARE_PROVIDER_SITE_OTHER): Payer: Medicare Other

## 2020-11-20 DIAGNOSIS — I639 Cerebral infarction, unspecified: Secondary | ICD-10-CM | POA: Diagnosis not present

## 2020-11-22 LAB — CUP PACEART REMOTE DEVICE CHECK
Date Time Interrogation Session: 20220520111836
Implantable Pulse Generator Implant Date: 20211206

## 2020-12-11 NOTE — Progress Notes (Signed)
[facility] Summary Report / Loop Recorder 

## 2020-12-20 ENCOUNTER — Ambulatory Visit (INDEPENDENT_AMBULATORY_CARE_PROVIDER_SITE_OTHER): Payer: Medicare Other

## 2020-12-20 DIAGNOSIS — I639 Cerebral infarction, unspecified: Secondary | ICD-10-CM | POA: Diagnosis not present

## 2020-12-20 LAB — CUP PACEART REMOTE DEVICE CHECK
Date Time Interrogation Session: 20220622112045
Implantable Pulse Generator Implant Date: 20211206

## 2021-01-09 NOTE — Progress Notes (Signed)
[facility] Summary Report / Loop Recorder 

## 2021-01-22 ENCOUNTER — Ambulatory Visit (INDEPENDENT_AMBULATORY_CARE_PROVIDER_SITE_OTHER): Payer: Medicare Other

## 2021-01-22 DIAGNOSIS — I639 Cerebral infarction, unspecified: Secondary | ICD-10-CM

## 2021-01-23 LAB — CUP PACEART REMOTE DEVICE CHECK
Date Time Interrogation Session: 20220725112246
Implantable Pulse Generator Implant Date: 20211206

## 2021-02-16 NOTE — Progress Notes (Signed)
[facility] Summary Report / Loop Recorder 

## 2021-02-26 ENCOUNTER — Ambulatory Visit (INDEPENDENT_AMBULATORY_CARE_PROVIDER_SITE_OTHER): Payer: Medicare Other

## 2021-02-26 DIAGNOSIS — I639 Cerebral infarction, unspecified: Secondary | ICD-10-CM

## 2021-02-27 LAB — CUP PACEART REMOTE DEVICE CHECK
Date Time Interrogation Session: 20220827112712
Implantable Pulse Generator Implant Date: 20211206

## 2021-03-08 NOTE — Progress Notes (Signed)
[facility] Summary Report / Loop Recorder 

## 2021-03-25 ENCOUNTER — Observation Stay (HOSPITAL_BASED_OUTPATIENT_CLINIC_OR_DEPARTMENT_OTHER)
Admission: EM | Admit: 2021-03-25 | Discharge: 2021-03-27 | Disposition: A | Discharge status: Home / Self Care | Payer: Medicare Other | Attending: Internal Medicine | Admitting: Internal Medicine

## 2021-03-25 ENCOUNTER — Other Ambulatory Visit: Payer: Self-pay

## 2021-03-25 ENCOUNTER — Emergency Department (HOSPITAL_BASED_OUTPATIENT_CLINIC_OR_DEPARTMENT_OTHER): Payer: Medicare Other

## 2021-03-25 DIAGNOSIS — Z7982 Long term (current) use of aspirin: Secondary | ICD-10-CM | POA: Diagnosis not present

## 2021-03-25 DIAGNOSIS — J449 Chronic obstructive pulmonary disease, unspecified: Secondary | ICD-10-CM | POA: Diagnosis not present

## 2021-03-25 DIAGNOSIS — F17210 Nicotine dependence, cigarettes, uncomplicated: Secondary | ICD-10-CM | POA: Diagnosis not present

## 2021-03-25 DIAGNOSIS — Y900 Blood alcohol level of less than 20 mg/100 ml: Secondary | ICD-10-CM | POA: Diagnosis not present

## 2021-03-25 DIAGNOSIS — R41841 Cognitive communication deficit: Secondary | ICD-10-CM | POA: Insufficient documentation

## 2021-03-25 DIAGNOSIS — G459 Transient cerebral ischemic attack, unspecified: Secondary | ICD-10-CM | POA: Diagnosis not present

## 2021-03-25 DIAGNOSIS — I10 Essential (primary) hypertension: Secondary | ICD-10-CM | POA: Insufficient documentation

## 2021-03-25 DIAGNOSIS — Z20822 Contact with and (suspected) exposure to covid-19: Secondary | ICD-10-CM | POA: Insufficient documentation

## 2021-03-25 DIAGNOSIS — I69354 Hemiplegia and hemiparesis following cerebral infarction affecting left non-dominant side: Secondary | ICD-10-CM | POA: Insufficient documentation

## 2021-03-25 DIAGNOSIS — R531 Weakness: Secondary | ICD-10-CM | POA: Diagnosis present

## 2021-03-25 DIAGNOSIS — Z79899 Other long term (current) drug therapy: Secondary | ICD-10-CM | POA: Insufficient documentation

## 2021-03-25 DIAGNOSIS — Z8673 Personal history of transient ischemic attack (TIA), and cerebral infarction without residual deficits: Secondary | ICD-10-CM | POA: Insufficient documentation

## 2021-03-25 LAB — CBC
HCT: 43.7 % (ref 39.0–52.0)
Hemoglobin: 14.6 g/dL (ref 13.0–17.0)
MCH: 31.3 pg (ref 26.0–34.0)
MCHC: 33.4 g/dL (ref 30.0–36.0)
MCV: 93.6 fL (ref 80.0–100.0)
Platelets: 383 10*3/uL (ref 150–400)
RBC: 4.67 MIL/uL (ref 4.22–5.81)
RDW: 14.1 % (ref 11.5–15.5)
WBC: 7.1 10*3/uL (ref 4.0–10.5)
nRBC: 0 % (ref 0.0–0.2)

## 2021-03-25 LAB — APTT: aPTT: 26 seconds (ref 24–36)

## 2021-03-25 LAB — CBG MONITORING, ED: Glucose-Capillary: 117 mg/dL — ABNORMAL HIGH (ref 70–99)

## 2021-03-25 LAB — RESP PANEL BY RT-PCR (FLU A&B, COVID) ARPGX2
Influenza A by PCR: NEGATIVE
Influenza B by PCR: NEGATIVE
SARS Coronavirus 2 by RT PCR: NEGATIVE

## 2021-03-25 LAB — DIFFERENTIAL
Abs Immature Granulocytes: 0.02 10*3/uL (ref 0.00–0.07)
Basophils Absolute: 0 10*3/uL (ref 0.0–0.1)
Basophils Relative: 0 %
Eosinophils Absolute: 0.3 10*3/uL (ref 0.0–0.5)
Eosinophils Relative: 5 %
Immature Granulocytes: 0 %
Lymphocytes Relative: 33 %
Lymphs Abs: 2.3 10*3/uL (ref 0.7–4.0)
Monocytes Absolute: 0.5 10*3/uL (ref 0.1–1.0)
Monocytes Relative: 7 %
Neutro Abs: 3.8 10*3/uL (ref 1.7–7.7)
Neutrophils Relative %: 55 %

## 2021-03-25 LAB — COMPREHENSIVE METABOLIC PANEL
ALT: 25 U/L (ref 0–44)
AST: 12 U/L — ABNORMAL LOW (ref 15–41)
Albumin: 4 g/dL (ref 3.5–5.0)
Alkaline Phosphatase: 77 U/L (ref 38–126)
Anion gap: 8 (ref 5–15)
BUN: 15 mg/dL (ref 8–23)
CO2: 25 mmol/L (ref 22–32)
Calcium: 9.2 mg/dL (ref 8.9–10.3)
Chloride: 104 mmol/L (ref 98–111)
Creatinine, Ser: 1.3 mg/dL — ABNORMAL HIGH (ref 0.61–1.24)
GFR, Estimated: 56 mL/min — ABNORMAL LOW (ref >60)
Glucose, Bld: 120 mg/dL — ABNORMAL HIGH (ref 70–99)
Potassium: 3.5 mmol/L (ref 3.5–5.1)
Sodium: 137 mmol/L (ref 135–145)
Total Bilirubin: 0.7 mg/dL (ref 0.3–1.2)
Total Protein: 7.3 g/dL (ref 6.5–8.1)

## 2021-03-25 LAB — ETHANOL: Alcohol, Ethyl (B): <10 mg/dL (ref <10)

## 2021-03-25 LAB — PROTIME-INR
INR: 1 (ref 0.8–1.2)
Prothrombin Time: 13 seconds (ref 11.4–15.2)

## 2021-03-25 MED ORDER — ASPIRIN 81 MG PO CHEW
243.0000 mg | CHEWABLE_TABLET | Freq: Once | ORAL | Status: AC
  Administered 2021-03-25: 243 mg via ORAL
  Filled 2021-03-25: qty 3

## 2021-03-25 MED ORDER — ASPIRIN 325 MG PO TABS
325.0000 mg | ORAL_TABLET | Freq: Once | ORAL | Status: DC
  Filled 2021-03-25: qty 1

## 2021-03-25 MED ORDER — IOHEXOL 350 MG/ML SOLN
100.0000 mL | Freq: Once | INTRAVENOUS | Status: AC | PRN
  Administered 2021-03-25: 100 mL via INTRAVENOUS

## 2021-03-25 MED ORDER — CLOPIDOGREL BISULFATE 300 MG PO TABS
300.0000 mg | ORAL_TABLET | Freq: Once | ORAL | Status: AC
  Administered 2021-03-25: 300 mg via ORAL
  Filled 2021-03-25: qty 1

## 2021-03-25 NOTE — ED Provider Notes (Signed)
[facility] EMERGENCY DEPARTMENT Provider Note   CSN: [id number] Arrival date & time: 03/25/21  1828     History Chief Complaint  Patient presents with   Extremity Weakness   Code Stroke    Adrian Rodgers is a 80 y.o. male history of hypertension, hyperlipidemia, previous stroke here presenting with left arm and leg weakness.  Patient states that he was in the kitchen today and suddenly had left leg weakness.  He states that he was unable to stand up afterwards.  He denies any back pain.  Denies any trauma or injury.  Patient had previous stroke with similar symptoms.  He was noted to have left arm drift as well.  Denies any trouble speaking. Patient is not on blood thinners currently  The history is provided by the patient.      Past Medical History:  Diagnosis Date   High cholesterol    Hypertension     Patient Active Problem List   Diagnosis Date Noted   Essential hypertension 05/13/2020   HLD (hyperlipidemia) 05/13/2020   Acute CVA (cerebrovascular accident) (HCC) 05/12/2020   Community acquired pneumonia of right middle lobe of lung 09/20/2017   Sepsis (HCC) 09/19/2017    Past Surgical History:  Procedure Laterality Date   implantable loop recorder placement  06/05/2020   Medtronic Reveal [place] model [id number] [phone]) implantable loop recorder        Family History  Family history unknown: Yes    Social History   Tobacco Use   Smoking status: Every Day    Packs/day: 1.00    Years: 20.00    Pack years: 20.00    Types: Cigarettes   Smokeless tobacco: Never  Substance Use Topics   Alcohol use: Yes    Alcohol/week: 1.0 standard drink    Types: 1 Cans of beer per week   Drug use: Never    Home Medications Prior to Admission medications   Medication Sig Start Date End Date Taking? Authorizing Provider  acetaminophen (TYLENOL) 500 MG tablet Take 500-1,000 mg by mouth every 8 (eight) hours as needed for mild pain (or headaches).     [provider]  albuterol (PROVENTIL HFA;VENTOLIN HFA) 108 (90 Base) MCG/ACT inhaler Inhale 2 puffs into the lungs every 6 (six) hours as needed for wheezing or shortness of breath. 09/21/17   Purohit, Salli Quarry, MD  aspirin EC 81 MG EC tablet Take 1 tablet (81 mg total) by mouth daily. Swallow whole. 05/14/20   Hughie Closs, MD  atorvastatin (LIPITOR) 80 MG tablet Take 80 mg by mouth daily. 05/18/20   [provider]  buPROPion (WELLBUTRIN SR) 150 MG 12 hr tablet Take 1 tablet by mouth in the morning and at bedtime. 05/18/20   [provider]  losartan (COZAAR) 50 MG tablet Take 50 mg by mouth daily. 05/02/20   [provider]  Multiple Vitamins-Minerals (CENTRUM SILVER 50+MEN) TABS Take 1 tablet by mouth daily with breakfast.    [provider]  Spacer/Aero Chamber Mouthpiece MISC 1 Units by Does not apply route every 4 (four) hours as needed. 09/21/17   Purohit, Salli Quarry, MD    Allergies    Patient has no known allergies.  Review of Systems   Review of Systems  Neurological:  Positive for weakness.  All other systems reviewed and are negative.  Physical Exam Updated Vital Signs BP (!) 143/86   Pulse 89   Temp 98.4 F (36.9 C) (Oral)   Resp (!) 22  SpO2 99%   Physical Exam Vitals and nursing note reviewed.  Constitutional:      Appearance: Normal appearance.  HENT:     Head: Normocephalic.     Nose: Nose normal.     Mouth/Throat:     Mouth: Mucous membranes are moist.  Eyes:     Pupils: Pupils are equal, round, and reactive to light.  Cardiovascular:     Rate and Rhythm: Normal rate and regular rhythm.     Pulses: Normal pulses.     Heart sounds: Normal heart sounds.  Pulmonary:     Effort: Pulmonary effort is normal.     Breath sounds: Normal breath sounds.  Abdominal:     General: Abdomen is flat.  Musculoskeletal:        General: Normal range of motion.     Cervical back: Normal range of motion and neck supple.  Skin:    General:  Skin is warm.     Capillary Refill: Capillary refill takes less than 2 seconds.  Neurological:     General: No focal deficit present.     Mental Status: He is alert and oriented to person, place, and time.     Comments: No facial droop.  Patient has normal sensation bilateral arms and legs.  Patient does have a pronator drift on the left side.  Strength is 4 out of 5 in the left arm.  Patient's strength is 3 out of 5 in the left leg.  On the right side is 5 out of 5.  Psychiatric:        Mood and Affect: Mood normal.        Behavior: Behavior normal.    ED Results / Procedures / Treatments   Labs (all labs ordered are listed, but only abnormal results are displayed) Labs Reviewed  COMPREHENSIVE METABOLIC PANEL - Abnormal; Notable for the following components:      Result Value   Glucose, Bld 120 (*)    Creatinine, Ser 1.30 (*)    AST 12 (*)    GFR, Estimated 56 (*)    All other components within normal limits  CBG MONITORING, ED - Abnormal; Notable for the following components:   Glucose-Capillary 117 (*)    All other components within normal limits  RESP PANEL BY RT-PCR (FLU A&B, COVID) ARPGX2  PROTIME-INR  APTT  CBC  DIFFERENTIAL  ETHANOL  RAPID URINE DRUG SCREEN, HOSP PERFORMED  URINALYSIS, ROUTINE W REFLEX MICROSCOPIC    EKG EKG Interpretation  Date/Time:  Sunday March 25 2021 18:48:16 EDT Ventricular Rate:  102 PR Interval:  201 QRS Duration: 98 QT Interval:  343 QTC Calculation: 447 R Axis:   252 Text Interpretation: Sinus tachycardia LAD, consider left anterior fascicular block Probable right ventricular hypertrophy ST elevation, consider inferior injury No significant change since last tracing Confirmed by Richardean Canal [phone]) on 03/25/2021 6:56:05 PM  Radiology No results found.  Procedures Procedures   Medications Ordered in ED Medications  iohexol (OMNIPAQUE) 350 MG/ML injection 100 mL (100 mLs Intravenous Contrast Given 03/25/21 1901)    ED  Course  I have reviewed the triage vital signs and the nursing notes.  Pertinent labs & imaging results that were available during my care of the patient were reviewed by me and considered in my medical decision making (see chart for details).    MDM Rules/Calculators/A&P  Adrian Rodgers is a 80 y.o. male here presenting with left leg weakness and left arm drift.  Patient had previous stroke in the past.  Patient is outside of the 4-hour window for tPA.  However he still within the window for IR tPA.  We will get stat CTA head and neck.  Code stroke activated.  7:44 PM Labs unremarkable. Dr. Otelia Limes (teleneurologist) saw patient.  By the time he examined the patient, patient's weakness and pronator drift has resolved.  He thought likely TIA.  He recommend admission to [facility] for MRI and stroke work-up. Stroke team will follow patient in the morning.  Final Clinical Impression(s) / ED Diagnoses Final diagnoses:  None    Rx / DC Orders ED Discharge Orders     None        Charlynne Pander, MD 03/25/21 1944

## 2021-03-25 NOTE — ED Triage Notes (Signed)
Pt reports he was in kitchen today around noon and his left leg felt weak. States he "couldn't get back up" after he sat down. Left leg weak at this time, left arm drift noted. Facial symmetry present. Pt alert

## 2021-03-25 NOTE — ED Triage Notes (Signed)
Pt eval in triage by Dr. Silverio Lay

## 2021-03-25 NOTE — Consult Note (Signed)
[facility] TeleNeurology Consult Services    Date of Service:  03/25/2021    Metrics: Last Known Well: 1200 Symptoms: As per HPI.  Patient is not a candidate for thrombolytic: Out of the time window.   Location of the provider: [facility]  Location of the patient: [facility] Pre-Morbid Modified Rankin Scale: 0  This consult was provided via telemedicine with 2-way video and audio communication. The patient/family was informed that care would be provided in this way and agreed to receive care in this manner.   ED Physician notified of diagnostic impression and management plan at: 7:38 PM.    Assessment: 80 year old male presenting with recurrence LLE weakness, similar to prior presentation last year with work up at that time revealing a right ACA infarction and right pericallosal artery severe stenosis  -Exam reveals no lower extremity weakness. NIHSS of 1 for one missed orientation question.  - CT head without acute abnormality. Severe chronic small vessel ischemic disease. Redemonstrated chronic lacunar infarct within the inferior right cerebellar hemisphere. Generalized cerebral and cerebellar atrophy. - CTA of neck: The bilateral common carotid and internal carotid arteries are patent within the neck without stenosis. Minimal soft plaque within the proximal right ICA. Tortuosity of both cervical internal carotid arteries. 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 Vertebral arteries patent within the neck without stenosis. Tortuosity of both vessels. - CTA head: No intracranial large vessel occlusion identified. 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 Atherosclerotic irregularity of the M2 and  more distal MCA vessels bilaterally. Most notably, there is a progressive moderate stenosis within a proximal M2 right MCA vessel. Atherosclerotic plaque within the intracranial internal carotid arteries bilaterally with no more than mild stenosis. Unchanged moderate/severe stenosis of the P1 right PCA.  Unchanged mild stenosis of the P1 left PCA.   Recommendations: - Medicine admission at [facility] for further assessment including MRI brain and TTE.  - Cardiac Telemetry - PT/OT/Speech - Permissive HTN x 24 hours - Has failed ASA monotherapy. Adding Plavix to his regimen at 75 mg po qd - Continue atorvastatin - Stroke Team to follow after arrival to [facility] - Also noted on CTA was a 9 mm region of ground-glass opacity in the left lung apex, stable as compared to the CTA head/neck of 05/13/2020. Follow-up chest CT is recommended every 2 years until 5 years of stability has been established. This recommendation follows the consensus statement: Guidelines for Management of Incidental Pulmonary Nodules Detected on CT Images:      ------------------------------------------------------------------------------   History of Present Illness: 80 year old male with a PMHx of HTN, hypercholesterolemia, right ACA stroke in 2021 with LLE weakness that resolved, who presents to the [facility] ED today with acute onset of LLE weakness. He states that the weakness is below his knee, sparing the upper part of his limb. Denies any associated sensory numbness, no LUE weakness, no facial droop, no vision changes, no dysarthria, no dysphasia and no confusion. CBG 117.  Home medications include ASA and atorvastatin.   MRI from 05/12/20:  Multiple small acute infarcts in the distal Right ACA territory, including medial motor strip and/or pre motor involvement in the lower extremity representation area.     Past Medical History: Past Medical History:  Diagnosis Date  High cholesterol    Hypertension       Past Surgical  History: Past Surgical History:  Procedure Laterality Date   implantable loop recorder placement  06/05/2020   Medtronic Reveal [place] model [id number] [phone]) implantable loop recorder      Medications:   No current facility-administered medications on file prior to encounter.   Current Outpatient Medications on File Prior to Encounter  Medication Sig Dispense Refill   acetaminophen (TYLENOL) 500 MG tablet Take 500-1,000 mg by mouth every 8 (eight) hours as needed for mild pain (or headaches).      albuterol (PROVENTIL HFA;VENTOLIN HFA) 108 (90 Base) MCG/ACT inhaler Inhale 2 puffs into the lungs every 6 (six) hours as needed for wheezing or shortness of breath. 1 Inhaler 2   aspirin EC 81 MG EC tablet Take 1 tablet (81 mg total) by mouth daily. Swallow whole. 30 tablet 11   atorvastatin (LIPITOR) 80 MG tablet Take 80 mg by mouth daily.     buPROPion (WELLBUTRIN SR) 150 MG 12 hr tablet Take 1 tablet by mouth in the morning and at bedtime.     losartan (COZAAR) 50 MG tablet Take 50 mg by mouth daily.     Multiple Vitamins-Minerals (CENTRUM SILVER 50+MEN) TABS Take 1 tablet by mouth daily with breakfast.     Spacer/Aero Chamber Mouthpiece MISC 1 Units by Does not apply route every 4 (four) hours as needed. 1 each 0       Social History: Smoker.  EtOH use.    Family History:  Reviewed in Epic   ROS: As per HPI    Anticoagulant use:  No   Antiplatelet use: ASA   Examination:    BP 123/89 (BP Location: Right Arm)   Pulse 89   Temp 98.4 F (36.9 C) (Oral)   Resp (!) 24   SpO2 93%     1A: Level of Consciousness - 0 1B: Ask Month and Age - 1 1C: Blink Eyes & Squeeze Hands - 0 2: Test Horizontal Extraocular Movements - 0 3: Test Visual Fields - 0 4: Test Facial Palsy (Use Grimace if Obtunded) - 0 5A: Test Left Arm Motor Drift - 0 5B: Test Right Arm Motor Drift - 0 6A: Test Left Leg Motor Drift - 0 6B: Test Right Leg Motor Drift - 0 7: Test Limb Ataxia (FNF/Heel-Shin) -  0 8: Test Sensation -  0 9: Test Language/Aphasia - 0 10: Test Dysarthria - Severe Dysarthria: 0 11: Test Extinction/Inattention - Extinction to bilateral simultaneous stimulation 0   NIHSS Score: 1     Patient/Family was informed the Neurology Consult would occur via TeleHealth consult by way of interactive audio and video telecommunications and consented to receiving care in this manner.   Patient is being evaluated for possible acute neurologic impairment and high pretest probability of imminent or life-threatening deterioration. I spent total of 40 minutes providing care to this patient, including time for face to face visit via telemedicine, review of medical records, imaging studies and discussion of findings with providers, the patient and/or family.   Electronically signed: Dr. Caryl Pina

## 2021-03-25 NOTE — ED Notes (Signed)
Pt in CT.

## 2021-03-26 ENCOUNTER — Encounter (HOSPITAL_COMMUNITY): Payer: Self-pay | Admitting: Internal Medicine

## 2021-03-26 DIAGNOSIS — R41841 Cognitive communication deficit: Secondary | ICD-10-CM | POA: Diagnosis not present

## 2021-03-26 DIAGNOSIS — Z20822 Contact with and (suspected) exposure to covid-19: Secondary | ICD-10-CM | POA: Diagnosis not present

## 2021-03-26 DIAGNOSIS — R531 Weakness: Secondary | ICD-10-CM | POA: Diagnosis present

## 2021-03-26 DIAGNOSIS — Y900 Blood alcohol level of less than 20 mg/100 ml: Secondary | ICD-10-CM | POA: Diagnosis not present

## 2021-03-26 DIAGNOSIS — Z7982 Long term (current) use of aspirin: Secondary | ICD-10-CM | POA: Diagnosis not present

## 2021-03-26 DIAGNOSIS — F17210 Nicotine dependence, cigarettes, uncomplicated: Secondary | ICD-10-CM | POA: Diagnosis not present

## 2021-03-26 DIAGNOSIS — I69354 Hemiplegia and hemiparesis following cerebral infarction affecting left non-dominant side: Secondary | ICD-10-CM | POA: Diagnosis not present

## 2021-03-26 DIAGNOSIS — G459 Transient cerebral ischemic attack, unspecified: Secondary | ICD-10-CM | POA: Diagnosis not present

## 2021-03-26 DIAGNOSIS — Z79899 Other long term (current) drug therapy: Secondary | ICD-10-CM | POA: Diagnosis not present

## 2021-03-26 DIAGNOSIS — I10 Essential (primary) hypertension: Secondary | ICD-10-CM | POA: Diagnosis not present

## 2021-03-26 DIAGNOSIS — J449 Chronic obstructive pulmonary disease, unspecified: Secondary | ICD-10-CM | POA: Diagnosis not present

## 2021-03-26 LAB — URINALYSIS, ROUTINE W REFLEX MICROSCOPIC
Bilirubin Urine: NEGATIVE
Glucose, UA: NEGATIVE mg/dL
Ketones, ur: NEGATIVE mg/dL
Nitrite: NEGATIVE
Protein, ur: NEGATIVE mg/dL
Specific Gravity, Urine: 1.01 (ref 1.005–1.030)
pH: 6 (ref 5.0–8.0)

## 2021-03-26 LAB — RAPID URINE DRUG SCREEN, HOSP PERFORMED
Amphetamines: NOT DETECTED
Barbiturates: NOT DETECTED
Benzodiazepines: NOT DETECTED
Cocaine: NOT DETECTED
Opiates: NOT DETECTED
Tetrahydrocannabinol: NOT DETECTED

## 2021-03-26 LAB — URINALYSIS, MICROSCOPIC (REFLEX)

## 2021-03-26 MED ORDER — ENOXAPARIN SODIUM 40 MG/0.4ML IJ SOSY
40.0000 mg | PREFILLED_SYRINGE | INTRAMUSCULAR | Status: DC
  Administered 2021-03-26: 40 mg via SUBCUTANEOUS
  Filled 2021-03-26: qty 0.4

## 2021-03-26 MED ORDER — CLOPIDOGREL BISULFATE 75 MG PO TABS
75.0000 mg | ORAL_TABLET | Freq: Every day | ORAL | Status: DC
  Administered 2021-03-26 – 2021-03-27 (×2): 75 mg via ORAL
  Filled 2021-03-26 (×2): qty 1

## 2021-03-26 MED ORDER — ACETAMINOPHEN 650 MG RE SUPP: 650.0000 mg | RECTAL | Status: DC | PRN

## 2021-03-26 MED ORDER — ATORVASTATIN CALCIUM 80 MG PO TABS
80.0000 mg | ORAL_TABLET | Freq: Every day | ORAL | Status: DC
  Administered 2021-03-26 – 2021-03-27 (×2): 80 mg via ORAL
  Filled 2021-03-26 (×2): qty 1

## 2021-03-26 MED ORDER — BUPROPION HCL ER (XL) 150 MG PO TB24
150.0000 mg | ORAL_TABLET | Freq: Every day | ORAL | Status: DC
  Administered 2021-03-26 – 2021-03-27 (×2): 150 mg via ORAL
  Filled 2021-03-26 (×2): qty 1

## 2021-03-26 MED ORDER — ASPIRIN EC 81 MG PO TBEC
81.0000 mg | DELAYED_RELEASE_TABLET | Freq: Every day | ORAL | Status: DC
  Administered 2021-03-26 – 2021-03-27 (×2): 81 mg via ORAL
  Filled 2021-03-26 (×2): qty 1

## 2021-03-26 MED ORDER — ACETAMINOPHEN 325 MG PO TABS: 650.0000 mg | ORAL_TABLET | ORAL | Status: DC | PRN

## 2021-03-26 MED ORDER — SENNOSIDES-DOCUSATE SODIUM 8.6-50 MG PO TABS: 1.0000 | ORAL_TABLET | Freq: Every evening | ORAL | Status: DC | PRN

## 2021-03-26 MED ORDER — ADULT MULTIVITAMIN W/MINERALS CH
1.0000 | ORAL_TABLET | Freq: Every day | ORAL | Status: DC
  Administered 2021-03-27: 1 via ORAL
  Filled 2021-03-26: qty 1

## 2021-03-26 MED ORDER — STROKE: EARLY STAGES OF RECOVERY BOOK
Freq: Once | Status: AC
  Filled 2021-03-26: qty 1

## 2021-03-26 MED ORDER — HYDRALAZINE HCL 25 MG PO TABS: 25.0000 mg | ORAL_TABLET | Freq: Four times a day (QID) | ORAL | Status: DC | PRN

## 2021-03-26 MED ORDER — ACETAMINOPHEN 160 MG/5ML PO SOLN: 650.0000 mg | ORAL | Status: DC | PRN

## 2021-03-26 MED ORDER — ALBUTEROL SULFATE (2.5 MG/3ML) 0.083% IN NEBU: 2.5000 mg | INHALATION_SOLUTION | Freq: Four times a day (QID) | RESPIRATORY_TRACT | Status: DC | PRN

## 2021-03-26 MED ORDER — ACETAMINOPHEN 500 MG PO TABS: 500.0000 mg | ORAL_TABLET | Freq: Three times a day (TID) | ORAL | Status: DC | PRN

## 2021-03-26 MED ORDER — NICOTINE 14 MG/24HR TD PT24
14.0000 mg | MEDICATED_PATCH | Freq: Every day | TRANSDERMAL | Status: DC
  Administered 2021-03-26 – 2021-03-27 (×2): 14 mg via TRANSDERMAL
  Filled 2021-03-26 (×2): qty 1

## 2021-03-26 NOTE — H&P (Addendum)
History and Physical    Adrian Rodgers AJG:[id number] DOB: 10/29/1940 DOA: 03/25/2021  PCP: Elsie Amis, MD (Confirm with patient/family/NH records and if not entered, this has to be entered at [facility] point of entry) Patient coming from: Home  I have personally briefly reviewed patient's old medical records in [facility] Link  Chief Complaint: Feeling better  HPI: Adrian Rodgers is a 80 y.o. 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  Patient lives by himself, he has a slight weakness of his left leg after stroke in 2021.  At baseline, able to ambulate short distance without walker, but usually will use walker for ambulation outside home.  Yesterday morning, patient sat down to eat his breakfast, shortly after, patient felt "left leg went sleep" he tried several to stand up but could not pick up the left leg at all.  He denied any left arm weakness however in the ED it was noted he had a left arm drift.  EMS arrived and found patient had left-sided weakness.  ER triggered code stroke, but on exam, patient weakness has largely resolved and muscle strength on left leg come back to baseline.  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  Considered to be a non-tPA candidate and transferred [facility] for further work-up. He has a loop recorder inserted after his stroke last year, no record of a-fib as per family.   Review of Systems: As per HPI otherwise 14 point review of systems negative.    Past Medical History:  Diagnosis Date   High cholesterol    Hypertension    Stroke [facility]) 05/01/2020    Past Surgical History:  Procedure Laterality Date   implantable loop recorder placement  06/05/2020   Medtronic Reveal [place] model [id number]) implantable loop recorder       reports that he has been smoking cigarettes. He has a 20.00 pack-year smoking history. He has never used smokeless tobacco. He reports current alcohol use of about 1.0 standard drink per week. He reports that he does not use drugs.  No Known Allergies  Family History  Family history unknown: Yes     Prior to Admission medications   Medication Sig Start Date End Date Taking? Authorizing Provider  aspirin EC 81 MG EC tablet Take 1 tablet (81 mg total) by mouth daily. Swallow whole. 05/14/20  Yes Pahwani, Daleen Bo, MD  atorvastatin (LIPITOR) 80 MG tablet Take 80 mg by mouth daily. 05/18/20  Yes [provider]  buPROPion (WELLBUTRIN XL) 150 MG 24 hr tablet Take 1 tablet by mouth daily. 01/15/21  Yes [provider]  losartan (COZAAR) 50 MG tablet Take 50 mg by mouth daily. 05/02/20  Yes [provider]  Multiple Vitamins-Minerals (CENTRUM SILVER 50+MEN) TABS Take 1 tablet by mouth daily with breakfast.   Yes [provider]  acetaminophen (TYLENOL) 500 MG tablet Take 500-1,000 mg by mouth every 8 (eight) hours as needed for mild pain (or headaches).     [provider]  albuterol (PROVENTIL HFA;VENTOLIN HFA) 108 (90 Base) MCG/ACT inhaler Inhale 2 puffs into the lungs every 6 (six) hours as needed for wheezing or shortness of breath. 09/21/17   Purohit, Salli Quarry, MD  Spacer/Aero Chamber Mouthpiece MISC 1 Units by Does not apply route every 4 (four) hours as  needed. 09/21/17   Delaine Lame, MD    Physical Exam: Vitals:   03/26/21 1530 03/26/21 1545 03/26/21 1600 03/26/21 1753  BP: 131/90 (!) 132/109 (!) 157/87 (!) 155/99  Pulse:  71 69 76  Resp: 20 (!) 22 (!) 21 20  Temp:    98.3 F (36.8 C)  TempSrc:    Oral  SpO2: 96% 94% 96% 95%  Weight:      Height:        Constitutional: NAD, calm, comfortable Vitals:   03/26/21 1530 03/26/21 1545 03/26/21 1600 03/26/21 1753  BP: 131/90 (!) 132/109 (!) 157/87 (!) 155/99  Pulse:  71 69 76  Resp: 20 (!) 22  (!) 21 20  Temp:    98.3 F (36.8 C)  TempSrc:    Oral  SpO2: 96% 94% 96% 95%  Weight:      Height:       Eyes: PERRL, lids and conjunctivae normal ENMT: Mucous membranes are moist. Posterior pharynx clear of any exudate or lesions.Normal dentition.  Neck: normal, supple, no masses, no thyromegaly Respiratory: clear to auscultation bilaterally, no wheezing, no crackles. Normal respiratory effort. No accessory muscle use.  Cardiovascular: Regular rate and rhythm, no murmurs / rubs / gallops. No extremity edema. 2+ pedal pulses. No carotid bruits.  Abdomen: no tenderness, no masses palpated. No hepatosplenomegaly. Bowel sounds positive.  Musculoskeletal: no clubbing / cyanosis. No joint deformity upper and lower extremities. Good ROM, no contractures. Normal muscle tone.  Skin: no rashes, lesions, ulcers. No induration Neurologic: CN 2-12 grossly intact. Sensation intact, DTR normal. Strength 4/5 in left foot on flexion compared to 5/5 on the right foot. Upper extremities 5/5 B/L. Psychiatric: Normal judgment and insight. Alert and oriented x 3. Normal mood.     Labs on Admission: I have personally reviewed following labs and imaging studies  CBC: Recent Labs  Lab 03/25/21 1903  WBC 7.1  NEUTROABS 3.8  HGB 14.6  HCT 43.7  MCV 93.6  PLT 383   Basic Metabolic Panel: Recent Labs  Lab 03/25/21 1903  NA 137  K 3.5  CL 104  CO2 25  GLUCOSE 120*  BUN 15  CREATININE 1.30*  CALCIUM 9.2   GFR: Estimated Creatinine Clearance: 50.6 mL/min (A) (by C-G formula based on SCr of 1.3 mg/dL (H)). Liver Function Tests: Recent Labs  Lab 03/25/21 1903  AST 12*  ALT 25  ALKPHOS 77  BILITOT 0.7  PROT 7.3  ALBUMIN 4.0   No results for input(s): LIPASE, AMYLASE in the last 168 hours. No results for input(s): AMMONIA in the last 168 hours. Coagulation Profile: Recent Labs  Lab 03/25/21 1903  INR 1.0   Cardiac Enzymes: No results for input(s): CKTOTAL, CKMB, CKMBINDEX,  TROPONINI in the last 168 hours. BNP (last 3 results) No results for input(s): PROBNP in the last 8760 hours. HbA1C: No results for input(s): HGBA1C in the last 72 hours. CBG: Recent Labs  Lab 03/25/21 1856  GLUCAP 117*   Lipid Profile: No results for input(s): CHOL, HDL, LDLCALC, TRIG, CHOLHDL, LDLDIRECT in the last 72 hours. Thyroid Function Tests: No results for input(s): TSH, T4TOTAL, FREET4, T3FREE, THYROIDAB in the last 72 hours. Anemia Panel: No results for input(s): VITAMINB12, FOLATE, FERRITIN, TIBC, IRON, RETICCTPCT in the last 72 hours. Urine analysis:    Component Value Date/Time   COLORURINE YELLOW 03/26/2021 0600   APPEARANCEUR CLEAR 03/26/2021 0600   LABSPEC 1.010 03/26/2021 0600   PHURINE 6.0 03/26/2021 0600   GLUCOSEU NEGATIVE  03/26/2021 0600   HGBUR MODERATE (A) 03/26/2021 0600   BILIRUBINUR NEGATIVE 03/26/2021 0600   KETONESUR NEGATIVE 03/26/2021 0600   PROTEINUR NEGATIVE 03/26/2021 0600   NITRITE NEGATIVE 03/26/2021 0600   LEUKOCYTESUR SMALL (A) 03/26/2021 0600    Radiological Exams on Admission: CT ANGIO HEAD NECK W WO CM (CODE STROKE)  Result Date: 03/25/2021 CLINICAL DATA:  Stroke/TIA, assess intracranial arteries. Stroke/TIA, assess extracranial arteries. Provided history: Trouble with left leg movement. Numbness on left. Stroke last year. EXAM: CT ANGIOGRAPHY HEAD AND NECK TECHNIQUE: Multidetector CT imaging of the head and neck was performed using the standard protocol during bolus administration of intravenous contrast. Multiplanar CT image reconstructions and MIPs were obtained to evaluate the vascular anatomy. Carotid stenosis measurements (when applicable) are obtained utilizing NASCET criteria, using the distal internal carotid diameter as the denominator. CONTRAST:  OMNIPAQUE IOHEXOL 350 MG/ML SOLN COMPARISON:  CT angiogram head/neck 05/13/2020. FINDINGS: CT HEAD FINDINGS Brain: Moderate generalized cerebral atrophy. Comparatively mild  cerebellar atrophy. Severe patchy and ill-defined hypoattenuation within the cerebral white matter, nonspecific but compatible with chronic small vessel ischemic disease. Redemonstrated chronic lacunar infarct within the inferior right cerebellar hemisphere. Basal ganglia mineralization bilaterally. There is no acute intracranial hemorrhage. No acute demarcated cortical infarct. No extra-axial fluid collection. No evidence of an intracranial mass. No midline shift. Vascular: No hyperdense vessel.  Atherosclerotic calcifications Skull: Normal. Negative for fracture or focal lesion. Sinuses: No significant paranasal sinus disease. Orbits: No orbital mass or acute orbital finding. Review of the MIP images confirms the above findings These results were called by telephone at the time of interpretation on 03/25/2021 at 7:19 pm to provider DAVID YAO , who verbally acknowledged these results. CTA NECK FINDINGS Aortic arch: The left vertebral artery arises directly from the aortic arch. Common origin of the innominate and left common carotid arteries. Atherosclerotic plaque within the visualized aortic arch and proximal major branch vessels of the neck. No hemodynamically significant innominate or proximal subclavian artery stenosis. Right carotid system: CCA and ICA patent within the neck without hemodynamically significant stenosis. Mild soft plaque within the proximal ICA. Tortuosity of the cervical ICA. Left carotid system: CCA and ICA patent within the neck without stenosis. Tortuosity of the cervical ICA. 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 Vertebral arteries: Patent within the neck without stenosis. Tortuosity of both vessels. The right vertebral artery is slightly dominant. Skeleton: Cervical spondylosis. No acute bony abnormality or aggressive osseous lesion. Other neck: No neck mass or cervical lymphadenopathy. Thyroid unremarkable. Upper chest: Centrilobular  emphysema. 9 mm region of ground-glass opacity in the left lung apex, stable as compared to the CTA head/neck of 05/13/2020. Repeat CT is recommended every 2 years until 5 years of stability has been established. This recommendation follows the consensus statement: Guidelines for Management of Incidental Pulmonary Nodules Detected on CT Images: From the Fleischner Society 2017; Radiology 2017; 284:228-243. Review of the MIP images confirms the above findings CTA HEAD FINDINGS Anterior circulation: The intracranial internal carotid arteries are patent. Calcified plaque within both vessels with no more than mild stenosis. The M1 middle cerebral arteries are patent. Atherosclerotic irregularity of the M2 and more distal middle cerebral artery vessels bilaterally. Most notably, there is a progressive moderate stenosis within a proximal M2 right MCA vessel (series 14, image 54). The anterior cerebral arteries are patent. 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 No intracranial aneurysm is identified. Posterior circulation: The intracranial vertebral arteries are patent. The basilar artery is patent. The posterior cerebral arteries are patent. Redemonstrated moderate/severe stenosis within the P1 right PCA. Mild stenosis of the P1 left PCA. Posterior communicating arteries are present bilaterally. Venous sinuses: Within the limitations of contrast timing, no convincing thrombus. Anatomic variants: As described. Review of the MIP images confirms the above findings No intracranial large vessel occlusion. 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 These findings were discussed with Dr. Otelia Limes by telephone at 7:25 p.m. on 03/25/2021. IMPRESSION: CT head: 1. No evidence of acute intracranial abnormality. 2.  Severe chronic small vessel ischemic disease. 3. Redemonstrated chronic lacunar infarct within the inferior right cerebellar hemisphere. 4. Generalized cerebral and cerebellar atrophy. CTA neck: 1. The bilateral common carotid and internal carotid arteries are patent within the neck without stenosis. Minimal soft plaque within the proximal right ICA. Tortuosity of both cervical internal carotid arteries. 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 2. Vertebral arteries patent within the neck without stenosis. Tortuosity of both vessels. 3. 9 mm region of ground-glass opacity in the left lung apex, stable as compared to the CTA head/neck of 05/13/2020. Follow-up chest CT is recommended every 2 years until 5 years of stability has been established. This recommendation follows the consensus statement: Guidelines for Management of Incidental Pulmonary Nodules Detected on CT Images: From the Fleischner Society 2017; Radiology 2017; 284:228-243. CTA head: 1. No intracranial large vessel occlusion identified. 2. 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 3. Atherosclerotic irregularity of the M2 and more distal MCA vessels bilaterally. Most notably, there is a progressive moderate stenosis within a proximal M2 right MCA vessel. 4. Atherosclerotic plaque within the intracranial internal carotid arteries bilaterally with no more than mild stenosis. 5. Unchanged moderate/severe stenosis of the P1 right PCA. 6. Unchanged mild stenosis of the P1 left PCA. Electronically Signed   By: Jackey Loge D.O.   On: 03/25/2021 19:48    EKG: Independently reviewed.  Sinus, chronic RBBB.  Assessment/Plan Active Problems:   TIA (transient ischemic attack)  (please populate well all  problems here in Problem List. (For example, if patient is on BP meds at home and you resume or decide to hold them, it is a problem that needs to be her. Same for CAD, COPD, HLD and so on)  TIA with right arm and leg paresis -Resolved. -D/W neurology Dr. Amada Jupiter, who will see the patient after MRI study. -Continue ASA, add Plavix as per neurology -Continue Statin -Permissive HTN for 48 hours. -Loop recorder, communicated with on call cardiology Dr. Jacques Navy, who will have  the loop recorded looked at in AM. Tele x 24 hours.   Left lung apex lesion -Stable size as compared to CT in 05/2020, outpatient follow-up with pulmonologist.  HTN -Home ACEI on hold, PRN Hydralazine for now.  HLD -On Statin.  Cigarette smoke -Educated to quit, nicotine patch for now.  COPD -No symptoms or signs of acute exacerbation, continue current as needed breathing meds.   DVT prophylaxis: Lovenox Code Status: Full code Family Communication: Daughter at  Disposition Plan: Expect less than 2 midnight hospital stay Consults called: Neuro and Cardio (for  loop recorder) Admission status: Tele obs   Emeline General MD [facility] Pager [phone]  03/26/2021, 7:12 PM

## 2021-03-26 NOTE — ED Notes (Addendum)
Report given to AJ RN at [facility].

## 2021-03-26 NOTE — Progress Notes (Signed)
Brief Neuro Update:    Briefly, [name] is a 80 y.o. male who presents with episode of LLE weakness while sitting in his couch at home. He felt this was very similar to his prior stroke episode a year ago. He does not think that he pinched a nerve in his leg, couch was really soft and comfortable. He felt numb and weak in L leg taht he describes as sudden onset. Takes aspirin daily at home, takes his cholesterol pill everyday. Continues to smoke and will think about quitting.  On my brief exam:  NIHSS components Score: Comment  1a Level of Conscious 0[x]  1[]  2[]  3[]      1b LOC Questions 0[]  1[x]  2[]     Reports he is 80 y/o  1c LOC Commands 0[x]  1[]  2[]       2 Best Gaze 0[x]  1[]  2[]       3 Visual 0[x]  1[]  2[]  3[]      4 Facial Palsy 0[x]  1[]  2[]  3[]      5a Motor Arm - left 0[x]  1[]  2[]  3[]  4[]  UN[]    5b Motor Arm - Right 0[x]  1[]  2[]  3[]  4[]  UN[]    6a Motor Leg - Left 0[x]  1[]  2[]  3[]  4[]  UN[]    6b Motor Leg - Right 0[x]  1[]  2[]  3[]  4[]  UN[]    7 Limb Ataxia 0[x]  1[]  2[]  3[]  UN[]     8 Sensory 0[x]  1[]  2[]  UN[]      9 Best Language 0[x]  1[]  2[]  3[]      10 Dysarthria 0[x]  1[]  2[]  UN[]      11 Extinct. and Inattention 0[x]  1[]  2[]       TOTAL: 1    Plan: - Please see full teleneuro consult note from Dr. Otelia Limes from yesterday. - Stroke team to evaluate tomorrow. - Stroke workup has been ordered. - counseled on the importance of quitting smoking to decrease risk of strokes in the future.  No charge note  Erick Blinks [facility] Pager Number [phone]

## 2021-03-27 ENCOUNTER — Encounter (HOSPITAL_BASED_OUTPATIENT_CLINIC_OR_DEPARTMENT_OTHER): Payer: Self-pay | Admitting: *Deleted

## 2021-03-27 ENCOUNTER — Observation Stay (HOSPITAL_COMMUNITY): Payer: Medicare Other

## 2021-03-27 ENCOUNTER — Other Ambulatory Visit: Payer: Self-pay

## 2021-03-27 ENCOUNTER — Emergency Department (HOSPITAL_BASED_OUTPATIENT_CLINIC_OR_DEPARTMENT_OTHER): Payer: Medicare Other

## 2021-03-27 ENCOUNTER — Observation Stay (HOSPITAL_BASED_OUTPATIENT_CLINIC_OR_DEPARTMENT_OTHER)
Admission: EM | Admit: 2021-03-27 | Discharge: 2021-03-30 | Disposition: A | Discharge status: Home / Self Care | Payer: Medicare Other | Source: Home / Self Care | Attending: Emergency Medicine | Admitting: Emergency Medicine

## 2021-03-27 ENCOUNTER — Observation Stay (HOSPITAL_BASED_OUTPATIENT_CLINIC_OR_DEPARTMENT_OTHER): Payer: Medicare Other

## 2021-03-27 DIAGNOSIS — Z79899 Other long term (current) drug therapy: Secondary | ICD-10-CM | POA: Insufficient documentation

## 2021-03-27 DIAGNOSIS — F17210 Nicotine dependence, cigarettes, uncomplicated: Secondary | ICD-10-CM | POA: Insufficient documentation

## 2021-03-27 DIAGNOSIS — R531 Weakness: Secondary | ICD-10-CM

## 2021-03-27 DIAGNOSIS — Z7902 Long term (current) use of antithrombotics/antiplatelets: Secondary | ICD-10-CM | POA: Insufficient documentation

## 2021-03-27 DIAGNOSIS — F39 Unspecified mood [affective] disorder: Secondary | ICD-10-CM | POA: Diagnosis present

## 2021-03-27 DIAGNOSIS — G459 Transient cerebral ischemic attack, unspecified: Secondary | ICD-10-CM

## 2021-03-27 DIAGNOSIS — Z8673 Personal history of transient ischemic attack (TIA), and cerebral infarction without residual deficits: Secondary | ICD-10-CM | POA: Diagnosis not present

## 2021-03-27 DIAGNOSIS — Z7982 Long term (current) use of aspirin: Secondary | ICD-10-CM | POA: Insufficient documentation

## 2021-03-27 DIAGNOSIS — I10 Essential (primary) hypertension: Secondary | ICD-10-CM | POA: Insufficient documentation

## 2021-03-27 DIAGNOSIS — Z20822 Contact with and (suspected) exposure to covid-19: Secondary | ICD-10-CM | POA: Diagnosis not present

## 2021-03-27 DIAGNOSIS — E785 Hyperlipidemia, unspecified: Secondary | ICD-10-CM | POA: Diagnosis present

## 2021-03-27 HISTORY — DX: Nicotine dependence, unspecified, uncomplicated: F17.200

## 2021-03-27 LAB — CBC
HCT: 43.9 % (ref 39.0–52.0)
Hemoglobin: 14.8 g/dL (ref 13.0–17.0)
MCH: 31.5 pg (ref 26.0–34.0)
MCHC: 33.7 g/dL (ref 30.0–36.0)
MCV: 93.4 fL (ref 80.0–100.0)
Platelets: 397 10*3/uL (ref 150–400)
RBC: 4.7 MIL/uL (ref 4.22–5.81)
RDW: 14 % (ref 11.5–15.5)
WBC: 5.8 10*3/uL (ref 4.0–10.5)
nRBC: 0 % (ref 0.0–0.2)

## 2021-03-27 LAB — ECHOCARDIOGRAM COMPLETE
Area-P 1/2: 2.66 cm2
Height: 72 in
S' Lateral: 2 cm
Weight: 3040 oz

## 2021-03-27 LAB — BASIC METABOLIC PANEL
Anion gap: 9 (ref 5–15)
BUN: 26 mg/dL — ABNORMAL HIGH (ref 8–23)
CO2: 25 mmol/L (ref 22–32)
Calcium: 9.3 mg/dL (ref 8.9–10.3)
Chloride: 103 mmol/L (ref 98–111)
Creatinine, Ser: 1.11 mg/dL (ref 0.61–1.24)
GFR, Estimated: >60 mL/min (ref >60)
Glucose, Bld: 104 mg/dL — ABNORMAL HIGH (ref 70–99)
Potassium: 3.7 mmol/L (ref 3.5–5.1)
Sodium: 137 mmol/L (ref 135–145)

## 2021-03-27 LAB — LIPID PANEL
Cholesterol: 135 mg/dL (ref 0–200)
HDL: 38 mg/dL — ABNORMAL LOW (ref >40)
LDL Cholesterol: 83 mg/dL (ref 0–99)
Total CHOL/HDL Ratio: 3.6 RATIO
Triglycerides: 71 mg/dL (ref <150)
VLDL: 14 mg/dL (ref 0–40)

## 2021-03-27 LAB — HEMOGLOBIN A1C
Hgb A1c MFr Bld: 6.2 % — ABNORMAL HIGH (ref 4.8–5.6)
Mean Plasma Glucose: 131.24 mg/dL

## 2021-03-27 MED ORDER — ASPIRIN 81 MG PO TBEC: 81.0000 mg | DELAYED_RELEASE_TABLET | Freq: Every day | ORAL | 0 refills | Status: AC

## 2021-03-27 MED ORDER — CLOPIDOGREL BISULFATE 75 MG PO TABS: 75.0000 mg | ORAL_TABLET | Freq: Every day | ORAL | 1 refills | Status: AC

## 2021-03-27 NOTE — Discharge Summary (Signed)
Physician Discharge Summary  Adrian Rodgers VXB:[id number] DOB: [birth year] DOA: 03/25/2021  PCP: Elsie Amis, MD  Admit date: 03/25/2021 Discharge date: 03/27/2021  Admitted From: home Disposition:  home  Recommendations for Outpatient Follow-up:  Follow up with PCP in 1-2 weeks Follow-up with neurology   Home Health:no  Equipment/Devices: none  Discharge Condition: Stable Code Status:   Code Status: Full Code Diet recommendation:  Diet Order             DIET DYS 3 Room service appropriate? Yes with Assist; Fluid consistency: Thin  Diet effective now                    Brief/Interim Summary: Per HPI-80 y.o. 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   Patient lives by himself, he has a slight weakness of his left leg after stroke in 2021.  At baseline, able to ambulate short distance without walker, but usually will use walker for ambulation outside home.  9/25 morning, patient sat down to eat his breakfast, shortly after, patient felt "left leg went sleep" he tried several to stand up but could not pick up the left leg at all.  He denied any left arm weakness however in the ED it was noted he had a left arm drift.  EMS arrived and found patient had left-sided weakness.  ER triggered code stroke, but on exam, patient weakness has largely resolved and muscle strength on left leg come back to baseline.  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  Considered to be a non-tPA candidate and transferred [facility] for further work-up. He has a loop recorder inserted after his stroke last year, no record of a-fib as per family. Patient admitted underwent stroke work-up HbA1c prediabetic range 6.2, fasting lipid profile with LDL 83.  Loop recorder was interrogated no  episode of A. fib per cardiology PA Dunn. MRI brain no acute stroke but moderate parenchymal volume loss, chronic white matter microangiopathy, small remote lateral infarct in the bilateral cerebellar hemisphere and remote infarct in the right ACA.  Echocardiogram with normal EF grade 1 DD no acute finding Seen by PT OT no further need.  Patient is deemed stable for discharge after neurology clearance   Discharge Diagnoses:  Recurrent LLE weakness similar to prior stroke from last year 2/2 TIA: MRI negative stroke, suspecting TIA.  Loop recorder no A. fib.  Completed stroke work-up with LDL 83 HbA1c.  Diabetes 6.2.  PT OT cleared.  Continue antiplatelets aspirin Plavix x3 weeks afterwards Plavix indefinitely as per neurology . he was off Plavix for a few months now and was only on aspirin PTA.  9 mm groundglass opacity in the left lung apex - stable compared to CTA head and neck from November/2021 follow-up CT chest every 2 years  Hypertension: Resume home meds on discharge HLD continue high intensity statin. Cigarette smoker tobacco cessation counseling COPD not in exacerbation continue home bronchodilators  Consults: Neurology  Subjective: Alert awake oriented, essentially no more weakness and has resolved Feels well no complaints. Discharge Exam: Vitals:   03/26/21 1753 03/26/21 2222  BP: (!) 155/99 (!) 146/77  Pulse: 76 69  Resp: 20 20  Temp: 98.3 F (36.8 C) 97.6 F (36.4 C)  SpO2: 95% 97%   General: Pt is  alert, awake, not in acute distress Cardiovascular: RRR, S1/S2 +, no rubs, no gallops Respiratory: CTA bilaterally, no wheezing, no rhonchi Abdominal: Soft, NT, ND, bowel sounds + Extremities: no edema, no cyanosis  Discharge Instructions  Discharge Instructions     Ambulatory referral to Neurology   Complete by: As directed    An appointment is requested in approximately: 4 weeks   Discharge instructions   Complete by: As directed    Please call call MD or return  to ER for similar or worsening recurring problem that brought you to hospital or if any fever,nausea/vomiting,abdominal pain, uncontrolled pain, chest pain,  shortness of breath or any other alarming symptoms.  Please continue aspirin and Plavix for 3 weeks afterwards continue Plavix alone discontinue aspirin  Please follow-up your doctor as instructed in a week time and call the office for appointment.  Please avoid alcohol, smoking, or any other illicit substance and maintain healthy habits including taking your regular medications as prescribed.  You were cared for by a hospitalist during your hospital stay. If you have any questions about your discharge medications or the care you received while you were in the hospital after you are discharged, you can call the unit and ask to speak with the hospitalist on call if the hospitalist that took care of you is not available.  Once you are discharged, your primary care physician will handle any further medical issues. Please note that NO REFILLS for any discharge medications will be authorized once you are discharged, as it is imperative that you return to your primary care physician (or establish a relationship with a primary care physician if you do not have one) for your aftercare needs so that they can reassess your need for medications and monitor your lab values   Increase activity slowly   Complete by: As directed       Allergies as of 03/27/2021   No Known Allergies      Medication List     TAKE these medications    acetaminophen 500 MG tablet Commonly known as: TYLENOL Take 500-1,000 mg by mouth every 8 (eight) hours as needed for mild pain (or headaches).   albuterol 108 (90 Base) MCG/ACT inhaler Commonly known as: VENTOLIN HFA Inhale 2 puffs into the lungs every 6 (six) hours as needed for wheezing or shortness of breath.   aspirin 81 MG EC tablet Take 1 tablet (81 mg total) by mouth daily for 21 days. Swallow whole.    atorvastatin 80 MG tablet Commonly known as: LIPITOR Take 80 mg by mouth daily.   buPROPion 150 MG 24 hr tablet Commonly known as: WELLBUTRIN XL Take 150 mg by mouth daily.   Centrum Silver 50+Men Tabs Take 1 tablet by mouth daily with breakfast.   clopidogrel 75 MG tablet Commonly known as: PLAVIX Take 1 tablet (75 mg total) by mouth daily. Start taking on: March 28, 2021   losartan 50 MG tablet Commonly known as: COZAAR Take 50 mg by mouth daily.   Spacer/Aero Chamber Mouthpiece Misc 1 Units by Does not apply route every 4 (four) hours as needed.        Follow-up Information     [facility] Follow up in 4 week(s).   Specialty: Neurology Why: follow-up in stroke clininc in 4 weeks. Contact information: [address] [place] [address] [phone]               No Known Allergies  The results of  significant diagnostics from this hospitalization (including imaging, microbiology, ancillary and laboratory) are listed below for reference.    Microbiology: Recent Results (from the past 240 hour(s))  Resp Panel by RT-PCR (Flu A&B, Covid) Nasopharyngeal Swab     Status: None   Collection Time: 03/25/21  7:25 PM   Specimen: Nasopharyngeal Swab; Nasopharyngeal(NP) swabs in vial transport medium  Result Value Ref Range Status   SARS Coronavirus 2 by RT PCR NEGATIVE NEGATIVE Final    Comment: (NOTE) SARS-CoV-2 target nucleic acids are NOT DETECTED.  The SARS-CoV-2 RNA is generally detectable in upper respiratory specimens during the acute phase of infection. The lowest concentration of SARS-CoV-2 viral copies this assay can detect is 138 copies/mL. A negative result does not preclude SARS-Cov-2 infection and should not be used as the sole basis for treatment or other patient management decisions. A negative result may occur with  improper specimen collection/handling, submission of specimen other than  nasopharyngeal swab, presence of viral mutation(s) within the areas targeted by this assay, and inadequate number of viral copies(<138 copies/mL). A negative result must be combined with clinical observations, patient history, and epidemiological information. The expected result is Negative.  Fact Sheet for Patients:  BloggerCourse.com  Fact Sheet for Healthcare Providers:  SeriousBroker.it  This test is no t yet approved or cleared by the [place] FDA and  has been authorized for detection and/or diagnosis of SARS-CoV-2 by FDA under an Emergency Use Authorization (EUA). This EUA will remain  in effect (meaning this test can be used) for the duration of the COVID-19 declaration under Section 564(b)(1) of the Act, 21 U.S.C.section 360bbb-3(b)(1), unless the authorization is terminated  or revoked sooner.       Influenza A by PCR NEGATIVE NEGATIVE Final   Influenza B by PCR NEGATIVE NEGATIVE Final    Comment: (NOTE) The Xpert Xpress SARS-CoV-2/FLU/RSV plus assay is intended as an aid in the diagnosis of influenza from Nasopharyngeal swab specimens and should not be used as a sole basis for treatment. Nasal washings and aspirates are unacceptable for Xpert Xpress SARS-CoV-2/FLU/RSV testing.  Fact Sheet for Patients: BloggerCourse.com  Fact Sheet for Healthcare Providers: SeriousBroker.it  This test is not yet approved or cleared by the [place] FDA and has been authorized for detection and/or diagnosis of SARS-CoV-2 by FDA under an Emergency Use Authorization (EUA). This EUA will remain in effect (meaning this test can be used) for the duration of the COVID-19 declaration under Section 564(b)(1) of the Act, 21 U.S.C. section 360bbb-3(b)(1), unless the authorization is terminated or revoked.  Performed at [facility], [address] [place], [place]  [address]     Procedures/Studies: MR BRAIN WO CONTRAST  Result Date: 03/27/2021 CLINICAL DATA:  TIA EXAM: MRI HEAD WITHOUT CONTRAST TECHNIQUE: Multiplanar, multiecho pulse sequences of the brain and surrounding structures were obtained without intravenous contrast. COMPARISON:  CT/CTA head neck 03/25/2021 FINDINGS: Brain: There is no evidence of acute intracranial hemorrhage, extra-axial fluid collection, or acute infarct. There are small remote lacunar infarcts in the bilateral cerebellar hemispheres. Additional remote infarcts are seen in the right aspect of the corpus callosum and parasagittal frontal lobe in the ACA territory (10-16, 10-22). There is moderate global parenchymal volume loss with commensurate enlargement of the ventricular system. There is extensive confluent FLAIR signal abnormality throughout the supratentorial white matter likely reflecting sequela of advanced chronic white matter microangiopathy. There is no mass lesion.  There is no midline shift. Vascular: Normal flow voids. Skull and  upper cervical spine: Normal marrow signal. Sinuses/Orbits: The paranasal sinuses are clear. The globes and orbits are unremarkable. Other: None. IMPRESSION: 1. No acute intracranial hemorrhage or infarct. 2. Moderate parenchymal volume loss and advanced chronic white matter microangiopathy. 3. Small remote lacunar infarcts in the bilateral cerebellar hemispheres and remote infarcts in the right ACA distribution. Electronically Signed   By: Lesia Hausen M.D.   On: 03/27/2021 09:02   ECHOCARDIOGRAM COMPLETE  Result Date: 03/27/2021    ECHOCARDIOGRAM REPORT   Patient Name:   Adrian Rodgers Date of Exam: 03/27/2021 Medical Rec #:  [id number]       Height:       72.0 in Accession #:    [id number]      Weight:       190.0 lb Date of Birth:  Jan 08, 1941       BSA:          2.085 m Patient Age:    80 years        BP:           140/87 mmHg Patient Gender: M               HR:           84 bpm. Exam Location:   Inpatient Procedure: 2D Echo, Color Doppler and Cardiac Doppler Indications:    Stroke i63.9  History:        Patient has prior history of Echocardiogram examinations, most                 recent 05/13/2020. Arrythmias:Atrial Fibrillation.  Sonographer:    Irving Burton Senior RDCS Referring Phys: [id number] Emeline General IMPRESSIONS  1. Left ventricular ejection fraction, by estimation, is 70 to 75%. The left ventricle has hyperdynamic function. The left ventricle has no regional wall motion abnormalities. Left ventricular diastolic parameters are consistent with Grade I diastolic dysfunction (impaired relaxation).  2. Right ventricular systolic function is normal. The right ventricular size is normal.  3. The mitral valve is normal in structure. No evidence of mitral valve regurgitation. No evidence of mitral stenosis.  4. The aortic valve is normal in structure. Aortic valve regurgitation is trivial. No aortic stenosis is present.  5. Aortic dilatation noted. There is mild dilatation of the ascending aorta, measuring 44 mm.  6. The inferior vena cava is normal in size with greater than 50% respiratory variability, suggesting right atrial pressure of 3 mmHg. Comparison(s): No significant change from prior study. Conclusion(s)/Recommendation(s): No intracardiac source of embolism detected on this transthoracic study. A transesophageal echocardiogram is recommended to exclude cardiac source of embolism if clinically indicated. FINDINGS  Left Ventricle: Left ventricular ejection fraction, by estimation, is 70 to 75%. The left ventricle has hyperdynamic function. The left ventricle has no regional wall motion abnormalities. The left ventricular internal cavity size was normal in size. There is no left ventricular hypertrophy. Left ventricular diastolic parameters are consistent with Grade I diastolic dysfunction (impaired relaxation). Right Ventricle: The right ventricular size is normal. No increase in right ventricular wall  thickness. Right ventricular systolic function is normal. Left Atrium: Left atrial size was normal in size. Right Atrium: Right atrial size was normal in size. Pericardium: There is no evidence of pericardial effusion. Mitral Valve: The mitral valve is normal in structure. No evidence of mitral valve regurgitation. No evidence of mitral valve stenosis. Tricuspid Valve: The tricuspid valve is normal in structure. Tricuspid valve regurgitation is not demonstrated. No evidence of tricuspid stenosis. Aortic Valve:  The aortic valve is normal in structure. Aortic valve regurgitation is trivial. No aortic stenosis is present. Pulmonic Valve: The pulmonic valve was normal in structure. Pulmonic valve regurgitation is not visualized. No evidence of pulmonic stenosis. Aorta: Aortic dilatation noted. There is mild dilatation of the ascending aorta, measuring 44 mm. Venous: The inferior vena cava is normal in size with greater than 50% respiratory variability, suggesting right atrial pressure of 3 mmHg. IAS/Shunts: No atrial level shunt detected by color flow Doppler.  LEFT VENTRICLE PLAX 2D LVIDd:         3.60 cm  Diastology LVIDs:         2.00 cm  LV e' medial:    4.35 cm/s LV PW:         1.10 cm  LV E/e' medial:  10.3 LV IVS:        1.00 cm  LV e' lateral:   5.00 cm/s LVOT diam:     1.90 cm  LV E/e' lateral: 9.0 LV SV:         37 LV SV Index:   18 LVOT Area:     2.84 cm  RIGHT VENTRICLE RV S prime:     10.40 cm/s TAPSE (M-mode): 2.0 cm LEFT ATRIUM             Index       RIGHT ATRIUM           Index LA diam:        2.50 cm 1.20 cm/m  RA Area:     15.20 cm LA Vol (A2C):   32.6 ml 15.64 ml/m RA Volume:   37.00 ml  17.75 ml/m LA Vol (A4C):   31.4 ml 15.06 ml/m LA Biplane Vol: 32.7 ml 15.69 ml/m  AORTIC VALVE LVOT Vmax:   66.60 cm/s LVOT Vmean:  50.200 cm/s LVOT VTI:    0.132 m  AORTA Ao Root diam: 4.20 cm Ao Asc diam:  4.40 cm MITRAL VALVE MV Area (PHT): 2.66 cm    SHUNTS MV Decel Time: 285 msec    Systemic VTI:  0.13 m  MV E velocity: 45.00 cm/s  Systemic Diam: 1.90 cm MV A velocity: 86.50 cm/s MV E/A ratio:  0.52 Donato Schultz MD Electronically signed by Donato Schultz MD Signature Date/Time: 03/27/2021/12:23:35 PM    Final    CT ANGIO HEAD NECK W WO CM (CODE STROKE)  Result Date: 03/25/2021 CLINICAL DATA:  Stroke/TIA, assess intracranial arteries. Stroke/TIA, assess extracranial arteries. Provided history: Trouble with left leg movement. Numbness on left. Stroke last year. EXAM: CT ANGIOGRAPHY HEAD AND NECK TECHNIQUE: Multidetector CT imaging of the head and neck was performed using the standard protocol during bolus administration of intravenous contrast. Multiplanar CT image reconstructions and MIPs were obtained to evaluate the vascular anatomy. Carotid stenosis measurements (when applicable) are obtained utilizing NASCET criteria, using the distal internal carotid diameter as the denominator. CONTRAST:  OMNIPAQUE IOHEXOL 350 MG/ML SOLN COMPARISON:  CT angiogram head/neck 05/13/2020. FINDINGS: CT HEAD FINDINGS Brain: Moderate generalized cerebral atrophy. Comparatively mild cerebellar atrophy. Severe patchy and ill-defined hypoattenuation within the cerebral white matter, nonspecific but compatible with chronic small vessel ischemic disease. Redemonstrated chronic lacunar infarct within the inferior right cerebellar hemisphere. Basal ganglia mineralization bilaterally. There is no acute intracranial hemorrhage. No acute demarcated cortical infarct. No extra-axial fluid collection. No evidence of an intracranial mass. No midline shift. Vascular: No hyperdense vessel.  Atherosclerotic calcifications Skull: Normal. Negative for fracture or focal lesion. Sinuses: No significant  paranasal sinus disease. Orbits: No orbital mass or acute orbital finding. Review of the MIP images confirms the above findings These results were called by telephone at the time of interpretation on 03/25/2021 at 7:19 pm to provider DAVID YAO , who  verbally acknowledged these results. CTA NECK FINDINGS Aortic arch: The left vertebral artery arises directly from the aortic arch. Common origin of the innominate and left common carotid arteries. Atherosclerotic plaque within the visualized aortic arch and proximal major branch vessels of the neck. No hemodynamically significant innominate or proximal subclavian artery stenosis. Right carotid system: CCA and ICA patent within the neck without hemodynamically significant stenosis. Mild soft plaque within the proximal ICA. Tortuosity of the cervical ICA. Left carotid system: CCA and ICA patent within the neck without stenosis. Tortuosity of the cervical ICA. 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 Vertebral arteries: Patent within the neck without stenosis. Tortuosity of both vessels. The right vertebral artery is slightly dominant. Skeleton: Cervical spondylosis. No acute bony abnormality or aggressive osseous lesion. Other neck: No neck mass or cervical lymphadenopathy. Thyroid unremarkable. Upper chest: Centrilobular emphysema. 9 mm region of ground-glass opacity in the left lung apex, stable as compared to the CTA head/neck of 05/13/2020. Repeat CT is recommended every 2 years until 5 years of stability has been established. This recommendation follows the consensus statement: Guidelines for Management of Incidental Pulmonary Nodules Detected on CT Images: From the Fleischner Society 2017; Radiology 2017; 284:228-243. Review of the MIP images confirms the above findings CTA HEAD FINDINGS Anterior circulation: The intracranial internal carotid arteries are patent. Calcified plaque within both vessels with no more than mild stenosis. The M1 middle cerebral arteries are patent. Atherosclerotic irregularity of the M2 and more distal middle cerebral artery vessels bilaterally. Most notably, there is a progressive moderate stenosis within a proximal M2 right MCA vessel  (series 14, image 54). The anterior cerebral arteries are patent. 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 No intracranial aneurysm is identified. Posterior circulation: The intracranial vertebral arteries are patent. The basilar artery is patent. The posterior cerebral arteries are patent. Redemonstrated moderate/severe stenosis within the P1 right PCA. Mild stenosis of the P1 left PCA. Posterior communicating arteries are present bilaterally. Venous sinuses: Within the limitations of contrast timing, no convincing thrombus. Anatomic variants: As described. Review of the MIP images confirms the above findings No intracranial large vessel occlusion. 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 These findings were discussed with Dr. Otelia Limes by telephone at 7:25 p.m. on 03/25/2021. IMPRESSION: CT head: 1. No evidence of acute intracranial abnormality. 2. Severe chronic small vessel ischemic disease. 3. Redemonstrated chronic lacunar infarct within the inferior right cerebellar hemisphere. 4. Generalized cerebral and cerebellar atrophy. CTA neck: 1. The bilateral common carotid and internal carotid arteries are patent within the neck without stenosis. Minimal soft plaque within the proximal right ICA. Tortuosity of both cervical internal carotid arteries. 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 2. Vertebral arteries patent within the neck without stenosis. Tortuosity of both vessels. 3. 9 mm region of ground-glass opacity in the left lung apex, stable as compared to the CTA head/neck of 05/13/2020. Follow-up chest CT is recommended every 2 years until 5  years of stability has been  established. This recommendation follows the consensus statement: Guidelines for Management of Incidental Pulmonary Nodules Detected on CT Images: From the Fleischner Society 2017; Radiology 2017; 284:228-243. CTA head: 1. No intracranial large vessel occlusion identified. 2. 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 3. Atherosclerotic irregularity of the M2 and more distal MCA vessels bilaterally. Most notably, there is a progressive moderate stenosis within a proximal M2 right MCA vessel. 4. Atherosclerotic plaque within the intracranial internal carotid arteries bilaterally with no more than mild stenosis. 5. Unchanged moderate/severe stenosis of the P1 right PCA. 6. Unchanged mild stenosis of the P1 left PCA. Electronically Signed   By: Jackey Loge D.O.   On: 03/25/2021 19:48    Labs: BNP (last 3 results) No results for input(s): BNP in the last 8760 hours. Basic Metabolic Panel: Recent Labs  Lab 03/25/21 1903  NA 137  K 3.5  CL 104  CO2 25  GLUCOSE 120*  BUN 15  CREATININE 1.30*  CALCIUM 9.2   Liver Function Tests: Recent Labs  Lab 03/25/21 1903  AST 12*  ALT 25  ALKPHOS 77  BILITOT 0.7  PROT 7.3  ALBUMIN 4.0   No results for input(s): LIPASE, AMYLASE in the last 168 hours. No results for input(s): AMMONIA in the last 168 hours. CBC: Recent Labs  Lab 03/25/21 1903  WBC 7.1  NEUTROABS 3.8  HGB 14.6  HCT 43.7  MCV 93.6  PLT 383   Cardiac Enzymes: No results for input(s): CKTOTAL, CKMB, CKMBINDEX, TROPONINI in the last 168 hours. BNP: Invalid input(s): POCBNP CBG: Recent Labs  Lab 03/25/21 1856  GLUCAP 117*   D-Dimer No results for input(s): DDIMER in the last 72 hours. Hgb A1c Recent Labs    03/27/21 0142  HGBA1C 6.2*   Lipid Profile Recent Labs    03/27/21 0142  CHOL 135  HDL 38*  LDLCALC 83  TRIG 71  CHOLHDL  3.6   Thyroid function studies No results for input(s): TSH, T4TOTAL, T3FREE, THYROIDAB in the last 72 hours.  Invalid input(s): FREET3 Anemia work up No results for input(s): VITAMINB12, FOLATE, FERRITIN, TIBC, IRON, RETICCTPCT in the last 72 hours. Urinalysis    Component Value Date/Time   COLORURINE YELLOW 03/26/2021 0600   APPEARANCEUR CLEAR 03/26/2021 0600   LABSPEC 1.010 03/26/2021 0600   PHURINE 6.0 03/26/2021 0600   GLUCOSEU NEGATIVE 03/26/2021 0600   HGBUR MODERATE (A) 03/26/2021 0600   BILIRUBINUR NEGATIVE 03/26/2021 0600   KETONESUR NEGATIVE 03/26/2021 0600   PROTEINUR NEGATIVE 03/26/2021 0600   NITRITE NEGATIVE 03/26/2021 0600   LEUKOCYTESUR SMALL (A) 03/26/2021 0600   Sepsis Labs Invalid input(s): PROCALCITONIN,  WBC,  LACTICIDVEN Microbiology Recent Results (from the past 240 hour(s))  Resp Panel by RT-PCR (Flu A&B, Covid) Nasopharyngeal Swab     Status: None   Collection Time: 03/25/21  7:25 PM   Specimen: Nasopharyngeal Swab; Nasopharyngeal(NP) swabs in vial transport medium  Result Value Ref Range Status   SARS Coronavirus 2 by RT PCR NEGATIVE NEGATIVE Final    Comment: (NOTE) SARS-CoV-2 target nucleic acids are NOT DETECTED.  The SARS-CoV-2 RNA is generally detectable in upper respiratory specimens during the acute phase of infection. The lowest concentration of SARS-CoV-2 viral copies this assay can detect is 138 copies/mL. A negative result does not preclude SARS-Cov-2 infection and should not be used as the sole  basis for treatment or other patient management decisions. A negative result may occur with  improper specimen collection/handling, submission of specimen other than nasopharyngeal swab, presence of viral mutation(s) within the areas targeted by this assay, and inadequate number of viral copies(<138 copies/mL). A negative result must be combined with clinical observations, patient history, and epidemiological information. The expected result  is Negative.  Fact Sheet for Patients:  BloggerCourse.com  Fact Sheet for Healthcare Providers:  SeriousBroker.it  This test is no t yet approved or cleared by the [place] FDA and  has been authorized for detection and/or diagnosis of SARS-CoV-2 by FDA under an Emergency Use Authorization (EUA). This EUA will remain  in effect (meaning this test can be used) for the duration of the COVID-19 declaration under Section 564(b)(1) of the Act, 21 U.S.C.section 360bbb-3(b)(1), unless the authorization is terminated  or revoked sooner.       Influenza A by PCR NEGATIVE NEGATIVE Final   Influenza B by PCR NEGATIVE NEGATIVE Final    Comment: (NOTE) The Xpert Xpress SARS-CoV-2/FLU/RSV plus assay is intended as an aid in the diagnosis of influenza from Nasopharyngeal swab specimens and should not be used as a sole basis for treatment. Nasal washings and aspirates are unacceptable for Xpert Xpress SARS-CoV-2/FLU/RSV testing.  Fact Sheet for Patients: BloggerCourse.com  Fact Sheet for Healthcare Providers: SeriousBroker.it  This test is not yet approved or cleared by the [place] FDA and has been authorized for detection and/or diagnosis of SARS-CoV-2 by FDA under an Emergency Use Authorization (EUA). This EUA will remain in effect (meaning this test can be used) for the duration of the COVID-19 declaration under Section 564(b)(1) of the Act, 21 U.S.C. section 360bbb-3(b)(1), unless the authorization is terminated or revoked.  Performed at [facility], [address] [place], [place] [address]      Time coordinating discharge: 25 minutes  SIGNED: Lanae Boast, MD  [facility] 03/27/2021, 1:41 PM  If 7PM-7AM, please contact night-coverage [email or web address]

## 2021-03-27 NOTE — Progress Notes (Addendum)
STROKE TEAM PROGRESS NOTE   INTERVAL HISTORY No family at bedside. Patient in chair eating. NAD. Able to feed self. Symptoms resolved.  Loop recorder interrogated by cardiology and it did not show any atrial fibrillation.  Vitals:   03/26/21 1545 03/26/21 1600 03/26/21 1753 03/26/21 2222  BP: (!) 132/109 (!) 157/87 (!) 155/99 (!) 146/77  Pulse: 71 69 76 69  Resp: (!) 22 (!) 21 20 20   Temp:   98.3 F (36.8 C) 97.6 F (36.4 C)  TempSrc:   Oral Oral  SpO2: 94% 96% 95% 97%  Weight:      Height:       CBC:  Recent Labs  Lab 03/25/21 1903  WBC 7.1  NEUTROABS 3.8  HGB 14.6  HCT 43.7  MCV 93.6  PLT 383   Basic Metabolic Panel:  Recent Labs  Lab 03/25/21 1903  NA 137  K 3.5  CL 104  CO2 25  GLUCOSE 120*  BUN 15  CREATININE 1.30*  CALCIUM 9.2   Lipid Panel:  Recent Labs  Lab 03/27/21 0142  CHOL 135  TRIG 71  HDL 38*  CHOLHDL 3.6  VLDL 14  LDLCALC 83   HgbA1c:  Recent Labs  Lab 03/27/21 0142  HGBA1C 6.2*   Urine Drug Screen:  Recent Labs  Lab 03/26/21 0600  LABOPIA NONE DETECTED  COCAINSCRNUR NONE DETECTED  LABBENZ NONE DETECTED  AMPHETMU NONE DETECTED  THCU NONE DETECTED  LABBARB NONE DETECTED    Alcohol Level  Recent Labs  Lab 03/25/21 1925  ETH <10    IMAGING past 24 hours MR BRAIN WO CONTRAST  Result Date: 03/27/2021 CLINICAL DATA:  TIA EXAM: MRI HEAD WITHOUT CONTRAST TECHNIQUE: Multiplanar, multiecho pulse sequences of the brain and surrounding structures were obtained without intravenous contrast. COMPARISON:  CT/CTA head neck 03/25/2021 FINDINGS: Brain: There is no evidence of acute intracranial hemorrhage, extra-axial fluid collection, or acute infarct. There are small remote lacunar infarcts in the bilateral cerebellar hemispheres. Additional remote infarcts are seen in the right aspect of the corpus callosum and parasagittal frontal lobe in the ACA territory (10-16, 10-22). There is moderate global parenchymal volume loss with  commensurate enlargement of the ventricular system. There is extensive confluent FLAIR signal abnormality throughout the supratentorial white matter likely reflecting sequela of advanced chronic white matter microangiopathy. There is no mass lesion.  There is no midline shift. Vascular: Normal flow voids. Skull and upper cervical spine: Normal marrow signal. Sinuses/Orbits: The paranasal sinuses are clear. The globes and orbits are unremarkable. Other: None. IMPRESSION: 1. No acute intracranial hemorrhage or infarct. 2. Moderate parenchymal volume loss and advanced chronic white matter microangiopathy. 3. Small remote lacunar infarcts in the bilateral cerebellar hemispheres and remote infarcts in the right ACA distribution. Electronically Signed   By: 03/27/2021 M.D.   On: 03/27/2021 09:02    PHYSICAL EXAM Pleasant elderly Caucasian male not in distress. . Afebrile. Head is nontraumatic. Neck is supple without bruit.    Cardiac exam no murmur or gallop. Lungs are clear to auscultation. Distal pulses are well felt.  Neurological Exam ;  Awake  Alert oriented x 3. Normal speech and language.eye movements full without nystagmus.fundi were not visualized. Vision acuity and fields appear normal. Hearing is normal. Palatal movements are normal. Face symmetric. Tongue midline. Normal strength, tone, reflexes and coordination. Normal sensation. Gait deferred.  ASSESSMENT/PLAN [name] is a 80 y.o. 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  CTA head : No LVO CTA  neck :1. The bilateral common carotid and internal carotid arteries are patent within the neck without stenosis. Minimal soft plaque within the proximal right ICA. Tortuosity of both cervical  internal carotid arteries. 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 2. Vertebral arteries patent within the neck without stenosis. Tortuosity of both vessels. 3. 9 mm region of ground-glass opacity in the left lung apex, stable as compared to the CTA head/neck of 05/13/2020 MRI  no acute infarct or hemorrhage. Small remote lacunar infarcts in bilateral cerebellar hemispheres. 2D Echo EF 70-75% LDL 83 HgbA1c 6.2 VTE prophylaxis - lovenox    Diet   DIET DYS 3 Room service appropriate? Yes with Assist; Fluid consistency: Thin   aspirin 81 mg daily prior to admission, now on aspirin 81 mg daily and clopidogrel 75 mg daily.  ASA and plavix for 3 weeks and then plavix alone. Therapy recommendations:  no follow up  Disposition:  home  Hypertension Home meds:  cozaar Stable Permissive hypertension (OK if < 220/120) but gradually normalize in 5-7 days Long-term BP goal normotensive  Hyperlipidemia Home meds:  atorvastatin 80 mg,  LDL 83, goal < 70 Continue statin at discharge Recommend further diet changes to continue to decrease cholesterol  Diabetes type II Controlled  (no diagnosis) Home meds:  none HgbA1c 6.2, goal < 7.0 CBGs Recent Labs    03/25/21 1856  GLUCAP 117*    Other Stroke Risk Factors Advanced Age >/= 36  Cigarette smoker advised to stop smoking Continue substance abuse cessation Hx stroke   Hospital day # 0  Adrian Lucks, MSN, NP-C [facility] [phone]  STROKE MD NOTE : I have personally obtained history,examined this patient, reviewed notes, independently viewed imaging studies, participated in medical decision making and plan of care.ROS completed by me personally and pertinent positives fully documented  I have made any additions or clarifications directly to the above note. Agree with note above.  Presented with  transient left leg weakness likely due to right hemispheric subcortical TIA from small vessel disease.  Continue ongoing stroke work-up.  Recommend aspirin Plavix for 3 weeks followed by Plavix alone and aggressive risk factor modification.  Patient will be discharged home and follow-up electively as an outpatient. D/W Dr Dayna Barker  .  Greater than 50% time during this 35-minute visit was spent on counseling and coordination of care about his TIA and stroke prevention and treatment Questions.  Delia Heady, MD Medical Director [facility] Pager: [phone] 03/27/2021 1:38 PM   To contact Stroke Continuity provider, please refer to [email or web address]. After hours, contact General Neurology

## 2021-03-27 NOTE — Evaluation (Signed)
Occupational Therapy Evaluation and Discharge Summary Patient Details Name: Adrian Rodgers MRN: [id number] DOB: 1941/04/18 Today's Date: 03/27/2021   History of Present Illness 80 y.o. male admitted 9/25 with sudden increase in LLE weakness. 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  Feel pt will be safe to d/c back home with family checking on him when medically ready.  No further OT recommended.      Recommendations for follow up therapy are one component of a multi-disciplinary discharge planning process, led by the attending physician.  Recommendations may be updated based on patient status, additional functional criteria and insurance authorization.   Follow Up Recommendations  No OT follow up;Supervision - Intermittent    Equipment Recommendations  None recommended by OT    Recommendations for Other Services       Precautions / Restrictions Precautions Precautions: None Restrictions Weight Bearing Restrictions: No      Mobility Bed Mobility Overal bed mobility: Independent             General bed mobility comments: No assist needed. Bed flat. Did not use bedrails.    Transfers Overall transfer level: Modified independent Equipment used: Rolling walker (2 wheeled);None (pt transferred with and without the walker.)             General transfer comment: No assist needed.    Balance Overall balance assessment: No apparent balance deficits (not formally assessed) Sitting-balance support: Feet supported Sitting balance-Leahy Scale: Normal     Standing balance support: During functional activity Standing balance-Leahy Scale: Good Standing balance comment: Pt felt slightly different and unsteady when walking up and down steps but did not require assist. Will have PT take a quick  look to ensure no exercises needed.                           ADL either performed or assessed with clinical judgement   ADL Overall ADL's : At baseline                                       General ADL Comments: Pt completed all basic adls without assist. Walked with pt in hallway with walker (per pt request).     Vision Baseline Vision/History: 1 Wears glasses Ability to See in Adequate Light: 0 Adequate Vision Assessment?: No apparent visual deficits     Perception Perception Perception Tested?: No   Praxis Praxis Praxis tested?: Within functional limits    Pertinent Vitals/Pain Pain Assessment: No/denies pain     Hand Dominance Right   Extremity/Trunk Assessment Upper Extremity Assessment Upper Extremity Assessment: Overall WFL for tasks assessed   Lower Extremity Assessment Lower Extremity Assessment: Defer to PT evaluation   Cervical / Trunk Assessment Cervical / Trunk Assessment: Normal   Communication Communication Communication: No difficulties   Cognition Arousal/Alertness: Awake/alert Behavior During Therapy: WFL for tasks assessed/performed Overall Cognitive Status: Within Functional Limits for tasks assessed                                     General Comments  Pt appears to be at baseline with adls.  Exercises     Shoulder Instructions      Home Living Family/patient expects to be discharged to:: Private residence Living Arrangements: Alone Available Help at Discharge: Family;Available 24 hours/day Type of Home: House Home Access: Stairs to enter     Home Layout: One level     Bathroom Shower/Tub: Chief Strategy Officer: Handicapped height     Home Equipment: Environmental consultant - 2 wheels;Shower seat   Additional Comments: Pt has high commode in bathroom.  Daughters live close and check on him often.      Prior Functioning/Environment Level of Independence: Independent         Comments: drives, dtr has groceries delivered        OT Problem List:        OT Treatment/Interventions:      OT Goals(Current goals can be found in the care plan section) Acute Rehab OT Goals Patient Stated Goal: to find out why this happened. OT Goal Formulation: All assessment and education complete, DC therapy  OT Frequency:     Barriers to D/C:            Co-evaluation              AM-PAC OT "6 Clicks" Daily Activity     Outcome Measure Help from another person eating meals?: None Help from another person taking care of personal grooming?: None Help from another person toileting, which includes using toliet, bedpan, or urinal?: None Help from another person bathing (including washing, rinsing, drying)?: None Help from another person to put on and taking off regular upper body clothing?: None Help from another person to put on and taking off regular lower body clothing?: None 6 Click Score: 24   End of Session Equipment Utilized During Treatment: Rolling walker Nurse Communication: Mobility status  Activity Tolerance: Patient tolerated treatment well Patient left: in chair;with call bell/phone within reach  OT Visit Diagnosis: Hemiplegia and hemiparesis Hemiplegia - Right/Left: Left Hemiplegia - dominant/non-dominant: Non-Dominant Hemiplegia - caused by:  (old CVA)                Time: 0709-0728 OT Time Calculation (min): 19 min Charges:  OT General Charges $OT Visit: 1 Visit OT Evaluation $OT Eval Moderate Complexity: 1 Mod  Hope Budds 03/27/2021, 8:19 AM

## 2021-03-27 NOTE — Progress Notes (Addendum)
[employer] rep aware of need to interrogate ILR. Report forthcoming. Addendum: per Shari Prows, rep, no arrhythmia episodes detected (no afib/flutter). Have relayed update to Dr. Jonathon Bellows via secure chat.

## 2021-03-27 NOTE — Progress Notes (Signed)
SLP Cancellation Note  Patient Details Name: Adrian Rodgers MRN: [id number] DOB: 08/31/1940   Cancelled treatment:       Reason Eval/Treat Not Completed: MRI of head without acute intracranial abnormalities. Per chart review pt back to baseline functioning. SLP screened, no needs identified, will sign off   Ardyth Gal. MA, CCC-SLP Acute Rehabilitation Services   03/27/2021, 2:45 PM

## 2021-03-27 NOTE — Progress Notes (Signed)
Pt. Discharged at 1504. Pt acknowledge the understanding to take aspirin and Plavix for 3 weeks then discontinue aspirin and continue Plavix.

## 2021-03-27 NOTE — ED Triage Notes (Signed)
Daughter states she notices left sided weakness when she went to pick up pt from hospital today for d/c , unknown time. Admit Sunday for DX TIA

## 2021-03-27 NOTE — Evaluation (Addendum)
Physical Therapy Evaluation/ Discharge Patient Details Name: Adrian Rodgers MRN: [id number] DOB: 1941/06/14 Today's Date: 03/27/2021  History of Present Illness  80 y.o. male admitted 9/25 with sudden increase in LLE weakness. 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 Pt [place] on a regular occurrence and is able to care for himself without falls at home. Pt with 5/5 bil LE strength and reports slight guarding with gait due to bunion. Pt at baseline functional level without further need for therapy at this time with pt aware and agreeable.        Recommendations for follow up therapy are one component of a multi-disciplinary discharge planning process, led by the attending physician.  Recommendations may be updated based on patient status, additional functional criteria and insurance authorization.  Follow Up Recommendations No PT follow up    Equipment Recommendations  None recommended by PT    Recommendations for Other Services       Precautions / Restrictions Precautions Precautions: None Restrictions Weight Bearing Restrictions: No      Mobility  Bed Mobility      General bed mobility comments: pt standing on arrival and to chair end of session    Transfers Overall transfer level: Modified independent              General transfer comment: No assist needed.  Ambulation/Gait Ambulation/Gait assistance: Modified independent (Device/Increase time) Gait Distance (Feet): 300 Feet Assistive device: Rolling walker (2 wheeled);None Gait Pattern/deviations: Step-through pattern;Decreased stride length   Gait velocity interpretation: >2.62 ft/sec, indicative of community ambulatory General Gait Details: pt initially walking with RW grossly 50' with  trunk flexed and self too posterior with cue for proper use. REmoved RW and pt continued gait without LOb with decrased stride. PT reports bunion on foot and slight antalgic gait to keep pressure off  Stairs Stairs: Yes Stairs assistance: Modified independent (Device/Increase time) Stair Management: One rail Right;Alternating pattern;Forwards Number of Stairs: 11 General stair comments: pt completed stairs with stability of rail without difficulty  Wheelchair Mobility    Modified Rankin (Stroke Patients Only) Modified Rankin (Stroke Patients Only) Pre-Morbid Rankin Score: No significant disability Modified Rankin: No significant disability     Balance Overall balance assessment: No apparent balance deficits (not formally assessed) Sitting-balance support: Feet supported Sitting balance-Leahy Scale: Normal     Standing balance support: During functional activity Standing balance-Leahy Scale: Good                              Pertinent Vitals/Pain Pain Assessment: No/denies pain    Home Living Family/patient expects to be discharged to:: Private residence Living Arrangements: Alone Available Help at Discharge: Family;Available 24 hours/day Type of Home: House Home Access: Stairs to enter     Home Layout: One level Home Equipment: Environmental consultant - 2 wheels;Shower seat Additional Comments: Pt has high commode in bathroom.  Daughters live close and check on him often.    Prior Function Level of Independence: Independent         Comments: drives, dtr has groceries delivered     Hand Dominance   Dominant Hand: Right    Extremity/Trunk Assessment   Upper Extremity Assessment Upper Extremity Assessment: Defer to OT evaluation  Lower Extremity Assessment Lower Extremity Assessment: Overall WFL for tasks assessed (pt with 5/5 bil LE strength)    Cervical / Trunk Assessment Cervical / Trunk Assessment: Normal  Communication   Communication: No  difficulties  Cognition Arousal/Alertness: Awake/alert Behavior During Therapy: WFL for tasks assessed/performed Overall Cognitive Status: Within Functional Limits for tasks assessed                                        General Comments General comments (skin integrity, edema, etc.): Pt appears to be at baseline with adls.    Exercises     Assessment/Plan    PT Assessment Patent does not need any further PT services  PT Problem List         PT Treatment Interventions      PT Goals (Current goals can be found in the Care Plan section)  Acute Rehab PT Goals Patient Stated Goal: to find out why this happened. PT Goal Formulation: All assessment and education complete, DC therapy    Frequency     Barriers to discharge        Co-evaluation               AM-PAC PT "6 Clicks" Mobility  Outcome Measure Help needed turning from your back to your side while in a flat bed without using bedrails?: None Help needed moving from lying on your back to sitting on the side of a flat bed without using bedrails?: None Help needed moving to and from a bed to a chair (including a wheelchair)?: None Help needed standing up from a chair using your arms (e.g., wheelchair or bedside chair)?: None Help needed to walk in hospital room?: None Help needed climbing 3-5 steps with a railing? : None 6 Click Score: 24    End of Session   Activity Tolerance: Patient tolerated treatment well Patient left: in chair;with call bell/phone within reach Nurse Communication: Mobility status PT Visit Diagnosis: Other abnormalities of gait and mobility (R26.89)    Time: 3559-7416 PT Time Calculation (min) (ACUTE ONLY): 10 min   Charges:   PT Evaluation $PT Eval Low Complexity: 1 Low          Mamoru Takeshita P, PT Acute Rehabilitation Services Pager: [phone] Office: [phone]   Enedina Finner Montanna Mcbain 03/27/2021, 8:54 AM

## 2021-03-27 NOTE — Plan of Care (Signed)
Discussed patient with Dr. Rubin Payor, patient was discharged only 3 hours prior after completing a TIA work-up for recurrent left sided weakness, presenting again with the same stereotyped symptoms.  Notably he has had a stroke (November 2021) in the right ACA territory felt to be secondary to right pericallosal artery severe stenosis.  Per Dr. Rubin Payor, daughter had noticed some left foot weakness even prior to discharge but was told that patient was at baseline and no further work-up was needed.  Subsequently he had severe weakness of the left arm and leg as well as some confusion, which resolved fully within a few minutes.  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  Low index of suspicion for an acute structural issue as MRI this morning at 8 AM was negative for acute intracranial abnormality, however it is reasonable to repeat head CT to confirm there is no bleed.  More importantly would like to obtain EEG, possibly long-term EEG monitoring for spell characterization as he is having near daily spells, although given the location of his prior stroke, focal seizure may be difficult to detect due to the relatively deep location.  I will hold off on antiseizure medication at this time given no shaking activity, to increase sensitivity of EEG.   Recommendation: -Return to [facility] for admission for further observation routine EEG at minimum, possible LTM at discretion of evaluating neurologist  -Head CT okay to repeat -Given full stroke workup completed in the last 2 days, no need to repeat this workup  Brooke Dare MD-PhD [facility] [phone] [facility] coverage for [employer] is from 8 AM to 4 AM in-house and 4 PM to 8 PM by telephone/video. 8 PM to 8 AM emergent questions or overnight urgent questions should be addressed to Teleneurology On-call or [facility] neurohospitalist; contact information can be found on  AMION

## 2021-03-27 NOTE — Progress Notes (Signed)
Echocardiogram 2D Echocardiogram has been performed.  Warren Lacy Jamiya Nims RDCS 03/27/2021, 9:59 AM

## 2021-03-27 NOTE — ED Notes (Signed)
Pt had to have significant help to get on stretcher, today he was able to get into car at [facility] when he was discharged with minimal assisstance and his daughter who picked him up had to stop at pharmacy and go in.  When she came back out he was speaking almost in a whisper and felt that he was weak on left side.  Pt had left arm drift on initial NIHSS and no effort against gravity in left leg.  Initially his speech was mildly slurred but this improved by the time I did the NIHSS.  After NIHSS pt suddenly lifts left leg and tells me that he can move it again.  Will continue to monitor.

## 2021-03-27 NOTE — Progress Notes (Signed)
Unable to get MRI at this time due to pt having loop recorder. Stated will be able to do on day shift. Also loop recorder device info card is needed.

## 2021-03-27 NOTE — ED Provider Notes (Signed)
[facility] EMERGENCY DEPARTMENT Provider Note   CSN: [id number] Arrival date & time: 03/27/21  1703     History Chief Complaint  Patient presents with   Extremity Weakness    Adrian Rodgers is a 80 y.o. male.   Extremity Weakness Pertinent negatives include no chest pain, no abdominal pain and no shortness of breath. Patient discharged from hospital around 2 hours prior to arrival.  Had been admitted for TIA/stroke.  Reportedly had no deficits while at the hospital but also there is report of residual deficits on left lower extremity from previous stroke.  Had had left-sided deficits during the previous episode.  Patient and his daughter state that he had had some weakness in his left foot that developed for that [place]cher from the hospital but he reportedly told staff and they said that he did not have any deficits.  Went with daughter to the pharmacy and then had increasing weakness to the left side.  Not really moving left leg at all and drift on the left side.  Also facial droop.  Came here.  Speech also somewhat slurred.     Past Medical History:  Diagnosis Date   High cholesterol    Hypertension    Stroke (HCC) 05/01/2020    Patient Active Problem List   Diagnosis Date Noted   TIA (transient ischemic attack) 03/25/2021   Essential hypertension 05/13/2020   HLD (hyperlipidemia) 05/13/2020   Acute CVA (cerebrovascular accident) (HCC) 05/12/2020   Community acquired pneumonia of right middle lobe of lung 09/20/2017   Sepsis (HCC) 09/19/2017    Past Surgical History:  Procedure Laterality Date   implantable loop recorder placement  06/05/2020   Medtronic Reveal [place] model [id number]) implantable loop recorder        Family History  Family history unknown: Yes    Social History   Tobacco Use   Smoking status: Every Day    Packs/day: 1.00    Years: 20.00    Pack years: 20.00    Types: Cigarettes   Smokeless tobacco: Never  Vaping Use    Vaping Use: Never used  Substance Use Topics   Alcohol use: Yes    Alcohol/week: 1.0 standard drink    Types: 1 Cans of beer per week   Drug use: Never    Home Medications Prior to Admission medications   Medication Sig Start Date End Date Taking? Authorizing Provider  acetaminophen (TYLENOL) 500 MG tablet Take 500-1,000 mg by mouth every 8 (eight) hours as needed for mild pain (or headaches).     [provider]  albuterol (PROVENTIL HFA;VENTOLIN HFA) 108 (90 Base) MCG/ACT inhaler Inhale 2 puffs into the lungs every 6 (six) hours as needed for wheezing or shortness of breath. 09/21/17   Purohit, Salli Quarry, MD  aspirin 81 MG EC tablet Take 1 tablet (81 mg total) by mouth daily for 21 days. Swallow whole. 03/27/21 04/17/21  Lanae Boast, MD  atorvastatin (LIPITOR) 80 MG tablet Take 80 mg by mouth daily. 05/18/20   [provider]  buPROPion (WELLBUTRIN XL) 150 MG 24 hr tablet Take 150 mg by mouth daily. 01/15/21   [provider]  clopidogrel (PLAVIX) 75 MG tablet Take 1 tablet (75 mg total) by mouth daily. 03/28/21 05/27/21  Lanae Boast, MD  losartan (COZAAR) 50 MG tablet Take 50 mg by mouth daily. 05/02/20   [provider]  Multiple Vitamins-Minerals (CENTRUM SILVER 50+MEN) TABS Take 1 tablet by mouth daily with breakfast.  [provider]  Spacer/Aero Chamber Mouthpiece MISC 1 Units by Does not apply route every 4 (four) hours as needed. 09/21/17   Purohit, Salli Quarry, MD    Allergies    Patient has no known allergies.  Review of Systems   Review of Systems  Constitutional:  Negative for appetite change.  Respiratory:  Negative for shortness of breath.   Cardiovascular:  Negative for chest pain.  Gastrointestinal:  Negative for abdominal pain.  Musculoskeletal:  Positive for extremity weakness. Negative for back pain.  Neurological:  Positive for facial asymmetry and weakness. Negative for numbness.  Psychiatric/Behavioral:  Negative for confusion.     Physical Exam Updated Vital Signs BP 140/84   Pulse 84   Temp 98.3 F (36.8 C) (Oral)   Resp 20   Ht 6' (1.829 m)   Wt 86.6 kg   SpO2 96%   BMI 25.90 kg/m   Physical Exam Vitals and nursing note reviewed.  HENT:     Head: Normocephalic.  Eyes:     Pupils: Pupils are equal, round, and reactive to light.  Cardiovascular:     Rate and Rhythm: Regular rhythm.  Pulmonary:     Effort: No respiratory distress.  Abdominal:     Tenderness: There is no abdominal tenderness.  Musculoskeletal:        General: No tenderness.     Cervical back: Neck supple.  Skin:    General: Skin is warm.  Neurological:     Mental Status: He is alert.     Comments: Left-sided facial droop.  Mild droop on left upper extremity.  Reportedly improved from triage.  Also flaccid on left lower extremity.  Awake and appropriate otherwise.    ED Results / Procedures / Treatments   Labs (all labs ordered are listed, but only abnormal results are displayed) Labs Reviewed  BASIC METABOLIC PANEL - Abnormal; Notable for the following components:      Result Value   Glucose, Bld 104 (*)    BUN 26 (*)    All other components within normal limits  CBC    EKG None  Radiology CT HEAD WO CONTRAST ( )  Result Date: 03/27/2021 CLINICAL DATA:  Left-sided weakness for 1 day EXAM: CT HEAD WITHOUT CONTRAST TECHNIQUE: Contiguous axial images were obtained from the base of the skull through the vertex without intravenous contrast. COMPARISON:  03/27/2021 at 9 a.m. FINDINGS: Brain: Confluent hypodensities within periventricular white matter are stable consistent with chronic small vessel ischemic change. No signs of acute infarct or hemorrhage. The lateral ventricles and midline structures are stable, with dense bilateral basal ganglia calcifications again noted. No acute extra-axial fluid collections. No mass effect. Vascular: No hyperdense vessel or unexpected calcification. Skull: Normal. Negative for fracture  or focal lesion. Sinuses/Orbits: No acute finding. Other: None. IMPRESSION: 1. No acute intracranial process. 2. Stable chronic small-vessel ischemic changes. No change since MRI performed earlier today. Electronically Signed   By: Sharlet Salina M.D.   On: 03/27/2021 19:05   MR BRAIN WO CONTRAST  Result Date: 03/27/2021 CLINICAL DATA:  TIA EXAM: MRI HEAD WITHOUT CONTRAST TECHNIQUE: Multiplanar, multiecho pulse sequences of the brain and surrounding structures were obtained without intravenous contrast. COMPARISON:  CT/CTA head neck 03/25/2021 FINDINGS: Brain: There is no evidence of acute intracranial hemorrhage, extra-axial fluid collection, or acute infarct. There are small remote lacunar infarcts in the bilateral cerebellar hemispheres. Additional remote infarcts are seen in the right aspect of the corpus callosum and parasagittal frontal lobe in  the ACA territory (10-16, 10-22). There is moderate global parenchymal volume loss with commensurate enlargement of the ventricular system. There is extensive confluent FLAIR signal abnormality throughout the supratentorial white matter likely reflecting sequela of advanced chronic white matter microangiopathy. There is no mass lesion.  There is no midline shift. Vascular: Normal flow voids. Skull and upper cervical spine: Normal marrow signal. Sinuses/Orbits: The paranasal sinuses are clear. The globes and orbits are unremarkable. Other: None. IMPRESSION: 1. No acute intracranial hemorrhage or infarct. 2. Moderate parenchymal volume loss and advanced chronic white matter microangiopathy. 3. Small remote lacunar infarcts in the bilateral cerebellar hemispheres and remote infarcts in the right ACA distribution. Electronically Signed   By: Lesia Hausen M.D.   On: 03/27/2021 09:02   ECHOCARDIOGRAM COMPLETE  Result Date: 03/27/2021    ECHOCARDIOGRAM REPORT   Patient Name:   DAICHI MORIS Date of Exam: 03/27/2021 Medical Rec #:  [id number]       Height:       72.0 in  Accession #:    [id number]      Weight:       190.0 lb Date of Birth:  [birth year]       BSA:          2.085 m Patient Age:    80 years        BP:           140/87 mmHg Patient Gender: M               HR:           84 bpm. Exam Location:  Inpatient Procedure: 2D Echo, Color Doppler and Cardiac Doppler Indications:    Stroke i63.9  History:        Patient has prior history of Echocardiogram examinations, most                 recent 05/13/2020. Arrythmias:Atrial Fibrillation.  Sonographer:    Irving Burton Senior RDCS Referring Phys: [id number] Emeline General IMPRESSIONS  1. Left ventricular ejection fraction, by estimation, is 70 to 75%. The left ventricle has hyperdynamic function. The left ventricle has no regional wall motion abnormalities. Left ventricular diastolic parameters are consistent with Grade I diastolic dysfunction (impaired relaxation).  2. Right ventricular systolic function is normal. The right ventricular size is normal.  3. The mitral valve is normal in structure. No evidence of mitral valve regurgitation. No evidence of mitral stenosis.  4. The aortic valve is normal in structure. Aortic valve regurgitation is trivial. No aortic stenosis is present.  5. Aortic dilatation noted. There is mild dilatation of the ascending aorta, measuring 44 mm.  6. The inferior vena cava is normal in size with greater than 50% respiratory variability, suggesting right atrial pressure of 3 mmHg. Comparison(s): No significant change from prior study. Conclusion(s)/Recommendation(s): No intracardiac source of embolism detected on this transthoracic study. A transesophageal echocardiogram is recommended to exclude cardiac source of embolism if clinically indicated. FINDINGS  Left Ventricle: Left ventricular ejection fraction, by estimation, is 70 to 75%. The left ventricle has hyperdynamic function. The left ventricle has no regional wall motion abnormalities. The left ventricular internal cavity size was normal in size. There is no  left ventricular hypertrophy. Left ventricular diastolic parameters are consistent with Grade I diastolic dysfunction (impaired relaxation). Right Ventricle: The right ventricular size is normal. No increase in right ventricular wall thickness. Right ventricular systolic function is normal. Left Atrium: Left atrial size was normal in size. Right Atrium: Right  atrial size was normal in size. Pericardium: There is no evidence of pericardial effusion. Mitral Valve: The mitral valve is normal in structure. No evidence of mitral valve regurgitation. No evidence of mitral valve stenosis. Tricuspid Valve: The tricuspid valve is normal in structure. Tricuspid valve regurgitation is not demonstrated. No evidence of tricuspid stenosis. Aortic Valve: The aortic valve is normal in structure. Aortic valve regurgitation is trivial. No aortic stenosis is present. Pulmonic Valve: The pulmonic valve was normal in structure. Pulmonic valve regurgitation is not visualized. No evidence of pulmonic stenosis. Aorta: Aortic dilatation noted. There is mild dilatation of the ascending aorta, measuring 44 mm. Venous: The inferior vena cava is normal in size with greater than 50% respiratory variability, suggesting right atrial pressure of 3 mmHg. IAS/Shunts: No atrial level shunt detected by color flow Doppler.  LEFT VENTRICLE PLAX 2D LVIDd:         3.60 cm  Diastology LVIDs:         2.00 cm  LV e' medial:    4.35 cm/s LV PW:         1.10 cm  LV E/e' medial:  10.3 LV IVS:        1.00 cm  LV e' lateral:   5.00 cm/s LVOT diam:     1.90 cm  LV E/e' lateral: 9.0 LV SV:         37 LV SV Index:   18 LVOT Area:     2.84 cm  RIGHT VENTRICLE RV S prime:     10.40 cm/s TAPSE (M-mode): 2.0 cm LEFT ATRIUM             Index       RIGHT ATRIUM           Index LA diam:        2.50 cm 1.20 cm/m  RA Area:     15.20 cm LA Vol (A2C):   32.6 ml 15.64 ml/m RA Volume:   37.00 ml  17.75 ml/m LA Vol (A4C):   31.4 ml 15.06 ml/m LA Biplane Vol: 32.7 ml 15.69  ml/m  AORTIC VALVE LVOT Vmax:   66.60 cm/s LVOT Vmean:  50.200 cm/s LVOT VTI:    0.132 m  AORTA Ao Root diam: 4.20 cm Ao Asc diam:  4.40 cm MITRAL VALVE MV Area (PHT): 2.66 cm    SHUNTS MV Decel Time: 285 msec    Systemic VTI:  0.13 m MV E velocity: 45.00 cm/s  Systemic Diam: 1.90 cm MV A velocity: 86.50 cm/s MV E/A ratio:  0.52 Donato Schultz MD Electronically signed by Donato Schultz MD Signature Date/Time: 03/27/2021/12:23:35 PM    Final     Procedures Procedures   Medications Ordered in ED Medications - No data to display  ED Course  I have reviewed the triage vital signs and the nursing notes.  Pertinent labs & imaging results that were available during my care of the patient were reviewed by me and considered in my medical decision making (see chart for details).    MDM Rules/Calculators/A&P                           Patient with recurrent neurodeficit.  Discharge 2 hours prior for same.  Similar deficits this time.  However while in the ER the deficits resolved back to baseline normal.  Discussed with Dr. Iver Nestle.  Since no neurodeficits does not need tPA.  Had extensive work-up recently but there is  question of a seizure.  Had MRI earlier today.  Head CT stable from this.  However will require admission to [facility] for more neurologic work-up with likely EEG.  Neuro hospitalist also notified.  Will admit to hospital Final Clinical Impression(s) / ED Diagnoses Final diagnoses:  TIA (transient ischemic attack)    Rx / DC Orders ED Discharge Orders     None        Benjiman Core, MD 03/27/21 2009

## 2021-03-28 ENCOUNTER — Encounter (HOSPITAL_COMMUNITY): Payer: Self-pay | Admitting: Internal Medicine

## 2021-03-28 DIAGNOSIS — R531 Weakness: Secondary | ICD-10-CM | POA: Diagnosis not present

## 2021-03-28 DIAGNOSIS — I10 Essential (primary) hypertension: Secondary | ICD-10-CM

## 2021-03-28 DIAGNOSIS — G459 Transient cerebral ischemic attack, unspecified: Secondary | ICD-10-CM | POA: Diagnosis not present

## 2021-03-28 DIAGNOSIS — F39 Unspecified mood [affective] disorder: Secondary | ICD-10-CM | POA: Diagnosis present

## 2021-03-28 MED ORDER — CLOPIDOGREL BISULFATE 75 MG PO TABS
75.0000 mg | ORAL_TABLET | Freq: Every day | ORAL | Status: DC
  Administered 2021-03-28 – 2021-03-30 (×3): 75 mg via ORAL
  Filled 2021-03-28 (×3): qty 1

## 2021-03-28 MED ORDER — STROKE: EARLY STAGES OF RECOVERY BOOK
Freq: Once | Status: AC
  Filled 2021-03-28: qty 1

## 2021-03-28 MED ORDER — ACETAMINOPHEN 160 MG/5ML PO SOLN: 650.0000 mg | ORAL | Status: DC | PRN

## 2021-03-28 MED ORDER — ACETAMINOPHEN 650 MG RE SUPP: 650.0000 mg | RECTAL | Status: DC | PRN

## 2021-03-28 MED ORDER — ENOXAPARIN SODIUM 40 MG/0.4ML IJ SOSY
40.0000 mg | PREFILLED_SYRINGE | INTRAMUSCULAR | Status: DC
  Administered 2021-03-28 – 2021-03-29 (×2): 40 mg via SUBCUTANEOUS
  Filled 2021-03-28 (×2): qty 0.4

## 2021-03-28 MED ORDER — ATORVASTATIN CALCIUM 80 MG PO TABS
80.0000 mg | ORAL_TABLET | Freq: Every day | ORAL | Status: DC
  Administered 2021-03-28 – 2021-03-30 (×3): 80 mg via ORAL
  Filled 2021-03-28 (×3): qty 1

## 2021-03-28 MED ORDER — ALBUTEROL SULFATE (2.5 MG/3ML) 0.083% IN NEBU: 2.5000 mg | INHALATION_SOLUTION | Freq: Four times a day (QID) | RESPIRATORY_TRACT | Status: DC | PRN

## 2021-03-28 MED ORDER — NICOTINE 14 MG/24HR TD PT24
14.0000 mg | MEDICATED_PATCH | Freq: Once | TRANSDERMAL | Status: AC
  Administered 2021-03-28: 14 mg via TRANSDERMAL
  Filled 2021-03-28 (×2): qty 1

## 2021-03-28 MED ORDER — ACETAMINOPHEN 325 MG PO TABS: 650.0000 mg | ORAL_TABLET | ORAL | Status: DC | PRN

## 2021-03-28 MED ORDER — ALBUTEROL SULFATE HFA 108 (90 BASE) MCG/ACT IN AERS: 2.0000 | INHALATION_SPRAY | Freq: Four times a day (QID) | RESPIRATORY_TRACT | Status: DC | PRN

## 2021-03-28 MED ORDER — SENNOSIDES-DOCUSATE SODIUM 8.6-50 MG PO TABS: 1.0000 | ORAL_TABLET | Freq: Every evening | ORAL | Status: DC | PRN

## 2021-03-28 MED ORDER — ASPIRIN EC 81 MG PO TBEC
81.0000 mg | DELAYED_RELEASE_TABLET | Freq: Every day | ORAL | Status: DC
  Administered 2021-03-28 – 2021-03-30 (×3): 81 mg via ORAL
  Filled 2021-03-28 (×3): qty 1

## 2021-03-28 MED ORDER — BUPROPION HCL ER (XL) 150 MG PO TB24
150.0000 mg | ORAL_TABLET | Freq: Every day | ORAL | Status: DC
  Administered 2021-03-28 – 2021-03-30 (×3): 150 mg via ORAL
  Filled 2021-03-28 (×3): qty 1

## 2021-03-28 MED ORDER — ONDANSETRON HCL 4 MG/2ML IJ SOLN: 4.0000 mg | Freq: Four times a day (QID) | INTRAMUSCULAR | Status: DC | PRN

## 2021-03-28 MED ORDER — LOSARTAN POTASSIUM 50 MG PO TABS
50.0000 mg | ORAL_TABLET | Freq: Every day | ORAL | Status: DC
  Administered 2021-03-28: 50 mg via ORAL
  Filled 2021-03-28: qty 2

## 2021-03-28 NOTE — ED Notes (Signed)
Pt resting comfortably

## 2021-03-28 NOTE — ED Notes (Signed)
Pt in room on personal cell phone. No complaints at this time.

## 2021-03-28 NOTE — Consult Note (Signed)
NEUROLOGY CONSULTATION NOTE   Date of service: March 28, 2021 Patient Name: Adrian Rodgers MRN:  [id number] DOB:  05-14-1941 Reason for consult: "L sided weakness" Requesting Provider: Jonah Blue, MD _ _ _   _ __   _ __ _ _  __ __   _ __   __ _  History of Present Illness  Adrian Rodgers is a 80 y.o. male with PMH significant for Hld, prior R frontal stroke, smoker, who presents with L sided weakness. Was seen by [place] a couple days ago for similar symptoms and thought to be a TIA and discharged. Reports his leg felt weak around the time of discharge from the hospital. Daughter got him to the car and as he sat in the car on his way home, noted by daughter to have a speech change in the car. She wanted to test him so asked him to pick up a bag on the floor in the car and he could not bend over to get it. Seemed dazed. She drove to the wrong drug store and noted that Adrian Rodgers could not get out of the car. She drove him straight to the ED again. Shorlty after arrival to the ED, his weakness resolved and he was able to pick his left leg up. Case discussed with neurology team and concern for potential partial seizure and he was sent to [facility] for a cEEG.  [name] denies any prior hx of strokes. No hx of head injury with LOC. He does not drink alcohol. No hx of CNS surgery, no prior CNS infection. No family hx of seizures.   Constitutional Denies weight loss, fever and chills.   HEENT Denies changes in vision and hearing.   Respiratory Denies SOB and cough.   CV Denies palpitations and CP   GI Denies abdominal pain, nausea, vomiting and diarrhea.   GU Denies dysuria and urinary frequency.   MSK Denies myalgia and joint pain.   Skin Denies rash and pruritus.   Neurological Denies headache and syncope.   Psychiatric Denies recent changes in mood. Denies anxiety and depression.    Past History   Past Medical History:  Diagnosis Date   High cholesterol    Hypertension    Stroke (HCC)  05/01/2020   Tobacco dependence    Past Surgical History:  Procedure Laterality Date   implantable loop recorder placement  06/05/2020   Medtronic Reveal [place] model [id number]) implantable loop recorder    Family History  Family history unknown: Yes   Social History   Socioeconomic History   Marital status: Divorced    Spouse name: Not on file   Number of children: Not on file   Years of education: Not on file   Highest education level: Not on file  Occupational History   Occupation: retired  Tobacco Use   Smoking status: Every Day    Packs/day: 1.00    Years: 20.00    Pack years: 20.00    Types: Cigarettes   Smokeless tobacco: Never  Vaping Use   Vaping Use: Never used  Substance and Sexual Activity   Alcohol use: Yes    Alcohol/week: 1.0 standard drink    Types: 1 Cans of beer per week    Comment: remote h/o heavy use   Drug use: Never   Sexual activity: Not on file  Other Topics Concern   Not on file  Social History Narrative   Not on file   Social Determinants of Health   Financial  Resource Strain: Not on file  Food Insecurity: Not on file  Transportation Needs: Not on file  Physical Activity: Not on file  Stress: Not on file  Social Connections: Not on file   No Known Allergies  Medications   Medications Prior to Admission  Medication Sig Dispense Refill Last Dose   acetaminophen (TYLENOL) 500 MG tablet Take 500-1,000 mg by mouth every 8 (eight) hours as needed for mild pain (or headaches).       albuterol (PROVENTIL HFA;VENTOLIN HFA) 108 (90 Base) MCG/ACT inhaler Inhale 2 puffs into the lungs every 6 (six) hours as needed for wheezing or shortness of breath. 1 Inhaler 2    aspirin 81 MG EC tablet Take 1 tablet (81 mg total) by mouth daily for 21 days. Swallow whole. 21 tablet 0    atorvastatin (LIPITOR) 80 MG tablet Take 80 mg by mouth daily.      buPROPion (WELLBUTRIN XL) 150 MG 24 hr tablet Take 150 mg by mouth daily.      clopidogrel  (PLAVIX) 75 MG tablet Take 1 tablet (75 mg total) by mouth daily. 30 tablet 1    losartan (COZAAR) 50 MG tablet Take 50 mg by mouth daily.      Multiple Vitamins-Minerals (CENTRUM SILVER 50+MEN) TABS Take 1 tablet by mouth daily with breakfast.      Spacer/Aero Chamber Mouthpiece MISC 1 Units by Does not apply route every 4 (four) hours as needed. 1 each 0      Vitals   Vitals:   03/28/21 1629 03/28/21 1730 03/28/21 1932 03/28/21 2129  BP: (!) 140/91 (!) 137/93 139/83 122/80  Pulse: 73 83 82 84  Resp: 18 20 20 18   Temp: 98.6 F (37 C) 98.6 F (37 C) 98.5 F (36.9 C) 98.5 F (36.9 C)  TempSrc: Oral Oral Oral Oral  SpO2: 92% 94% 98% 97%  Weight:      Height:         Body mass index is 25.9 kg/m.  Physical Exam   General: Laying comfortably in bed; in no acute distress.  HENT: Normal oropharynx and mucosa. Normal external appearance of ears and nose.  Neck: Supple, no pain or tenderness  CV: No JVD. No peripheral edema.  Pulmonary: Symmetric Chest rise. Normal respiratory effort.  Abdomen: Soft to touch, non-tender.  Ext: No cyanosis, edema, or deformity  Skin: No rash. Normal palpation of skin.   Musculoskeletal: Normal digits and nails by inspection. No clubbing.   Neurologic Examination  Mental status/Cognition: Alert, oriented to self, place, month and year, good attention.  Speech/language: Fluent, comprehension intact, object naming intact, repetition intact.  Cranial nerves:   CN II Pupils equal and reactive to light, no VF deficits    CN III,IV,VI EOM intact, no gaze preference or deviation, no nystagmus    CN V normal sensation in V1, V2, and V3 segments bilaterally    CN VII no asymmetry, no nasolabial fold flattening    CN VIII normal hearing to speech    CN IX & X normal palatal elevation, no uvular deviation    CN XI 5/5 head turn and 5/5 shoulder shrug bilaterally    CN XII midline tongue protrusion    Motor:  Muscle bulk: normal, tone normal, pronator  drift none tremor none Mvmt Root Nerve  Muscle Right Left Comments  SA C5/6 Ax Deltoid 5 5   EF C5/6 Mc Biceps 5 5   EE C6/7/8 Rad Triceps 5 5   WF C6/7  Med FCR     WE C7/8 PIN ECU     F Ab C8/T1 U ADM/FDI 5 5   HF L1/2/3 Fem Illopsoas 5 5   KE L2/3/4 Fem Quad 5 5   DF L4/5 D Peron Tib Ant 5 5   PF S1/2 Tibial Grc/Sol 5 5    Reflexes:  Right Left Comments  Pectoralis      Biceps (C5/6) 2 2   Brachioradialis (C5/6) 2 2    Triceps (C6/7) 2 2    Patellar (L3/4) 2 2    Achilles (S1)      Hoffman      Plantar     Jaw jerk    Sensation:  Light touch intact   Pin prick    Temperature    Vibration   Proprioception    Coordination/Complex Motor:  - Finger to Nose intact - Heel to shin intact BL - Rapid alternating movement are normal - Gait: Stride length short. Arm swing none. Base width wide  Labs   CBC:  Recent Labs  Lab 03/25/21 1903 03/27/21 1743  WBC 7.1 5.8  NEUTROABS 3.8  --   HGB 14.6 14.8  HCT 43.7 43.9  MCV 93.6 93.4  PLT 383 397    Basic Metabolic Panel:  Lab Results  Component Value Date   NA 137 03/27/2021   K 3.7 03/27/2021   CO2 25 03/27/2021   GLUCOSE 104 (H) 03/27/2021   BUN 26 (H) 03/27/2021   CREATININE 1.11 03/27/2021   CALCIUM 9.3 03/27/2021   GFRNONAA >60 03/27/2021   GFRAA >60 09/19/2017   Lipid Panel:  Lab Results  Component Value Date   LDLCALC 83 03/27/2021   HgbA1c:  Lab Results  Component Value Date   HGBA1C 6.2 (H) 03/27/2021   Urine Drug Screen:     Component Value Date/Time   LABOPIA NONE DETECTED 03/26/2021 0600   COCAINSCRNUR NONE DETECTED 03/26/2021 0600   LABBENZ NONE DETECTED 03/26/2021 0600   AMPHETMU NONE DETECTED 03/26/2021 0600   THCU NONE DETECTED 03/26/2021 0600   LABBARB NONE DETECTED 03/26/2021 0600    Alcohol Level     Component Value Date/Time   ETH <10 03/25/2021 1925    CT Head without contrast: Personally reviewed and CTH was negative for a large hypodensity concerning for a large  territory infarct or hyperdensity concerning for an ICH.  MRI Brain: Personally reviewed and no acute stroke, no ICH. Lacunar strokes in BL cerebellar hemispheres and remote R ACA infarcts.  Impression   Jarett Dralle is a 80 y.o. male with PMH significant for HLD, prior R frontal stroke, smoker, presenting with recurrent episodes of left leg weakness and numbness that self resolve. Given that he had 2 episodes about 24 hours apart, I do think it is reasonable to get him on cEEG to see if he has any seizure or epileptiform discharges. He does have a prior R ACA stroke which could be focus for any seizures. It would be very unusual for stroke to present with recurrent episodes of LLE weakness.  Impression: - Focal seizure  Recommendations  - cEEG - Seizure precautions - Decision to start AEDs to be based on cEEG. __________________________________________________________________   Thank you for the opportunity to take part in the care of this patient. If you have any further questions, please contact the neurology consultation attending.  Signed,  Erick Blinks [facility] Pager Number [phone] _ _ _   _ __   _ __ _ _  __ __   _ __   __ _  

## 2021-03-28 NOTE — ED Notes (Signed)
Pt ambulated to the bathroom with little assistance. Upon finishing in bathroom pt was provided a wheel chair back to room.

## 2021-03-28 NOTE — ED Notes (Signed)
Pt provided cereal for breakfast

## 2021-03-28 NOTE — H&P (Signed)
History and Physical    Jahvon Gosline XFG:[id number] DOB: 09-27-1940 DOA: 03/27/2021  PCP: Elsie Amis, MD Consultants:  [facility] - CT surgery; [facility] - cardiology Patient coming from:  Home - lives alone; [place]: Daughter, [phone]  Chief Complaint: L-sided weakness  HPI: Raylin Winer is a 80 y.o. 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  He was also hospitalized for this from 9/25-27 and it was thought to be due to TIA.  He was unable to pick up his foot.  Even before he left, he was going to put on his clothes and he had persistent weakness.  His daughter brought this to the LPN's attention and her daughter felt like it was ignored.  His speech changed in the car, speaking with a soft tone.  He didn't recognize this.  He just looked different.  She dropped something and asked him to pick it up, but he was unable to place it in an envelope.  They went to the drugstore for nicotine patches; she was gone maybe 10 minutes and she found him slumped.  He was unable to feel his foot or lift his foot.  After his last stroke he recovered completely with therapy.  He was completely independent, mowing grass.  He could move his thigh region but nothing from the knee down. No arm symptoms per patient report, but his daughter reports that it was mildly weak and he was leaning to the left.  No loss of sensation.  He thought his speech was slurred, but his daughter couldn't tell because it was so soft.  He did not notice trouble swallowing and he passed his swallow evaluation.  No facial droop.  His symptoms lasted maybe 45-60 minutes total and completely resolved.    ED Course: [facility] to [facility] transfer, per Dr. Leafy Half:  Patient was hospitalized at [facility] on 9/26 and discharged on 9/27 for left-sided weakness thought to be secondary to a TIA.  Patient was discharged earlier in the day on 9/27 on dual  antiplatelet therapy but upon arriving to the pharmacy had recurrent left-sided weakness prompting the patient to come to med [facility] emergency department for repeat evaluation.  Upon evaluation, ER provider did confirm left-sided weakness including left facial droop.  CT head performed revealing nothing acute.  Case discussed with Dr. Iver Nestle with neurology who recommends hospitalization to [facility] with Dr. Derry Lory with neurology to consult upon arrival.  EEG recommended.  Review of Systems: As per HPI; otherwise review of systems reviewed and negative.   Ambulatory Status:  Ambulates without assistance  COVID Vaccine Status:  Complete plus booster  Past Medical History:  Diagnosis Date   High cholesterol    Hypertension    Stroke (HCC) 05/01/2020   Tobacco dependence     Past Surgical History:  Procedure Laterality Date   implantable loop recorder placement  06/05/2020   [employer] Reveal [place] model RCV89 [id number]) implantable loop recorder     Social History   Socioeconomic History   Marital status: Divorced    Spouse name: Not on file   Number of children: Not on file   Years of education: Not on file   Highest education level: Not on file  Occupational History   Occupation: retired  Tobacco Use   Smoking status: Every Day    Packs/day: 1.00    Years: 20.00    Pack years: 20.00  Types: Cigarettes   Smokeless tobacco: Never  Vaping Use   Vaping Use: Never used  Substance and Sexual Activity   Alcohol use: Yes    Alcohol/week: 1.0 standard drink    Types: 1 Cans of beer per week    Comment: remote h/o heavy use   Drug use: Never   Sexual activity: Not on file  Other Topics Concern   Not on file  Social History Narrative   Not on file   Social Determinants of Health   Financial Resource Strain: Not on file  Food Insecurity: Not on file  Transportation Needs: Not on file  Physical Activity: Not on file  Stress: Not on file  Social  Connections: Not on file  Intimate Partner Violence: Not on file    No Known Allergies  Family History  Family history unknown: Yes    Prior to Admission medications   Medication Sig Start Date End Date Taking? Authorizing Provider  acetaminophen (TYLENOL) 500 MG tablet Take 500-1,000 mg by mouth every 8 (eight) hours as needed for mild pain (or headaches).     [provider]  albuterol (PROVENTIL HFA;VENTOLIN HFA) 108 (90 Base) MCG/ACT inhaler Inhale 2 puffs into the lungs every 6 (six) hours as needed for wheezing or shortness of breath. 09/21/17   Purohit, Salli Quarry, MD  aspirin 81 MG EC tablet Take 1 tablet (81 mg total) by mouth daily for 21 days. Swallow whole. 03/27/21 04/17/21  Lanae Boast, MD  atorvastatin (LIPITOR) 80 MG tablet Take 80 mg by mouth daily. 05/18/20   [provider]  buPROPion (WELLBUTRIN XL) 150 MG 24 hr tablet Take 150 mg by mouth daily. 01/15/21   [provider]  clopidogrel (PLAVIX) 75 MG tablet Take 1 tablet (75 mg total) by mouth daily. 03/28/21 05/27/21  Lanae Boast, MD  losartan (COZAAR) 50 MG tablet Take 50 mg by mouth daily. 05/02/20   [provider]  Multiple Vitamins-Minerals (CENTRUM SILVER 50+MEN) TABS Take 1 tablet by mouth daily with breakfast.    [provider]  Spacer/Aero Chamber Mouthpiece MISC 1 Units by Does not apply route every 4 (four) hours as needed. 09/21/17   Purohit, Salli Quarry, MD    Physical Exam: Vitals:   03/28/21 1400 03/28/21 1405 03/28/21 1506 03/28/21 1629  BP: 118/77  (!) 141/92 (!) 140/91  Pulse:  71 76 73  Resp:  20 18 18   Temp:   98.4 F (36.9 C) 98.6 F (37 C)  TempSrc:   Oral Oral  SpO2:  95% 100% 92%  Weight:      Height:         General:  Appears calm and comfortable and is in NAD Eyes:  EOMI, normal lids, iris ENT:  grossly normal hearing, lips & tongue, mmm Neck:  no LAD, masses or thyromegaly Cardiovascular:  RRR, no m/r/g. No LE edema.  Respiratory:   CTA  bilaterally with no wheezes/rales/rhonchi.  Normal respiratory effort. Abdomen:  soft, NT, ND Skin:  no rash or induration seen on limited exam Musculoskeletal:  grossly normal tone BUE/BLE with scant weakness of LUE/LLE, good ROM, no bony abnormality Psychiatric:  grossly normal mood and affect, speech fluent and appropriate, AOx3 Neurologic:  CN 2-12 grossly intact, moves all extremities in coordinated fashion, sensation intact    Radiological Exams on Admission: Independently reviewed - see discussion in A/P where applicable  CT HEAD WO CONTRAST ( )  Result Date: 03/27/2021 CLINICAL DATA:  Left-sided weakness for 1 day EXAM:  CT HEAD WITHOUT CONTRAST TECHNIQUE: Contiguous axial images were obtained from the base of the skull through the vertex without intravenous contrast. COMPARISON:  03/27/2021 at 9 a.m. FINDINGS: Brain: Confluent hypodensities within periventricular white matter are stable consistent with chronic small vessel ischemic change. No signs of acute infarct or hemorrhage. The lateral ventricles and midline structures are stable, with dense bilateral basal ganglia calcifications again noted. No acute extra-axial fluid collections. No mass effect. Vascular: No hyperdense vessel or unexpected calcification. Skull: Normal. Negative for fracture or focal lesion. Sinuses/Orbits: No acute finding. Other: None. IMPRESSION: 1. No acute intracranial process. 2. Stable chronic small-vessel ischemic changes. No change since MRI performed earlier today. Electronically Signed   By: Sharlet Salina M.D.   On: 03/27/2021 19:05   MR BRAIN WO CONTRAST  Result Date: 03/27/2021 CLINICAL DATA:  TIA EXAM: MRI HEAD WITHOUT CONTRAST TECHNIQUE: Multiplanar, multiecho pulse sequences of the brain and surrounding structures were obtained without intravenous contrast. COMPARISON:  CT/CTA head neck 03/25/2021 FINDINGS: Brain: There is no evidence of acute intracranial hemorrhage, extra-axial fluid collection,  or acute infarct. There are small remote lacunar infarcts in the bilateral cerebellar hemispheres. Additional remote infarcts are seen in the right aspect of the corpus callosum and parasagittal frontal lobe in the ACA territory (10-16, 10-22). There is moderate global parenchymal volume loss with commensurate enlargement of the ventricular system. There is extensive confluent FLAIR signal abnormality throughout the supratentorial white matter likely reflecting sequela of advanced chronic white matter microangiopathy. There is no mass lesion.  There is no midline shift. Vascular: Normal flow voids. Skull and upper cervical spine: Normal marrow signal. Sinuses/Orbits: The paranasal sinuses are clear. The globes and orbits are unremarkable. Other: None. IMPRESSION: 1. No acute intracranial hemorrhage or infarct. 2. Moderate parenchymal volume loss and advanced chronic white matter microangiopathy. 3. Small remote lacunar infarcts in the bilateral cerebellar hemispheres and remote infarcts in the right ACA distribution. Electronically Signed   By: Lesia Hausen M.D.   On: 03/27/2021 09:02   ECHOCARDIOGRAM COMPLETE  Result Date: 03/27/2021    ECHOCARDIOGRAM REPORT   Patient Name:   ANEUDY CHAMPLAIN Date of Exam: 03/27/2021 Medical Rec #:  [id number]       Height:       72.0 in Accession #:    [id number]      Weight:       190.0 lb Date of Birth:  18-Sep-1940       BSA:          2.085 m Patient Age:    80 years        BP:           140/87 mmHg Patient Gender: M               HR:           84 bpm. Exam Location:  Inpatient Procedure: 2D Echo, Color Doppler and Cardiac Doppler Indications:    Stroke i63.9  History:        Patient has prior history of Echocardiogram examinations, most                 recent 05/13/2020. Arrythmias:Atrial Fibrillation.  Sonographer:    Irving Burton Senior RDCS Referring Phys: [id number] Emeline General IMPRESSIONS  1. Left ventricular ejection fraction, by estimation, is 70 to 75%. The left ventricle has  hyperdynamic function. The left ventricle has no regional wall motion abnormalities. Left ventricular diastolic parameters are consistent with Grade I diastolic dysfunction (impaired  relaxation).  2. Right ventricular systolic function is normal. The right ventricular size is normal.  3. The mitral valve is normal in structure. No evidence of mitral valve regurgitation. No evidence of mitral stenosis.  4. The aortic valve is normal in structure. Aortic valve regurgitation is trivial. No aortic stenosis is present.  5. Aortic dilatation noted. There is mild dilatation of the ascending aorta, measuring 44 mm.  6. The inferior vena cava is normal in size with greater than 50% respiratory variability, suggesting right atrial pressure of 3 mmHg. Comparison(s): No significant change from prior study. Conclusion(s)/Recommendation(s): No intracardiac source of embolism detected on this transthoracic study. A transesophageal echocardiogram is recommended to exclude cardiac source of embolism if clinically indicated. FINDINGS  Left Ventricle: Left ventricular ejection fraction, by estimation, is 70 to 75%. The left ventricle has hyperdynamic function. The left ventricle has no regional wall motion abnormalities. The left ventricular internal cavity size was normal in size. There is no left ventricular hypertrophy. Left ventricular diastolic parameters are consistent with Grade I diastolic dysfunction (impaired relaxation). Right Ventricle: The right ventricular size is normal. No increase in right ventricular wall thickness. Right ventricular systolic function is normal. Left Atrium: Left atrial size was normal in size. Right Atrium: Right atrial size was normal in size. Pericardium: There is no evidence of pericardial effusion. Mitral Valve: The mitral valve is normal in structure. No evidence of mitral valve regurgitation. No evidence of mitral valve stenosis. Tricuspid Valve: The tricuspid valve is normal in structure.  Tricuspid valve regurgitation is not demonstrated. No evidence of tricuspid stenosis. Aortic Valve: The aortic valve is normal in structure. Aortic valve regurgitation is trivial. No aortic stenosis is present. Pulmonic Valve: The pulmonic valve was normal in structure. Pulmonic valve regurgitation is not visualized. No evidence of pulmonic stenosis. Aorta: Aortic dilatation noted. There is mild dilatation of the ascending aorta, measuring 44 mm. Venous: The inferior vena cava is normal in size with greater than 50% respiratory variability, suggesting right atrial pressure of 3 mmHg. IAS/Shunts: No atrial level shunt detected by color flow Doppler.  LEFT VENTRICLE PLAX 2D LVIDd:         3.60 cm  Diastology LVIDs:         2.00 cm  LV e' medial:    4.35 cm/s LV PW:         1.10 cm  LV E/e' medial:  10.3 LV IVS:        1.00 cm  LV e' lateral:   5.00 cm/s LVOT diam:     1.90 cm  LV E/e' lateral: 9.0 LV SV:         37 LV SV Index:   18 LVOT Area:     2.84 cm  RIGHT VENTRICLE RV S prime:     10.40 cm/s TAPSE (M-mode): 2.0 cm LEFT ATRIUM             Index       RIGHT ATRIUM           Index LA diam:        2.50 cm 1.20 cm/m  RA Area:     15.20 cm LA Vol (A2C):   32.6 ml 15.64 ml/m RA Volume:   37.00 ml  17.75 ml/m LA Vol (A4C):   31.4 ml 15.06 ml/m LA Biplane Vol: 32.7 ml 15.69 ml/m  AORTIC VALVE LVOT Vmax:   66.60 cm/s LVOT Vmean:  50.200 cm/s LVOT VTI:    0.132 m  AORTA  Ao Root diam: 4.20 cm Ao Asc diam:  4.40 cm MITRAL VALVE MV Area (PHT): 2.66 cm    SHUNTS MV Decel Time: 285 msec    Systemic VTI:  0.13 m MV E velocity: 45.00 cm/s  Systemic Diam: 1.90 cm MV A velocity: 86.50 cm/s MV E/A ratio:  0.52 Donato Schultz MD Electronically signed by Donato Schultz MD Signature Date/Time: 03/27/2021/12:23:35 PM    Final     EKG: Independently reviewed.  Sinus tachycardia with rate 103; nonspecific ST changes with no evidence of acute ischemia   Labs on Admission: I have personally reviewed the available labs and imaging  studies at the time of the admission.  Pertinent labs:   Unremarkable BMP Normal CBC A1c 6.2 on 9/27 Lipids on 9/27: 135/38/83/71   Assessment/Plan Principal Problem:   Left-sided weakness Active Problems:   Essential hypertension   HLD (hyperlipidemia)   Mood disorder (HCC)   Stroke-like symptoms -Patient with prior h/o CVA; here this week for stroke-like symptoms which have waxed and waned and are again recurrent -Currently symptom-free -Concerning for TIA/CVA/unstable plaque but also possibly seizure/Todd's paralysis -Aspirin and Plavix had been stopped prior to prior hospitalization but were resumed at that time -Will place in observation status for CVA/TIA evaluation -Telemetry monitoring -Has loop recorder in place -EEG ordered -Neurology consult -PT/OT/ST/Nutrition Consults  HTN -Allow permissive HTN for now -Treat BP only if >220/120, and then with goal of 15% reduction -Hold ARB and plan to restart in 48-72 hours unless neurology does not recommend HTN urgency   HLD -Continue Lipitor 80 mg daily   Mood d/o -Continue Wellbutrin      Note: This patient has been tested and is negative for the novel coronavirus COVID-19. He has been fully vaccinated against COVID-19.    DVT prophylaxis:  Lovenox  Code Status: Full - confirmed with patient/family Family Communication: Daughter present throughout evaluation Disposition Plan:  The patient is from: home  Anticipated d/c is to: home without [facility] services once her cardiology issues have been resolved.  Anticipated d/c date will depend on clinical response to treatment, but possibly as early as tomorrow if she has excellent response to treatment  Patient is currently: acutely ill Consults called: Neurology; PT/OT/ST/Nutrition; [facility] team Admission status: It is my clinical opinion that referral for OBSERVATION is reasonable and necessary in this patient based on the above information provided. The aforementioned  taken together are felt to place the patient at high risk for further clinical deterioration. However it is anticipated that the patient may be medically stable for discharge from the hospital within 24 to 48 hours.       Jonah Blue MD [facility]   How to contact the [facility] Attending or Consulting provider 7A - 7P or covering provider during after hours 7P -7A, for this patient?  Check the care team in [facility] and look for a) attending/consulting [facility] provider listed and b) the [facility] team listed Log into [email or web address] and use [facility] universal password to access. If you do not have the password, please contact the hospital operator. Locate the [facility] provider you are looking for under [facility] and page to a number that you can be directly reached. If you still have difficulty reaching the provider, please page the [facility] (Director on Call) for the Hospitalists listed on amion for assistance.   03/28/2021, 5:43 PM

## 2021-03-28 NOTE — Progress Notes (Signed)
Patient arrived to 3w-34. Admitting MD paged.

## 2021-03-29 ENCOUNTER — Observation Stay (HOSPITAL_COMMUNITY): Payer: Medicare Other

## 2021-03-29 DIAGNOSIS — E7800 Pure hypercholesterolemia, unspecified: Secondary | ICD-10-CM | POA: Diagnosis not present

## 2021-03-29 DIAGNOSIS — F39 Unspecified mood [affective] disorder: Secondary | ICD-10-CM

## 2021-03-29 DIAGNOSIS — R531 Weakness: Secondary | ICD-10-CM | POA: Diagnosis not present

## 2021-03-29 DIAGNOSIS — R569 Unspecified convulsions: Secondary | ICD-10-CM

## 2021-03-29 DIAGNOSIS — I10 Essential (primary) hypertension: Secondary | ICD-10-CM | POA: Diagnosis not present

## 2021-03-29 NOTE — Evaluation (Signed)
Occupational Therapy Evaluation Patient Details Name: Adrian Rodgers MRN: [id number] DOB: March 29, 1941 Today's Date: 03/29/2021   History of Present Illness Pt is a 80 y.o. male who presented 03/27/21 with L-sided weakness, speech changes, dazed appearance, and facial droop. He was also hospitalized for this from 9/25-27 and it was thought to be due to TIA. EEG work-up pending. 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 PTA pt reported that he was independent with all ADL's and IADL's, using no DME. At this time, pt is overall at a close supervision to min guard for safety, as he has mild balance deficits. Additionally, pt experiencing mild incoordination in both his LUE and LLE, with some mild weakness in his LUE compared to his RUE. At this time pt will benefit from [facility] once discharged, however if he continues to progress he may not need any follow up OT. OT will follow acutely.    Recommendations for follow up therapy are one component of a multi-disciplinary discharge planning process, led by the attending physician.  Recommendations may be updated based on patient status, additional functional criteria and insurance authorization.   Follow Up Recommendations  Home health OT    Equipment Recommendations  None recommended by OT    Recommendations for Other Services       Precautions / Restrictions Precautions Precautions: Fall Precaution Comments: cEEG Restrictions Weight Bearing Restrictions: No      Mobility Bed Mobility Overal bed mobility: Modified Independent             General bed mobility comments: Pt able to come to sit R EOB with HOB elevated without assistance.     Transfers Overall transfer level: Needs assistance Equipment used: None Transfers: Sit to/from Stand Sit to Stand: Min guard;+2 safety/equipment         General transfer comment: Pt with posterior push upon coming to stand, but able to correct self without assistance, min guard for safety, +2 for line management.    Balance Overall balance assessment: Needs assistance Sitting-balance support: Feet supported Sitting balance-Leahy Scale: Good   Postural control: Posterior lean Standing balance support: No upper extremity supported;During functional activity Standing balance-Leahy Scale: Fair Standing balance comment: Instability noted with up to minA intermittently to prevent posterior LOB. Able to reach hands down to knee level with bending over.                           ADL either performed or assessed with clinical judgement   ADL Overall ADL's : Needs assistance/impaired                                       General ADL Comments: All seated ADL's are at baseline/independent. When pt is completing ADL' tasks in standing or transferring, he needs close supervision to min guard for safety due to mild balance deficits.     Vision Baseline Vision/History: 1 Wears glasses Ability to See in Adequate Light: 0 Adequate Patient Visual Report: No change from baseline Vision Assessment?: No apparent visual deficits     Perception Perception Perception Tested?: No   Praxis Praxis  Praxis tested?: Within functional limits    Pertinent Vitals/Pain Pain Assessment: No/denies pain     Hand Dominance Right   Extremity/Trunk Assessment Upper Extremity Assessment Upper Extremity Assessment: LUE deficits/detail;Overall [facility] for tasks assessed LUE Deficits / Details: LUE appears to be slightly weaker than his RUE, as well as mild fine motor incoordination. LUE Sensation: WNL LUE Coordination: decreased fine motor   Lower Extremity Assessment Lower  Extremity Assessment: Defer to PT evaluation LLE Deficits / Details: Strength symmetric and WFL to R leg; able to detect light touch with precision bil; incoordination noted on L with foot tapping and heel to shin LLE Sensation: WNL LLE Coordination: decreased fine motor;decreased gross motor   Cervical / Trunk Assessment Cervical / Trunk Assessment: Normal   Communication Communication Communication: No difficulties   Cognition Arousal/Alertness: Awake/alert Behavior During Therapy: WFL for tasks assessed/performed Overall Cognitive Status: Within Functional Limits for tasks assessed                                     General Comments  VSS on RA    Exercises     Shoulder Instructions      Home Living Family/patient expects to be discharged to:: Private residence Living Arrangements: Alone Available Help at Discharge: Family;Available 24 hours/day Type of Home: House Home Access: Stairs to enter [employer] of Steps: 5 Entrance Stairs-Rails: Right Home Layout: One level     Bathroom Shower/Tub: Chief Strategy Officer: Handicapped height Bathroom Accessibility: Yes How Accessible: Accessible via walker Home Equipment: Walker - 2 wheels;Shower seat   Additional Comments: Daughter lives close by and checks on him often      Prior Functioning/Environment Level of Independence: Independent        Comments: drives, dtr has groceries delivered; does not use AD at baseline for mobility; denies falls in past 6 months        OT Problem List: Decreased strength;Decreased activity tolerance;Impaired balance (sitting and/or standing);Decreased safety awareness      OT Treatment/Interventions: Self-care/ADL training;Therapeutic exercise;Energy conservation;DME and/or AE instruction;Therapeutic activities;Patient/family education;Balance training    OT Goals(Current goals can be found in the care plan section) Acute Rehab OT  Goals Patient Stated Goal: to go home OT Goal Formulation: With patient Time For Goal Achievement: 04/11/21 Potential to Achieve Goals: Good ADL Goals Pt Will Perform Grooming: with modified independence;standing Pt Will Perform Lower Body Bathing: with modified independence;sitting/lateral leans;sit to/from stand Pt Will Perform Lower Body Dressing: with modified independence;sitting/lateral leans;sit to/from stand Pt Will Transfer to Toilet: with modified independence;ambulating Pt Will Perform Toileting - Clothing Manipulation and hygiene: with modified independence;sitting/lateral leans;sit to/from stand  OT Frequency: Min 2X/week   Barriers to D/C:            Co-evaluation PT/OT/SLP Co-Evaluation/Treatment: Yes Reason for Co-Treatment: For patient/therapist safety;To address functional/ADL transfers;Other (comment) (to manage lines) PT goals addressed during session: Mobility/safety with mobility;Balance OT goals addressed during session: ADL's and self-care;Strengthening/ROM      AM-PAC OT "6 Clicks" Daily Activity     Outcome Measure Help from another person eating meals?: None Help from another person taking care of personal grooming?: A Little Help from another person toileting, which includes using toliet, bedpan, or urinal?: A Little Help from another person bathing (including washing, rinsing, drying)?: A Little Help from another person to put on and taking off regular upper body clothing?: A Little Help from  another person to put on and taking off regular lower body clothing?: A Little 6 Click Score: 19   End of Session Equipment Utilized During Treatment: Gait belt Nurse Communication: Mobility status  Activity Tolerance: Patient tolerated treatment well Patient left: in chair;with call bell/phone within reach;with chair alarm set  OT Visit Diagnosis: Unsteadiness on feet (R26.81);Other abnormalities of gait and mobility (R26.89);Muscle weakness (generalized)  (M62.81)                Time: 7412-8786 OT Time Calculation (min): 25 min Charges:  OT General Charges $OT Visit: 1 Visit OT Evaluation $OT Eval Moderate Complexity: 1 Mod OT Treatments $Therapeutic Activity: 8-22 mins  Brandon Wiechman H., OTR/L Acute Rehabilitation  Kyo Cocuzza Elane Jalynn Waddell 03/29/2021, 12:09 PM

## 2021-03-29 NOTE — Progress Notes (Signed)
LTM EEG hooked up and running - no initial skin breakdown - push button tested - neuro notified. [facility] monitoring.  

## 2021-03-29 NOTE — Evaluation (Addendum)
Physical Therapy Evaluation Patient Details Name: Adrian Rodgers MRN: [id number] DOB: 16-Jun-1941 Today's Date: 03/29/2021  History of Present Illness  Pt is a 80 y.o. male who presented 03/27/21 with L-sided weakness, speech changes, dazed appearance, and facial droop. He was also hospitalized for this from 9/25-27 and it was thought to be due to TIA. EEG work-up pending. 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 PTA, he was independent without use of an AD for mobility, living alone in a 1-level house with 5 STE and 1 handrail. Pt denies any falls in past 6 months. Currently, pt displays fairly symmetrical and intact bil lower extremity strength and sensation. However, he displays coordination deficits in his L lower extremity, likely causing his excessive ankle reactional strategies in standing and thus impacting his balance. Pt with intermittent posterior trunk sway, needing up to minA to maintain his balance with room mobility without UE support. Pt limited in mobility distance by being hooked up to EEG at this time. Pt would benefit from further acute PT to address his deficits and reduce his risk for falls. He may also benefit from follow-up with HHPT but may progress to not needing any follow-up PT by d/c, will continue to assess and monitor acutely.       Recommendations for follow up therapy are one component of a multi-disciplinary discharge planning process, led by the attending physician.  Recommendations may be updated based on patient status, additional functional criteria and insurance authorization.  Follow Up Recommendations Home health PT;Supervision  for mobility/OOB (pending progression may not need follow-up PT)    Equipment Recommendations  None recommended by PT    Recommendations for Other Services       Precautions / Restrictions Precautions Precautions: Fall Precaution Comments: cEEG Restrictions Weight Bearing Restrictions: No      Mobility  Bed Mobility Overal bed mobility: Modified Independent             General bed mobility comments: Pt able to come to sit R EOB with HOB elevated without assistance.    Transfers Overall transfer level: Needs assistance Equipment used: None Transfers: Sit to/from Stand Sit to Stand: Min guard;+2 safety/equipment         General transfer comment: Pt with posterior push upon coming to stand, but able to correct self without assistance, min guard for safety, +2 for line management.  Ambulation/Gait Ambulation/Gait assistance: Min guard;Min assist;+2 safety/equipment Gait Distance (Feet): 40 Feet Assistive device: None Gait Pattern/deviations: Step-through pattern;Decreased stride length;Leaning posteriorly Gait velocity: reduced Gait velocity interpretation: <1.31 ft/sec, indicative of household ambulator General Gait Details: Pt with tendency to lean posteriorly slightly with excessive L ankle reactional strategies, needing up to light minA intermittently to prevent posterior LOB. Otherwise, slow gait with min guard assist. Limited in distance by cEEG.  Stairs            Wheelchair Mobility    Modified Rankin (Stroke Patients Only) Modified Rankin (Stroke Patients Only) Pre-Morbid Rankin Score: No significant disability Modified Rankin: Moderately severe disability     Balance Overall balance assessment: Needs assistance Sitting-balance support: Feet supported Sitting balance-Leahy Scale: Good   Postural control: Posterior lean Standing balance support: No upper extremity  supported;During functional activity Standing balance-Leahy Scale:  Fair Standing balance comment: Instability noted with up to minA intermittently to prevent posterior LOB. Able to reach hands down to knee level with bending over.                             Pertinent Vitals/Pain Pain Assessment: No/denies pain    Home Living Family/patient expects to be discharged to:: Private residence Living Arrangements: Alone Available Help at Discharge: Family;Available 24 hours/day Type of Home: House Home Access: Stairs to enter Entrance Stairs-Rails: Right Entrance Stairs-Number of Steps: 5 Home Layout: One level Home Equipment: Walker - 2 wheels;Shower seat Additional Comments: Daughter lives close by and checks on him often    Prior Function Level of Independence: Independent         Comments: drives, dtr has groceries delivered; does not use AD at baseline for mobility; denies falls in past 6 months     Hand Dominance   Dominant Hand: Right    Extremity/Trunk Assessment   Upper Extremity Assessment Upper Extremity Assessment: Defer to OT evaluation    Lower Extremity Assessment Lower Extremity Assessment: LLE deficits/detail LLE Deficits / Details: Strength symmetric and WFL to R leg; able to detect light touch with precision bil; incoordination noted on L with foot tapping and heel to shin LLE Sensation: WNL LLE Coordination: decreased fine motor;decreased gross motor    Cervical / Trunk Assessment Cervical / Trunk Assessment: Normal  Communication   Communication: No difficulties  Cognition Arousal/Alertness: Awake/alert Behavior During Therapy: WFL for tasks assessed/performed Overall Cognitive Status: Within Functional Limits for tasks assessed                                        General Comments      Exercises     Assessment/Plan    PT Assessment Patient needs continued PT services  PT Problem List Decreased activity tolerance;Decreased balance;Decreased mobility;Decreased  coordination       PT Treatment Interventions DME instruction;Gait training;Stair training;Functional mobility training;Therapeutic activities;Therapeutic exercise;Balance training;Neuromuscular re-education;Patient/family education    PT Goals (Current goals can be found in the Care Plan section)  Acute Rehab PT Goals Patient Stated Goal: to get HHPT PT Goal Formulation: With patient Time For Goal Achievement: 04/12/21 Potential to Achieve Goals: Good    Frequency Min 4X/week   Barriers to discharge        Co-evaluation PT/OT/SLP Co-Evaluation/Treatment: Yes Reason for Co-Treatment: For patient/therapist safety;To address functional/ADL transfers;Other (comment) (to manage lines) PT goals addressed during session: Mobility/safety with mobility;Balance         AM-PAC PT "6 Clicks" Mobility  Outcome Measure Help needed turning from your back to your side while in a flat bed without using bedrails?: None Help needed moving from lying on your back to sitting on the side of a flat bed without using bedrails?: None Help needed moving to and from a bed to a chair (including a wheelchair)?: A Little Help needed standing up from a chair using your arms (e.g., wheelchair or bedside chair)?: A Little Help needed to walk in hospital room?: A Little Help needed climbing 3-5 steps with a railing? : A Little 6 Click Score: 20    End of Session Equipment Utilized During Treatment: Gait belt Activity Tolerance: Patient tolerated treatment well Patient left: in chair;with call bell/phone within reach;with chair  alarm set Nurse Communication: Mobility status PT Visit Diagnosis: Unsteadiness on feet (R26.81);Other abnormalities of gait and mobility (R26.89);Difficulty in walking, not elsewhere classified (R26.2);Other symptoms and signs involving the nervous system (R29.898)    Time: 4580-9983 PT Time Calculation (min) (ACUTE ONLY): 23 min   Charges:   PT Evaluation $PT Eval Moderate  Complexity: 1 Mod          Raymond Gurney, PT, DPT Acute Rehabilitation Services  Pager: [phone] Office: [phone]   Jewel Baize 03/29/2021, 11:20 AM

## 2021-03-29 NOTE — Progress Notes (Signed)
Nutrition Brief Note  RD consulted to assess pt's nutritional status and needs.  Admitting Dx: TIA (transient ischemic attack) [G45.9] Left-sided weakness [R53.1] PMH:  Past Medical History:  Diagnosis Date   High cholesterol    Hypertension    Stroke (HCC) 05/01/2020   Tobacco dependence    Medications:   aspirin EC  81 mg Oral Daily   atorvastatin  80 mg Oral Daily   buPROPion  150 mg Oral Daily   clopidogrel  75 mg Oral Daily   enoxaparin (LOVENOX) injection  40 mg Subcutaneous Q24H   nicotine  14 mg Transdermal Once  Labs: Recent Labs  Lab 03/25/21 1903 03/27/21 1743  NA 137 137  K 3.5 3.7  CL 104 103  CO2 25 25  BUN 15 26*  CREATININE 1.30* 1.11  CALCIUM 9.2 9.3  GLUCOSE 120* 104*   Weight: Wt Readings from Last 15 Encounters:  03/27/21 86.6 kg  03/25/21 86.2 kg  06/05/20 80.9 kg  05/12/20 83 kg  09/19/17 80 kg  Body mass index is 25.9 kg/m. Patient meets criteria for overweight based on current BMI.   Current diet order is heart healthy; patient is consuming approximately 100% of meals at this time. Discussed pt with RN who reports no issues. Pt remains observation status at this time with likely discharge tomorrow. Pt w/o complaints. No nutrition interventions warranted at this time. If nutrition issues arise, please re-consult RD.    Eugene Gavia, MS, RD, LDN (she/her/hers) RD pager number and weekend/on-call pager number located in Amion.

## 2021-03-29 NOTE — Progress Notes (Signed)
Subjective: No further episodes. He has had no convusions, just two episodes of negative symptoms.   Exam: Vitals:   03/29/21 0530 03/29/21 0731  BP: 126/76 131/90  Pulse:  78  Resp: 18 20  Temp: 98.4 F (36.9 C) 98.5 F (36.9 C)  SpO2: 98% 94%   Gen: In bed, NAD Resp: non-labored breathing, no acute distress Abd: soft, nt  Neuro: MS: awake, alert, interactive and appropriate CN: VFF, EOMI Motor: no drift in UE Sensory:intact to LT  Pertinent Labs: Cr 1.3  Impression: 80 yo M with 2 episodes of left sided weakness with recent TIA workup. I do not think that needs to be repeated. Given his history of stroke, there was concern that these two episodes could be due to focal seizure and therefore LTM EEG has been started.   Recommendations: 1) 24 hours of LTM EEG, if negative, would not continue 2) AEDs only if EEG is positive.  3) continue ASA+plavix 4) will follow.   Ritta Slot, MD [facility] [phone]  If 7pm- 7am, please page neurology on call as listed in AMION.

## 2021-03-29 NOTE — Progress Notes (Signed)
EEG complete - results pending 

## 2021-03-29 NOTE — Progress Notes (Signed)
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  He was hospitalized for the same from 9/25-9/27 which was thought to be due to TIA.  He was discharged on dual antiplatelet therapy but upon arriving to the pharmacy he had recurrent left-sided weakness prompting the patient to come to the med [facility] ER for PT evaluation.  CT head was negative.  Neurology recommended hospitalization to [facility] for possible EEG.    Subjective   Patient seen and examined, currently on LTM EEG.  Denies any weakness of left upper extremity.  Symptoms have resolved.   Assessment/Plan:     Strokelike symptoms -Was seen by neurology, he had recent TIA work-up -Neurology does not recommend to repeat the work-up at this time -EEG did not show epileptiform discharges -Currently undergoing LTM EEG -Continue aspirin and Plavix   Hypertension -Blood pressure is stable -ARB was held for permissive hypertension -Continue to hold losartan as blood pressure is stable at this time -We will restart losartan if blood pressure starts to rise  Hyperlipidemia -Continue Lipitor 80 mg daily       Data Reviewed:   CBG:  Recent Labs  Lab 03/25/21 1856  GLUCAP 117*    SpO2: 96 %    Vitals:   03/29/21 0530 03/29/21 0731 03/29/21 1144 03/29/21 1544  BP: 126/76 131/90 112/78 131/88  Pulse:  78 (!) 102 87  Resp: 18 20 20 18   Temp: 98.4 F (36.9 C) 98.5 F (36.9 C) 98.5 F (36.9 C) 98.4 F (36.9 C)  TempSrc: Oral Oral Oral Oral  SpO2: 98% 94% 96% 96%  Weight:      Height:         Intake/Output Summary (Last 24 hours) at 03/29/2021 1601 Last data filed at 03/29/2021 1150 Gross per 24 hour  Intake --  Output 200 ml  Net -200 ml    No  intake/output data recorded.  Filed Weights   03/27/21 1711  Weight: 86.6 kg    Data Reviewed: Basic Metabolic Panel: Recent Labs  Lab 03/25/21 1903 03/27/21 1743  NA 137 137  K 3.5 3.7  CL 104 103  CO2 25 25  GLUCOSE 120* 104*  BUN 15 26*  CREATININE 1.30* 1.11  CALCIUM 9.2 9.3   Liver Function Tests: Recent Labs  Lab 03/25/21 1903  AST 12*  ALT 25  ALKPHOS 77  BILITOT 0.7  PROT 7.3  ALBUMIN 4.0   No results for input(s): LIPASE, AMYLASE in the last 168 hours. No results for input(s): AMMONIA in the last 168 hours. CBC: Recent Labs  Lab 03/25/21 1903 03/27/21 1743  WBC 7.1 5.8  NEUTROABS 3.8  --   HGB 14.6 14.8  HCT 43.7 43.9  MCV 93.6 93.4  PLT 383 397   Cardiac Enzymes: No results for input(s): CKTOTAL, CKMB, CKMBINDEX, TROPONINI in the last 168 hours. BNP (last 3 results) No results for input(s): BNP in the last 8760 hours.  ProBNP (last 3 results) No results for input(s): PROBNP in the last 8760 hours.  CBG: Recent Labs  Lab 03/25/21 1856  GLUCAP 117*    Recent Results (from the past 240 hour(s))  Resp Panel by RT-PCR (Flu A&B, Covid) Nasopharyngeal Swab     Status: None   Collection Time: 03/25/21  7:25 PM   Specimen: Nasopharyngeal Swab; Nasopharyngeal(NP) swabs in vial transport medium  Result Value Ref Range Status   SARS Coronavirus 2 by RT PCR NEGATIVE NEGATIVE Final    Comment: (NOTE) SARS-CoV-2 target nucleic acids are NOT DETECTED.  The SARS-CoV-2 RNA is generally detectable in upper respiratory specimens during the acute phase of infection. The lowest concentration of SARS-CoV-2 viral copies this assay can detect is 138 copies/mL. A negative result does not preclude SARS-Cov-2 infection and should not be used as the sole basis for treatment or other patient management decisions. A negative result may occur with  improper specimen collection/handling, submission of specimen other than nasopharyngeal swab, presence of viral  mutation(s) within the areas targeted by this assay, and inadequate number of viral copies(<138 copies/mL). A negative result must be combined with clinical observations, patient history, and epidemiological information. The expected result is Negative.  Fact Sheet for Patients:  BloggerCourse.com  Fact Sheet for Healthcare Providers:  SeriousBroker.it  This test is no t yet approved or cleared by the [place] FDA and  has been authorized for detection and/or diagnosis of SARS-CoV-2 by FDA under an Emergency Use Authorization (EUA). This EUA will remain  in effect (meaning this test can be used) for the duration of the COVID-19 declaration under Section 564(b)(1) of the Act, 21 U.S.C.section 360bbb-3(b)(1), unless the authorization is terminated  or revoked sooner.       Influenza A by PCR NEGATIVE NEGATIVE Final   Influenza B by PCR NEGATIVE NEGATIVE Final    Comment: (NOTE) The Xpert Xpress SARS-CoV-2/FLU/RSV plus assay is intended as an aid in the diagnosis of influenza from Nasopharyngeal swab specimens and should not be used as a sole basis for treatment. Nasal washings and aspirates are unacceptable for Xpert Xpress SARS-CoV-2/FLU/RSV testing.  Fact Sheet for Patients: BloggerCourse.com  Fact Sheet for Healthcare Providers: SeriousBroker.it  This test is not yet approved or cleared by the [place] FDA and has been authorized for detection and/or diagnosis of SARS-CoV-2 by FDA under an Emergency Use Authorization (EUA). This EUA will remain in effect (meaning this test can be used) for the duration of the COVID-19 declaration under Section 564(b)(1) of the Act, 21 U.S.C. section 360bbb-3(b)(1), unless the authorization is terminated or revoked.  Performed at [facility], [address] [place], [place] [address]      Radiology Reports  CT  HEAD WO CONTRAST ( )  Result Date: 03/27/2021 CLINICAL DATA:  Left-sided weakness for 1 day EXAM: CT HEAD WITHOUT CONTRAST TECHNIQUE: Contiguous axial images were obtained from the base of the skull through the vertex without intravenous contrast. COMPARISON:  03/27/2021 at 9 a.m. FINDINGS: Brain: Confluent hypodensities within periventricular white matter are stable consistent with chronic small vessel ischemic change. No signs of acute infarct or hemorrhage. The lateral ventricles and midline structures are stable, with dense bilateral basal ganglia calcifications again noted. No acute extra-axial fluid collections. No mass effect. Vascular: No hyperdense vessel or unexpected calcification. Skull: Normal. Negative for fracture or focal lesion. Sinuses/Orbits: No acute finding. Other: None. IMPRESSION: 1. No acute intracranial process. 2. Stable chronic small-vessel ischemic changes. No change since MRI performed earlier today. Electronically Signed   By: Sharlet Salina M.D.   On: 03/27/2021 19:05   EEG adult  Result Date: 03/29/2021 Charlsie Quest, MD     03/29/2021 11:21 AM Patient Name: Adrian Rodgers MRN: [id number] Epilepsy Attending: Charlsie Quest Referring Physician/Provider: Dr Jonah Blue Date:03/29/2021 Duration: 24.09 mins  Patient history: 80 y.o. male with PMH significant for HLD, prior R frontal stroke, smoker, presenting with recurrent episodes of left leg weakness and numbness that self resolve. EEG to evaluate for seizure. Level of alertness: Awake AEDs during EEG study: None Technical aspects: This EEG study was done with scalp electrodes positioned according to the 10-20 International system of electrode placement. Electrical activity was acquired at a sampling rate of 500Hz  and reviewed with a high frequency filter of 70Hz  and a low frequency filter of 1Hz . EEG data were recorded continuously and digitally stored. Description: The posterior dominant rhythm consists of 9-10 Hz  activity of moderate voltage (25-35 uV) seen predominantly in posterior head regions, symmetric and reactive to eye opening and eye closing. Hyperventilation and photic stimulation were not performed.   IMPRESSION: This study is within normal limits. No seizures or epileptiform discharges were seen throughout the recording. Priyanka     Scheduled medications:    aspirin EC  81 mg Oral Daily   atorvastatin  80 mg Oral Daily   buPROPion  150 mg Oral Daily   clopidogrel  75 mg Oral Daily   enoxaparin (LOVENOX) injection  40 mg Subcutaneous Q24H    Antibiotics: Anti-infectives (From admission, onward)    None         DVT prophylaxis: Lovenox  Code Status: Full code  Family Communication: No family at bedside   Consultants: Neurology  Procedures:     Objective    Physical Examination:   General-appears in no acute distress Heart-S1-S2, regular, no murmur auscultated Lungs-clear to auscultation bilaterally, no wheezing or crackles auscultated Abdomen-soft, nontender, no organomegaly Extremities-no edema in the lower extremities Neuro-alert, oriented x3, no focal deficit noted  Status is: Inpatient  Dispo: The patient is from: Home              Anticipated d/c is to: Home              Anticipated d/c date is: 03/30/2021              Patient currently not stable for discharge  Barrier to discharge-getting evaluation for seizure, LTM EEG monitoring  COVID-19 Labs  No results for input(s): DDIMER, FERRITIN, LDH, CRP in the last 72 hours.  Lab Results  Component Value Date   SARSCOV2NAA NEGATIVE 03/25/2021   SARSCOV2NAA NEGATIVE 05/12/2020              04/01/2021   [facility] If 7PM-7AM, please contact night-coverage at [email or web address], Office  [phone]   03/29/2021, 4:01 PM  LOS: 0 days

## 2021-03-29 NOTE — Evaluation (Signed)
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   Left-sided weakness 03/27/2021   TIA (transient ischemic attack) 03/25/2021   Essential hypertension 05/13/2020   HLD (hyperlipidemia) 05/13/2020   Acute CVA (cerebrovascular accident) (HCC) 05/12/2020   Community acquired pneumonia of right middle lobe of lung 09/20/2017   Sepsis (HCC) 09/19/2017   Past Medical History:  Past Medical History:  Diagnosis Date   High cholesterol    Hypertension    Stroke (HCC) 05/01/2020   Tobacco dependence    Past Surgical History:  Past Surgical History:  Procedure Laterality Date   implantable loop recorder placement  06/05/2020   [employer] Reveal [place] model [id number] G) implantable loop recorder    HPI:  Pt is a 80 y.o. male who presented 03/27/21 with L-sided weakness, speech changes, dazed appearance, and facial droop. He was also hospitalized for this from 9/25-27 and it was thought to be due to TIA. EEG work-up pending. 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, smoker   Assessment / Plan / Recommendation Clinical Impression  Pt suspected to be at baseline functioning for cognition, no family member or caregiver present to determine if acute change in thinking skills have occured. Pt however reports feels as though he is at baseline functioning. Pt oriented x4, states he has intermittent word finding difficulties (described consistent with anomic aphasia though none exhibited during interaction). [employer]  Mental Scale administered (15/30) with deficits appreciated in delayed recall, mental manipulation, and executive function. Pt states he does have help from daughters for medicine management at baseline. Recommend supervision from family for all complex ADLs to ensure safety. No further ST needs identified as pt reporting at baseline functioning and will have necessary level of help at DC.    SLP Assessment  SLP Recommendation/Assessment: Patient does not need any further Speech Lanaguage Pathology Services SLP Visit Diagnosis: Cognitive communication deficit (R41.841)    Recommendations for follow up therapy are one component of a multi-disciplinary discharge planning process, led by the attending physician.  Recommendations may be updated based on patient status, additional functional criteria and insurance authorization.    Follow Up Recommendations     none  Frequency and Duration   N/a        SLP Evaluation Cognition  Overall Cognitive Status: No family/caregiver present to determine baseline cognitive functioning (suspected to be at baseline per pt report) Arousal/Alertness: Awake/alert Orientation Level: Oriented X4 Attention: Focused;Sustained Focused Attention: Appears intact Memory: Impaired Memory Impairment: Decreased recall of new information;Decreased short term memory Awareness: Appears intact Problem Solving: Impaired Problem Solving Impairment: Verbal complex;Functional complex Executive Function: Organizing;Self Monitoring Organizing: Impaired Self Monitoring: Impaired Safety/Judgment: Appears intact       Comprehension  Auditory Comprehension Overall Auditory Comprehension: Appears within functional limits for tasks assessed Reading Comprehension Reading Status: Within funtional limits    Expression Expression Primary Mode of Expression: Verbal Verbal Expression Overall Verbal Expression: Appears within functional limits for tasks assessed (describes  intermittent anomia, none appreciated during assessment) Written Expression Dominant Hand: Right   Oral / Motor  Oral Motor/Sensory Function Overall  Oral Motor/Sensory Function: Within functional limits Motor Speech Overall Motor Speech: Appears within functional limits for tasks assessed   GO                    Ardyth Gal MA, CCC-SLP Acute Rehabilitation Services   03/29/2021, 2:11 PM

## 2021-03-29 NOTE — Care Management Obs Status (Signed)
MEDICARE OBSERVATION STATUS NOTIFICATION   Patient Details  Name: Adrian Rodgers MRN: [id number] Date of Birth: 12/08/1940   Medicare Observation Status Notification Given:  Yes    Kermit Balo, RN 03/29/2021, 3:32 PM

## 2021-03-29 NOTE — Plan of Care (Signed)
  Problem: Health Behavior/Discharge Planning: Goal: Ability to manage health-related needs will improve Outcome: Progressing   Problem: Activity: Goal: Risk for activity intolerance will decrease Outcome: Progressing   Problem: Pain Managment: Goal: General experience of comfort will improve Outcome: Progressing   Problem: Safety: Goal: Ability to remain free from injury will improve Outcome: Progressing   Problem: Education: Goal: Knowledge of disease or condition will improve 03/29/2021 0620 by Thom Chimes, RN Outcome: Progressing 03/29/2021 0619 by Thom Chimes, RN Outcome: Progressing Goal: Knowledge of patient specific risk factors addressed and post discharge goals established will improve 03/29/2021 0620 by Thom Chimes, RN Outcome: Progressing 03/29/2021 0619 by Thom Chimes, RN Outcome: Progressing   Problem: Education: Goal: Expressions of having a comfortable level of knowledge regarding the disease process will increase Outcome: Progressing   Problem: Coping: Goal: Ability to adjust to condition or change in health will improve Outcome: Progressing Goal: Ability to identify appropriate support needs will improve Outcome: Progressing   Problem: Health Behavior/Discharge Planning: Goal: Compliance with prescribed medication regimen will improve Outcome: Progressing   Problem: Medication: Goal: Risk for medication side effects will decrease Outcome: Progressing   Problem: Clinical Measurements: Goal: Complications related to the disease process, condition or treatment will be avoided or minimized Outcome: Progressing Goal: Diagnostic test results will improve Outcome: Progressing   Problem: Safety: Goal: Verbalization of understanding the information provided will improve Outcome: Progressing   Problem: Self-Concept: Goal: Level of anxiety will decrease Outcome: Progressing Goal: Ability to verbalize feelings about condition will  improve Outcome: Progressing

## 2021-03-29 NOTE — Plan of Care (Signed)
  Problem: Education: Goal: Knowledge of disease or condition will improve Outcome: Progressing Goal: Knowledge of patient specific risk factors addressed and post discharge goals established will improve Outcome: Progressing   

## 2021-03-29 NOTE — Procedures (Signed)
Patient Name: Adrian Rodgers  MRN: [id number]  Epilepsy Attending: Charlsie Quest  Referring Physician/Provider: Dr Jonah Blue Date:03/29/2021 Duration: 24.09 mins  Patient history: 80 y.o. male with PMH significant for HLD, prior R frontal stroke, smoker, presenting with recurrent episodes of left leg weakness and numbness that self resolve. EEG to evaluate for seizure.   Level of alertness: Awake  AEDs during EEG study: None  Technical aspects: This EEG study was done with scalp electrodes positioned according to the 10-20 International system of electrode placement. Electrical activity was acquired at a sampling rate of 500Hz  and reviewed with a high frequency filter of 70Hz  and a low frequency filter of 1Hz . EEG data were recorded continuously and digitally stored.   Description: The posterior dominant rhythm consists of 9-10 Hz activity of moderate voltage (25-35 uV) seen predominantly in posterior head regions, symmetric and reactive to eye opening and eye closing. Hyperventilation and photic stimulation were not performed.     IMPRESSION: This study is within normal limits. No seizures or epileptiform discharges were seen throughout the recording.  Kdyn Vonbehren 

## 2021-03-30 DIAGNOSIS — E785 Hyperlipidemia, unspecified: Secondary | ICD-10-CM

## 2021-03-30 DIAGNOSIS — I10 Essential (primary) hypertension: Secondary | ICD-10-CM | POA: Diagnosis not present

## 2021-03-30 DIAGNOSIS — R531 Weakness: Secondary | ICD-10-CM | POA: Diagnosis not present

## 2021-03-30 DIAGNOSIS — R569 Unspecified convulsions: Secondary | ICD-10-CM | POA: Diagnosis not present

## 2021-03-30 DIAGNOSIS — G459 Transient cerebral ischemic attack, unspecified: Principal | ICD-10-CM

## 2021-03-30 LAB — CBC
HCT: 39.7 % (ref 39.0–52.0)
Hemoglobin: 13 g/dL (ref 13.0–17.0)
MCH: 31.2 pg (ref 26.0–34.0)
MCHC: 32.7 g/dL (ref 30.0–36.0)
MCV: 95.2 fL (ref 80.0–100.0)
Platelets: 341 10*3/uL (ref 150–400)
RBC: 4.17 MIL/uL — ABNORMAL LOW (ref 4.22–5.81)
RDW: 14 % (ref 11.5–15.5)
WBC: 5.6 10*3/uL (ref 4.0–10.5)
nRBC: 0 % (ref 0.0–0.2)

## 2021-03-30 LAB — BASIC METABOLIC PANEL
Anion gap: 6 (ref 5–15)
BUN: 16 mg/dL (ref 8–23)
CO2: 27 mmol/L (ref 22–32)
Calcium: 8.9 mg/dL (ref 8.9–10.3)
Chloride: 106 mmol/L (ref 98–111)
Creatinine, Ser: 1.03 mg/dL (ref 0.61–1.24)
GFR, Estimated: >60 mL/min (ref >60)
Glucose, Bld: 110 mg/dL — ABNORMAL HIGH (ref 70–99)
Potassium: 3.7 mmol/L (ref 3.5–5.1)
Sodium: 139 mmol/L (ref 135–145)

## 2021-03-30 MED ORDER — NICOTINE 21 MG/24HR TD PT24
21.0000 mg | MEDICATED_PATCH | Freq: Every day | TRANSDERMAL | Status: DC
  Administered 2021-03-30: 21 mg via TRANSDERMAL
  Filled 2021-03-30: qty 1

## 2021-03-30 NOTE — TOC Initial Note (Signed)
Transition of Care [facility]) - Initial/Assessment Note    Patient Details  Name: Adrian Rodgers MRN: [id number] Date of Birth: Feb 13, 1941  [facility] CM/SW Contact:    Kermit Balo, RN Phone Number: 03/30/2021, 8:33 AM  Clinical Narrative:                 Patient lives at home alone. He has 2 daughters that live pretty close and assist when needed. Daughter does his pill box for him. Pt was driving prior to this admission but states one of his daughters could provide assistance.  Pt would like to use [facility] for [facility] services if still needed at d/c.  TOC following.  Expected Discharge Plan: Home w Home Health Services Barriers to Discharge: Continued Medical Work up   Patient Goals and CMS Choice   CMS Medicare.gov Compare Post Acute Care list provided to:: Patient Choice offered to / list presented to : Patient  Expected Discharge Plan and Services Expected Discharge Plan: Home w Home Health Services   Discharge Planning Services: CM Consult Post Acute Care Choice: Home Health Living arrangements for the past 2 months: Single Family Home                           HH Arranged: PT, OT          Prior Living Arrangements/Services Living arrangements for the past 2 months: Single Family Home Lives with:: Self Patient language and need for interpreter reviewed:: Yes Do you feel safe going back to the place where you live?: Yes        Care giver support system in place?: No (comment) Current home services: DME (walker/ shower seat/ elevated commode) Criminal Activity/Legal Involvement Pertinent to Current Situation/Hospitalization: No - Comment as needed  Activities of Daily Living Home Assistive Devices/Equipment: Environmental consultant (specify type), Shower chair with back, Eyeglasses ADL Screening (condition at time of admission) Patient's cognitive ability adequate to safely complete daily activities?: Yes Is the patient deaf or have difficulty  hearing?: No Does the patient have difficulty seeing, even when wearing glasses/contacts?: No Does the patient have difficulty concentrating, remembering, or making decisions?: Yes Patient able to express need for assistance with ADLs?: Yes Does the patient have difficulty dressing or bathing?: No Independently performs ADLs?: Yes (appropriate for developmental age) Does the patient have difficulty walking or climbing stairs?: Yes Weakness of Legs: Left Weakness of Arms/Hands: None  Permission Sought/Granted                  Emotional Assessment Appearance:: Appears stated age Attitude/Demeanor/Rapport: Engaged Affect (typically observed): Accepting Orientation: : Oriented to Self, Oriented to Place, Oriented to  Time, Oriented to Situation   Psych Involvement: No (comment)  Admission diagnosis:  TIA (transient ischemic attack) [G45.9] Left-sided weakness [R53.1] Patient Active Problem List   Diagnosis Date Noted   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   Left-sided weakness 03/27/2021   TIA (transient ischemic attack) 03/25/2021   Essential hypertension 05/13/2020   HLD (hyperlipidemia) 05/13/2020   Acute CVA (cerebrovascular accident) (HCC) 05/12/2020   Community acquired pneumonia of right middle lobe of lung 09/20/2017   Sepsis (HCC) 09/19/2017   PCP:  Elsie Amis, MD Pharmacy:   [facility] [phone] - [place], [place] - [address] [place] [id number] Phone: [phone] Fax: [phone]     Social Determinants of Health (  SDOH) Interventions    Readmission Risk Interventions No flowsheet data found.

## 2021-03-30 NOTE — Discharge Summary (Signed)
Physician Discharge Summary  Adrian Rodgers ZOX:[id number] DOB: 1941/02/08 DOA: 03/27/2021  PCP: Elsie Amis, MD  Admit date: 03/27/2021 Discharge date: 03/30/2021  Time spent: 60 minutes  Recommendations for Outpatient Follow-up:  Follow-up neurology in 4 weeks   Discharge Diagnoses:  Principal Problem:   Left-sided weakness Active Problems:   Essential hypertension   HLD (hyperlipidemia)   Mood disorder [facility])   Discharge Condition: Stable  Diet recommendation: Heart healthy diet  Filed Weights   03/27/21 1711  Weight: 86.6 kg    History of present illness:  80 year old male with a 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  He was hospitalized for the same from 9/25-9/27 which was thought to be due to TIA.  He was discharged on dual antiplatelet therapy but upon arriving to the pharmacy he had recurrent left-sided weakness prompting the patient to come to the med [facility] ER for PT evaluation.  CT head was negative.  Neurology recommended hospitalization to [facility] for possible EEG.  Hospital Course:   Strokelike symptoms -Was seen by neurology, he had recent TIA work-up -Neurology does not recommend to repeat the work-up at this time -EEG did not show epileptiform discharges -Started on LTM EEG, did not show any seizure.   -Neurology feels that symptoms are likely from TIA.   -Continue aspirin and Plavix for total 3 weeks and then stop aspirin and continue with Plavix alone.   -Follow-up neurology as outpatient     Hypertension -Blood pressure is stable -Continue home medications   Hyperlipidemia -Continue Lipitor 80 mg daily    Procedures: EEG LTM EEG  Consultations: Neurology  Discharge Exam: Vitals:   03/29/21 2337 03/30/21 0430  BP: 127/77 121/86  Pulse: 81 72  Resp: 18 18  Temp: 98.6 F (37 C) 98 F (36.7 C)  SpO2: 94% 92%     General: Appears in no acute distress Cardiovascular: S1-S2, regular Respiratory: Clear to auscultation bilaterally  Discharge Instructions   Discharge Instructions     Diet - low sodium heart healthy   Complete by: As directed    Increase activity slowly   Complete by: As directed       Allergies as of 03/30/2021   No Known Allergies      Medication List     TAKE these medications    acetaminophen 500 MG tablet Commonly known as: TYLENOL Take 500-1,000 mg by mouth every 8 (eight) hours as needed for mild pain (or headaches).   albuterol 108 (90 Base) MCG/ACT inhaler Commonly known as: VENTOLIN HFA Inhale 2 puffs into the lungs every 6 (six) hours as needed for wheezing or shortness of breath.   aspirin 81 MG EC tablet Take 1 tablet (81 mg total) by mouth daily for 21 days. Swallow whole.   atorvastatin 80 MG tablet Commonly known as: LIPITOR Take 80 mg by mouth daily.   buPROPion 150 MG 24 hr tablet Commonly known as: WELLBUTRIN XL Take 150 mg by mouth daily.   Centrum Silver 50+Men Tabs Take 1 tablet by mouth daily with breakfast.   clopidogrel 75 MG tablet Commonly known as: PLAVIX Take 1 tablet (75 mg total) by mouth daily.   losartan 50 MG tablet Commonly known as: COZAAR Take 50 mg by mouth daily.   nicotine 14 mg/24hr patch Commonly known as: NICODERM CQ - dosed in mg/24 hours Place 14 mg onto the skin daily.   Spacer/Aero Chamber [employer]  1 Units by Does not apply route every 4 (four) hours as needed.       No Known Allergies    The results of significant diagnostics from this hospitalization (including imaging, microbiology, ancillary and laboratory) are listed below for reference.    Significant Diagnostic Studies: CT HEAD WO CONTRAST ( )  Result Date: 03/27/2021 CLINICAL DATA:  Left-sided weakness for 1 day EXAM: CT HEAD WITHOUT CONTRAST TECHNIQUE: Contiguous axial images were obtained from the base of the skull  through the vertex without intravenous contrast. COMPARISON:  03/27/2021 at 9 a.m. FINDINGS: Brain: Confluent hypodensities within periventricular white matter are stable consistent with chronic small vessel ischemic change. No signs of acute infarct or hemorrhage. The lateral ventricles and midline structures are stable, with dense bilateral basal ganglia calcifications again noted. No acute extra-axial fluid collections. No mass effect. Vascular: No hyperdense vessel or unexpected calcification. Skull: Normal. Negative for fracture or focal lesion. Sinuses/Orbits: No acute finding. Other: None. IMPRESSION: 1. No acute intracranial process. 2. Stable chronic small-vessel ischemic changes. No change since MRI performed earlier today. Electronically Signed   By: Sharlet Salina M.D.   On: 03/27/2021 19:05   MR BRAIN WO CONTRAST  Result Date: 03/27/2021 CLINICAL DATA:  TIA EXAM: MRI HEAD WITHOUT CONTRAST TECHNIQUE: Multiplanar, multiecho pulse sequences of the brain and surrounding structures were obtained without intravenous contrast. COMPARISON:  CT/CTA head neck 03/25/2021 FINDINGS: Brain: There is no evidence of acute intracranial hemorrhage, extra-axial fluid collection, or acute infarct. There are small remote lacunar infarcts in the bilateral cerebellar hemispheres. Additional remote infarcts are seen in the right aspect of the corpus callosum and parasagittal frontal lobe in the ACA territory (10-16, 10-22). There is moderate global parenchymal volume loss with commensurate enlargement of the ventricular system. There is extensive confluent FLAIR signal abnormality throughout the supratentorial white matter likely reflecting sequela of advanced chronic white matter microangiopathy. There is no mass lesion.  There is no midline shift. Vascular: Normal flow voids. Skull and upper cervical spine: Normal marrow signal. Sinuses/Orbits: The paranasal sinuses are clear. The globes and orbits are unremarkable.  Other: None. IMPRESSION: 1. No acute intracranial hemorrhage or infarct. 2. Moderate parenchymal volume loss and advanced chronic white matter microangiopathy. 3. Small remote lacunar infarcts in the bilateral cerebellar hemispheres and remote infarcts in the right ACA distribution. Electronically Signed   By: Lesia Hausen M.D.   On: 03/27/2021 09:02   EEG adult  Result Date: 03/29/2021 Charlsie Quest, MD     03/29/2021 11:21 AM Patient Name: Adrian Rodgers MRN: [id number] Epilepsy Attending: Charlsie Quest Referring Physician/Provider: Dr Jonah Blue Date:03/29/2021 Duration: 24.09 mins Patient history: 80 y.o. male with PMH significant for HLD, prior R frontal stroke, smoker, presenting with recurrent episodes of left leg weakness and numbness that self resolve. EEG to evaluate for seizure. Level of alertness: Awake AEDs during EEG study: None Technical aspects: This EEG study was done with scalp electrodes positioned according to the 10-20 International system of electrode placement. Electrical activity was acquired at a sampling rate of 500Hz  and reviewed with a high frequency filter of 70Hz  and a low frequency filter of 1Hz . EEG data were recorded continuously and digitally stored. Description: The posterior dominant rhythm consists of 9-10 Hz activity of moderate voltage (25-35 uV) seen predominantly in posterior head regions, symmetric and reactive to eye opening and eye closing. Hyperventilation and photic stimulation were not performed.   IMPRESSION: This study is within normal limits. No seizures or epileptiform discharges  were seen throughout the recording. Priyanka Annabelle Harman   Overnight EEG with video  Result Date: 03/30/2021 Charlsie Quest, MD     03/30/2021  9:35 AM Patient Name: Daemion Mcniel MRN: [id number] Epilepsy Attending: Charlsie Quest Referring Physician/Provider: Dr Erick Blinks Duration: 03/29/2021 1007 to 03/30/2021 0930  Patient history: 80 y.o. male with PMH  significant for HLD, prior R frontal stroke, smoker, presenting with recurrent episodes of left leg weakness and numbness that self resolve. EEG to evaluate for seizure.  Level of alertness: Awake, asleep  AEDs during EEG study: None  Technical aspects: This EEG study was done with scalp electrodes positioned according to the 10-20 International system of electrode placement. Electrical activity was acquired at a sampling rate of 500Hz  and reviewed with a high frequency filter of 70Hz  and a low frequency filter of 1Hz . EEG data were recorded continuously and digitally stored.  Description: The posterior dominant rhythm consists of 9-10 Hz activity of moderate voltage (25-35 uV) seen predominantly in posterior head regions, symmetric and reactive to eye opening and eye closing. Sleep was characterized by sleep spindles (12-14Hz 0, maximal frontocentral region. Hyperventilation and photic stimulation were not performed. Parts of study were difficult to interpret due to significant electrode artifact.    IMPRESSION: This study is within normal limits. No seizures or epileptiform discharges were seen throughout the recording.    ECHOCARDIOGRAM COMPLETE  Result Date: 03/27/2021    ECHOCARDIOGRAM REPORT   Patient Name:   OSHEA PERCIVAL Date of Exam: 03/27/2021 Medical Rec #:  03/29/2021       Height:       72.0 in Accession #:    Selmer Dominion      Weight:       190.0 lb Date of Birth:  Aug 18, 1940       BSA:          2.085 m Patient Age:    80 years        BP:           140/87 mmHg Patient Gender: M               HR:           84 bpm. Exam Location:  Inpatient Procedure: 2D Echo, Color Doppler and Cardiac Doppler Indications:    Stroke i63.9  History:        Patient has prior history of Echocardiogram examinations, most                 recent 05/13/2020. Arrythmias:Atrial Fibrillation.  Sonographer:    10/28/1940 Senior RDCS Referring Phys: 09-03-1991 05/15/2020 IMPRESSIONS  1. Left ventricular ejection  fraction, by estimation, is 70 to 75%. The left ventricle has hyperdynamic function. The left ventricle has no regional wall motion abnormalities. Left ventricular diastolic parameters are consistent with Grade I diastolic dysfunction (impaired relaxation).  2. Right ventricular systolic function is normal. The right ventricular size is normal.  3. The mitral valve is normal in structure. No evidence of mitral valve regurgitation. No evidence of mitral stenosis.  4. The aortic valve is normal in structure. Aortic valve regurgitation is trivial. No aortic stenosis is present.  5. Aortic dilatation noted. There is mild dilatation of the ascending aorta, measuring 44 mm.  6. The inferior vena cava is normal in size with greater than 50% respiratory variability, suggesting right atrial pressure of 3 mmHg. Comparison(s): No significant change from prior study. Conclusion(s)/Recommendation(s): No intracardiac source of embolism detected  on this transthoracic study. A transesophageal echocardiogram is recommended to exclude cardiac source of embolism if clinically indicated. FINDINGS  Left Ventricle: Left ventricular ejection fraction, by estimation, is 70 to 75%. The left ventricle has hyperdynamic function. The left ventricle has no regional wall motion abnormalities. The left ventricular internal cavity size was normal in size. There is no left ventricular hypertrophy. Left ventricular diastolic parameters are consistent with Grade I diastolic dysfunction (impaired relaxation). Right Ventricle: The right ventricular size is normal. No increase in right ventricular wall thickness. Right ventricular systolic function is normal. Left Atrium: Left atrial size was normal in size. Right Atrium: Right atrial size was normal in size. Pericardium: There is no evidence of pericardial effusion. Mitral Valve: The mitral valve is normal in structure. No evidence of mitral valve regurgitation. No evidence of mitral valve stenosis.  Tricuspid Valve: The tricuspid valve is normal in structure. Tricuspid valve regurgitation is not demonstrated. No evidence of tricuspid stenosis. Aortic Valve: The aortic valve is normal in structure. Aortic valve regurgitation is trivial. No aortic stenosis is present. Pulmonic Valve: The pulmonic valve was normal in structure. Pulmonic valve regurgitation is not visualized. No evidence of pulmonic stenosis. Aorta: Aortic dilatation noted. There is mild dilatation of the ascending aorta, measuring 44 mm. Venous: The inferior vena cava is normal in size with greater than 50% respiratory variability, suggesting right atrial pressure of 3 mmHg. IAS/Shunts: No atrial level shunt detected by color flow Doppler.  LEFT VENTRICLE PLAX 2D LVIDd:         3.60 cm  Diastology LVIDs:         2.00 cm  LV e' medial:    4.35 cm/s LV PW:         1.10 cm  LV E/e' medial:  10.3 LV IVS:        1.00 cm  LV e' lateral:   5.00 cm/s LVOT diam:     1.90 cm  LV E/e' lateral: 9.0 LV SV:         37 LV SV Index:   18 LVOT Area:     2.84 cm  RIGHT VENTRICLE RV S prime:     10.40 cm/s TAPSE (M-mode): 2.0 cm LEFT ATRIUM             Index       RIGHT ATRIUM           Index LA diam:        2.50 cm 1.20 cm/m  RA Area:     15.20 cm LA Vol (A2C):   32.6 ml 15.64 ml/m RA Volume:   37.00 ml  17.75 ml/m LA Vol (A4C):   31.4 ml 15.06 ml/m LA Biplane Vol: 32.7 ml 15.69 ml/m  AORTIC VALVE LVOT Vmax:   66.60 cm/s LVOT Vmean:  50.200 cm/s LVOT VTI:    0.132 m  AORTA Ao Root diam: 4.20 cm Ao Asc diam:  4.40 cm MITRAL VALVE MV Area (PHT): 2.66 cm    SHUNTS MV Decel Time: 285 msec    Systemic VTI:  0.13 m MV E velocity: 45.00 cm/s  Systemic Diam: 1.90 cm MV A velocity: 86.50 cm/s MV E/A ratio:  0.52 Donato Schultz MD Electronically signed by Donato Schultz MD Signature Date/Time: 03/27/2021/12:23:35 PM    Final    CT ANGIO HEAD NECK W WO CM (CODE STROKE)  Result Date: 03/25/2021 CLINICAL DATA:  Stroke/TIA, assess intracranial arteries. Stroke/TIA, assess  extracranial arteries. Provided history: Trouble with left leg movement.  Numbness on left. Stroke last year. EXAM: CT ANGIOGRAPHY HEAD AND NECK TECHNIQUE: Multidetector CT imaging of the head and neck was performed using the standard protocol during bolus administration of intravenous contrast. Multiplanar CT image reconstructions and MIPs were obtained to evaluate the vascular anatomy. Carotid stenosis measurements (when applicable) are obtained utilizing NASCET criteria, using the distal internal carotid diameter as the denominator. CONTRAST:  OMNIPAQUE IOHEXOL 350 MG/ML SOLN COMPARISON:  CT angiogram head/neck 05/13/2020. FINDINGS: CT HEAD FINDINGS Brain: Moderate generalized cerebral atrophy. Comparatively mild cerebellar atrophy. Severe patchy and ill-defined hypoattenuation within the cerebral white matter, nonspecific but compatible with chronic small vessel ischemic disease. Redemonstrated chronic lacunar infarct within the inferior right cerebellar hemisphere. Basal ganglia mineralization bilaterally. There is no acute intracranial hemorrhage. No acute demarcated cortical infarct. No extra-axial fluid collection. No evidence of an intracranial mass. No midline shift. Vascular: No hyperdense vessel.  Atherosclerotic calcifications Skull: Normal. Negative for fracture or focal lesion. Sinuses: No significant paranasal sinus disease. Orbits: No orbital mass or acute orbital finding. Review of the MIP images confirms the above findings These results were called by telephone at the time of interpretation on 03/25/2021 at 7:19 pm to provider DAVID YAO , who verbally acknowledged these results. CTA NECK FINDINGS Aortic arch: The left vertebral artery arises directly from the aortic arch. Common origin of the innominate and left common carotid arteries. Atherosclerotic plaque within the visualized aortic arch and proximal major branch vessels of the neck. No hemodynamically significant innominate or proximal  subclavian artery stenosis. Right carotid system: CCA and ICA patent within the neck without hemodynamically significant stenosis. Mild soft plaque within the proximal ICA. Tortuosity of the cervical ICA. Left carotid system: CCA and ICA patent within the neck without stenosis. Tortuosity of the cervical ICA. 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 Vertebral arteries: Patent within the neck without stenosis. Tortuosity of both vessels. The right vertebral artery is slightly dominant. Skeleton: Cervical spondylosis. No acute bony abnormality or aggressive osseous lesion. Other neck: No neck mass or cervical lymphadenopathy. Thyroid unremarkable. Upper chest: Centrilobular emphysema. 9 mm region of ground-glass opacity in the left lung apex, stable as compared to the CTA head/neck of 05/13/2020. Repeat CT is recommended every 2 years until 5 years of stability has been established. This recommendation follows the consensus statement: Guidelines for Management of Incidental Pulmonary Nodules Detected on CT Images: From the Fleischner Society 2017; Radiology 2017; 284:228-243. Review of the MIP images confirms the above findings CTA HEAD FINDINGS Anterior circulation: The intracranial internal carotid arteries are patent. Calcified plaque within both vessels with no more than mild stenosis. The M1 middle cerebral arteries are patent. Atherosclerotic irregularity of the M2 and more distal middle cerebral artery vessels bilaterally. Most notably, there is a progressive moderate stenosis within a proximal M2 right MCA vessel (series 14, image 54). The anterior cerebral arteries are patent. 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 No intracranial aneurysm is identified. Posterior circulation: The intracranial vertebral arteries are  patent. The basilar artery is patent. The posterior cerebral arteries are patent. Redemonstrated moderate/severe stenosis within the P1 right PCA. Mild stenosis of the P1 left PCA. Posterior communicating arteries are present bilaterally. Venous sinuses: Within the limitations of contrast timing, no convincing thrombus. Anatomic variants: As described. Review of the MIP images confirms the above findings No  intracranial large vessel occlusion. 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 These findings were discussed with Dr. Otelia Limes by telephone at 7:25 p.m. on 03/25/2021. IMPRESSION: CT head: 1. No evidence of acute intracranial abnormality. 2. Severe chronic small vessel ischemic disease. 3. Redemonstrated chronic lacunar infarct within the inferior right cerebellar hemisphere. 4. Generalized cerebral and cerebellar atrophy. CTA neck: 1. The bilateral common carotid and internal carotid arteries are patent within the neck without stenosis. Minimal soft plaque within the proximal right ICA. Tortuosity of both cervical internal carotid arteries. 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 2. Vertebral arteries patent within the neck without stenosis. Tortuosity of both vessels. 3. 9 mm region of ground-glass opacity in the left lung apex, stable as compared to the CTA head/neck of 05/13/2020. Follow-up chest CT is recommended every 2 years until 5 years of stability has been established. This recommendation follows the consensus statement: Guidelines for Management of Incidental Pulmonary Nodules Detected on CT Images: From the Fleischner Society 2017; Radiology 2017; 284:228-243. CTA head: 1. No intracranial large vessel occlusion identified. 2. 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 3. Atherosclerotic irregularity of the M2 and more distal MCA vessels bilaterally. Most notably, there is a progressive moderate stenosis within a proximal M2 right MCA vessel. 4. Atherosclerotic plaque within the intracranial internal carotid arteries bilaterally with no more than mild stenosis. 5. Unchanged moderate/severe stenosis of the P1 right PCA. 6. Unchanged mild stenosis of the P1 left PCA. Electronically Signed   By: Jackey Loge D.O.   On: 03/25/2021 19:48    Microbiology: Recent Results (from the past 240 hour(s))  Resp Panel by RT-PCR (Flu A&B, Covid) Nasopharyngeal Swab     Status: None   Collection Time: 03/25/21  7:25 PM   Specimen: Nasopharyngeal Swab; Nasopharyngeal(NP) swabs in vial transport medium  Result Value Ref Range Status   SARS Coronavirus 2 by RT PCR NEGATIVE NEGATIVE Final    Comment: (NOTE) SARS-CoV-2 target nucleic acids are NOT DETECTED.  The SARS-CoV-2 RNA is generally detectable in upper respiratory specimens during the acute phase of infection. The lowest concentration of SARS-CoV-2 viral copies this assay can detect is 138 copies/mL. A negative result does not preclude SARS-Cov-2 infection and should not be used as the sole basis for treatment or other patient management decisions. A negative result may occur with  improper specimen collection/handling, submission of specimen other than nasopharyngeal swab, presence of viral mutation(s) within the areas targeted by this assay, and inadequate number of viral copies(<138 copies/mL). A negative result must be combined with clinical observations, patient history, and epidemiological information. The expected result is Negative.  Fact Sheet for Patients:  BloggerCourse.com  Fact Sheet for Healthcare Providers:  SeriousBroker.it  This test is no t yet  approved or cleared by the [place] FDA and  has been authorized for detection and/or diagnosis of SARS-CoV-2 by FDA under an Emergency Use Authorization (EUA). This EUA will remain  in effect (meaning this test can be used) for the duration of the COVID-19 declaration under Section 564(b)(1) of the Act, 21 U.S.C.section 360bbb-3(b)(1), unless the authorization is terminated  or revoked sooner.       Influenza A by PCR NEGATIVE NEGATIVE Final   Influenza B  by PCR NEGATIVE NEGATIVE Final    Comment: (NOTE) The Xpert Xpress SARS-CoV-2/FLU/RSV plus assay is intended as an aid in the diagnosis of influenza from Nasopharyngeal swab specimens and should not be used as a sole basis for treatment. Nasal washings and aspirates are unacceptable for Xpert Xpress SARS-CoV-2/FLU/RSV testing.  Fact Sheet for Patients: BloggerCourse.com  Fact Sheet for Healthcare Providers: SeriousBroker.it  This test is not yet approved or cleared by the [place] FDA and has been authorized for detection and/or diagnosis of SARS-CoV-2 by FDA under an Emergency Use Authorization (EUA). This EUA will remain in effect (meaning this test can be used) for the duration of the COVID-19 declaration under Section 564(b)(1) of the Act, 21 U.S.C. section 360bbb-3(b)(1), unless the authorization is terminated or revoked.  Performed at [facility], [address] [place], [place] [address]      Labs: Basic Metabolic Panel: Recent Labs  Lab 03/25/21 1903 03/27/21 1743 03/30/21 0343  NA 137 137 139  K 3.5 3.7 3.7  CL 104 103 106  CO2 25 25 27   GLUCOSE 120* 104* 110*  BUN 15 26* 16  CREATININE 1.30* 1.11 1.03  CALCIUM 9.2 9.3 8.9   Liver Function Tests: Recent Labs  Lab 03/25/21 1903  AST 12*  ALT 25  ALKPHOS 77  BILITOT 0.7  PROT 7.3  ALBUMIN 4.0   No results for input(s): LIPASE, AMYLASE in the last 168 hours. No results for  input(s): AMMONIA in the last 168 hours. CBC: Recent Labs  Lab 03/25/21 1903 03/27/21 1743 03/30/21 0343  WBC 7.1 5.8 5.6  NEUTROABS 3.8  --   --   HGB 14.6 14.8 13.0  HCT 43.7 43.9 39.7  MCV 93.6 93.4 95.2  PLT 383 397 341   Cardiac Enzymes: No results for input(s): CKTOTAL, CKMB, CKMBINDEX, TROPONINI in the last 168 hours. BNP: BNP (last 3 results) No results for input(s): BNP in the last 8760 hours.  ProBNP (last 3 results) No results for input(s): PROBNP in the last 8760 hours.  CBG: Recent Labs  Lab 03/25/21 1856  GLUCAP 117*       Signed:  03/27/21 MD.  [facility] 03/30/2021, 11:34 AM

## 2021-03-30 NOTE — Progress Notes (Signed)
PT Treatment Note:  Pt agreeable to therapy session, c/o dizziness earlier in day when getting to bathroom but orthostatics stable when taken. Emphasis on stair sequencing and handout given to reinforce. Pt with improved safety during gait when using RW so encouraged him to use assistive device at home unless cleared by HHPT to not use it. Pt needing min guard for functional mobility tasks. Pt continues to benefit from PT services to progress toward functional mobility goals.   03/30/21 1213  PT Visit Information  Last PT Received On 03/30/21  Assistance Needed +1  History of Present Illness Pt is a 80 y.o. male who presented 03/27/21 with L-sided weakness, speech changes, dazed appearance, and facial droop. He was also hospitalized for this from 9/25-27 and it was thought to be due to TIA. EEG work-up pending. 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, smoker  Subjective Data  Subjective "I felt a little woozy earlier going to the bathroom."  Patient Stated Goal to go home  Precautions  Precautions Fall  Precaution Comments hx CVA L weakness  Restrictions  Weight Bearing Restrictions No  Pain Assessment  Pain Assessment Faces  Pain Score 0  Faces Pain Scale 0  Pain Intervention(s) Monitored during session  Cognition  Arousal/Alertness Awake/alert  Behavior During Therapy WFL for tasks assessed/performed  Overall Cognitive Status Within Functional Limits for tasks assessed  General Comments cooperative, some decreased short term memory with safety cues (hand placement, step sequencing with stairs) so handouts given to reinforce therex, IS use and step sequencing  Bed Mobility  Overal bed mobility Modified Independent  General bed mobility comments to/from L EOB  unassisted  Transfers  Overall transfer level Needs assistance  Equipment used None;Rolling walker (2 wheeled)  Transfers Sit to/from Stand  Sit to Stand Min guard  General transfer comment from EOB  Ambulation/Gait  Ambulation/Gait assistance Min guard;Supervision  Gait Distance (Feet) 100 Feet  Assistive device Rolling walker (2 wheeled);None  Gait Pattern/deviations Step-through pattern;Decreased stride length;Decreased step length - left;Decreased weight shift to left  General Gait Details min guard with no AD, mildly unsteady, Supervision with RW with improved steadiness/step length. recommended he use RW until cleared by HHPT  Gait velocity reduced  Gait velocity interpretation <1.31 ft/sec, indicative of household ambulator  Stairs Yes  Stairs assistance Min guard  Stair Management One rail Left;Two rails;Step to pattern;Forwards  Number of Stairs 2  General stair comments pt needing frequent cues for safety/step sequencing due to weaker LLE, pt encouraged to perform with single UE support of RW but pt tending to reach for other railing of RW for support despite cues. recommend he use gait belt and daughter standing on opposite side vs with rail and cane, handout given to reinforce proper sequencing. no buckling.  Modified Rankin (Stroke Patients Only)  Pre-Morbid Rankin Score 1  Modified Rankin 4  Balance  Overall balance assessment Needs assistance  Sitting-balance support Feet supported  Sitting balance-Leahy Scale Good  Sitting balance - Comments pt donned socks EOB without LOB  Postural control Posterior lean  Standing balance support No upper extremity supported;During functional activity  Standing balance-Leahy Scale Fair  Standing balance comment Instability noted with min guard for safety, pt tending to reach for furniture in room.  General Comments  General comments (skin integrity,  edema, etc.) pt endorses mild dizziness earlier when getting OOB but BP stable during  session, encouraged warm-up UE/LE exercises prior to OOB in the morning  PT - End of Session  Equipment Utilized During Treatment Gait belt  Activity Tolerance Patient tolerated treatment well  Patient left in bed;with call bell/phone within reach;with bed alarm set  Nurse Communication Mobility status;Other (comment) (pt needs IS, SpO2 was 92-95% and pt slightly wheezing)   PT - Assessment/Plan  PT Plan Current plan remains appropriate  PT Visit Diagnosis Unsteadiness on feet (R26.81);Other abnormalities of gait and mobility (R26.89);Difficulty in walking, not elsewhere classified (R26.2);Other symptoms and signs involving the nervous system (R29.898)  PT Frequency (ACUTE ONLY) Min 4X/week  Follow Up Recommendations Home health PT;Supervision for mobility/OOB  PT equipment Other (comment) (RW but he reports he has access to this.)  AM-PAC PT "6 Clicks" Mobility Outcome Measure (Version 2)  Help needed turning from your back to your side while in a flat bed without using bedrails? 4  Help needed moving from lying on your back to sitting on the side of a flat bed without using bedrails? 4  Help needed moving to and from a bed to a chair (including a wheelchair)? 3  Help needed standing up from a chair using your arms (e.g., wheelchair or bedside chair)? 3  Help needed to walk in hospital room? 3  Help needed climbing 3-5 steps with a railing?  3  6 Click Score 20  Consider Recommendation of Discharge To: Home with no services  Progressive Mobility  What is the highest level of mobility based on the progressive mobility assessment? Level 5 (Walks with assist in room/hall) - Balance while stepping forward/back and can walk in room with assist - Complete  Mobility Out of bed for toileting;Ambulated with assistance in hallway  PT Goal Progression  Progress towards PT goals Progressing toward goals  Acute Rehab PT Goals  PT Goal Formulation With patient  Time For Goal Achievement 04/12/21   Potential to Achieve Goals Good  PT Time Calculation  PT Start Time (ACUTE ONLY) 1146  PT Stop Time (ACUTE ONLY) 1213  PT Time Calculation (min) (ACUTE ONLY) 27 min  PT General Charges  $$ ACUTE PT VISIT 1 Visit  PT Treatments  $Gait Training 8-22 mins  $Therapeutic Activity 8-22 mins   *delayed entry for services earlier this date.

## 2021-03-30 NOTE — Progress Notes (Signed)
PT treatment note:   PTA called back to room to review info with daughter who had recently arrived for caregiver instruction. Pt given Incentive Spirometer and informational handout per MD order and reviewed use with pt able to demo back, encouraged him to utilize every hour or two x5-10 reps for endurance and improved pulmonary clearance. Per daughter, he had difficulty clearing secretions at times. Pt able to demo back use of device and also reviewed supine/seated/standing exercises for strengthening/improved hemodynamics. Pt continues to benefit from PT services to progress toward functional mobility goals. Continue to recommend HHPT. *delayed entry   03/30/21 1320  PT Visit Information  Last PT Received On 03/30/21  Assistance Needed +1  History of Present Illness Pt is a 80 y.o. male who presented 03/27/21 with L-sided weakness, speech changes, dazed appearance, and facial droop. He was also hospitalized for this from 9/25-27 and it was thought to be due to TIA. EEG work-up pending. 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, smoker  Subjective Data  Subjective "I'm almost ready to go."  Patient Stated Goal to go home  Precautions  Precautions Fall  Precaution Comments hx CVA L weakness  Restrictions  Weight Bearing Restrictions No  Pain Assessment  Pain Assessment No/denies pain  Faces Pain Scale 0  Pain Intervention(s) Monitored during session  Cognition  Arousal/Alertness Awake/alert  Behavior During Therapy WFL for tasks assessed/performed  Overall Cognitive Status Within Functional Limits for tasks assessed  General Comments cooperative, some decreased short term memory  so handouts given to reinforce therex, IS use and step sequencing  Bed Mobility  Overal  bed mobility Modified Independent  General bed mobility comments pt received in chair  Transfers  General transfer comment pt remained seated in chair, emphasis on IS use/caregiver instruction  Modified Rankin (Stroke Patients Only)  Pre-Morbid Rankin Score 1  Modified Rankin 4  Balance  Overall balance assessment Needs assistance  Sitting-balance support Feet supported  Sitting balance-Leahy Scale Good  General Comments  General comments (skin integrity, edema, etc.) VSS on RA, SpO2 92% so encouraged use of IS and SpO2 improved to 95%; pt encouraged to try compression socks to see if this helps with AM occasional dizziness  Exercises  Exercises Other exercises  Other Exercises  Other Exercises Incentive Spirometer x10 reps, 750-900 mL, handout given to reinforce encouraged him to attempt 10x/hour or at least every other hour  Other Exercises seated BLE AROM: ankle pumps, LAQ; supine BLE AROM: heel slides x10 reps ea (handout given to reinforce supine/standing exercises, daughter instructed on gait belt/guarding positions)  PT - End of Session  Activity Tolerance Patient tolerated treatment well  Patient left with call bell/phone within reach;in chair;with family/visitor present  Nurse Communication Mobility status;Other (comment) (pt asking to speak with case mgr)   PT - Assessment/Plan  PT Plan Current plan remains appropriate  PT Visit Diagnosis Unsteadiness on feet (R26.81);Other abnormalities of gait and mobility (R26.89);Difficulty in walking, not elsewhere classified (R26.2);Other symptoms and signs involving the nervous system (R29.898)  PT Frequency (ACUTE ONLY) Min 4X/week  Follow Up Recommendations Home health PT;Supervision for mobility/OOB  PT equipment Other (comment) (RW but he reports he has access to this.)  AM-PAC PT "6 Clicks" Mobility Outcome Measure (Version 2)  Help needed turning from your back to your  side while in a flat bed without using bedrails? 4  Help  needed moving from lying on your back to sitting on the side of a flat bed without using bedrails? 4  Help needed moving to and from a bed to a chair (including a wheelchair)? 3  Help needed standing up from a chair using your arms (e.g., wheelchair or bedside chair)? 3  Help needed to walk in hospital room? 3  Help needed climbing 3-5 steps with a railing?  3  6 Click Score 20  Consider Recommendation of Discharge To: Home with no services  Progressive Mobility  What is the highest level of mobility based on the progressive mobility assessment? Level 5 (Walks with assist in room/hall) - Balance while stepping forward/back and can walk in room with assist - Complete  Mobility Out of bed to chair with meals  PT Goal Progression  Progress towards PT goals Progressing toward goals  Acute Rehab PT Goals  PT Goal Formulation With patient  Time For Goal Achievement 04/12/21  Potential to Achieve Goals Good  PT Time Calculation  PT Start Time (ACUTE ONLY) 1310  PT Stop Time (ACUTE ONLY) 1320  PT Time Calculation (min) (ACUTE ONLY) 10 min  PT General Charges  $$ ACUTE PT VISIT 1 Visit  PT Treatments  $Therapeutic Exercise 8-22 mins

## 2021-03-30 NOTE — Procedures (Addendum)
Patient Name: Adrian Rodgers  MRN: [id number]  Epilepsy Attending: Charlsie Quest  Referring Physician/Provider: Dr Erick Blinks Duration: 03/29/2021 1007 to 03/30/2021 1116   Patient history: 80 y.o. male with PMH significant for HLD, prior R frontal stroke, smoker, presenting with recurrent episodes of left leg weakness and numbness that self resolve. EEG to evaluate for seizure.    Level of alertness: Awake, asleep   AEDs during EEG study: None   Technical aspects: This EEG study was done with scalp electrodes positioned according to the 10-20 International system of electrode placement. Electrical activity was acquired at a sampling rate of 500Hz  and reviewed with a high frequency filter of 70Hz  and a low frequency filter of 1Hz . EEG data were recorded continuously and digitally stored.    Description: The posterior dominant rhythm consists of 9-10 Hz activity of moderate voltage (25-35 uV) seen predominantly in posterior head regions, symmetric and reactive to eye opening and eye closing. Sleep was characterized by sleep spindles (12-14Hz 0, maximal frontocentral region. Hyperventilation and photic stimulation were not performed.  Parts of study were difficult to interpret due to significant electrode artifact.      IMPRESSION: This study is within normal limits. No seizures or epileptiform discharges were seen throughout the recording.   Jeff Frieden 

## 2021-03-30 NOTE — Progress Notes (Signed)
D/C no skin break down. [facility] notified.

## 2021-03-30 NOTE — Discharge Instructions (Signed)
Please continue aspirin and Plavix for 3 weeks afterwards stop taking aspirin and continue taking Plavix indefinitely as advised in previous discharge instructions   Please follow-up your doctor as instructed in a week time and call the office for appointment.

## 2021-03-30 NOTE — Progress Notes (Addendum)
Neurology Progress Note  S: Patient states he feels fine. No HA, n/v, jerking, twitching, shaking noted. He thinks his speech is slurred, but this has been there since last stroke.   O: Current vital signs: BP 121/86 (BP Location: Left Arm)   Pulse 72   Temp 98 F (36.7 C) (Oral)   Resp 18   Ht 6' (1.829 m)   Wt 86.6 kg   SpO2 92%   BMI 25.90 kg/m  Vital signs in last 24 hours: Temp:  [98 F (36.7 C)-98.6 F (37 C)] 98 F (36.7 C) (09/30 0430) Pulse Rate:  [72-102] 72 (09/30 0430) Resp:  [18-20] 18 (09/30 0430) BP: (112-135)/(77-91) 121/86 (09/30 0430) SpO2:  [92 %-96 %] 92 % (09/30 0430)  GENERAL: Elderly, but well appearing. Awake, alert in NAD. HEENT: Normocephalic and atraumatic. LTM on.  LUNGS: Normal respiratory effort.  Ext: warm.  NEURO:  Mental Status: Alert and oriented.   Speech/Language: speech is without aphasia or dysarthria.   Fluency, and comprehension intact.  Cranial Nerves:  PERRL. EOMI. Sensation intact and symmetrical to face and 4 extremities. Smile is symmetrical. Hearing intact to voice. Strength is 5/5 to UEs and LEs. Tone is normal. No drift.   Medications  Current Facility-Administered Medications:    acetaminophen (TYLENOL) tablet 650 mg, 650 mg, Oral, Q4H PRN **OR** acetaminophen (TYLENOL) 160 MG/5ML solution 650 mg, 650 mg, Per Tube, Q4H PRN **OR** acetaminophen (TYLENOL) suppository 650 mg, 650 mg, Rectal, Q4H PRN, Jonah Blue, MD   albuterol (PROVENTIL) (2.5 MG/3ML) 0.083% nebulizer solution 2.5 mg, 2.5 mg, Nebulization, Q6H PRN, Jonah Blue, MD   aspirin EC tablet 81 mg, 81 mg, Oral, Daily, Jonah Blue, MD, 81 mg at 03/29/21 0921   atorvastatin (LIPITOR) tablet 80 mg, 80 mg, Oral, Daily, Jonah Blue, MD, 80 mg at 03/29/21 5681   buPROPion (WELLBUTRIN XL) 24 hr tablet 150 mg, 150 mg, Oral, Daily, Jonah Blue, MD, 150 mg at 03/29/21 2751   clopidogrel (PLAVIX) tablet 75 mg, 75 mg, Oral, Daily, Jonah Blue, MD, 75 mg  at 03/29/21 0921   enoxaparin (LOVENOX) injection 40 mg, 40 mg, Subcutaneous, Q24H, Jonah Blue, MD, 40 mg at 03/29/21 2139   ondansetron [facility]) injection 4 mg, 4 mg, Intravenous, Q6H PRN, Jonah Blue, MD   senna-docusate (Senokot-S) tablet 1 tablet, 1 tablet, Oral, QHS PRN, Jonah Blue, MD  No new pertinent Labs No new Imaging EEG 9/29-9/30/22 is pending.   Assessment:  80 yo M with 2 episodes of left sided weakness with recent TIA workup. I do not think that needs to be repeated. Given his history of stroke, there was concern that these two episodes could be due to focal seizure and LTM EEG is pending today. If negative, his weakness is likely recrudescence of old stroke.   Impression: -2 episodes of left sided weakness with no new findings on MRI b.  -Concern for focal seizures with negative rEEG on 03/29/21.  -Remote small infarcts in bilateral cerebellar hemispheres and remote infarcts in right ACA distributon.   Recommendations/Plan:  -f/up reading for overnight cEEG this am.  -No AEDs needed if cEEG negative.  -If cEEG is negative, neurology will likely sign off.  -Continue ASA and Plavix.   Pt seen by Jimmye Norman, MSN, APN-BC/Nurse Practitioner/Neuro and later by MD. Note and plan to be edited as needed by MD.  Pager: [phone]

## 2021-03-30 NOTE — TOC Transition Note (Signed)
Transition of Care [facility]) - CM/SW Discharge Note   Patient Details  Name: Adrian Rodgers MRN: [id number] Date of Birth: 08-21-1940  [facility] CM/SW Contact:  Kermit Balo, RN Phone Number: 03/30/2021, 1:34 PM   Clinical Narrative:    Patient is discharging home with [facility] services through [place]. [facility] unable to accept. Information on the AVS. No new DME needs.  Pt has transport home.    Final next level of care: Home w Home Health Services Barriers to Discharge: No Barriers Identified   Patient Goals and CMS Choice   CMS Medicare.gov Compare Post Acute Care list provided to:: Patient Choice offered to / list presented to : Patient  Discharge Placement                       Discharge Plan and Services   Discharge Planning Services: CM Consult Post Acute Care Choice: Home Health                    HH Arranged: PT, OT [facility] Agency: [facility] Date [facility] Agency Contacted: 03/30/21   Representative spoke with at [facility] Agency: Amy  Social Determinants of Health (SDOH) Interventions     Readmission Risk Interventions No flowsheet data found.

## 2021-03-30 NOTE — Plan of Care (Signed)
  Problem: Health Behavior/Discharge Planning: Goal: Ability to manage health-related needs will improve Outcome: Progressing   Problem: Pain Managment: Goal: General experience of comfort will improve Outcome: Progressing   Problem: Safety: Goal: Ability to remain free from injury will improve Outcome: Progressing   Problem: Self-Concept: Goal: Level of anxiety will decrease Outcome: Progressing Goal: Ability to verbalize feelings about condition will improve Outcome: Progressing

## 2021-03-30 NOTE — Plan of Care (Signed)
  Problem: Education: Goal: Knowledge of disease or condition will improve Outcome: Progressing Goal: Knowledge of patient specific risk factors addressed and post discharge goals established will improve Outcome: Progressing   

## 2021-04-02 ENCOUNTER — Ambulatory Visit (INDEPENDENT_AMBULATORY_CARE_PROVIDER_SITE_OTHER): Payer: Medicare Other

## 2021-04-02 DIAGNOSIS — I639 Cerebral infarction, unspecified: Secondary | ICD-10-CM | POA: Diagnosis not present

## 2021-04-04 LAB — CUP PACEART REMOTE DEVICE CHECK
Date Time Interrogation Session: 20220929112900
Implantable Pulse Generator Implant Date: 20211206

## 2021-04-10 NOTE — Progress Notes (Signed)
[facility] Summary Report / Loop Recorder 

## 2021-05-01 LAB — CUP PACEART REMOTE DEVICE CHECK
Date Time Interrogation Session: 20221101113359
Implantable Pulse Generator Implant Date: 20211206

## 2021-05-07 ENCOUNTER — Ambulatory Visit (INDEPENDENT_AMBULATORY_CARE_PROVIDER_SITE_OTHER): Payer: Medicare Other

## 2021-05-07 DIAGNOSIS — I639 Cerebral infarction, unspecified: Secondary | ICD-10-CM | POA: Diagnosis not present

## 2021-05-11 NOTE — Progress Notes (Signed)
[facility] Summary Report / Loop Recorder 

## 2021-05-14 ENCOUNTER — Encounter: Payer: Self-pay | Admitting: Adult Health

## 2021-05-14 ENCOUNTER — Ambulatory Visit: Payer: Medicare Other | Admitting: Adult Health

## 2021-05-14 VITALS — BP 142/88 | HR 71 | Ht 72.0 in | Wt 186.0 lb

## 2021-05-14 DIAGNOSIS — I10 Essential (primary) hypertension: Secondary | ICD-10-CM | POA: Diagnosis not present

## 2021-05-14 DIAGNOSIS — I639 Cerebral infarction, unspecified: Secondary | ICD-10-CM

## 2021-05-14 DIAGNOSIS — G459 Transient cerebral ischemic attack, unspecified: Secondary | ICD-10-CM

## 2021-05-14 DIAGNOSIS — E785 Hyperlipidemia, unspecified: Secondary | ICD-10-CM

## 2021-05-14 DIAGNOSIS — I671 Cerebral aneurysm, nonruptured: Secondary | ICD-10-CM

## 2021-05-14 NOTE — Progress Notes (Signed)
[facility] [address] [place] [address] [phone]       HOSPITAL FOLLOW UP NOTE  [name] Date of Birth:  [birth year] Medical Record Number:  [id number]   Reason for Referral:  hospital stroke follow up    SUBJECTIVE:   CHIEF COMPLAINT:  Chief Complaint  Patient presents with   Follow-up    Rm 1 with daughter yolanda Pt is well and stable, completed PT No new concerns     HPI:   Adrian Rodgers is a 80 y.o. 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  Personally reviewed hospitalization pertinent progress notes, lab work and imaging.  Evaluated by Dr. 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  MRI negative acute infarct with small remote lacunar infarcts in bilateral cerebellar hemispheres.  CTA head/neck negative LVO.  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  EF 70 to 75%.  LDL 83.  A1c 6.2.  Recommended DAPT for 3 weeks and Plavix alone.  Loop recorder interrogated -no evidence of A. fib.  Therapy eval's without therapy needs and discharged home on 03/27/2021.  Return to ER same day for recurrent left-sided weakness and facial droop. EEG no evidence of epileptiform discharges and LTM EEG unremarkable. Eval by neuro with likely recurrent TIA and repeat work up not indicated.  Discharged back home on 03/30/2021.   Today, 05/14/2021, being seen today for hospital follow up accompanied by his daughter.  Previously seen via [place] video visit on 06/13/2020 but lost to follow up. Doing well since discharge. Back to prior level of functioning. Denies new stroke/TIA symptoms. Lives alone - does ADLs independently. Family will help with some IADLs as needed. Was driving prior to recent TIA but has  not yet returned to driving - daughter concerned regarding him returning back to driving due to concern of additional TIA. Per daughter, PCP advised that they will need neuro clearance prior to returning back to driving. Completed DAPT - remains on Plavix alone without side effects. Remains on atorvastatin 80mg  daily - denies side effects. Blood pressure today 142/88 - does not routinely monitor at home.  Loop recorder has not shown atrial fibrillation thus far.  No further concerns at this time      PERTINENT IMAGING   CT head No acute abnormality.  CTA head : No LVO CTA  neck :1. The bilateral common carotid and internal carotid arteries are patent within the neck without stenosis. Minimal soft plaque within the proximal right ICA. Tortuosity of both cervical internal carotid arteries. 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 2. Vertebral arteries patent within the neck without stenosis. Tortuosity of both vessels. 3. 9 mm region of ground-glass opacity in the left lung apex, stable as compared to the CTA head/neck of 05/13/2020 MRI  no acute infarct or hemorrhage. Small remote lacunar infarcts in bilateral cerebellar hemispheres. 2D Echo EF 70-75% LDL 83 HgbA1c 6.2   ROS:   14 system review of systems performed and negative with exception of those listed in HPI  PMH:  Past Medical History:  Diagnosis Date   High cholesterol    Hypertension    Stroke ([place]) 05/01/2020  Tobacco dependence     PSH:  Past Surgical History:  Procedure Laterality Date   implantable loop recorder placement  06/05/2020   Medtronic Reveal [place] model [id number] G) implantable loop recorder     Social History:  Social History   Socioeconomic History   Marital status: Divorced    Spouse name: Not on file   Number of children: Not on file   Years of education: Not on file    Highest education level: Not on file  Occupational History   Occupation: retired  Tobacco Use   Smoking status: Every Day    Packs/day: 1.00    Years: 20.00    Pack years: 20.00    Types: Cigarettes   Smokeless tobacco: Never  Vaping Use   Vaping Use: Never used  Substance and Sexual Activity   Alcohol use: Yes    Alcohol/week: 1.0 standard drink    Types: 1 Cans of beer per week    Comment: remote h/o heavy use   Drug use: Never   Sexual activity: Not on file  Other Topics Concern   Not on file  Social History Narrative   Not on file   Social Determinants of Health   Financial Resource Strain: Not on file  Food Insecurity: Not on file  Transportation Needs: Not on file  Physical Activity: Not on file  Stress: Not on file  Social Connections: Not on file  Intimate Partner Violence: Not on file    Family History:  Family History  Family history unknown: Yes    Medications:   Current Outpatient Medications on File Prior to Visit  Medication Sig Dispense Refill   acetaminophen (TYLENOL) 500 MG tablet Take 500-1,000 mg by mouth every 8 (eight) hours as needed for mild pain (or headaches).      albuterol (PROVENTIL HFA;VENTOLIN HFA) 108 (90 Base) MCG/ACT inhaler Inhale 2 puffs into the lungs every 6 (six) hours as needed for wheezing or shortness of breath. 1 Inhaler 2   atorvastatin (LIPITOR) 80 MG tablet Take 80 mg by mouth daily.     buPROPion (WELLBUTRIN XL) 150 MG 24 hr tablet Take 150 mg by mouth daily.     clopidogrel (PLAVIX) 75 MG tablet Take 1 tablet (75 mg total) by mouth daily. 30 tablet 1   losartan (COZAAR) 50 MG tablet Take 50 mg by mouth daily.     Multiple Vitamins-Minerals (CENTRUM SILVER 50+MEN) TABS Take 1 tablet by mouth daily with breakfast.     nicotine (NICODERM CQ - DOSED IN MG/24 HOURS) 14 mg/24hr patch Place 14 mg onto the skin daily.     No current facility-administered medications on file prior to visit.    Allergies:  No Known  Allergies    OBJECTIVE:  Physical Exam  Vitals:   05/14/21 1410  BP: (!) 142/88  Pulse: 71  Weight: 186 lb (84.4 kg)  Height: 6' (1.829 m)   Body mass index is 25.23 kg/m. No results found.  No flowsheet data found.   General: well developed, well nourished, very pleasant elderly African-American male, seated, in no evident distress Head: head normocephalic and atraumatic.   Neck: supple with no carotid or supraclavicular bruits Cardiovascular: regular rate and rhythm, no murmurs Musculoskeletal: no deformity Skin:  no rash/petichiae Vascular:  Normal pulses all extremities   Neurologic Exam Mental Status: Awake and fully alert.  Fluent speech and language.  Oriented to place and time. Recent and remote memory intact. Attention span, concentration and fund of knowledge  appropriate. Mood and affect appropriate.  Cranial Nerves: Fundoscopic exam reveals sharp disc margins. Pupils equal, briskly reactive to light. Extraocular movements full without nystagmus. Visual fields full to confrontation. Hearing intact. Facial sensation intact. Face, tongue, palate moves normally and symmetrically.  Motor: Normal bulk and tone. Normal strength in all tested extremity muscles Sensory.: intact to touch , pinprick , position and vibratory sensation.  Coordination: Rapid alternating movements normal in all extremities. Finger-to-nose and heel-to-shin performed accurately bilaterally. Gait and Station: Arises from chair without difficulty. Stance is normal. Gait demonstrates normal stride length with slight left straight leg without use of assistive device.  Reflexes: 1+ and symmetric. Toes downgoing.     NIHSS  0 Modified Rankin  0      ASSESSMENT: Adrian Rodgers is a 80 y.o. 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 Vascular risk factors include HTN, HLD, prior EtOH use, tobacco use,  cerebral aneurysm and advanced age.      PLAN:  TIA x2 R ACA stroke, cryptogenic Residual deficit: mild gait impairment - stable. Completed PT - advised continued HEP.  No indication for additional therapy at this time. Recommend gradual return to driving (okay to wait 2 months post stroke) as currently at baseline functioning Continue clopidogrel 75 mg daily  and atorvastatin 80 mg daily for secondary stroke prevention.   Loop recorder has not shown atrial fibrillation thus far Discussed secondary stroke prevention measures and importance of close PCP follow up for aggressive stroke risk factor management. I have gone over the pathophysiology of stroke, warning signs and symptoms, risk factors and their management in some detail with instructions to go to the closest emergency room for symptoms of concern. HTN: BP goal <130/90.  Stable on current regimen per PCP HLD: LDL goal <70. 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 Recent A1c 6.2.  routinely monitored by PCP Cerebral aneurysm: recent CTA - unchanged 5-7mm mid cervical left ICA. Plan to repeat imaging in 1 year for surveillance monitoring    Follow up in 6 months or call earlier if needed   CC:  [facility] provider: Dr. Leonie Man PCP: Cena Benton, MD    I spent 56 minutes of face-to-face and non-face-to-face time with patient and daughter.  This included previsit chart review including review of recent hospitalization, lab review, study review, electronic health record documentation, patient education regarding recent stroke including etiology as well as prior stroke, secondary stroke prevention measures and importance of managing stroke risk factors and answered all other questions to patient and daughters satisfaction   Frann Rider, AGNP-BC  [facility] [address] [place], [place] [address]  Phone [phone] Fax [phone] Note: This document was  prepared with digital dictation and possible smart phrase technology. Any transcriptional errors that result from this process are unintentional.

## 2021-05-14 NOTE — Patient Instructions (Addendum)
Continue clopidogrel 75 mg daily  and atorvastatin  for secondary stroke prevention  Continue to follow up with PCP regarding cholesterol,blood pressure and diabetes management  Maintain strict control of hypertension with blood pressure goal below 130/90, diabetes with hemoglobin A1c goal below 7.0% and cholesterol with LDL cholesterol (bad cholesterol) goal below 70 mg/dL.   Loop recorder will continue to be monitored for possible atrial fibrillation     Followup in the future with me in 6 months or call earlier if needed       Thank you for coming to see [place] at [facility]. I hope we have been able to provide you high quality care today.  You may receive a patient satisfaction survey over the next few weeks. We would appreciate your feedback and comments so that we may continue to improve ourselves and the health of our patients.

## 2021-06-11 ENCOUNTER — Ambulatory Visit (INDEPENDENT_AMBULATORY_CARE_PROVIDER_SITE_OTHER): Payer: Medicare Other

## 2021-06-11 DIAGNOSIS — I639 Cerebral infarction, unspecified: Secondary | ICD-10-CM

## 2021-06-12 LAB — CUP PACEART REMOTE DEVICE CHECK
Date Time Interrogation Session: 20221204103643
Implantable Pulse Generator Implant Date: 20211206

## 2021-06-20 NOTE — Progress Notes (Signed)
[facility] Summary Report / Loop Recorder 

## 2021-07-16 ENCOUNTER — Ambulatory Visit (INDEPENDENT_AMBULATORY_CARE_PROVIDER_SITE_OTHER): Payer: Medicare Other

## 2021-07-16 DIAGNOSIS — I639 Cerebral infarction, unspecified: Secondary | ICD-10-CM | POA: Diagnosis not present

## 2021-07-17 LAB — CUP PACEART REMOTE DEVICE CHECK
Date Time Interrogation Session: 20230115232511
Implantable Pulse Generator Implant Date: 20211206

## 2021-07-26 NOTE — Progress Notes (Signed)
[facility] Summary Report / Loop Recorder 

## 2021-08-20 ENCOUNTER — Ambulatory Visit (INDEPENDENT_AMBULATORY_CARE_PROVIDER_SITE_OTHER): Payer: Medicare Other

## 2021-08-20 DIAGNOSIS — I639 Cerebral infarction, unspecified: Secondary | ICD-10-CM

## 2021-08-20 LAB — CUP PACEART REMOTE DEVICE CHECK
Date Time Interrogation Session: 20230217232414
Implantable Pulse Generator Implant Date: 20211206

## 2021-08-24 NOTE — Progress Notes (Signed)
[facility] Summary Report / Loop Recorder 

## 2021-09-24 ENCOUNTER — Ambulatory Visit (INDEPENDENT_AMBULATORY_CARE_PROVIDER_SITE_OTHER): Payer: Medicare Other

## 2021-09-24 DIAGNOSIS — I639 Cerebral infarction, unspecified: Secondary | ICD-10-CM | POA: Diagnosis not present

## 2021-09-26 LAB — CUP PACEART REMOTE DEVICE CHECK
Date Time Interrogation Session: 20230326231553
Implantable Pulse Generator Implant Date: 20211206

## 2021-10-03 NOTE — Progress Notes (Signed)
[facility] Summary Report / Loop Recorder 

## 2021-10-29 ENCOUNTER — Ambulatory Visit (INDEPENDENT_AMBULATORY_CARE_PROVIDER_SITE_OTHER): Payer: Medicare Other

## 2021-10-29 DIAGNOSIS — I639 Cerebral infarction, unspecified: Secondary | ICD-10-CM | POA: Diagnosis not present

## 2021-10-29 LAB — CUP PACEART REMOTE DEVICE CHECK
Date Time Interrogation Session: 20230428231351
Implantable Pulse Generator Implant Date: 20211206

## 2021-11-12 NOTE — Progress Notes (Signed)
Carelink Summary Report / Loop [email or web address].c 

## 2021-11-13 ENCOUNTER — Ambulatory Visit: Payer: Medicare Other | Admitting: Adult Health

## 2021-12-03 ENCOUNTER — Ambulatory Visit (INDEPENDENT_AMBULATORY_CARE_PROVIDER_SITE_OTHER): Payer: Medicare Other

## 2021-12-03 DIAGNOSIS — I639 Cerebral infarction, unspecified: Secondary | ICD-10-CM

## 2021-12-04 ENCOUNTER — Encounter: Payer: Self-pay | Admitting: Adult Health

## 2021-12-04 ENCOUNTER — Ambulatory Visit: Payer: Medicare Other | Admitting: Adult Health

## 2021-12-04 VITALS — BP 138/90 | HR 88 | Ht 72.0 in | Wt 184.0 lb

## 2021-12-04 DIAGNOSIS — I671 Cerebral aneurysm, nonruptured: Secondary | ICD-10-CM

## 2021-12-04 DIAGNOSIS — I69398 Other sequelae of cerebral infarction: Secondary | ICD-10-CM

## 2021-12-04 DIAGNOSIS — I639 Cerebral infarction, unspecified: Secondary | ICD-10-CM | POA: Diagnosis not present

## 2021-12-04 DIAGNOSIS — G459 Transient cerebral ischemic attack, unspecified: Secondary | ICD-10-CM | POA: Diagnosis not present

## 2021-12-04 DIAGNOSIS — Z720 Tobacco use: Secondary | ICD-10-CM

## 2021-12-04 DIAGNOSIS — R269 Unspecified abnormalities of gait and mobility: Secondary | ICD-10-CM

## 2021-12-04 LAB — CUP PACEART REMOTE DEVICE CHECK
Date Time Interrogation Session: 20230531232526
Implantable Pulse Generator Implant Date: 20211206

## 2021-12-04 NOTE — Patient Instructions (Addendum)
Continue clopidogrel 75 mg daily  and atorvastatin for secondary stroke prevention  You will be called to set up home health therapy - if unable to do home health, please let me know and we can consider doing outpatient therapies   You will be called to schedule a cerebral angiogram around September for surveillance monitoring of your cerebral aneurysm  Continue to follow up with PCP regarding blood pressure and cholesterol management  Maintain strict control of hypertension with blood pressure goal below 130/90 and cholesterol with LDL cholesterol (bad cholesterol) goal below 70 mg/dL.   Signs of a Stroke? Follow the BEFAST method:  Balance Watch for a sudden loss of balance, trouble with coordination or vertigo Eyes Is there a sudden loss of vision in one or both eyes? Or double vision?  Face: Ask the person to smile. Does one side of the face droop or is it numb?  Arms: Ask the person to raise both arms. Does one arm drift downward? Is there weakness or numbness of a leg? Speech: Ask the person to repeat a simple phrase. Does the speech sound slurred/strange? Is the person confused ? Time: If you observe any of these signs, call 911.  If you need further assistance with quitting smoking, please do not hesitate to reach out to your PCP     Follow up in 6 months or call earlier if needed      Thank you for coming to see [place] at [facility]. I hope we have been able to provide you high quality care today.  You may receive a patient satisfaction survey over the next few weeks. We would appreciate your feedback and comments so that we may continue to improve ourselves and the health of our patients.

## 2021-12-04 NOTE — Progress Notes (Signed)
[facility] [address] [place] [address] [phone]       STROKE FOLLOW UP NOTE  [name] Date of Birth:  1941-04-24 Medical Record Number:  [id number]   Reason for Referral: stroke follow up    SUBJECTIVE:   CHIEF COMPLAINT:  Chief Complaint  Patient presents with   Follow-up    RM 3 with daughter here for 6 month f/u. Pt reports he continues to have left leg weakness but denies any falls. Other that his been doing fairly well.     HPI:   Update 12/04/2021 JM: Patient returns for stroke follow-up after prior visit 7 months ago.  He is accompanied by his daughter.  Reports episode 2 to 3 weeks ago of left leg weakness.  Reports sitting at a basketball game on the bleachers for over 1 hour and upon standing, his left leg gave out on him, he noted some weakness in LLE (like his leg would not support him) and numbness in left leg for about 1 hour prior to completely subsiding.  He had no other associated symptoms or deficits as daughter thoroughly assessed him. He does have gait impairment post stroke but also notes chronic low back pain affecting left side. Daughter notes gait hasn't been as steady but this is something that has been gradually occurring, denies acute onset.  She questions additional PT.  He is relatively sedentary with limited physical activity.  Use of cane for ambulation, denies any falls.  He otherwise denies any new stroke/TIA symptoms.  Continues to maintain ADLs independently as well as driving without difficulty.  Remains on Plavix and atorvastatin, denies side effects.  Blood pressure today 138/90. Does not routinely monitor at home.  Continued tobacco use 3-4 cigarettes per day, trying to decrease amount, currently on bupropion managed by PCP.  Loop recorder has not shown atrial fibrillation thus far.  Routinely followed by PCP.  No further concerns at this time      History provided for reference purposes  only Initial visit 05/14/2021 JM: Being seen today for hospital follow up accompanied by his daughter.  Previously seen via [facility] video visit on 06/13/2020 but lost to follow up. Doing well since discharge. Back to prior level of functioning. Denies new stroke/TIA symptoms. Lives alone - does ADLs independently. Family will help with some IADLs as needed. Was driving prior to recent TIA but has not yet returned to driving - daughter concerned regarding him returning back to driving due to concern of additional TIA. Per daughter, PCP advised that they will need neuro clearance prior to returning back to driving. Completed DAPT - remains on Plavix alone without side effects. Remains on atorvastatin 80mg  daily - denies side effects. Blood pressure today 142/88 - does not routinely monitor at home.  Loop recorder has not shown atrial fibrillation thus far.  No further concerns at this time  Stroke admission 03/25/2021 [name] is a 81 y.o. 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  Personally reviewed hospitalization pertinent progress notes, lab work and imaging.  Evaluated by Dr. 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  MRI negative acute infarct with small remote lacunar infarcts in bilateral cerebellar hemispheres.  CTA head/neck negative LVO.  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  EF 70 to 75%.  LDL 83.  A1c 6.2.  Recommended DAPT for 3 weeks and Plavix alone.  Loop recorder interrogated -no evidence of A. fib.  Therapy eval's without therapy needs and discharged home on 03/27/2021.  Return to ER same day for recurrent left-sided weakness and facial droop. EEG no evidence of epileptiform discharges and LTM EEG unremarkable. Eval by neuro with  likely recurrent TIA and repeat work up not indicated.  Discharged back home on 03/30/2021.         PERTINENT IMAGING   CT head No acute abnormality.  CTA head : No LVO CTA  neck :1. The bilateral common carotid and internal carotid arteries are patent within the neck without stenosis. Minimal soft plaque within the proximal right ICA. Tortuosity of both cervical internal carotid arteries. 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 2. Vertebral arteries patent within the neck without stenosis. Tortuosity of both vessels. 3. 9 mm region of ground-glass opacity in the left lung apex, stable as compared to the CTA head/neck of 05/13/2020 MRI  no acute infarct or hemorrhage. Small remote lacunar infarcts in bilateral cerebellar hemispheres. 2D Echo EF 70-75% LDL 83 HgbA1c 6.2   ROS:   14 system review of systems performed and negative with exception of those listed in HPI  PMH:  Past Medical History:  Diagnosis Date   High cholesterol    Hypertension    Stroke ([place]) 05/01/2020   Tobacco dependence     PSH:  Past Surgical History:  Procedure Laterality Date   implantable loop recorder placement  06/05/2020   Medtronic Reveal [place] model [id number]) implantable loop recorder     Social History:  Social History   Socioeconomic History   Marital status: Divorced    Spouse name: Not on file   Number of children: Not on file   Years of education: Not on file   Highest education level: Not on file  Occupational History   Occupation: retired  Tobacco Use   Smoking status: Every Day    Packs/day: 1.00    Years: 20.00    Pack years: 20.00    Types: Cigarettes   Smokeless tobacco: Never  Vaping Use   Vaping Use: Never used  Substance and Sexual Activity   Alcohol use: Yes    Alcohol/week: 1.0 standard drink    Types: 1 Cans of beer per week     Comment: remote h/o heavy use   Drug use: Never   Sexual activity: Not on file  Other Topics Concern   Not on file  Social History Narrative   Not on file   Social Determinants of Health   Financial Resource Strain: Not on file  Food Insecurity: Not on file  Transportation Needs: Not on file  Physical Activity: Not on file  Stress: Not on file  Social Connections: Not on file  Intimate Partner Violence: Not on file    Family History:  Family History  Family history unknown: Yes    Medications:   Current Outpatient Medications on File Prior to Visit  Medication Sig Dispense Refill   clopidogrel (PLAVIX) 75 MG tablet Take 1 tablet by mouth daily.     acetaminophen (TYLENOL) 500 MG tablet Take 500-1,000 mg by mouth every 8 (eight) hours as needed for mild pain (or headaches).      albuterol (PROVENTIL HFA;VENTOLIN HFA) 108 (90 Base)  MCG/ACT inhaler Inhale 2 puffs into the lungs every 6 (six) hours as needed for wheezing or shortness of breath. 1 Inhaler 2   atorvastatin (LIPITOR) 80 MG tablet Take 80 mg by mouth daily.     buPROPion (WELLBUTRIN XL) 150 MG 24 hr tablet Take 150 mg by mouth daily.     losartan (COZAAR) 50 MG tablet Take 50 mg by mouth daily.     Multiple Vitamins-Minerals (CENTRUM SILVER 50+MEN) TABS Take 1 tablet by mouth daily with breakfast.     nicotine (NICODERM CQ - DOSED IN MG/24 HOURS) 14 mg/24hr patch Place 14 mg onto the skin daily.     No current facility-administered medications on file prior to visit.    Allergies:  No Known Allergies    OBJECTIVE:  Physical Exam  Vitals:   12/04/21 1405  BP: 138/90  Pulse: 88  Weight: 184 lb (83.5 kg)  Height: 6' (1.829 m)    Body mass index is 24.95 kg/m. No results found.   General: well developed, well nourished, very pleasant elderly African-American male, seated, in no evident distress Head: head normocephalic and atraumatic.   Neck: supple with no carotid or supraclavicular  bruits Cardiovascular: regular rate and rhythm, no murmurs Musculoskeletal: no deformity Skin:  no rash/petichiae Vascular:  Normal pulses all extremities   Neurologic Exam Mental Status: Awake and fully alert.  Fluent speech and language.  Oriented to place and time. Recent and remote memory intact. Attention span, concentration and fund of knowledge appropriate. Mood and affect appropriate.  Cranial Nerves: Pupils equal, briskly reactive to light. Extraocular movements full without nystagmus. Visual fields full to confrontation. Hearing intact. Facial sensation intact. Face, tongue, palate moves normally and symmetrically.  Motor: Normal bulk and tone. Normal strength in all tested extremity muscles Sensory.: intact to touch , pinprick , position and vibratory sensation.  Coordination: Rapid alternating movements normal in all extremities. Finger-to-nose and heel-to-shin performed accurately bilaterally. Gait and Station: Arises from chair without difficulty. Stance is slightly hunched. Gait demonstrates slightly decreased stride length and step height of LLE which was seen on prior visit exam, mild unsteadiness, use of cane.  Tandem walking heel toe not attempted Reflexes: 1+ and symmetric. Toes downgoing.         ASSESSMENT: Adrian Rodgers is a 81 y.o. 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 Vascular risk factors include HTN, HLD, prior EtOH use, tobacco use, diffuse intracranial stenosis, cerebral aneurysm and advanced age.  Reports transient episode of left leg weakness mid-May without any other associated symptoms/deficits     PLAN:  Transient left leg weakness: Discussed possibility of TIA vs stroke although low suspicion due to duration of deficits, stable neurological exam today and no other associated symptoms.  He declines interest in obtaining  MR brain which I feel is reasonable as he is back to baseline, episode occurred 2 to 3 weeks ago and current treatment plan would not change. Also question possibility of chronic lower back pain being a contributing factor.  Referral placed for home health PT which is appropriate as patient cannot ambulate long distance nor drives long distance without assistance.  Encouraged follow-up with PCP for further back evaluation if indicated.  Discussed importance of proceeding to ED immediately with any acute onset stroke/TIA symptoms TIA x2 R ACA stroke, cryptogenic Continue clopidogrel 75 mg daily  and atorvastatin 80 mg daily for  secondary stroke prevention.   Loop recorder has not shown atrial fibrillation thus far -monitored by cardiology Discussed secondary stroke prevention measures and importance of close PCP follow up for aggressive stroke risk factor management including BP goal<130/90, HLD with LDL goal<70 and pre-DM with A1c.<7 .  Stroke labs 08/2021: LDL 55, A1c 6.3 Discussed importance of complete tobacco cessation as continued use greatly increases risk of additional strokes as well as increase risk of aneurysm rupture.  He verbalizes understanding and is trying to decrease daily amount Cerebral aneurysm:  Repeat CTA around 03/2022 for surveillance monitoring CTA 03/2021- unchanged 5-57mm mid cervical left ICA. Plan to repeat imaging in 1 year for surveillance monitoring CTA 05/2020 left carotid 5 to 6 mm widemouth aneurysm or pseudoaneurysm     Follow up in 6 months or call earlier if needed   CC:  PCP: Cena Benton, MD    I spent 37 minutes of face-to-face and non-face-to-face time with patient and daughter.  This included previsit chart review, lab review, study review, electronic health record documentation, patient and daughter education regarding prior stroke with residual deficits, transient LLE weakness and possible etiologies, secondary stroke prevention measures and  importance of managing stroke risk factors and answered all other questions to patient and daughters satisfaction   Frann Rider, [facility]  [facility] [address] [place], [place] [address]  Phone [phone] Fax [phone] Note: This document was prepared with digital dictation and possible smart phrase technology. Any transcriptional errors that result from this process are unintentional.

## 2021-12-05 ENCOUNTER — Telehealth: Payer: Self-pay | Admitting: Adult Health

## 2021-12-05 IMAGING — MR MR HEAD W/O CM
12 of 13 series · 44 of 48 positions shown · non-contrast
Comparison: CT/CTA head neck 03/25/2021

CLINICAL DATA: TIA

EXAM:
MRI HEAD WITHOUT CONTRAST
TECHNIQUE: Multiplanar, multiecho pulse sequences of the brain and surrounding
structures were obtained without intravenous contrast.

[Series 5: DWI · axial · 3.0mm · 0.88mm/px · z∈[-119,+23]mm · 7 of 100 slices shown (1 of 4)]
[im 1/100]
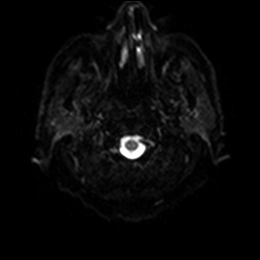
[im 17/100]
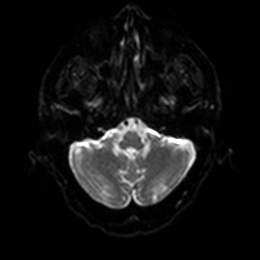
[im 34/100]
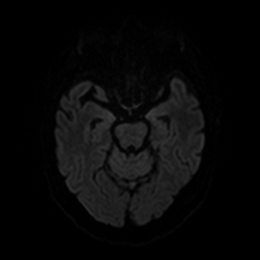
[im 50/100]
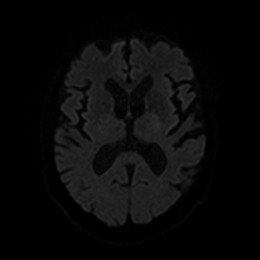
[im 67/100]
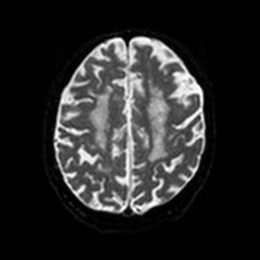
[im 83/100]
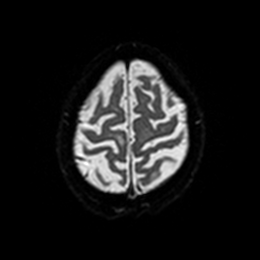
[im 100/100]
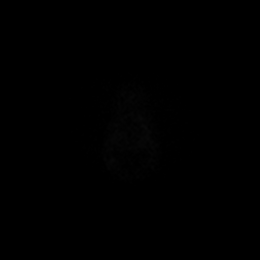

[Series 6: DWI · axial · 3.0mm · 0.88mm/px · z∈[-119,+23]mm · 4 of 50 slices shown (2 of 4)]
[im 1/50]
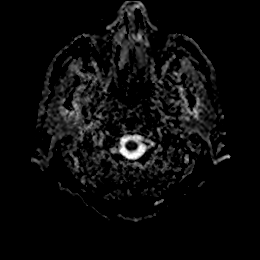
[im 17/50]
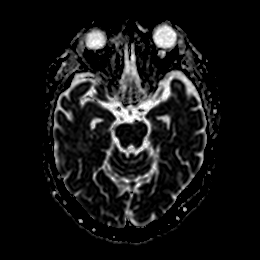
[im 33/50]
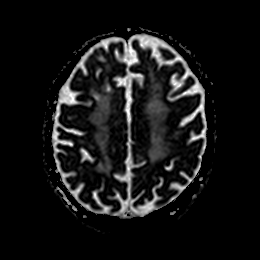
[im 50/50]
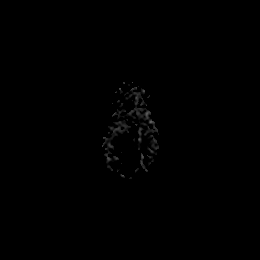

[Series 7: DWI · coronal · 4.0mm · 0.88mm/px · 5 of 64 slices shown (3 of 4)]
[im 1/64]
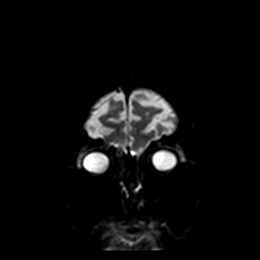
[im 16/64]
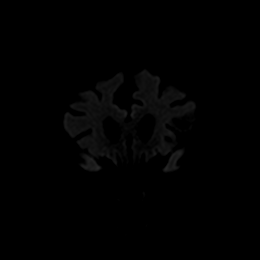
[im 32/64]
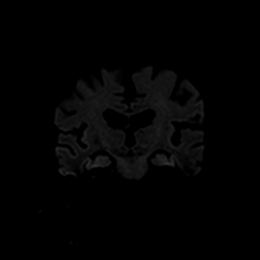
[im 48/64]
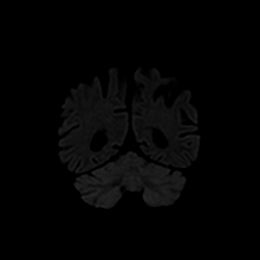
[im 64/64]
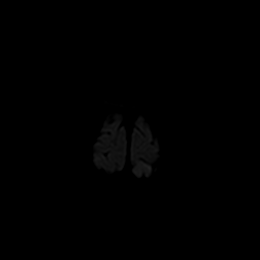

[Series 8: DWI · coronal · 4.0mm · 0.88mm/px · 2 of 32 slices shown (4 of 4)]
[im 1/32]
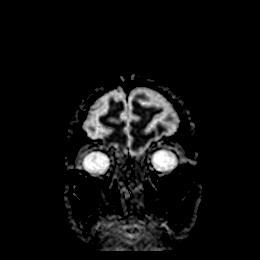
[im 32/32]
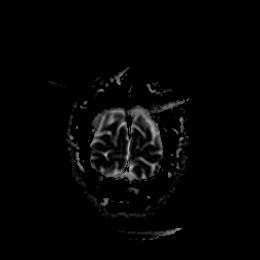

[Series 9: T1 · sagittal · 5.0mm · 0.75mm/px · 2 of 23 slices shown]
[im 1/23]
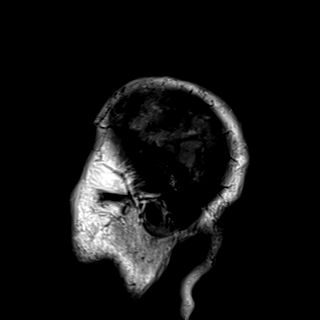
[im 23/23]
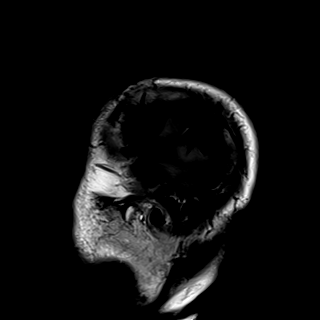

[Series 10: T2 · axial · 5.0mm · 0.72mm/px · z∈[-118,+22]mm · 2 of 25 slices shown (1 of 2)]
[im 1/25]
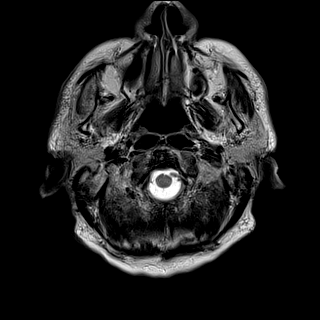
[im 25/25]
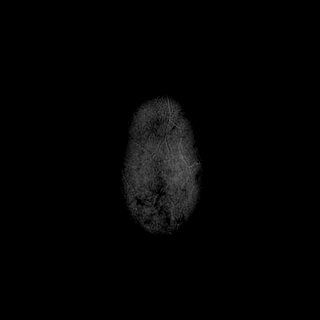

[Series 11: FLAIR · axial · 5.0mm · 0.45mm/px · z∈[-118,+22]mm · 2 of 25 slices shown]
[im 1/25]
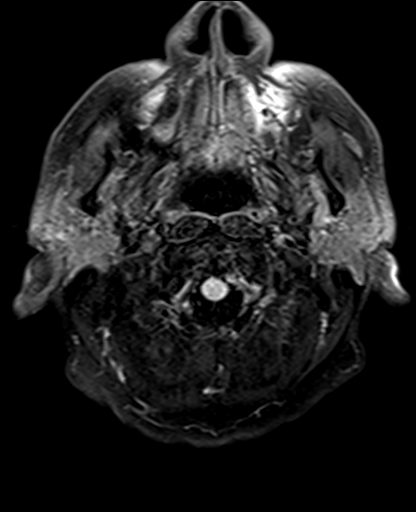
[im 25/25]
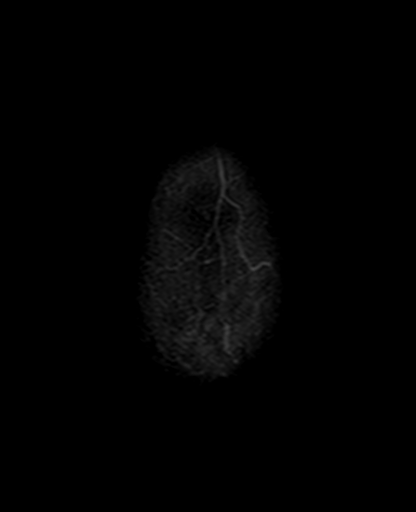

[Series 12: mag_images · axial · 3.0mm · 0.90mm/px · z∈[-134,+38]mm · 5 of 60 slices shown]
[im 1/60]
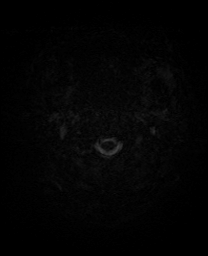
[im 15/60]
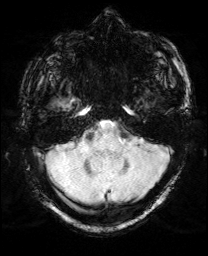
[im 30/60]
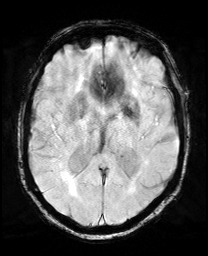
[im 45/60]
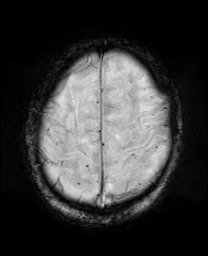
[im 60/60]
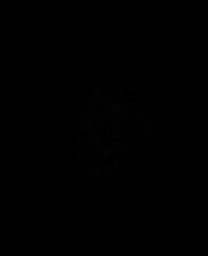

[Series 13: pha_images · axial · 3.0mm · 0.90mm/px · z∈[-134,+32]mm · 4 of 58 slices shown]
[im 1/58]
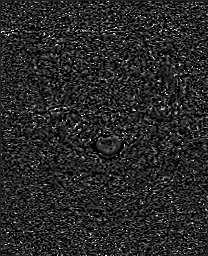
[im 20/58]
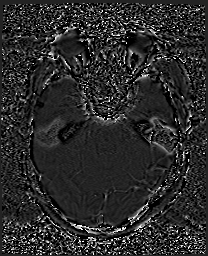
[im 39/58]
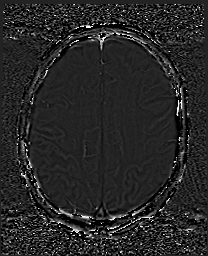
[im 58/58]
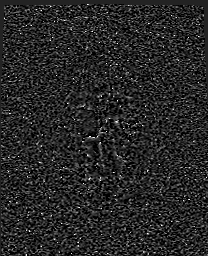

[Series 14: swi_images · axial · 3.0mm · 0.90mm/px · z∈[-134,+38]mm · 5 of 60 slices shown]
[im 1/60]
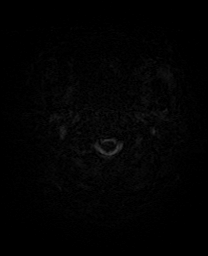
[im 15/60]
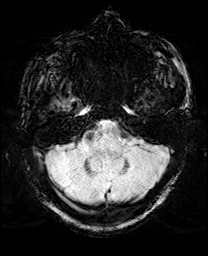
[im 30/60]
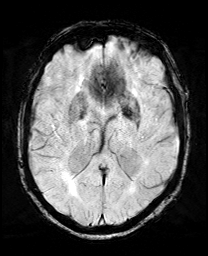
[im 45/60]
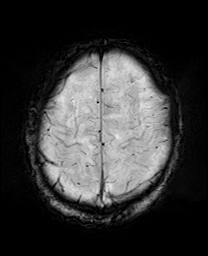
[im 60/60]
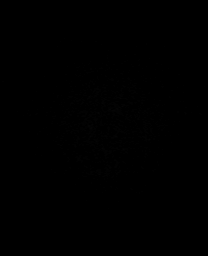

[Series 15: mip_images(sw) · axial · 24.0mm · 0.90mm/px · z∈[-124,+28]mm · 4 of 53 slices shown]
[im 1/53]
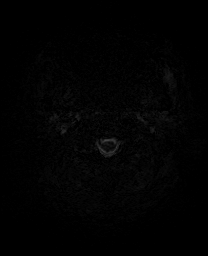
[im 18/53]
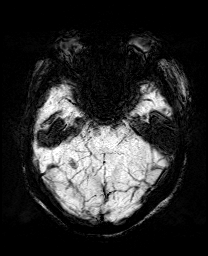
[im 35/53]
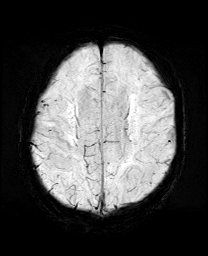
[im 53/53]
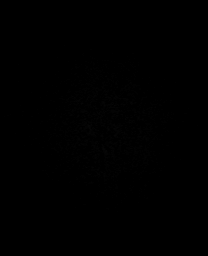

[Series 17: T2 · coronal · 5.0mm · 0.34mm/px · 2 of 29 slices shown (2 of 2)]
[im 1/29]
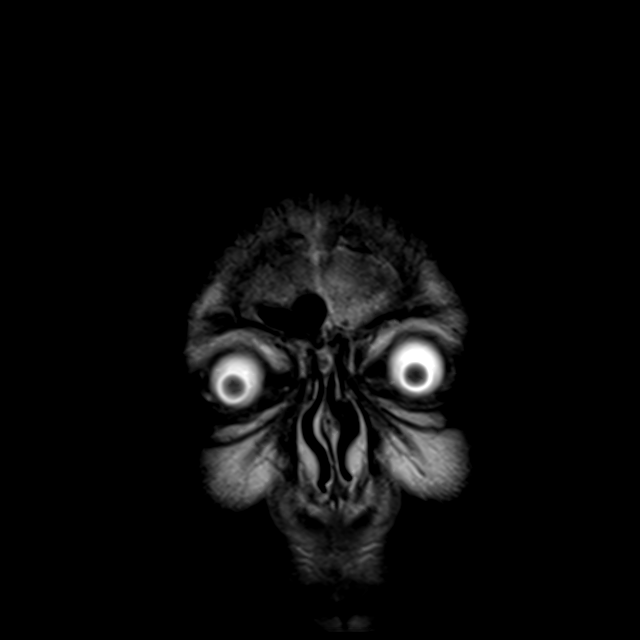
[im 29/29]
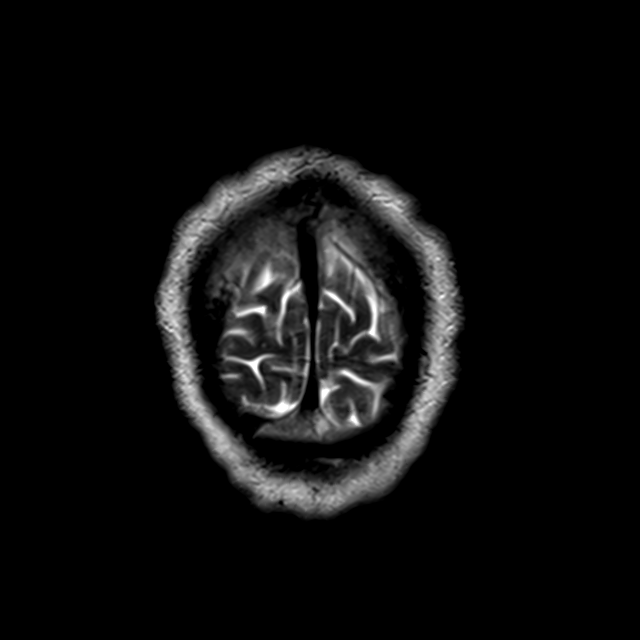

[44 of 48 positions shown; findings below may reference images not displayed]

FINDINGS: Brain: There is no evidence of acute intracranial hemorrhage,
extra-axial fluid collection, or acute infarct.

There are small remote lacunar infarcts in the bilateral cerebellar
hemispheres. Additional remote infarcts are seen in the right aspect
of the corpus callosum and parasagittal frontal lobe in the ACA
territory (10-16, 10-22).

There is moderate global parenchymal volume loss with commensurate
enlargement of the ventricular system. There is extensive confluent
FLAIR signal abnormality throughout the supratentorial white matter
likely reflecting sequela of advanced chronic white matter
microangiopathy.

There is no mass lesion.  There is no midline shift.

Vascular: Normal flow voids.

Skull and upper cervical spine: Normal marrow signal.

Sinuses/Orbits: The paranasal sinuses are clear. The globes and
orbits are unremarkable.

Other: None.
IMPRESSION: 1. No acute intracranial hemorrhage or infarct.
2. Moderate parenchymal volume loss and advanced chronic white
matter microangiopathy.
3. Small remote lacunar infarcts in the bilateral cerebellar
hemispheres and remote infarcts in the right ACA distribution.

## 2021-12-05 IMAGING — CT CT HEAD W/O CM
3 series · 14 of 47 positions shown, 16 images · non-contrast
Comparison: 03/27/2021 at 9 a.m.

CLINICAL DATA: Left-sided weakness for 1 day

EXAM:
CT HEAD WITHOUT CONTRAST
TECHNIQUE: Contiguous axial images were obtained from the base of the skull
through the vertex without intravenous contrast.

[Series 2: head wo · axial · 0.45mm/px · z∈[+1237,+1377]mm · 8 of 34 slices shown, 10 images]
[im 3/34  brain]
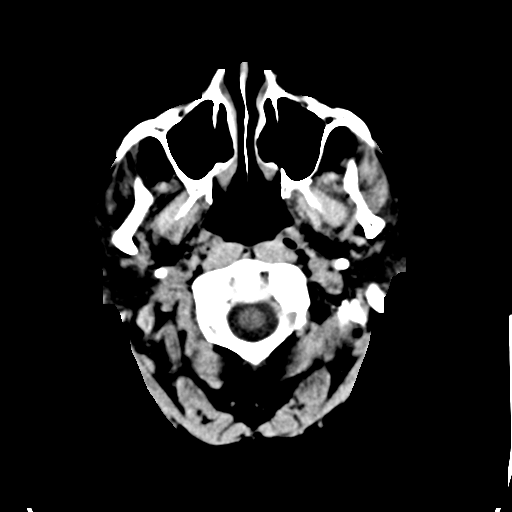
[im 3/34  bone]
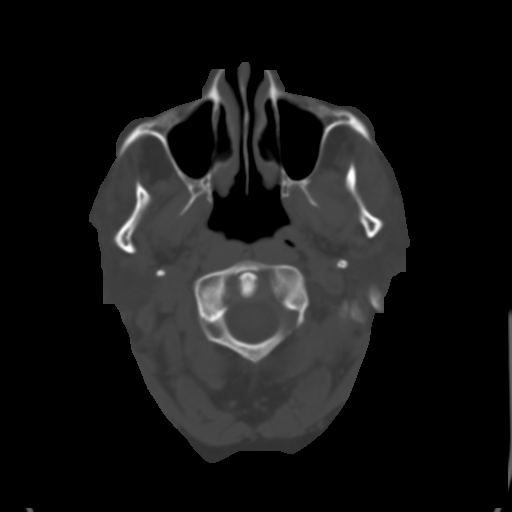
[im 7/34  brain]
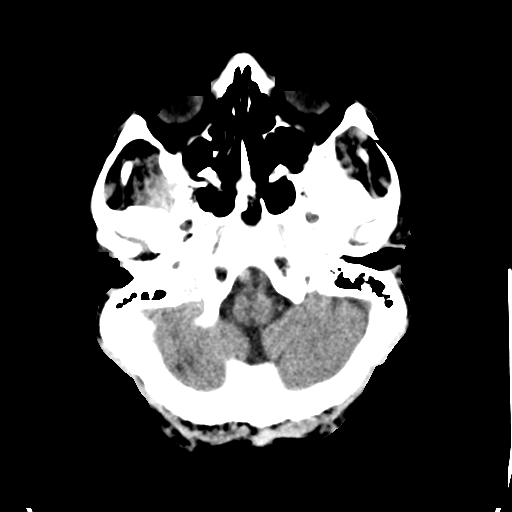
[im 11/34  brain]
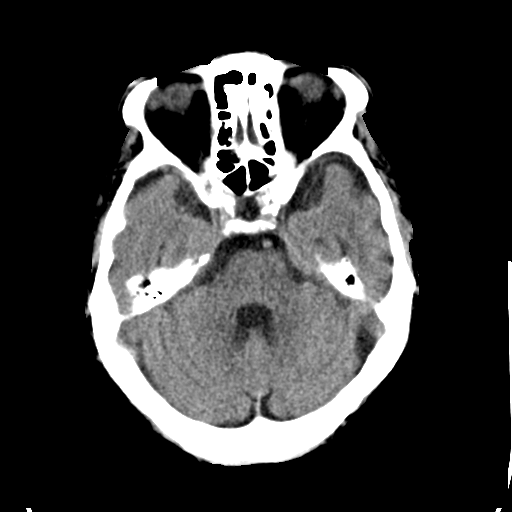
[im 15/34  brain]
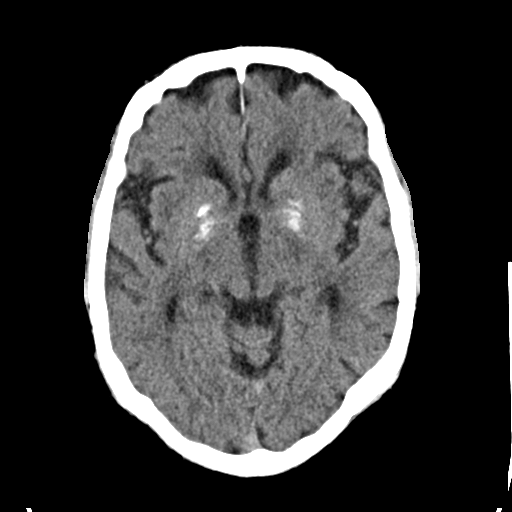
[im 19/34  brain]
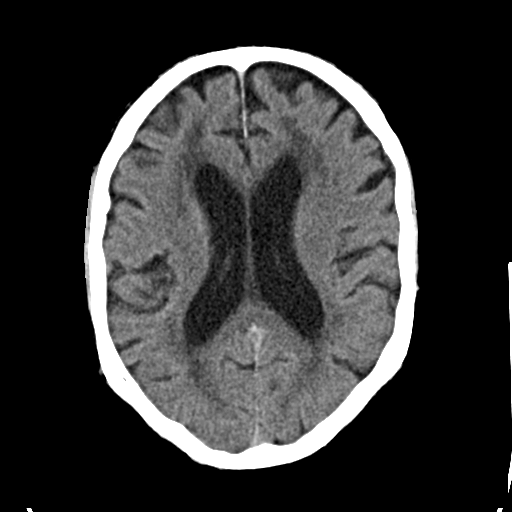
[im 19/34  bone]
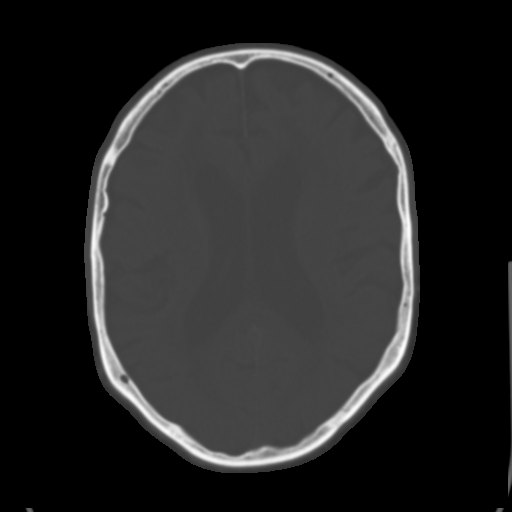
[im 23/34  brain]
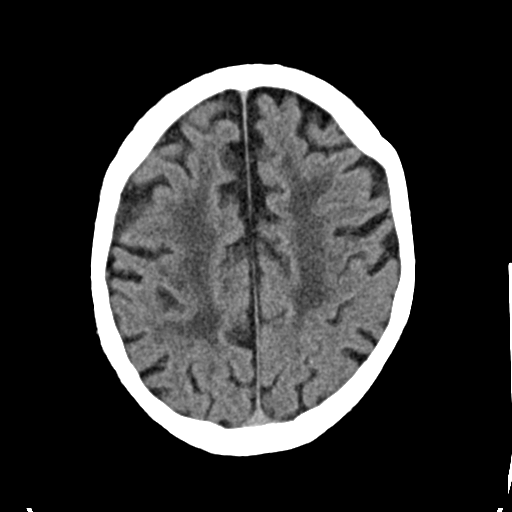
[im 27/34  brain]
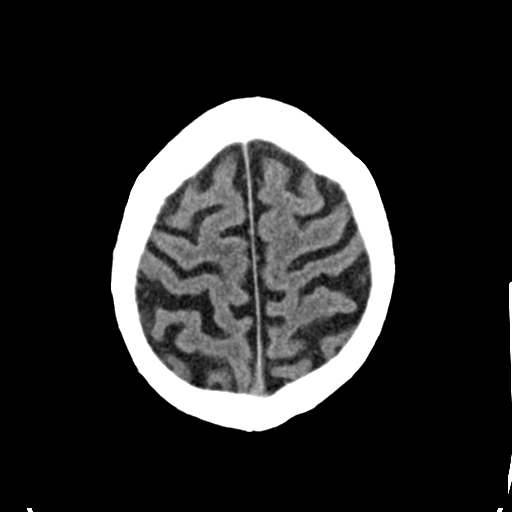
[im 31/34  brain]
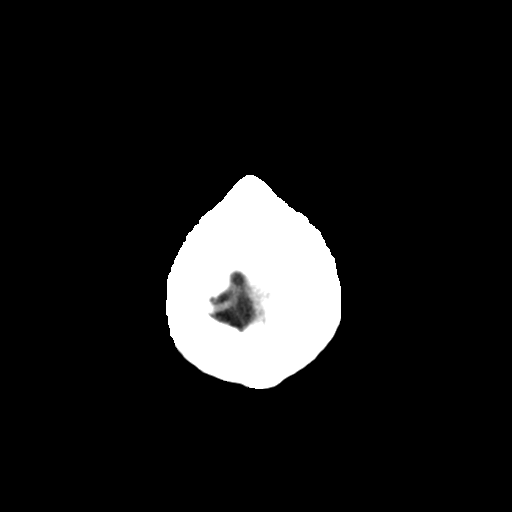

[Series 4: coronal soft · coronal · 0.33mm/px · 3 of 74 slices shown]
[im 25/74  brain]
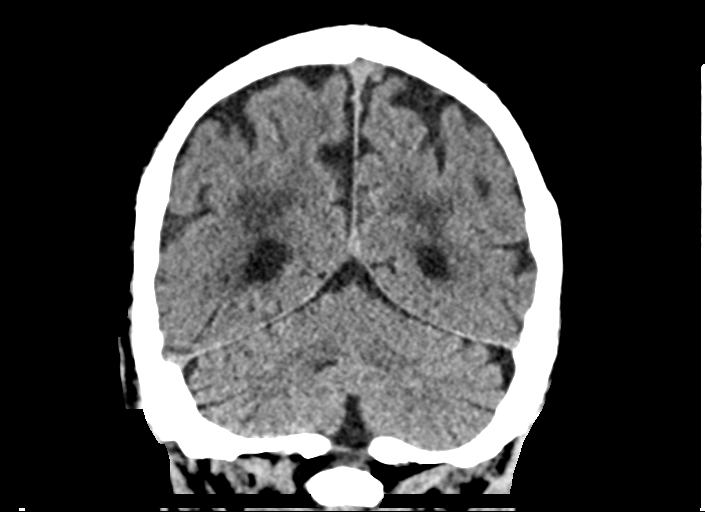
[im 33/74  brain]
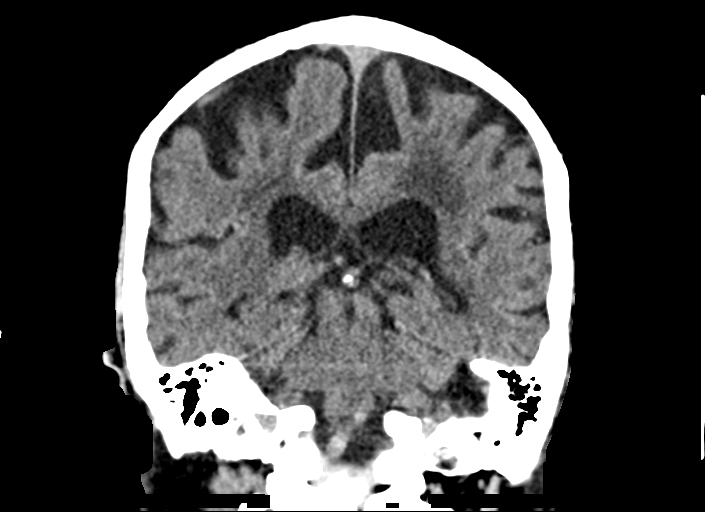
[im 41/74  brain]
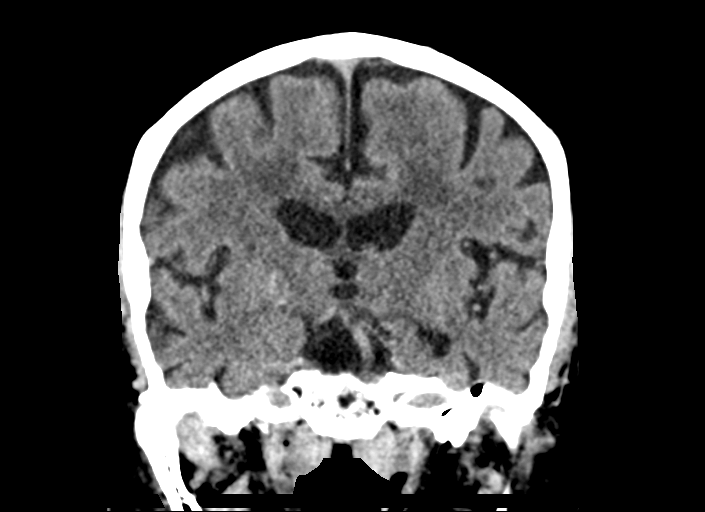

[Series 5: sag soft · sagittal · 0.33mm/px · 3 of 67 slices shown]
[im 23/67  brain]
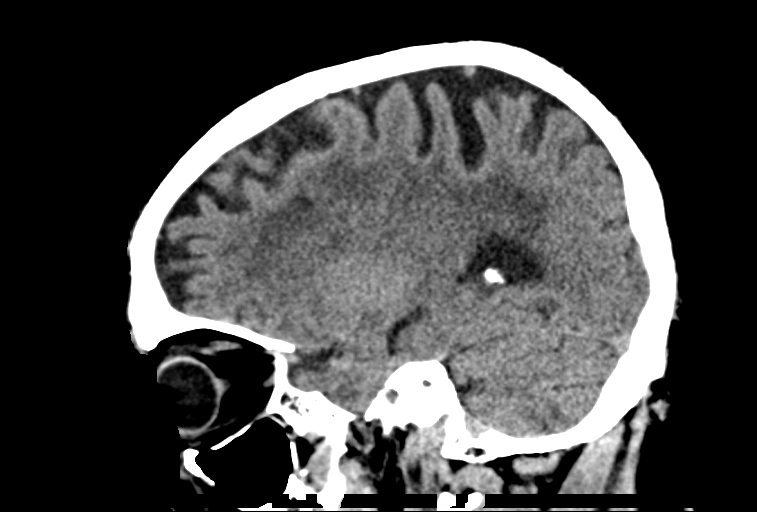
[im 34/67  brain]
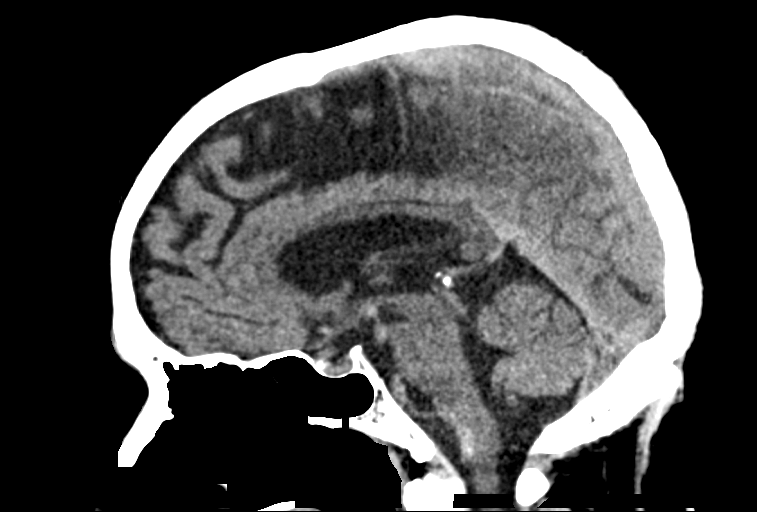
[im 45/67  brain]
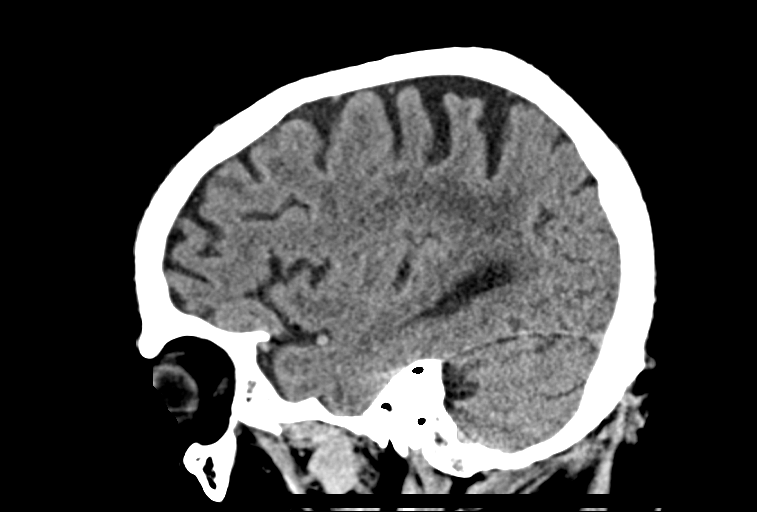

[14 of 47 positions shown; findings below may reference images not displayed]

FINDINGS: Brain: Confluent hypodensities within periventricular white matter
are stable consistent with chronic small vessel ischemic change. No
signs of acute infarct or hemorrhage. The lateral ventricles and
midline structures are stable, with dense bilateral basal ganglia
calcifications again noted. No acute extra-axial fluid collections.
No mass effect.

Vascular: No hyperdense vessel or unexpected calcification.

Skull: Normal. Negative for fracture or focal lesion.

Sinuses/Orbits: No acute finding.

Other: None.
IMPRESSION: 1. No acute intracranial process.
2. Stable chronic small-vessel ischemic changes. No change since MRI
performed earlier today.

## 2021-12-05 NOTE — Telephone Encounter (Signed)
From Clifton Custard with [facility]:  [facility]win Nora said they can accept Mr Derflinger

## 2021-12-18 NOTE — Progress Notes (Signed)
[facility] Summary Report / Loop Recorder 

## 2022-01-05 LAB — CUP PACEART REMOTE DEVICE CHECK
Date Time Interrogation Session: 20230703232617
Implantable Pulse Generator Implant Date: 20211206

## 2022-01-07 ENCOUNTER — Ambulatory Visit (INDEPENDENT_AMBULATORY_CARE_PROVIDER_SITE_OTHER): Payer: Medicare Other

## 2022-01-07 DIAGNOSIS — I639 Cerebral infarction, unspecified: Secondary | ICD-10-CM | POA: Diagnosis not present

## 2022-01-10 ENCOUNTER — Telehealth: Payer: Self-pay | Admitting: Adult Health

## 2022-01-10 NOTE — Telephone Encounter (Signed)
Noted  

## 2022-01-10 NOTE — Telephone Encounter (Signed)
Victorino Dike, PT @ [facility] called to report that because of insurance authorization issues they had to r/s for this week, if there are questions for Victorino Dike her call back # is [phone]

## 2022-01-28 NOTE — Progress Notes (Signed)
[facility] Summary Report / Loop Recorder 

## 2022-02-07 LAB — CUP PACEART REMOTE DEVICE CHECK
Date Time Interrogation Session: 20230805232927
Implantable Pulse Generator Implant Date: 20211206

## 2022-02-11 ENCOUNTER — Ambulatory Visit: Payer: Medicare Other

## 2022-02-27 ENCOUNTER — Other Ambulatory Visit: Payer: Self-pay | Admitting: Adult Health

## 2022-02-27 ENCOUNTER — Telehealth: Payer: Self-pay | Admitting: Adult Health

## 2022-02-27 DIAGNOSIS — I671 Cerebral aneurysm, nonruptured: Secondary | ICD-10-CM

## 2022-02-27 NOTE — Telephone Encounter (Signed)
[employer] medicare NPR sent to GI 

## 2022-03-08 ENCOUNTER — Telehealth: Payer: Self-pay

## 2022-03-08 NOTE — Telephone Encounter (Addendum)
[employer] Alert~  LINQ alert received.  1 new tachy event 8/28 @ 11:10, duration 30sec, HR 214. EGM shows narrow regular rhythm Route to triage for duration/rate.  Patient called and denies any symptoms or complaints during this day. States he has been doing well and compliant with meds on file. Advised patient I will forward to MD for review and call if any changes are warranted.   Please call [name] FIRST if any changes are needed. [phone].

## 2022-03-18 ENCOUNTER — Ambulatory Visit (INDEPENDENT_AMBULATORY_CARE_PROVIDER_SITE_OTHER): Payer: Medicare Other

## 2022-03-18 DIAGNOSIS — I639 Cerebral infarction, unspecified: Secondary | ICD-10-CM

## 2022-03-18 NOTE — Telephone Encounter (Signed)
Left detail message (DPR) regarding Dr. Claudie Revering recommendation.  Advised to call back with questions if needed.   Appears to be an SVT--likely AT. It doesn't look classic for AF. I would recommend we continue monitoring the patient's rhythm using the loop recorder.   Otila Kluver, can you update the patient.   Thanks,  Lars Mage

## 2022-03-19 LAB — CUP PACEART REMOTE DEVICE CHECK
Date Time Interrogation Session: 20230917231504
Implantable Pulse Generator Implant Date: 20211206

## 2022-03-25 ENCOUNTER — Encounter (HOSPITAL_BASED_OUTPATIENT_CLINIC_OR_DEPARTMENT_OTHER): Payer: Self-pay

## 2022-03-25 ENCOUNTER — Emergency Department (HOSPITAL_BASED_OUTPATIENT_CLINIC_OR_DEPARTMENT_OTHER): Payer: Medicare Other

## 2022-03-25 ENCOUNTER — Other Ambulatory Visit: Payer: Self-pay

## 2022-03-25 ENCOUNTER — Inpatient Hospital Stay (HOSPITAL_BASED_OUTPATIENT_CLINIC_OR_DEPARTMENT_OTHER)
Admission: EM | Admit: 2022-03-25 | Discharge: 2022-03-30 | DRG: 177 | Disposition: A | Discharge status: Home Health | Payer: Medicare Other | Attending: Internal Medicine | Admitting: Internal Medicine

## 2022-03-25 DIAGNOSIS — U071 COVID-19: Secondary | ICD-10-CM | POA: Diagnosis not present

## 2022-03-25 DIAGNOSIS — J9601 Acute respiratory failure with hypoxia: Secondary | ICD-10-CM | POA: Diagnosis present

## 2022-03-25 DIAGNOSIS — I69354 Hemiplegia and hemiparesis following cerebral infarction affecting left non-dominant side: Secondary | ICD-10-CM | POA: Diagnosis not present

## 2022-03-25 DIAGNOSIS — J438 Other emphysema: Secondary | ICD-10-CM | POA: Diagnosis present

## 2022-03-25 DIAGNOSIS — I10 Essential (primary) hypertension: Secondary | ICD-10-CM

## 2022-03-25 DIAGNOSIS — F39 Unspecified mood [affective] disorder: Secondary | ICD-10-CM | POA: Diagnosis not present

## 2022-03-25 DIAGNOSIS — Z8673 Personal history of transient ischemic attack (TIA), and cerebral infarction without residual deficits: Secondary | ICD-10-CM

## 2022-03-25 DIAGNOSIS — Z79899 Other long term (current) drug therapy: Secondary | ICD-10-CM

## 2022-03-25 DIAGNOSIS — Z7902 Long term (current) use of antithrombotics/antiplatelets: Secondary | ICD-10-CM | POA: Diagnosis not present

## 2022-03-25 DIAGNOSIS — E7800 Pure hypercholesterolemia, unspecified: Secondary | ICD-10-CM | POA: Diagnosis present

## 2022-03-25 DIAGNOSIS — J449 Chronic obstructive pulmonary disease, unspecified: Secondary | ICD-10-CM | POA: Diagnosis present

## 2022-03-25 DIAGNOSIS — Z720 Tobacco use: Secondary | ICD-10-CM

## 2022-03-25 DIAGNOSIS — E785 Hyperlipidemia, unspecified: Secondary | ICD-10-CM

## 2022-03-25 DIAGNOSIS — F17210 Nicotine dependence, cigarettes, uncomplicated: Secondary | ICD-10-CM | POA: Diagnosis present

## 2022-03-25 DIAGNOSIS — J208 Acute bronchitis due to other specified organisms: Secondary | ICD-10-CM | POA: Diagnosis not present

## 2022-03-25 DIAGNOSIS — J9600 Acute respiratory failure, unspecified whether with hypoxia or hypercapnia: Secondary | ICD-10-CM

## 2022-03-25 DIAGNOSIS — J441 Chronic obstructive pulmonary disease with (acute) exacerbation: Secondary | ICD-10-CM | POA: Diagnosis present

## 2022-03-25 DIAGNOSIS — R531 Weakness: Secondary | ICD-10-CM

## 2022-03-25 LAB — HEPATIC FUNCTION PANEL
ALT: 26 U/L (ref 0–44)
AST: 17 U/L (ref 15–41)
Albumin: 3.8 g/dL (ref 3.5–5.0)
Alkaline Phosphatase: 77 U/L (ref 38–126)
Bilirubin, Direct: <0.1 mg/dL (ref 0.0–0.2)
Total Bilirubin: 0.9 mg/dL (ref 0.3–1.2)
Total Protein: 7.1 g/dL (ref 6.5–8.1)

## 2022-03-25 LAB — CBC WITH DIFFERENTIAL/PLATELET
Abs Immature Granulocytes: 0 10*3/uL (ref 0.00–0.07)
Basophils Absolute: 0 10*3/uL (ref 0.0–0.1)
Basophils Relative: 0 %
Eosinophils Absolute: 0 10*3/uL (ref 0.0–0.5)
Eosinophils Relative: 1 %
HCT: 42.2 % (ref 39.0–52.0)
Hemoglobin: 13.8 g/dL (ref 13.0–17.0)
Immature Granulocytes: 0 %
Lymphocytes Relative: 22 %
Lymphs Abs: 1.1 10*3/uL (ref 0.7–4.0)
MCH: 30.1 pg (ref 26.0–34.0)
MCHC: 32.7 g/dL (ref 30.0–36.0)
MCV: 92.1 fL (ref 80.0–100.0)
Monocytes Absolute: 0.7 10*3/uL (ref 0.1–1.0)
Monocytes Relative: 14 %
Neutro Abs: 3.2 10*3/uL (ref 1.7–7.7)
Neutrophils Relative %: 63 %
Platelets: 357 10*3/uL (ref 150–400)
RBC: 4.58 MIL/uL (ref 4.22–5.81)
RDW: 14.5 % (ref 11.5–15.5)
WBC: 5 10*3/uL (ref 4.0–10.5)
nRBC: 0 % (ref 0.0–0.2)

## 2022-03-25 LAB — PROCALCITONIN: Procalcitonin: <0.1 ng/mL

## 2022-03-25 LAB — BASIC METABOLIC PANEL
Anion gap: 10 (ref 5–15)
BUN: 14 mg/dL (ref 8–23)
CO2: 25 mmol/L (ref 22–32)
Calcium: 8.6 mg/dL — ABNORMAL LOW (ref 8.9–10.3)
Chloride: 100 mmol/L (ref 98–111)
Creatinine, Ser: 1.29 mg/dL — ABNORMAL HIGH (ref 0.61–1.24)
GFR, Estimated: 56 mL/min — ABNORMAL LOW (ref >60)
Glucose, Bld: 93 mg/dL (ref 70–99)
Potassium: 3.7 mmol/L (ref 3.5–5.1)
Sodium: 135 mmol/L (ref 135–145)

## 2022-03-25 LAB — SARS CORONAVIRUS 2 BY RT PCR: SARS Coronavirus 2 by RT PCR: POSITIVE — AB

## 2022-03-25 LAB — D-DIMER, QUANTITATIVE: D-Dimer, Quant: 0.45 ug/mL-FEU (ref 0.00–0.50)

## 2022-03-25 LAB — LACTATE DEHYDROGENASE: LDH: 159 U/L (ref 98–192)

## 2022-03-25 LAB — C-REACTIVE PROTEIN: CRP: 1.1 mg/dL — ABNORMAL HIGH (ref <1.0)

## 2022-03-25 MED ORDER — LOSARTAN POTASSIUM 50 MG PO TABS
50.0000 mg | ORAL_TABLET | Freq: Every day | ORAL | Status: DC
  Administered 2022-03-26 – 2022-03-28 (×3): 50 mg via ORAL
  Filled 2022-03-25 (×3): qty 1

## 2022-03-25 MED ORDER — GUAIFENESIN-DM 100-10 MG/5ML PO SYRP: 10.0000 mL | ORAL_SOLUTION | ORAL | Status: DC | PRN

## 2022-03-25 MED ORDER — LACTATED RINGERS IV BOLUS
1000.0000 mL | Freq: Once | INTRAVENOUS | Status: AC
  Administered 2022-03-25: 1000 mL via INTRAVENOUS

## 2022-03-25 MED ORDER — ENOXAPARIN SODIUM 40 MG/0.4ML IJ SOSY
40.0000 mg | PREFILLED_SYRINGE | INTRAMUSCULAR | Status: DC
  Administered 2022-03-26 – 2022-03-29 (×5): 40 mg via SUBCUTANEOUS
  Filled 2022-03-25 (×5): qty 0.4

## 2022-03-25 MED ORDER — IPRATROPIUM-ALBUTEROL 20-100 MCG/ACT IN AERS: 1.0000 | INHALATION_SPRAY | Freq: Four times a day (QID) | RESPIRATORY_TRACT | Status: DC | PRN

## 2022-03-25 MED ORDER — ALBUTEROL SULFATE (2.5 MG/3ML) 0.083% IN NEBU: 2.5000 mg | INHALATION_SOLUTION | Freq: Once | RESPIRATORY_TRACT | Status: DC

## 2022-03-25 MED ORDER — MOLNUPIRAVIR EUA 200MG CAPSULE
4.0000 | ORAL_CAPSULE | Freq: Two times a day (BID) | ORAL | Status: DC
  Administered 2022-03-26 (×3): 800 mg via ORAL
  Filled 2022-03-25 (×2): qty 4

## 2022-03-25 MED ORDER — ACETAMINOPHEN 325 MG PO TABS
650.0000 mg | ORAL_TABLET | Freq: Once | ORAL | Status: AC
  Administered 2022-03-25: 650 mg via ORAL
  Filled 2022-03-25: qty 2

## 2022-03-25 MED ORDER — METHYLPREDNISOLONE SODIUM SUCC 125 MG IJ SOLR
125.0000 mg | Freq: Once | INTRAMUSCULAR | Status: AC
  Administered 2022-03-25: 125 mg via INTRAVENOUS
  Filled 2022-03-25: qty 2

## 2022-03-25 MED ORDER — DEXAMETHASONE SODIUM PHOSPHATE 10 MG/ML IJ SOLN
6.0000 mg | INTRAMUSCULAR | Status: DC
  Administered 2022-03-26 – 2022-03-28 (×3): 6 mg via INTRAVENOUS
  Filled 2022-03-25 (×3): qty 1

## 2022-03-25 MED ORDER — ALBUTEROL SULFATE HFA 108 (90 BASE) MCG/ACT IN AERS: 2.0000 | INHALATION_SPRAY | RESPIRATORY_TRACT | Status: DC | PRN

## 2022-03-25 MED ORDER — NICOTINE 14 MG/24HR TD PT24
14.0000 mg | MEDICATED_PATCH | Freq: Every day | TRANSDERMAL | Status: DC
  Administered 2022-03-25 – 2022-03-30 (×6): 14 mg via TRANSDERMAL
  Filled 2022-03-25 (×6): qty 1

## 2022-03-25 MED ORDER — VITAMIN C 500 MG PO TABS
500.0000 mg | ORAL_TABLET | Freq: Every day | ORAL | Status: DC
  Administered 2022-03-26 – 2022-03-30 (×5): 500 mg via ORAL
  Filled 2022-03-25 (×5): qty 1

## 2022-03-25 MED ORDER — SODIUM CHLORIDE 0.9% FLUSH
3.0000 mL | Freq: Two times a day (BID) | INTRAVENOUS | Status: DC
  Administered 2022-03-26 – 2022-03-29 (×8): 3 mL via INTRAVENOUS

## 2022-03-25 MED ORDER — POLYETHYLENE GLYCOL 3350 17 G PO PACK: 17.0000 g | PACK | Freq: Every day | ORAL | Status: DC | PRN

## 2022-03-25 MED ORDER — ACETAMINOPHEN 500 MG PO TABS: 500.0000 mg | ORAL_TABLET | Freq: Three times a day (TID) | ORAL | Status: DC | PRN

## 2022-03-25 MED ORDER — ALBUTEROL SULFATE HFA 108 (90 BASE) MCG/ACT IN AERS
4.0000 | INHALATION_SPRAY | Freq: Once | RESPIRATORY_TRACT | Status: AC
  Administered 2022-03-25: 4 via RESPIRATORY_TRACT
  Filled 2022-03-25: qty 6.7

## 2022-03-25 MED ORDER — ZINC SULFATE 220 (50 ZN) MG PO CAPS
220.0000 mg | ORAL_CAPSULE | Freq: Every day | ORAL | Status: DC
  Administered 2022-03-26 – 2022-03-30 (×5): 220 mg via ORAL
  Filled 2022-03-25 (×5): qty 1

## 2022-03-25 MED ORDER — BUPROPION HCL ER (XL) 150 MG PO TB24
150.0000 mg | ORAL_TABLET | Freq: Every day | ORAL | Status: DC
  Administered 2022-03-26 – 2022-03-30 (×5): 150 mg via ORAL
  Filled 2022-03-25 (×5): qty 1

## 2022-03-25 MED ORDER — ADULT MULTIVITAMIN W/MINERALS CH
1.0000 | ORAL_TABLET | Freq: Every day | ORAL | Status: DC
  Administered 2022-03-26 – 2022-03-30 (×4): 1 via ORAL
  Filled 2022-03-25 (×5): qty 1

## 2022-03-25 MED ORDER — ATORVASTATIN CALCIUM 40 MG PO TABS
80.0000 mg | ORAL_TABLET | Freq: Every day | ORAL | Status: DC
  Administered 2022-03-25 – 2022-03-27 (×3): 80 mg via ORAL
  Filled 2022-03-25 (×3): qty 2

## 2022-03-25 MED ORDER — HYDROCOD POLI-CHLORPHE POLI ER 10-8 MG/5ML PO SUER: 5.0000 mL | Freq: Two times a day (BID) | ORAL | Status: DC | PRN

## 2022-03-25 MED ORDER — CLOPIDOGREL BISULFATE 75 MG PO TABS
75.0000 mg | ORAL_TABLET | Freq: Every day | ORAL | Status: DC
  Administered 2022-03-26 – 2022-03-30 (×5): 75 mg via ORAL
  Filled 2022-03-25 (×5): qty 1

## 2022-03-25 NOTE — ED Notes (Signed)
[facility] at bedside. Report given to [facility] RN. Pt stable for transport.

## 2022-03-25 NOTE — H&P (Signed)
History and Physical   Adrian DominionWillie Peth GEX:[id number] DOB: [birth year] DOA: 03/25/2022  PCP: Elsie AmisBajillan, Hendren Akram, MD   Patient coming from: Home  Chief Complaint: Weakness  HPI: Adrian Rodgers is a 81 y.o. male with medical history significant of mood disorder, history of CVA with left-sided weakness, hypertension, hyperlipidemia, emphysema, aortic aneurysm presenting with 2 days of progressive weakness.  As above patient does have chronic residual left-sided weakness from prior stroke however he has had 2 days of generalized weakness as well.  He is also noted to be walking slower and responding slower.  He has had chills and body aches as well as light headedness and cough.  He also reports some intermittent shortness of breath worse on exertion.  He denies fevers, chest pain, abdominal pain, constipation, diarrhea, nausea, vomiting.  ED Course: Vital signs in the ED significant for blood pressure in the 120s to 140s systolic, respiratory rate in the teens to 20s, requiring 2 L to maintain saturations and desaturating to around 84% just on standing.  Lab work-up included CMP with creatinine somewhat elevated from baseline at 1.29 from baseline of 1.1, calcium 8.6.  CBC within normal limits.  COVID screening positive.  Urinalysis, urine culture, blood cultures pending.  CRP mildly elevated at 1.1, procalcitonin pending.  D-dimer normal.  LDH normal.  Chest x-ray showing no acute abnormality.  Patient received Tylenol, Solu-Medrol, molnupiravir, home meds in the ED.  Review of Systems: As per HPI otherwise all other systems reviewed and are negative.  Past Medical History:  Diagnosis Date   Community acquired pneumonia of right middle lobe of lung 09/20/2017   High cholesterol    Hypertension    Sepsis (HCC) 09/19/2017   Stroke (HCC) 05/01/2020   Tobacco dependence     Past Surgical History:  Procedure Laterality Date   implantable loop recorder placement  06/05/2020   [employer]  Reveal OdellLinq model [id number] [phone]([id number]) implantable loop recorder     Social History  reports that he has been smoking cigarettes. He has a 20.00 pack-year smoking history. He has never used smokeless tobacco. He reports current alcohol use of about 1.0 standard drink of alcohol per week. He reports that he does not use drugs.  No Known Allergies  Family History  Family history unknown: Yes  Reviewed on admission  Prior to Admission medications   Medication Sig Start Date End Date Taking? Authorizing Provider  acetaminophen (TYLENOL) 500 MG tablet Take 500-1,000 mg by mouth every 8 (eight) hours as needed for mild pain (or headaches).     [provider]  albuterol (PROVENTIL HFA;VENTOLIN HFA) 108 (90 Base) MCG/ACT inhaler Inhale 2 puffs into the lungs every 6 (six) hours as needed for wheezing or shortness of breath. 09/21/17   Purohit, Salli QuarryShrey C, MD  atorvastatin (LIPITOR) 80 MG tablet Take 80 mg by mouth daily. 05/18/20   [provider]  buPROPion (WELLBUTRIN XL) 150 MG 24 hr tablet Take 150 mg by mouth daily. 01/15/21   [provider]  clopidogrel (PLAVIX) 75 MG tablet Take 1 tablet by mouth daily. 10/23/21   [provider]  losartan (COZAAR) 50 MG tablet Take 50 mg by mouth daily. 05/02/20   [provider]  Multiple Vitamins-Minerals (CENTRUM SILVER 50+MEN) TABS Take 1 tablet by mouth daily with breakfast.    [provider]  nicotine (NICODERM CQ - DOSED IN MG/24 HOURS) 14 mg/24hr patch Place 14 mg onto the skin daily.    [provider]  Physical Exam: Vitals:   03/25/22 1900 03/25/22 1930 03/25/22 2000 03/25/22 2039  BP: (!) 149/91 (!) 145/92 (!) 146/99 (!) 146/89  Pulse: 85 89 83 79  Resp: (!) 28 (!) 21 (!) 27 20  Temp:    98.4 F (36.9 C)  TempSrc:    Oral  SpO2: 96% 94% 94% 98%  Weight:    81.9 kg  Height:    5\' 8"  (1.727 m)    Physical Exam Constitutional:      General: He is not in acute distress.     Appearance: Normal appearance.  HENT:     Head: Normocephalic and atraumatic.     Mouth/Throat:     Mouth: Mucous membranes are moist.     Pharynx: Oropharynx is clear.  Eyes:     Extraocular Movements: Extraocular movements intact.     Pupils: Pupils are equal, round, and reactive to light.  Cardiovascular:     Rate and Rhythm: Normal rate and regular rhythm.     Pulses: Normal pulses.     Heart sounds: Normal heart sounds.  Pulmonary:     Effort: Pulmonary effort is normal. No respiratory distress.     Breath sounds: Normal breath sounds.  Abdominal:     General: Bowel sounds are normal. There is no distension.     Palpations: Abdomen is soft.     Tenderness: There is no abdominal tenderness.  Musculoskeletal:        General: No swelling or deformity.  Skin:    General: Skin is warm and dry.  Neurological:     General: No focal deficit present.     Mental Status: Mental status is at baseline.    Labs on Admission: I have personally reviewed following labs and imaging studies  CBC: Recent Labs  Lab 03/25/22 1443  WBC 5.0  NEUTROABS 3.2  HGB 13.8  HCT 42.2  MCV 92.1  PLT [id number]    Basic Metabolic Panel: Recent Labs  Lab 03/25/22 1443  NA 135  K 3.7  CL 100  CO2 25  GLUCOSE 93  BUN 14  CREATININE 1.29*  CALCIUM 8.6*    GFR: Estimated Creatinine Clearance: 44.2 mL/min (A) (by C-G formula based on SCr of 1.29 mg/dL (H)).  Liver Function Tests: Recent Labs  Lab 03/25/22 1539  AST 17  ALT 26  ALKPHOS 77  BILITOT 0.9  PROT 7.1  ALBUMIN 3.8    Urine analysis:    Component Value Date/Time   COLORURINE YELLOW 03/26/2021 0600   APPEARANCEUR CLEAR 03/26/2021 0600   LABSPEC 1.010 03/26/2021 0600   PHURINE 6.0 03/26/2021 0600   GLUCOSEU NEGATIVE 03/26/2021 0600   HGBUR MODERATE (A) 03/26/2021 0600   BILIRUBINUR NEGATIVE 03/26/2021 0600   KETONESUR NEGATIVE 03/26/2021 0600   PROTEINUR NEGATIVE 03/26/2021 0600   NITRITE NEGATIVE 03/26/2021 0600    LEUKOCYTESUR SMALL (A) 03/26/2021 0600    Radiological Exams on Admission: DG Chest 2 View  Result Date: 03/25/2022 CLINICAL DATA:  Shortness of breath, cough. EXAM: CHEST - 2 VIEW COMPARISON:  October 09, 2017. FINDINGS: The heart size and mediastinal contours are within normal limits. Both lungs are clear. The visualized skeletal structures are unremarkable. IMPRESSION: No active cardiopulmonary disease. Electronically Signed   By: Marijo Conception M.D.   On: 03/25/2022 15:05    EKG: Independently reviewed.  Sinus rhythm at 85 bpm.  PVC noted.  Right bundle branch block.  Some baseline wander.  Nonspecific T wave flattening.  Assessment/Plan Principal Problem:  Acute respiratory failure due to COVID-19 [facility]) Active Problems:   History of CVA (cerebrovascular accident)   Essential hypertension   HLD (hyperlipidemia)   Mood disorder (HCC)   Acute respiratory failure due to COVID-19 > Presenting with generalized weakness with chills and body aches as well as lightheadedness and cough and shortness of breath.  Found to be hypoxic dropping to 84% on 6 standing.  Requiring 2 L to maintain saturations in the ED.  Lab work-up came back positive for COVID-19.  Negative for any other respiratory issues initially. > Patient received Tylenol, molnupiravir, Solu-Medrol in the ED. - Monitor on telemetry - Continue with daily Decadron - Continue molnupiravir - Pulmonary hygiene - Zinc, vitamin C - As needed inhaler - Daily CBC, CMP, CRP, ferritin  Emphysema > As needed Combivent as above  History of CVA > Left-sided deficits - Continue home atorvastatin and Plavix  Hyperlipidemia - Continue home atorvastatin  Hypertension - Continue home losartan  Mood disorder - Continue home bupropion  Tobacco use - Continue with nicotine patch  DVT prophylaxis: Lovenox Code Status:   Full Family Communication:  Daughter updated by phone  Disposition Plan:   Patient is  from:  Home  Anticipated DC to:  Home  Anticipated DC date:  1 to 3 days  Anticipated DC barriers: None  Consults called:  None Admission status:  Observation, telemetry  Severity of Illness: The appropriate patient status for this patient is OBSERVATION. Observation status is judged to be reasonable and necessary in order to provide the required intensity of service to ensure the patient's safety. The patient's presenting symptoms, physical exam findings, and initial radiographic and laboratory data in the context of their medical condition is felt to place them at decreased risk for further clinical deterioration. Furthermore, it is anticipated that the patient will be medically stable for discharge from the hospital within 2 midnights of admission.    Marcelyn Bruins MD [facility]  How to contact the [facility] Attending or Consulting provider [place] or covering provider during after hours [place], for this patient?   Check the care team in [facility] and look for a) attending/consulting [facility] provider listed and b) the [facility] team listed Log into [email or web address] and use [place]'s universal password to access. If you do not have the password, please contact the hospital operator. Locate the [facility] provider you are looking for under [facility] and page to a number that you can be directly reached. If you still have difficulty reaching the provider, please page the [facility] (Director on Call) for the Hospitalists listed on amion for assistance.  03/25/2022, 9:50 PM

## 2022-03-25 NOTE — ED Triage Notes (Signed)
C/o generalized weakness, chills, nausea, lightheadedness since yesterday. Hx of stroke, expressive aphasia & left sided weakness at baseline, no new changes.

## 2022-03-25 NOTE — ED Provider Notes (Signed)
[facility] EMERGENCY DEPARTMENT Provider Note   CSN: [id number] Arrival date & time: 03/25/22  1425     History  Chief Complaint  Patient presents with   Weakness    Adrian Rodgers is a 81 y.o. male.  HPI 81 year old male presents with generalized weakness, chills and body aches, and lightheadedness.  Started 2 days ago.  Patient has a history of a stroke and chronic left-sided weakness but he now feels generally weak.  He has not had vomiting or diarrhea or abdominal pain.  Daughter at the bedside states his cough is stronger than his baseline cough from emphysema and current smoking.  He denies headache or chest pain.  Daughter states he has been shuffling with his gait which is abnormal for him and is slower.  He is slower to respond but not altered.  Patient has endorsed a little bit of rhinorrhea as well.  Daughter has noticed him breathing harder and he endorses that sometimes he is short of breath.  Home Medications Prior to Admission medications   Medication Sig Start Date End Date Taking? Authorizing Provider  acetaminophen (TYLENOL) 500 MG tablet Take 500-1,000 mg by mouth every 8 (eight) hours as needed for mild pain (or headaches).     [provider]  albuterol (PROVENTIL HFA;VENTOLIN HFA) 108 (90 Base) MCG/ACT inhaler Inhale 2 puffs into the lungs every 6 (six) hours as needed for wheezing or shortness of breath. 09/21/17   Purohit, Salli Quarry, MD  atorvastatin (LIPITOR) 80 MG tablet Take 80 mg by mouth daily. 05/18/20   [provider]  buPROPion (WELLBUTRIN XL) 150 MG 24 hr tablet Take 150 mg by mouth daily. 01/15/21   [provider]  clopidogrel (PLAVIX) 75 MG tablet Take 1 tablet by mouth daily. 10/23/21   [provider]  losartan (COZAAR) 50 MG tablet Take 50 mg by mouth daily. 05/02/20   [provider]  Multiple Vitamins-Minerals (CENTRUM SILVER 50+MEN) TABS Take 1 tablet by mouth daily with breakfast.    [provider]  nicotine (NICODERM CQ - DOSED IN MG/24 HOURS) 14 mg/24hr patch Place 14 mg onto the skin daily.    [provider]      Allergies    Patient has no known allergies.    Review of Systems   Review of Systems  Constitutional:  Positive for fatigue and fever.  HENT:  Positive for rhinorrhea. Negative for sore throat.   Respiratory:  Positive for cough and shortness of breath.   Cardiovascular:  Negative for chest pain.  Gastrointestinal:  Negative for abdominal pain, diarrhea and vomiting.  Genitourinary:  Positive for dysuria (for about 1 week or so).  Musculoskeletal:  Negative for back pain.  Neurological:  Positive for weakness and light-headedness. Negative for headaches.    Physical Exam Updated Vital Signs BP (!) 156/95   Pulse 83   Temp 98.9 F (37.2 C)   Resp (!) 27   Ht 6' (1.829 m)   Wt 81.6 kg   SpO2 99%   BMI 24.41 kg/m  Physical Exam Vitals and nursing note reviewed.  Constitutional:      Appearance: He is well-developed.  HENT:     Head: Normocephalic and atraumatic.  Eyes:     Extraocular Movements: Extraocular movements intact.     Pupils: Pupils are equal, round, and reactive to light.  Cardiovascular:     Rate and Rhythm: Normal rate and regular rhythm.     Heart sounds: Normal heart  sounds.  Pulmonary:     Effort: Pulmonary effort is normal. Tachypnea present. No accessory muscle usage.     Breath sounds: Wheezing (diffuse, expiratory) present.  Abdominal:     General: There is no distension.     Palpations: Abdomen is soft.     Tenderness: There is no abdominal tenderness.  Musculoskeletal:     Cervical back: No rigidity.  Skin:    General: Skin is warm and dry.     Findings: No rash.  Neurological:     Mental Status: He is alert.     Comments: CN 3-12 grossly intact. 5/5 strength in all 4 extremities. Perhaps mild weakness in left extremities but is subtle. Grossly normal sensation. Normal finger to nose.      ED  Results / Procedures / Treatments   Labs (all labs ordered are listed, but only abnormal results are displayed) Labs Reviewed  SARS CORONAVIRUS 2 BY RT PCR - Abnormal; Notable for the following components:      Result Value   SARS Coronavirus 2 by RT PCR POSITIVE (*)    All other components within normal limits  BASIC METABOLIC PANEL - Abnormal; Notable for the following components:   Creatinine, Ser 1.29 (*)    Calcium 8.6 (*)    GFR, Estimated 56 (*)    All other components within normal limits  URINE CULTURE  CULTURE, BLOOD (ROUTINE X 2)  CULTURE, BLOOD (ROUTINE X 2)  CBC WITH DIFFERENTIAL/PLATELET  HEPATIC FUNCTION PANEL  URINALYSIS, ROUTINE W REFLEX MICROSCOPIC  D-DIMER, QUANTITATIVE  PROCALCITONIN  LACTATE DEHYDROGENASE  C-REACTIVE PROTEIN    EKG EKG Interpretation  Date/Time:  Monday March 25 2022 14:39:13 EDT Ventricular Rate:  99 PR Interval:  168 QRS Duration: 104 QT Interval:  362 QTC Calculation: 464 R Axis:   58 Text Interpretation: Normal sinus rhythm Incomplete right bundle branch block Anterior infarct , age undetermined  similar to Sept 2022 Confirmed by Pricilla Loveless [phone]) on 03/25/2022 3:08:57 PM  Radiology DG Chest 2 View  Result Date: 03/25/2022 CLINICAL DATA:  Shortness of breath, cough. EXAM: CHEST - 2 VIEW COMPARISON:  October 09, 2017. FINDINGS: The heart size and mediastinal contours are within normal limits. Both lungs are clear. The visualized skeletal structures are unremarkable. IMPRESSION: No active cardiopulmonary disease. Electronically Signed   By: Lupita Raider M.D.   On: 03/25/2022 15:05    Procedures Procedures    Medications Ordered in ED Medications  molnupiravir EUA (LAGEVRIO) capsule 800 mg (has no administration in time range)  lactated ringers bolus 1,000 mL (0 mLs Intravenous Stopped 03/25/22 1642)  acetaminophen (TYLENOL) tablet 650 mg (650 mg Oral Given 03/25/22 1600)  albuterol (VENTOLIN HFA) 108 (90 Base) MCG/ACT  inhaler 4 puff (4 puffs Inhalation Given 03/25/22 1553)  methylPREDNISolone sodium succinate (SOLU-MEDROL) 125 mg/2 mL injection 125 mg (125 mg Intravenous Given 03/25/22 1725)    ED Course/ Medical Decision Making/ A&P Clinical Course as of 03/25/22 1750  Mon Mar 25, 2022  1537 From initial assessment with some tachypnea and complaints of subjective fever, I was concerned about sepsis and ordered appropriate work-up.  However his COVID test has come back positive which I think would explain all of his symptoms and account for his weakness.  We will give albuterol inhaler and see if this helps relieve some of his dyspnea and give fluids.  We will still check urine based on his dysuria but my suspicion that he has a bacterial illness is lower now. [  SG]    Clinical Course User Index [SG] Sherwood Gambler, MD                           Medical Decision Making Amount and/or Complexity of Data Reviewed Independent Historian:     Details: Daughter Labs: ordered.    Details: Normal WBC and hemoglobin. Cr near baseline. No significant electrolyte disturbance. Covid test is positive. Radiology: ordered and independent interpretation performed.    Details: CXR - no pneumonia ECG/medicine tests: ordered and independent interpretation performed.    Details: No acute ischemia  Risk OTC drugs. Prescription drug management. Decision regarding hospitalization.   Patient is found to be COVID-positive which seems to account for all of his symptoms.  He has a prior history of emphysema and is certainly having some wheezing here.  Does feel little better after albuterol.  As above, labs are pretty much unremarkable.  However when trying to stand him up to see if he can walk, he desaturated to 84% just by standing.  I think this is why he is feeling so weak.  He has been placed on 2 L and we will start IV Solu-Medrol and he will need admission for respiratory failure. I discussed with Dr. Algis Liming. He will  admit. Asks to start antivirals (will start molnupiravir based on atorvastatin use).         Final Clinical Impression(s) / ED Diagnoses Final diagnoses:  Acute respiratory failure with hypoxia ([place])  COVID-19    Rx / DC Orders ED Discharge Orders     None         Sherwood Gambler, MD 03/25/22 1750

## 2022-03-25 NOTE — ED Notes (Signed)
Patient is unable to urinate at this time 

## 2022-03-25 NOTE — Progress Notes (Signed)
TRH transfer acceptance note:  Transferring facility: [place] Transferring MD: Crist Infante, MD, EDP Accepting facility: [facility], telemetry bed, observation status.  81 year old male with PMH of COPD, ongoing tobacco use, CVA, chronic left-sided weakness, presented to ED with generalized weakness, chills, body aches and lightheadedness, cough, rhinorrhea and difficulty breathing x2 days.  In ED tachypneic, wheezing, improved after nebulization but hypoxic just trending up next to bed at 84% on room air, placed on oxygen.  Lab work significant for creatinine of 1.29.  Otherwise CMP and CBC unremarkable.  Chest x-ray negative.  SARS coronavirus 2 by RT-PCR positive.  Has been given a dose of IV Solu-Medrol.  Assessment and plan Acute respiratory failure with hypoxia due to COVID-19 acute bronchitis complicating underlying COPD/emphysema. Accepted to telemetry bed under observation status Will need admit orders and H&P on arrival to [facility].  Vernell Leep, MD,  FACP, [place], [facility], [facility], [facility]  [facility] Advisor [place]   To contact the attending provider between 7A-7P or the covering provider during after hours 7P-7A, please log into the web site [email or web address] and access using universal [place] password for that web site. If you do not have the password, please call the hospital operator.

## 2022-03-25 NOTE — ED Notes (Signed)
RT attempted to ambulate patient. He got very SOB and SAT dropped to 84% just getting to side of bed. Placed on [facility]. MD aware

## 2022-03-26 DIAGNOSIS — J9600 Acute respiratory failure, unspecified whether with hypoxia or hypercapnia: Secondary | ICD-10-CM | POA: Diagnosis not present

## 2022-03-26 DIAGNOSIS — U071 COVID-19: Secondary | ICD-10-CM | POA: Diagnosis not present

## 2022-03-26 LAB — CBC WITH DIFFERENTIAL/PLATELET
Abs Immature Granulocytes: 0.01 10*3/uL (ref 0.00–0.07)
Basophils Absolute: 0 10*3/uL (ref 0.0–0.1)
Basophils Relative: 0 %
Eosinophils Absolute: 0 10*3/uL (ref 0.0–0.5)
Eosinophils Relative: 0 %
HCT: 43.3 % (ref 39.0–52.0)
Hemoglobin: 14 g/dL (ref 13.0–17.0)
Immature Granulocytes: 0 %
Lymphocytes Relative: 27 %
Lymphs Abs: 0.9 10*3/uL (ref 0.7–4.0)
MCH: 30.7 pg (ref 26.0–34.0)
MCHC: 32.3 g/dL (ref 30.0–36.0)
MCV: 95 fL (ref 80.0–100.0)
Monocytes Absolute: 0.1 10*3/uL (ref 0.1–1.0)
Monocytes Relative: 3 %
Neutro Abs: 2.3 10*3/uL (ref 1.7–7.7)
Neutrophils Relative %: 70 %
Platelets: 351 10*3/uL (ref 150–400)
RBC: 4.56 MIL/uL (ref 4.22–5.81)
RDW: 14.4 % (ref 11.5–15.5)
WBC: 3.3 10*3/uL — ABNORMAL LOW (ref 4.0–10.5)
nRBC: 0 % (ref 0.0–0.2)

## 2022-03-26 LAB — COMPREHENSIVE METABOLIC PANEL
ALT: 24 U/L (ref 0–44)
AST: 15 U/L (ref 15–41)
Albumin: 3.4 g/dL — ABNORMAL LOW (ref 3.5–5.0)
Alkaline Phosphatase: 70 U/L (ref 38–126)
Anion gap: 11 (ref 5–15)
BUN: 15 mg/dL (ref 8–23)
CO2: 25 mmol/L (ref 22–32)
Calcium: 8.8 mg/dL — ABNORMAL LOW (ref 8.9–10.3)
Chloride: 105 mmol/L (ref 98–111)
Creatinine, Ser: 0.89 mg/dL (ref 0.61–1.24)
GFR, Estimated: >60 mL/min (ref >60)
Glucose, Bld: 140 mg/dL — ABNORMAL HIGH (ref 70–99)
Potassium: 4 mmol/L (ref 3.5–5.1)
Sodium: 141 mmol/L (ref 135–145)
Total Bilirubin: 0.7 mg/dL (ref 0.3–1.2)
Total Protein: 6.8 g/dL (ref 6.5–8.1)

## 2022-03-26 LAB — C-REACTIVE PROTEIN: CRP: 1.3 mg/dL — ABNORMAL HIGH (ref <1.0)

## 2022-03-26 LAB — FERRITIN: Ferritin: 141 ng/mL (ref 24–336)

## 2022-03-26 MED ORDER — ONDANSETRON HCL 4 MG/2ML IJ SOLN
4.0000 mg | Freq: Four times a day (QID) | INTRAMUSCULAR | Status: DC | PRN
  Administered 2022-03-26 – 2022-03-27 (×2): 4 mg via INTRAVENOUS
  Filled 2022-03-26 (×2): qty 2

## 2022-03-26 NOTE — Care Management Obs Status (Signed)
[place] NOTIFICATION   Patient Details  Name: Kamarie Palma MRN: [id number] Date of Birth: 1941/05/08   Medicare Observation Status Notification Given:  Yes    MahabirJuliann Pulse, RN 03/26/2022, 3:34 PM

## 2022-03-26 NOTE — TOC Initial Note (Addendum)
Transition of Care [facility]) - Initial/Assessment Note    Patient Details  Name: Adrian Rodgers MRN: [id number] Date of Birth: 1941/05/28  [facility] of [facility]) CM/SW Contact:    Dessa Phi, RN Phone Number: 03/26/2022, 3:36 PM  Clinical Narrative: Damaris Schooner to dtr Denman George about d/c plans-home.await PT eval & recc.On 02 monitor.Has own transport home.                  Expected Discharge Plan: Home/Self Care Barriers to Discharge: Continued Medical Work up   Patient Goals and CMS Choice Patient states their goals for this hospitalization and ongoing recovery are::  (Home) CMS Medicare.gov Compare Post Acute Care list provided to:: Patient Represenative (must comment)    Expected Discharge Plan and Services Expected Discharge Plan: Home/Self Care   Discharge Planning Services: CM Consult   Living arrangements for the past 2 months: Single Family Home                                      Prior Living Arrangements/Services Living arrangements for the past 2 months: Single Family Home Lives with:: Self Patient language and need for interpreter reviewed:: Yes Do you feel safe going back to the place where you live?: Yes      Need for Family Participation in Patient Care: Yes (Comment) Care giver support system in place?: Yes (comment) Current home services: DME (cane,rw,shower chair) Criminal Activity/Legal Involvement Pertinent to Current Situation/Hospitalization: No - Comment as needed  Activities of Daily Living Home Assistive Devices/Equipment: Walker (specify type) ADL Screening (condition at time of admission) Patient's cognitive ability adequate to safely complete daily activities?: Yes Is the patient deaf or have difficulty hearing?: No Does the patient have difficulty seeing, even when wearing glasses/contacts?: Yes Does the patient have difficulty concentrating, remembering, or making decisions?: No Patient able to express need for assistance with ADLs?:  Yes Does the patient have difficulty dressing or bathing?: No Independently performs ADLs?: Yes (appropriate for developmental age) Does the patient have difficulty walking or climbing stairs?: Yes Weakness of Legs: Both Weakness of Arms/Hands: None  Permission Sought/Granted Permission sought to share information with : Case Manager Permission granted to share information with : Yes, Verbal Permission Granted  Share Information with NAME:  (Case manager)           Emotional Assessment Appearance:: Appears stated age Attitude/Demeanor/Rapport: Gracious Affect (typically observed): Accepting Orientation: : Oriented to Self, Oriented to Place, Oriented to  Time, Oriented to Situation Alcohol / Substance Use: Not Applicable Psych Involvement: No (comment)  Admission diagnosis:  Acute respiratory failure with hypoxia ([place]) [J96.01] Acute respiratory failure due to COVID-19 ([place]) [U07.1, J96.00] COVID-19 [U07.1] Patient Active Problem List   Diagnosis Date Noted   Acute respiratory failure due to COVID-19 ([place]) 03/25/2022   Mood disorder ([place]) 03/28/2021   Left-sided weakness 03/27/2021   Ascending aortic aneurysm ([place]) 09/22/2020   Other emphysema ([place]) 05/18/2020   Essential hypertension 05/13/2020   HLD (hyperlipidemia) 05/13/2020   History of CVA (cerebrovascular accident) 05/12/2020   PCP:  Cena Benton, MD Pharmacy:   [facility] [phone] - [place], [address] AT [address] [place] [address] Phone: [phone] Fax: [phone]     Social Determinants of Health (SDOH) Interventions    Readmission Risk Interventions     No data to display

## 2022-03-26 NOTE — Progress Notes (Signed)
PROGRESS NOTE  Adrian Rodgers  OYD:[id number] DOB: 08/02/1940 DOA: 03/25/2022 PCP: Cena Benton, MD   Brief Narrative:  Patient is a 81 year old male with history of mood disorder, CVA with left-sided weakness, hypertension, hyperlipidemia, emphysema who presented from home with 2 days history of progressive weakness. He lives alone. Also complained of chills, body aches, lightheadedness, cough, intermittent shortness of breath on exertion.  On presentation ,he was hypoxic on room air requiring 2 L.  Labs showed a creatinine of 1.29, slightly up from baseline.  COVID screen test was positive.  CRP of 1.1.  Chest imaging did not show pneumonia.  Patient was started on steroids, molnupiravir.   Assessment & Plan:  Principal Problem:   Acute respiratory failure due to COVID-19 [facility]) Active Problems:   History of CVA (cerebrovascular accident)   Essential hypertension   HLD (hyperlipidemia)   Mood disorder ([place])   Acute respiratory failure with hypoxia secondary to COVID-19: Not on oxygen at home.  Presented with weakness, chills, body aches, cough, shortness of breath.  Found to be hypoxic on room air, now on 2 L of oxygen.  We will try to wean the oxygen.  No evidence of pneumonia as per chest x-ray. Continue management for COVID illness.  Currently on steroids, molnupiravir. Continue incentive spirometer, flutter valve, mucolytics, inhaled bronchodilator, zinc, vitamin C.  Continue to monitor inflammatory markers.  History of emphysema: Not on oxygen at home.  Continue current management.  We will try to wean the oxygen.  History of CVA: Has residual left-sided weakness.  Continue home atorvastatin, Plavix.  Ambulates with the help of walker  History of hyperlipidemia: Continue atorvastatin  Hypertension: Monitor blood pressure.  On losartan.  Mood disorder: On bupropion  Tobacco use: Continue nicotine patch, smokes 10 cigarettes a day.  Counseled for  cessation.  Generalized weakness: We will consult PT/OT.  Lives alone.  Looks deconditioned          DVT prophylaxis:enoxaparin (LOVENOX) injection 40 mg Start: 03/25/22 2200     Code Status: Full Code  Family Communication: None at bedside  Patient status:Obs  Patient is from :Home  Anticipated discharge EH:MCNO  Estimated DC date:2-3 days   Consultants: None  Procedures:None  Antimicrobials:  Anti-infectives (From admission, onward)    Start     Dose/Rate Route Frequency Ordered Stop   03/25/22 2200  molnupiravir EUA (LAGEVRIO) capsule 800 mg        4 capsule Oral 2 times daily 03/25/22 1750 03/30/22 2159       Subjective:  Patient seen and examined at bedside today.  On 1 L of oxygen.  Looks comfortable, denies any worsening cough or shortness of breath.  Speaking in full sentences.  Objective: Vitals:   03/25/22 2000 03/25/22 2039 03/26/22 0025 03/26/22 0449  BP: (!) 146/99 (!) 146/89 138/88 (!) 124/95  Pulse: 83 79 69 69  Resp: (!) 27 20 (!) 21 16  Temp:  98.4 F (36.9 C) 98.1 F (36.7 C) 97.7 F (36.5 C)  TempSrc:  Oral Oral Oral  SpO2: 94% 98% 98% 92%  Weight:  81.9 kg    Height:  5\' 8"  (1.727 m)      Intake/Output Summary (Last 24 hours) at 03/26/2022 0801 Last data filed at 03/26/2022 0026 Gross per 24 hour  Intake --  Output 800 ml  Net -800 ml   Filed Weights   03/25/22 1436 03/25/22 2039  Weight: 81.6 kg 81.9 kg    Examination:  General exam: Overall  comfortable, not in distress HEENT: PERRL Respiratory system:  no wheezes or crackles but diminished sounds bilaterally Cardiovascular system: S1 & S2 heard, RRR.  Gastrointestinal system: Abdomen is nondistended, soft and nontender. Central nervous system: Alert and oriented Extremities: No edema, no clubbing ,no cyanosis Skin: No rashes, no ulcers,no icterus     Data Reviewed: I have personally reviewed following labs and imaging studies  CBC: Recent Labs  Lab  03/25/22 1443 03/26/22 0521  WBC 5.0 3.3*  NEUTROABS 3.2 2.3  HGB 13.8 14.0  HCT 42.2 43.3  MCV 92.1 95.0  PLT 357 351   Basic Metabolic Panel: Recent Labs  Lab 03/25/22 1443 03/26/22 0521  NA 135 141  K 3.7 4.0  CL 100 105  CO2 25 25  GLUCOSE 93 140*  BUN 14 15  CREATININE 1.29* 0.89  CALCIUM 8.6* 8.8*     Recent Results (from the past 240 hour(s))  SARS Coronavirus 2 by RT PCR (hospital order, performed in [facility] hospital lab) *cepheid single result test* Anterior Nasal Swab     Status: Abnormal   Collection Time: 03/25/22  2:42 PM   Specimen: Anterior Nasal Swab  Result Value Ref Range Status   SARS Coronavirus 2 by RT PCR POSITIVE (A) NEGATIVE Final    Comment: (NOTE) SARS-CoV-2 target nucleic acids are DETECTED  SARS-CoV-2 RNA is generally detectable in upper respiratory specimens  during the acute phase of infection.  Positive results are indicative  of the presence of the identified virus, but do not rule out bacterial infection or co-infection with other pathogens not detected by the test.  Clinical correlation with patient history and  other diagnostic information is necessary to determine patient infection status.  The expected result is negative.  Fact Sheet for Patients:   RoadLapTop.co.za   Fact Sheet for Healthcare Providers:   http://kim-miller.com/    This test is not yet approved or cleared by the [place] FDA and  has been authorized for detection and/or diagnosis of SARS-CoV-2 by FDA under an Emergency Use Authorization (EUA).  This EUA will remain in effect (meaning this test can be used) for the duration of  the COVID-19 declaration under Section 564(b)(1)  of the Act, 21 U.S.C. section 360-bbb-3(b)(1), unless the authorization is terminated or revoked sooner.   Performed at [facility], [address] [place], [place] [address]      Radiology Studies: DG Chest 2  View  Result Date: 03/25/2022 CLINICAL DATA:  Shortness of breath, cough. EXAM: CHEST - 2 VIEW COMPARISON:  October 09, 2017. FINDINGS: The heart size and mediastinal contours are within normal limits. Both lungs are clear. The visualized skeletal structures are unremarkable. IMPRESSION: No active cardiopulmonary disease. Electronically Signed   By: Lupita Raider M.D.   On: 03/25/2022 15:05    Scheduled Meds:  vitamin C  500 mg Oral Daily   atorvastatin  80 mg Oral Daily   buPROPion  150 mg Oral Daily   clopidogrel  75 mg Oral Daily   dexamethasone (DECADRON) injection  6 mg Intravenous Q24H   enoxaparin (LOVENOX) injection  40 mg Subcutaneous Q24H   losartan  50 mg Oral Daily   molnupiravir EUA  4 capsule Oral BID   multivitamin with minerals  1 tablet Oral Q breakfast   nicotine  14 mg Transdermal Daily   sodium chloride flush  3 mL Intravenous Q12H   zinc sulfate  220 mg Oral Daily   Continuous Infusions:  LOS: 0 days   Burnadette Pop, MD [facility] P9/26/2023, 8:01 AM

## 2022-03-26 NOTE — Evaluation (Signed)
Physical Therapy Evaluation Patient Details Name: Adrian Rodgers MRN: [id number] DOB: [birth year] Today's Date: 03/26/2022  History of Present Illness  81 year old male with history of mood disorder, CVA with left-sided weakness September 2022, hypertension, hyperlipidemia, emphysema who presented from home with 2 days history of progressive weakness. He lives alone. Also complained of chills, body aches, lightheadedness, cough, intermittent shortness of breath on exertion.  On presentation ,he was hypoxic on room air requiring 2 L.  Labs showed a creatinine of 1.29, slightly up from baseline.  COVID screen test was positive.  Clinical Impression  Pt admitted with above diagnosis. Pt ambulated 14' x 2 with RW, SaO2 85% on room air walking, 90% on 2L O2 at rest.  Pt currently with functional limitations due to the deficits listed below (see PT Problem List). Pt will benefit from skilled PT to increase their independence and safety with mobility to allow discharge to the venue listed below.          Recommendations for follow up therapy are one component of a multi-disciplinary discharge planning process, led by the attending physician.  Recommendations may be updated based on patient status, additional functional criteria and insurance authorization.  Follow Up Recommendations Home health PT      Assistance Recommended at Discharge Intermittent Supervision/Assistance  Patient can return home with the following  Assistance with cooking/housework;Assist for transportation;Help with stairs or ramp for entrance    Equipment Recommendations None recommended by PT  Recommendations for Other Services       Functional Status Assessment Patient has had a recent decline in their functional status and demonstrates the ability to make significant improvements in function in a reasonable and predictable amount of time.     Precautions / Restrictions Precautions Precautions: Other  (comment) Precaution Comments: monitor O2 Restrictions Weight Bearing Restrictions: No      Mobility  Bed Mobility Overal bed mobility: Modified Independent             General bed mobility comments: HOB up, used rail    Transfers Overall transfer level: Needs assistance Equipment used: Rolling walker (2 wheels) Transfers: Sit to/from Stand Sit to Stand: Min guard           General transfer comment: VCs hand placement    Ambulation/Gait Ambulation/Gait assistance: Supervision Gait Distance (Feet): 28 Feet Assistive device: Rolling walker (2 wheels) Gait Pattern/deviations: Step-through pattern, Decreased stride length Gait velocity: decr     General Gait Details: 14' x 2 with RW, no loss of balance; SaO2 85% on room air walking, applied 2L O2 and SaO2 came up to 90%  Stairs            Wheelchair Mobility    Modified Rankin (Stroke Patients Only)       Balance Overall balance assessment: Modified Independent                                           Pertinent Vitals/Pain Pain Assessment Pain Assessment: No/denies pain    Home Living Family/patient expects to be discharged to:: Private residence Living Arrangements: Alone Available Help at Discharge: Family;Available PRN/intermittently Type of Home: House Home Access: Stairs to enter Entrance Stairs-Rails: Right Entrance Stairs-Number of Steps: 4   Home Layout: One level Home Equipment: Agricultural consultant (2 wheels);Gilmer Mor - single point Additional Comments: daughter lives close by and can assist as needed    Prior  Function Prior Level of Function : Independent/Modified Independent;Driving             Mobility Comments: drives, walks with RW or SPC, no falls in pas 6 months ADLs Comments: independent     Hand Dominance        Extremity/Trunk Assessment   Upper Extremity Assessment Upper Extremity Assessment: Overall WFL for tasks assessed    Lower Extremity  Assessment Lower Extremity Assessment: Overall WFL for tasks assessed    Cervical / Trunk Assessment Cervical / Trunk Assessment: Normal  Communication   Communication: No difficulties  Cognition Arousal/Alertness: Awake/alert Behavior During Therapy: WFL for tasks assessed/performed Overall Cognitive Status: Within Functional Limits for tasks assessed                                          General Comments      Exercises     Assessment/Plan    PT Assessment Patient needs continued PT services  PT Problem List Decreased activity tolerance;Decreased mobility       PT Treatment Interventions Gait training;Therapeutic exercise;Patient/family education;Functional mobility training;Therapeutic activities    PT Goals (Current goals can be found in the Care Plan section)  Acute Rehab PT Goals Patient Stated Goal: get strong enough to DC home PT Goal Formulation: With patient Time For Goal Achievement: 04/09/22 Potential to Achieve Goals: Good    Frequency Min 3X/week     Co-evaluation               AM-PAC PT "6 Clicks" Mobility  Outcome Measure Help needed turning from your back to your side while in a flat bed without using bedrails?: None Help needed moving from lying on your back to sitting on the side of a flat bed without using bedrails?: None Help needed moving to and from a bed to a chair (including a wheelchair)?: A Little Help needed standing up from a chair using your arms (e.g., wheelchair or bedside chair)?: A Little Help needed to walk in hospital room?: A Little Help needed climbing 3-5 steps with a railing? : A Little 6 Click Score: 20    End of Session   Activity Tolerance: Patient tolerated treatment well Patient left: in chair;with call bell/phone within reach Nurse Communication: Mobility status PT Visit Diagnosis: Difficulty in walking, not elsewhere classified (R26.2)    Time: 2423-5361 PT Time Calculation (min)  (ACUTE ONLY): 24 min   Charges:   PT Evaluation $PT Eval Moderate Complexity: 1 Mod PT Treatments $Gait Training: 8-22 mins       Blondell Reveal Kistler PT 03/26/2022  Acute Rehabilitation Services  Office [phone]

## 2022-03-27 DIAGNOSIS — F17210 Nicotine dependence, cigarettes, uncomplicated: Secondary | ICD-10-CM | POA: Diagnosis present

## 2022-03-27 DIAGNOSIS — J208 Acute bronchitis due to other specified organisms: Secondary | ICD-10-CM | POA: Diagnosis present

## 2022-03-27 DIAGNOSIS — J9600 Acute respiratory failure, unspecified whether with hypoxia or hypercapnia: Secondary | ICD-10-CM | POA: Diagnosis not present

## 2022-03-27 DIAGNOSIS — J438 Other emphysema: Secondary | ICD-10-CM | POA: Diagnosis present

## 2022-03-27 DIAGNOSIS — F39 Unspecified mood [affective] disorder: Secondary | ICD-10-CM | POA: Diagnosis present

## 2022-03-27 DIAGNOSIS — U071 COVID-19: Secondary | ICD-10-CM | POA: Diagnosis present

## 2022-03-27 DIAGNOSIS — I69354 Hemiplegia and hemiparesis following cerebral infarction affecting left non-dominant side: Secondary | ICD-10-CM | POA: Diagnosis not present

## 2022-03-27 DIAGNOSIS — J9601 Acute respiratory failure with hypoxia: Secondary | ICD-10-CM | POA: Diagnosis present

## 2022-03-27 DIAGNOSIS — Z720 Tobacco use: Secondary | ICD-10-CM

## 2022-03-27 DIAGNOSIS — Z79899 Other long term (current) drug therapy: Secondary | ICD-10-CM | POA: Diagnosis not present

## 2022-03-27 DIAGNOSIS — I10 Essential (primary) hypertension: Secondary | ICD-10-CM | POA: Diagnosis present

## 2022-03-27 DIAGNOSIS — E7800 Pure hypercholesterolemia, unspecified: Secondary | ICD-10-CM | POA: Diagnosis present

## 2022-03-27 DIAGNOSIS — Z7902 Long term (current) use of antithrombotics/antiplatelets: Secondary | ICD-10-CM | POA: Diagnosis not present

## 2022-03-27 LAB — CBC WITH DIFFERENTIAL/PLATELET
Abs Immature Granulocytes: 0.01 10*3/uL (ref 0.00–0.07)
Basophils Absolute: 0 10*3/uL (ref 0.0–0.1)
Basophils Relative: 0 %
Eosinophils Absolute: 0 10*3/uL (ref 0.0–0.5)
Eosinophils Relative: 0 %
HCT: 44.6 % (ref 39.0–52.0)
Hemoglobin: 14.9 g/dL (ref 13.0–17.0)
Immature Granulocytes: 0 %
Lymphocytes Relative: 16 %
Lymphs Abs: 0.8 10*3/uL (ref 0.7–4.0)
MCH: 30.6 pg (ref 26.0–34.0)
MCHC: 33.4 g/dL (ref 30.0–36.0)
MCV: 91.6 fL (ref 80.0–100.0)
Monocytes Absolute: 0.4 10*3/uL (ref 0.1–1.0)
Monocytes Relative: 8 %
Neutro Abs: 3.6 10*3/uL (ref 1.7–7.7)
Neutrophils Relative %: 76 %
Platelets: 363 10*3/uL (ref 150–400)
RBC: 4.87 MIL/uL (ref 4.22–5.81)
RDW: 13.8 % (ref 11.5–15.5)
WBC: 4.8 10*3/uL (ref 4.0–10.5)
nRBC: 0 % (ref 0.0–0.2)

## 2022-03-27 LAB — COMPREHENSIVE METABOLIC PANEL
ALT: 26 U/L (ref 0–44)
AST: 16 U/L (ref 15–41)
Albumin: 3.8 g/dL (ref 3.5–5.0)
Alkaline Phosphatase: 73 U/L (ref 38–126)
Anion gap: 11 (ref 5–15)
BUN: 15 mg/dL (ref 8–23)
CO2: 25 mmol/L (ref 22–32)
Calcium: 8.9 mg/dL (ref 8.9–10.3)
Chloride: 98 mmol/L (ref 98–111)
Creatinine, Ser: 0.82 mg/dL (ref 0.61–1.24)
GFR, Estimated: >60 mL/min (ref >60)
Glucose, Bld: 139 mg/dL — ABNORMAL HIGH (ref 70–99)
Potassium: 3.8 mmol/L (ref 3.5–5.1)
Sodium: 134 mmol/L — ABNORMAL LOW (ref 135–145)
Total Bilirubin: 0.7 mg/dL (ref 0.3–1.2)
Total Protein: 7.7 g/dL (ref 6.5–8.1)

## 2022-03-27 LAB — C-REACTIVE PROTEIN: CRP: 0.7 mg/dL (ref <1.0)

## 2022-03-27 LAB — FERRITIN: Ferritin: 224 ng/mL (ref 24–336)

## 2022-03-27 MED ORDER — HYDRALAZINE HCL 20 MG/ML IJ SOLN
5.0000 mg | INTRAMUSCULAR | Status: DC | PRN
  Administered 2022-03-27 – 2022-03-28 (×2): 5 mg via INTRAVENOUS
  Filled 2022-03-27 (×2): qty 1

## 2022-03-27 MED ORDER — LACTATED RINGERS IV SOLN: INTRAVENOUS | Status: DC

## 2022-03-27 MED ORDER — NIRMATRELVIR/RITONAVIR (PAXLOVID)TABLET
3.0000 | ORAL_TABLET | Freq: Two times a day (BID) | ORAL | Status: DC
  Administered 2022-03-27 – 2022-03-30 (×7): 3 via ORAL
  Filled 2022-03-27: qty 30

## 2022-03-27 NOTE — Evaluation (Signed)
Occupational Therapy Evaluation Patient Details Name: Adrian Rodgers MRN: [id number] DOB: 14-Feb-1941 Today's Date: 03/27/2022   History of Present Illness 81 year old male presenting with acute respiratory failure due to COVID-19. PMH: mood disorder, CVA with left-sided weakness September 2022, hypertension, hyperlipidemia, emphysema.   Clinical Impression   [name] is an 81 year old male with above medical history. Prior to hospitalization patient was Mod I for all ADLs/IADLs and lives alone. Patient now presents with generalized weakness. Patient supervision for supine to sit, sit to stand and ambulation in room with walker. Patient donn socks with min guard. O2 sats were monitored during session. 92-94 at rest and 89 with activity, on 2L. Patient supervision/min guard for all ADLs required no physical assist, but needed assist for oxygen line and IV pole. Patient does not have Acute care OT needs.Recommend mobilizing with other services.        Recommendations for follow up therapy are one component of a multi-disciplinary discharge planning process, led by the attending physician.  Recommendations may be updated based on patient status, additional functional criteria and insurance authorization.   Follow Up Recommendations  No OT follow up    Assistance Recommended at Discharge PRN  Patient can return home with the following Assistance with cooking/housework;Assist for transportation;Help with stairs or ramp for entrance    Functional Status Assessment  Patient has had a recent decline in their functional status and demonstrates the ability to make significant improvements in function in a reasonable and predictable amount of time.  Equipment Recommendations  None recommended by OT    Recommendations for Other Services       Precautions / Restrictions Precautions Precautions: Other (comment) Precaution Comments: monitor O2 Restrictions Weight Bearing Restrictions:  No      Mobility Bed Mobility Overal bed mobility: Needs Assistance Bed Mobility: Supine to Sit     Supine to sit: Supervision          Transfers Overall transfer level: Needs assistance Equipment used: Rolling walker (2 wheels) Transfers: Sit to/from Stand Sit to Stand: Supervision                  Balance Overall balance assessment: No apparent balance deficits (not formally assessed)                                         ADL either performed or assessed with clinical judgement   ADL Overall ADL's : Needs assistance/impaired Eating/Feeding: Sitting;Independent   Grooming: Sitting;Independent   Upper Body Bathing: Supervision/ safety;Sitting   Lower Body Bathing: Sit to/from stand;Min guard   Upper Body Dressing : Sitting;Supervision/safety   Lower Body Dressing: Sit to/from stand;Min guard   Toilet Transfer: Rolling walker (2 wheels);Regular Toilet;Min guard   Toileting- Clothing Manipulation and Hygiene: Sit to/from stand;Min guard       Functional mobility during ADLs: Min guard General ADL Comments: min guard with walker.     Vision   Vision Assessment?: No apparent visual deficits     Perception     Praxis      Pertinent Vitals/Pain Pain Assessment Pain Assessment: Faces Faces Pain Scale: No hurt     Hand Dominance Right   Extremity/Trunk Assessment Upper Extremity Assessment Upper Extremity Assessment: RUE deficits/detail;LUE deficits/detail RUE Deficits / Details: WFL for ROM; 4/5 shoulder; 5/5 elbow RUE Sensation: WNL RUE Coordination: WNL LUE Deficits / Details: WFL for ROM;  5/5 shoulder; 5/5 elbow LUE Sensation: WNL LUE Coordination: WNL   Lower Extremity Assessment Lower Extremity Assessment: Defer to PT evaluation   Cervical / Trunk Assessment Cervical / Trunk Assessment: Normal   Communication Communication Communication: No difficulties   Cognition Arousal/Alertness: Awake/alert Behavior  During Therapy: WFL for tasks assessed/performed Overall Cognitive Status: Within Functional Limits for tasks assessed                                       General Comments       Exercises     Shoulder Instructions      Home Living Family/patient expects to be discharged to:: Private residence Living Arrangements: Alone Available Help at Discharge: Family;Available PRN/intermittently Type of Home: House Home Access: Stairs to enter [employer] of Steps: 4 Entrance Stairs-Rails: Right Home Layout: One level     Bathroom Shower/Tub: Tub/shower unit         Home Equipment: Conservation officer, nature (2 wheels);Rollator (4 wheels);Cane - single point;Grab bars - tub/shower   Additional Comments: daughter lives close by and can assist as needed      Prior Functioning/Environment Prior Level of Function : Independent/Modified Independent;Driving             Mobility Comments: drives, walks with RW or SPC, no falls in pas 6 months ADLs Comments: Mod I        OT Problem List:        OT Treatment/Interventions:      OT Goals(Current goals can be found in the care plan section) Acute Rehab OT Goals OT Goal Formulation: All assessment and education complete, DC therapy  OT Frequency:      Co-evaluation              AM-PAC OT "6 Clicks" Daily Activity     Outcome Measure Help from another person eating meals?: None Help from another person taking care of personal grooming?: None Help from another person toileting, which includes using toliet, bedpan, or urinal?: A Little Help from another person bathing (including washing, rinsing, drying)?: A Little Help from another person to put on and taking off regular upper body clothing?: A Little Help from another person to put on and taking off regular lower body clothing?: A Little 6 Click Score: 20   End of Session Equipment Utilized During Treatment: Rolling walker (2 wheels) Nurse  Communication: Mobility status (patient did not require assistance for mobility.)  Activity Tolerance: Patient tolerated treatment well Patient left: in chair;with call bell/phone within reach  OT Visit Diagnosis: Muscle weakness (generalized) (M62.81)                Time: [id number] OT Time Calculation (min): 17 min Charges:  OT General Charges $OT Visit: 1 Visit OT Evaluation $OT Eval Low Complexity: 1 Low  Charlann Lange, OTS Acute rehab services   Charlann Lange 03/27/2022, 5:37 PM

## 2022-03-27 NOTE — Assessment & Plan Note (Signed)
-   Continue home bupropion

## 2022-03-27 NOTE — Hospital Course (Addendum)
Taken from prior notes.  Patient is a 81 year old male with history of mood disorder, CVA with left-sided weakness, hypertension, hyperlipidemia, emphysema who presented from home with 2 days history of progressive weakness. He lives alone. Also complained of chills, body aches, lightheadedness, cough, intermittent shortness of breath on exertion.  On presentation ,he was hypoxic on room air requiring 2 L.  Labs showed a creatinine of 1.29, slightly up from baseline.  COVID screen test was positive.  CRP of 1.1.  Chest imaging did not show pneumonia.  Patient was started on steroids, molnupiravir.  9/27: Hemodynamically stable.  Weaned off to room air. Unable to tolerate molnupiravir as it was causing nausea. CRP at 0.7 Rest of the labs stable. Molnupiravir was history of Paxlovid, he will hold his Lipitor until 3 days after finishing Paxlovid.  Patient continued to have quite nausea and vomiting. Desaturating to 87 on room air requiring up to 2 L of oxygen.

## 2022-03-27 NOTE — Assessment & Plan Note (Signed)
Not on home oxygen. -See above

## 2022-03-27 NOTE — Assessment & Plan Note (Signed)
Blood pressure elevated. -Continue with home losartan -Adding as needed hydralazine

## 2022-03-27 NOTE — Assessment & Plan Note (Signed)
-   Holding home Lipitor while patient is on Paxlovid

## 2022-03-27 NOTE — Assessment & Plan Note (Addendum)
-   Continue nicotine patch -Counseling was provided

## 2022-03-27 NOTE — Assessment & Plan Note (Signed)
Patient has residual left-sided weakness. -Continue home Plavix -Hold home Lipitor until 3 days after finishing Paxlovid

## 2022-03-27 NOTE — Assessment & Plan Note (Signed)
-  PT/OT evaluation 

## 2022-03-27 NOTE — Assessment & Plan Note (Signed)
No baseline oxygen use, currently requiring 2 L of oxygen.  Chest x-ray with no acute abnormality  Unable to tolerate molnupiravir. CRP improving. -Switch to Paxlovid and see if he can tolerate that -Hold Lipitor while he is on Paxlovid. -Continue with supplemental oxygen-wean as tolerated. -Continue with supportive care

## 2022-03-27 NOTE — Progress Notes (Signed)
Progress Note   Patient: Adrian Rodgers WNU:[id number] DOB: [birth year] DOA: 03/25/2022     0 DOS: the patient was seen and examined on 03/27/2022   Brief hospital course: Taken from prior notes.  Patient is a 81 year old male with history of mood disorder, CVA with left-sided weakness, hypertension, hyperlipidemia, emphysema who presented from home with 2 days history of progressive weakness. He lives alone. Also complained of chills, body aches, lightheadedness, cough, intermittent shortness of breath on exertion.  On presentation ,he was hypoxic on room air requiring 2 L.  Labs showed a creatinine of 1.29, slightly up from baseline.  COVID screen test was positive.  CRP of 1.1.  Chest imaging did not show pneumonia.  Patient was started on steroids, molnupiravir.  9/27: Hemodynamically stable.  Weaned off to room air. Unable to tolerate molnupiravir as it was causing nausea. CRP at 0.7 Rest of the labs stable. Molnupiravir was history of Paxlovid, he will hold his Lipitor until 3 days after finishing Paxlovid.  Patient continued to have quite nausea and vomiting. Desaturating to 87 on room air requiring up to 2 L of oxygen.   Assessment and Plan: * Acute respiratory failure due to COVID-19 (HCC) No baseline oxygen use, currently requiring 2 L of oxygen.  Chest x-ray with no acute abnormality  Unable to tolerate molnupiravir. CRP improving. -Switch to Paxlovid and see if he can tolerate that -Hold Lipitor while he is on Paxlovid. -Continue with supplemental oxygen-wean as tolerated. -Continue with supportive care  Other emphysema (HCC) Not on home oxygen. -See above  History of CVA (cerebrovascular accident) Patient has residual left-sided weakness. -Continue home Plavix -Hold home Lipitor until 3 days after finishing Paxlovid  Essential hypertension Blood pressure elevated. -Continue with home losartan -Adding as needed hydralazine  HLD (hyperlipidemia) - Holding home  Lipitor while patient is on Paxlovid  Mood disorder (HCC) - Continue home bupropion  Tobacco abuse - Continue nicotine patch -Counseling was provided  Generalized weakness - PT/OT evaluation   Subjective: Patient continued to have nausea, unable to tolerate molnupiravir stating that it is making him more nauseated.  Appetite remained very poor.  Physical Exam: Vitals:   03/26/22 2029 03/26/22 2043 03/27/22 0506 03/27/22 1340  BP: (!) 175/149 (!) 160/100 (!) 153/90 (!) 164/83  Pulse: 83 70 72 66  Resp: 20  20 18   Temp: 98.8 F (37.1 C)  98.6 F (37 C) 98.6 F (37 C)  TempSrc: Oral  Oral Oral  SpO2: 92%  93% 94%  Weight:      Height:       General.  Chronically ill-appearing elderly man, in no acute distress. Pulmonary.  Lungs clear bilaterally, normal respiratory effort. CV.  Regular rate and rhythm, no JVD, rub or murmur. Abdomen.  Soft, nontender, nondistended, BS positive. CNS.  Alert and oriented .  Chronic left-sided weakness Extremities.  No edema, no cyanosis, pulses intact and symmetrical. Psychiatry.  Appears to have some cognitive impairment  Data Reviewed: Prior data reviewed  Family Communication: Discussed with daughter on phone  Disposition: Status is: Observation The patient will require care spanning > 2 midnights and should be moved to inpatient because: Severity of illness  Planned Discharge Destination: Home with Home Health  DVT prophylaxis.  Lovenox Time spent: 45 minutes  This record has been created using . Errors have been sought and corrected,but may not always be located. Such creation errors do not reflect on the standard of care.  Author: Conservation officer, historic buildings, MD  03/27/2022 2:06 PM  For on call review [email or web address].

## 2022-03-28 DIAGNOSIS — U071 COVID-19: Secondary | ICD-10-CM | POA: Diagnosis not present

## 2022-03-28 DIAGNOSIS — I10 Essential (primary) hypertension: Secondary | ICD-10-CM | POA: Diagnosis not present

## 2022-03-28 DIAGNOSIS — J9600 Acute respiratory failure, unspecified whether with hypoxia or hypercapnia: Secondary | ICD-10-CM | POA: Diagnosis not present

## 2022-03-28 LAB — COMPREHENSIVE METABOLIC PANEL
ALT: 26 U/L (ref 0–44)
AST: 14 U/L — ABNORMAL LOW (ref 15–41)
Albumin: 3.8 g/dL (ref 3.5–5.0)
Alkaline Phosphatase: 69 U/L (ref 38–126)
Anion gap: 7 (ref 5–15)
BUN: 16 mg/dL (ref 8–23)
CO2: 29 mmol/L (ref 22–32)
Calcium: 9 mg/dL (ref 8.9–10.3)
Chloride: 98 mmol/L (ref 98–111)
Creatinine, Ser: 0.66 mg/dL (ref 0.61–1.24)
GFR, Estimated: >60 mL/min (ref >60)
Glucose, Bld: 132 mg/dL — ABNORMAL HIGH (ref 70–99)
Potassium: 3.9 mmol/L (ref 3.5–5.1)
Sodium: 134 mmol/L — ABNORMAL LOW (ref 135–145)
Total Bilirubin: 0.9 mg/dL (ref 0.3–1.2)
Total Protein: 7.5 g/dL (ref 6.5–8.1)

## 2022-03-28 LAB — CBC WITH DIFFERENTIAL/PLATELET
Abs Immature Granulocytes: 0.05 10*3/uL (ref 0.00–0.07)
Basophils Absolute: 0 10*3/uL (ref 0.0–0.1)
Basophils Relative: 0 %
Eosinophils Absolute: 0 10*3/uL (ref 0.0–0.5)
Eosinophils Relative: 0 %
HCT: 44.8 % (ref 39.0–52.0)
Hemoglobin: 14.8 g/dL (ref 13.0–17.0)
Immature Granulocytes: 1 %
Lymphocytes Relative: 13 %
Lymphs Abs: 0.6 10*3/uL — ABNORMAL LOW (ref 0.7–4.0)
MCH: 30.3 pg (ref 26.0–34.0)
MCHC: 33 g/dL (ref 30.0–36.0)
MCV: 91.8 fL (ref 80.0–100.0)
Monocytes Absolute: 0.5 10*3/uL (ref 0.1–1.0)
Monocytes Relative: 9 %
Neutro Abs: 3.8 10*3/uL (ref 1.7–7.7)
Neutrophils Relative %: 77 %
Platelets: 363 10*3/uL (ref 150–400)
RBC: 4.88 MIL/uL (ref 4.22–5.81)
RDW: 13.6 % (ref 11.5–15.5)
WBC: 5 10*3/uL (ref 4.0–10.5)
nRBC: 0 % (ref 0.0–0.2)

## 2022-03-28 LAB — C-REACTIVE PROTEIN: CRP: 0.5 mg/dL (ref <1.0)

## 2022-03-28 LAB — FERRITIN: Ferritin: 217 ng/mL (ref 24–336)

## 2022-03-28 MED ORDER — MOMETASONE FURO-FORMOTEROL FUM 100-5 MCG/ACT IN AERO
2.0000 | INHALATION_SPRAY | Freq: Two times a day (BID) | RESPIRATORY_TRACT | Status: DC
  Administered 2022-03-28 – 2022-03-30 (×5): 2 via RESPIRATORY_TRACT
  Filled 2022-03-28: qty 8.8

## 2022-03-28 MED ORDER — LOSARTAN POTASSIUM 50 MG PO TABS
100.0000 mg | ORAL_TABLET | Freq: Every day | ORAL | Status: DC
  Administered 2022-03-29 – 2022-03-30 (×2): 100 mg via ORAL
  Filled 2022-03-28 (×2): qty 2

## 2022-03-28 MED ORDER — PREDNISONE 20 MG PO TABS
40.0000 mg | ORAL_TABLET | Freq: Every day | ORAL | Status: DC
  Administered 2022-03-29 – 2022-03-30 (×2): 40 mg via ORAL
  Filled 2022-03-28 (×2): qty 2

## 2022-03-28 MED ORDER — IPRATROPIUM-ALBUTEROL 20-100 MCG/ACT IN AERS
2.0000 | INHALATION_SPRAY | Freq: Four times a day (QID) | RESPIRATORY_TRACT | Status: DC
  Administered 2022-03-28 – 2022-03-29 (×5): 2 via RESPIRATORY_TRACT
  Filled 2022-03-28: qty 4

## 2022-03-28 NOTE — TOC Progression Note (Addendum)
Transition of Care [facility]) - Progression Note    Patient Details  Name: Adrian Rodgers MRN: [id number] Date of Birth: 1941-03-04  Transition of Care [facility]) CM/SW Contact  Stillman Buenger, Juliann Pulse, RN Phone Number: 03/28/2022, 12:17 PM  Clinical Narrative:Checking w/[facility] since patient used in past rep amy for HHPT-await if able to accept. Currently on 02-will continue to monitor-[facility] following if home 02 needed-await 02 sats.  -12:43p-[facility] accepted for HHPT rep Amy.      Expected Discharge Plan: [place] Barriers to Discharge: Continued Medical Work up  Expected Discharge Plan and Services Expected Discharge Plan: [place]   Discharge Planning Services: CM Consult   Living arrangements for the past 2 months: Single Family Home                                       Social Determinants of Health (SDOH) Interventions    Readmission Risk Interventions     No data to display

## 2022-03-28 NOTE — Progress Notes (Signed)
Physical Therapy Treatment Patient Details Name: Adrian Rodgers MRN: [id number] DOB: 06/21/1941 Today's Date: 03/28/2022   History of Present Illness 81 year old male presenting with acute respiratory failure due to COVID-19. PMH: mood disorder, CVA with left-sided weakness September 2022, hypertension, hyperlipidemia, emphysema.    PT Comments    AxO x 3 very pleasant and willing to walk.  Currently in recliner on 2 lts sats 96%.  Assisted with amb to bathroom as well as in hallway with trial RA.  Pt also present with 2/4 dyspnea, required a seated rest break between activities.  SATURATION QUALIFICATIONS: (This note is used to comply with regulatory documentation for home oxygen)  Patient Saturations on Room Air at Rest = 90%  Patient Saturations on Room Air while Ambulating 45 feet= 84%  Patient Saturations on 2 Liters of oxygen while Ambulating = 88%  Please briefly explain why patient needs home oxygen:  Pt does require supplemental oxygen to achieve therapeutic levels esp with activity.  Pt plans to return home with family support.    Recommendations for follow up therapy are one component of a multi-disciplinary discharge planning process, led by the attending physician.  Recommendations may be updated based on patient status, additional functional criteria and insurance authorization.  Follow Up Recommendations  Home health PT     Assistance Recommended at Discharge Intermittent Supervision/Assistance  Patient can return home with the following Assistance with cooking/housework;Assist for transportation;Help with stairs or ramp for entrance   Equipment Recommendations  None recommended by PT    Recommendations for Other Services       Precautions / Restrictions Precautions Precaution Comments: Old CVA LEFT hemi, monitor O2 Restrictions Weight Bearing Restrictions: No     Mobility  Bed Mobility               General bed mobility comments: OOB in  recliner    Transfers Overall transfer level: Needs assistance Equipment used: Rolling walker (2 wheels), None Transfers: Sit to/from Stand Sit to Stand: Supervision, Min guard           General transfer comment: VCs hand placement and safety with turns.   Also assisted with a toilet transfer.    Ambulation/Gait Ambulation/Gait assistance: Supervision, Min guard Gait Distance (Feet): 45 Feet Assistive device: Rolling walker (2 wheels), None Gait Pattern/deviations: Step-through pattern, Decreased stride length Gait velocity: decr     General Gait Details: First amb to and from bathroom without AD light  "furniture walking" then amb in hallway with walker.  RA decreased to 84%.   Stairs             Wheelchair Mobility    Modified Rankin (Stroke Patients Only)       Balance                                            Cognition Arousal/Alertness: Awake/alert Behavior During Therapy: WFL for tasks assessed/performed Overall Cognitive Status: Within Functional Limits for tasks assessed                                 General Comments: AxO x 3 very pleasant and willing        Exercises      General Comments        Pertinent Vitals/Pain Pain Assessment Pain Assessment: No/denies pain  Home Living                          Prior Function            PT Goals (current goals can now be found in the care plan section) Progress towards PT goals: Progressing toward goals    Frequency    Min 3X/week      PT Plan Current plan remains appropriate    Co-evaluation              AM-PAC PT "6 Clicks" Mobility   Outcome Measure  Help needed turning from your back to your side while in a flat bed without using bedrails?: A Little Help needed moving from lying on your back to sitting on the side of a flat bed without using bedrails?: A Little Help needed moving to and from a bed to a chair (including  a wheelchair)?: A Little Help needed standing up from a chair using your arms (e.g., wheelchair or bedside chair)?: A Little Help needed to walk in hospital room?: A Little Help needed climbing 3-5 steps with a railing? : A Little 6 Click Score: 18    End of Session Equipment Utilized During Treatment: Gait belt Activity Tolerance: Patient tolerated treatment well Patient left: in chair;with call bell/phone within reach;with chair alarm set Nurse Communication: Mobility status PT Visit Diagnosis: Difficulty in walking, not elsewhere classified (R26.2)     Time: 2703-5009 PT Time Calculation (min) (ACUTE ONLY): 27 min  Charges:  $Gait Training: 8-22 mins $Therapeutic Activity: 8-22 mins                     Rica Koyanagi  PTA [place] Office M-F          [phone] Weekend pager [phone]

## 2022-03-28 NOTE — Progress Notes (Signed)
[facility] PROGRESS NOTE   Adrian Rodgers PJA:[id number] DOB: 1940-09-12 DOA: 03/25/2022  PCP: Elsie Amis, MD  Brief History/Interval Summary: 81 year old male with history of mood disorder, CVA with left-sided weakness, hypertension, hyperlipidemia, emphysema who presented from home with 2 days history of progressive weakness, chills, body aches, lightheadedness, cough, intermittent shortness of breath on exertion.  On presentation ,he was hypoxic on room air requiring 2 L.  COVID screen test was positive.  CRP of 1.1.  Chest imaging did not show pneumonia.  Patient was started on steroids, molnupiravir.   Consultants: None   Procedures: None yet    Subjective/Interval History: Patient mentions that her shortness of breath has improved.  Denies any chest pain.  No lightheadedness.  No nausea or vomiting.    Assessment/Plan:  COVID-19 infection/acute respiratory failure with hypoxia Does not use oxygen at baseline.  Found to be positive for COVID-19.  Chest x-ray did not show any pneumonia.  Inflammatory markers were only minimally elevated. Patient was started on molnupiravir which he was not able to tolerate.  He was switched over to Paxlovid.  Denies any nausea with the Paxlovid so far. He has been weaned off of oxygen.  Continue with incentive spirometry.  Concern for COPD Patient smokes cigarettes.  No wheezing appreciated on examination.  Will be initiated on Dulera since there could be a component of COPD which could have caused his hypoxia.  He was counseled to stop smoking cigarettes.  History of stroke Has residual left-sided weakness.  Continue Plavix.  Holding Lipitor until 3 days after he has finished course of Paxlovid.  Essential hypertension Losartan being continued.  Blood pressure noted to be poorly controlled which could be due to steroids.  Use as needed medications.  May need additional oral medications.  Could also consider increasing  the dose of his losartan.  Hyperlipidemia Lipitor on hold while the patient is on Paxlovid.  Mood disorder Continue bupropion  Tobacco abuse Nicotine patch.  Counseling was provided.  Physical deconditioning Seen by PT.  Home health is recommended.  DVT Prophylaxis: Lovenox Code Status: Full code Family Communication: Discussed with patient Disposition Plan: Hopefully return home in 24 to 48 hours  Status is: Inpatient Remains inpatient appropriate because: Shortness of breath, COVID-19 infection      Medications: Scheduled:  vitamin C  500 mg Oral Daily   buPROPion  150 mg Oral Daily   clopidogrel  75 mg Oral Daily   dexamethasone (DECADRON) injection  6 mg Intravenous Q24H   enoxaparin (LOVENOX) injection  40 mg Subcutaneous Q24H   Ipratropium-Albuterol  2 puff Inhalation QID   losartan  50 mg Oral Daily   mometasone-formoterol  2 puff Inhalation BID   multivitamin with minerals  1 tablet Oral Q breakfast   nicotine  14 mg Transdermal Daily   nirmatrelvir/ritonavir EUA  3 tablet Oral BID   sodium chloride flush  3 mL Intravenous Q12H   zinc sulfate  220 mg Oral Daily   Continuous:  lactated ringers 75 mL/hr at 03/28/22 0143   HAL:[id number], albuterol, chlorpheniramine-HYDROcodone, guaiFENesin-dextromethorphan, hydrALAZINE, ondansetron (ZOFRAN) IV, polyethylene glycol  Antibiotics: Anti-infectives (From admission, onward)    Start     Dose/Rate Route Frequency Ordered Stop   03/27/22 1000  nirmatrelvir/ritonavir EUA (PAXLOVID) 3 tablet        3 tablet Oral 2 times daily 03/27/22 0840 04/01/22 0959   03/25/22 2200  molnupiravir EUA (LAGEVRIO) capsule 800 mg  Status:  Discontinued  4 capsule Oral 2 times daily 03/25/22 1750 03/27/22 0840       Objective:  Vital Signs  Vitals:   03/27/22 1340 03/27/22 1635 03/27/22 2256 03/28/22 0600  BP: (!) 164/83 (!) 143/86 (!) 148/84 (!) 175/93  Pulse: 66 80 74 61  Resp: 18  18 20   Temp: 98.6 F (37 C)   99 F (37.2 C) 98.6 F (37 C)  TempSrc: Oral  Oral Oral  SpO2: 94%  97% 95%  Weight:      Height:        Intake/Output Summary (Last 24 hours) at 03/28/2022 1123 Last data filed at 03/28/2022 0050 Gross per 24 hour  Intake 570.75 ml  Output --  Net 570.75 ml   Filed Weights   03/25/22 1436 03/25/22 2039  Weight: 81.6 kg 81.9 kg    General appearance: Awake alert.  In no distress Resp: Mildly tachypneic.  No use of accessory muscles.  Coarse breath sounds bilaterally.  Few crackles at the bases.  No wheezing or rhonchi. Cardio: S1-S2 is normal regular.  No S3-S4.  No rubs murmurs or bruit GI: Abdomen is soft.  Nontender nondistended.  Bowel sounds are present normal.  No masses organomegaly Extremities: No edema.  Full range of motion of lower extremities. Neurologic: Alert and oriented x3.  No focal neurological deficits.    Lab Results:  Data Reviewed: I have personally reviewed following labs and reports of the imaging studies  CBC: Recent Labs  Lab 03/25/22 1443 03/26/22 0521 03/27/22 0512 03/28/22 0521  WBC 5.0 3.3* 4.8 5.0  NEUTROABS 3.2 2.3 3.6 3.8  HGB 13.8 14.0 14.9 14.8  HCT 42.2 43.3 44.6 44.8  MCV 92.1 95.0 91.6 91.8  PLT 357 351 363 973    Basic Metabolic Panel: Recent Labs  Lab 03/25/22 1443 03/26/22 0521 03/27/22 0512 03/28/22 0521  NA 135 141 134* 134*  K 3.7 4.0 3.8 3.9  CL 100 105 98 98  CO2 25 25 25 29   GLUCOSE 93 140* 139* 132*  BUN 14 15 15 16   CREATININE 1.29* 0.89 0.82 0.66  CALCIUM 8.6* 8.8* 8.9 9.0    GFR: Estimated Creatinine Clearance: 71.3 mL/min (by C-G formula based on SCr of 0.66 mg/dL).  Liver Function Tests: Recent Labs  Lab 03/25/22 1539 03/26/22 0521 03/27/22 0512 03/28/22 0521  AST 17 15 16  14*  ALT 26 24 26 26   ALKPHOS 77 70 73 69  BILITOT 0.9 0.7 0.7 0.9  PROT 7.1 6.8 7.7 7.5  ALBUMIN 3.8 3.4* 3.8 3.8     Anemia Panel: Recent Labs    03/27/22 0512 03/28/22 0521  FERRITIN 224 217    Recent  Results (from the past 240 hour(s))  SARS Coronavirus 2 by RT PCR (hospital order, performed in [place] hospital lab) *cepheid single result test* Anterior Nasal Swab     Status: Abnormal   Collection Time: 03/25/22  2:42 PM   Specimen: Anterior Nasal Swab  Result Value Ref Range Status   SARS Coronavirus 2 by RT PCR POSITIVE (A) NEGATIVE Final    Comment: (NOTE) SARS-CoV-2 target nucleic acids are DETECTED  SARS-CoV-2 RNA is generally detectable in upper respiratory specimens  during the acute phase of infection.  Positive results are indicative  of the presence of the identified virus, but do not rule out bacterial infection or co-infection with other pathogens not detected by the test.  Clinical correlation with patient history and  other diagnostic information is necessary to determine patient  infection status.  The expected result is negative.  Fact Sheet for Patients:   RoadLapTop.co.za   Fact Sheet for Healthcare Providers:   http://kim-miller.com/    This test is not yet approved or cleared by the [place] FDA and  has been authorized for detection and/or diagnosis of SARS-CoV-2 by FDA under an Emergency Use Authorization (EUA).  This EUA will remain in effect (meaning this test can be used) for the duration of  the COVID-19 declaration under Section 564(b)(1)  of the Act, 21 U.S.C. section 360-bbb-3(b)(1), unless the authorization is terminated or revoked sooner.   Performed at [facility], [address] [place], [place] [address]   Blood Culture (routine x 2)     Status: None (Preliminary result)   Collection Time: 03/25/22  6:01 PM   Specimen: BLOOD RIGHT FOREARM  Result Value Ref Range Status   Specimen Description   Final    BLOOD RIGHT FOREARM Performed at [facility], [address] [place], [place] [address]    Special Requests   Final    BOTTLES DRAWN AEROBIC AND ANAEROBIC  Blood Culture results may not be optimal due to an excessive volume of blood received in culture bottles Performed at [facility], [address] [place], [place] [address]    Culture   Final    NO GROWTH 3 DAYS Performed at [facility], [address], [place], [place] [address]    Report Status PENDING  Incomplete  Blood Culture (routine x 2)     Status: None (Preliminary result)   Collection Time: 03/25/22  6:05 PM   Specimen: Left Antecubital; Blood  Result Value Ref Range Status   Specimen Description   Final    LEFT ANTECUBITAL Performed at [facility], [address] [place], [place] [address]    Special Requests   Final    BOTTLES DRAWN AEROBIC AND ANAEROBIC Blood Culture adequate volume Performed at [facility], [address] [place], [place] [address]    Culture   Final    NO GROWTH 3 DAYS Performed at [facility], [address], [place], [place] [address]    Report Status PENDING  Incomplete      Radiology Studies: No results found.     LOS: 1 day   Jaeley Wiker [employer] on [email or web address]  03/28/2022, 11:23 AM

## 2022-03-29 ENCOUNTER — Inpatient Hospital Stay (HOSPITAL_COMMUNITY): Payer: Medicare Other

## 2022-03-29 DIAGNOSIS — J9600 Acute respiratory failure, unspecified whether with hypoxia or hypercapnia: Secondary | ICD-10-CM | POA: Diagnosis not present

## 2022-03-29 DIAGNOSIS — I10 Essential (primary) hypertension: Secondary | ICD-10-CM | POA: Diagnosis not present

## 2022-03-29 DIAGNOSIS — U071 COVID-19: Secondary | ICD-10-CM | POA: Diagnosis not present

## 2022-03-29 MED ORDER — NIRMATRELVIR/RITONAVIR (PAXLOVID)TABLET: 3.0000 | ORAL_TABLET | Freq: Two times a day (BID) | ORAL | 0 refills | Status: AC

## 2022-03-29 MED ORDER — LOSARTAN POTASSIUM 50 MG PO TABS: 100.0000 mg | ORAL_TABLET | Freq: Every day | ORAL | 1 refills | Status: DC

## 2022-03-29 MED ORDER — PREDNISONE 20 MG PO TABS: ORAL_TABLET | ORAL | 0 refills | Status: DC

## 2022-03-29 MED ORDER — ATORVASTATIN CALCIUM 80 MG PO TABS: 80.0000 mg | ORAL_TABLET | Freq: Every day | ORAL | Status: AC

## 2022-03-29 MED ORDER — IOHEXOL 300 MG/ML  SOLN
75.0000 mL | Freq: Once | INTRAMUSCULAR | Status: AC | PRN
  Administered 2022-03-29: 75 mL via INTRAVENOUS

## 2022-03-29 MED ORDER — ORAL CARE MOUTH RINSE: 15.0000 mL | OROMUCOSAL | Status: DC | PRN

## 2022-03-29 MED ORDER — METOCLOPRAMIDE HCL 5 MG/ML IJ SOLN
5.0000 mg | Freq: Once | INTRAMUSCULAR | Status: AC
  Administered 2022-03-29: 5 mg via INTRAVENOUS
  Filled 2022-03-29: qty 2

## 2022-03-29 MED ORDER — IPRATROPIUM-ALBUTEROL 20-100 MCG/ACT IN AERS
2.0000 | INHALATION_SPRAY | Freq: Two times a day (BID) | RESPIRATORY_TRACT | Status: DC
  Administered 2022-03-29 – 2022-03-30 (×2): 2 via RESPIRATORY_TRACT

## 2022-03-29 MED ORDER — NIRMATRELVIR/RITONAVIR (PAXLOVID)TABLET: 3.0000 | ORAL_TABLET | Freq: Two times a day (BID) | ORAL | 0 refills | Status: DC

## 2022-03-29 MED ORDER — MOMETASONE FURO-FORMOTEROL FUM 100-5 MCG/ACT IN AERO: 2.0000 | INHALATION_SPRAY | Freq: Two times a day (BID) | RESPIRATORY_TRACT | 2 refills | Status: DC

## 2022-03-29 NOTE — Progress Notes (Signed)
[facility] PROGRESS NOTE   Adrian Rodgers RFF:[id number] DOB: July 30, 1940 DOA: 03/25/2022  PCP: Elsie Amis, MD  Brief History/Interval Summary: 81 year old male with history of mood disorder, CVA with left-sided weakness, hypertension, hyperlipidemia, emphysema who presented from home with 2 days history of progressive weakness, chills, body aches, lightheadedness, cough, intermittent shortness of breath on exertion.  On presentation ,he was hypoxic on room air requiring 2 L.  COVID screen test was positive.  CRP of 1.1.  Chest imaging did not show pneumonia.  Patient was started on steroids, molnupiravir.   Consultants: None   Procedures: None yet    Subjective/Interval History: Patient mentions that his shortness of breath is slowly improving.  Denies any chest pain.  No lightheadedness.  No nausea or vomiting.      Assessment/Plan:  COVID-19 infection/acute respiratory failure with hypoxia Does not use oxygen at baseline.  Found to be positive for COVID-19.  Chest x-ray did not show any pneumonia.  Inflammatory markers were only minimally elevated. Patient was started on molnupiravir which he was not able to tolerate.  He was switched over to Paxlovid.  Denies any nausea with the Paxlovid so far. Continues to require oxygen.  He was not able to be weaned off of oxygen yesterday.  He will need O2 at home.  COPD Patient smokes cigarettes.  No wheezing appreciated on examination.   Due to persistent hypoxia CT chest was done which did not show any PE but did raise concern for COPD.  This was communicated to patient's daughter. He has been started on [facility].  Will be sent home on steroid taper.  Primary care provider to consider referral to pulmonology.  Tobacco cessation discussed with patient and his daughter.    History of stroke Has residual left-sided weakness.  Continue Plavix.  Holding Lipitor until 3 days after he has finished course of  Paxlovid.  Essential hypertension Dose of losartan was increased.  Steroid could be contributing to elevated blood pressure readings.  Hyperlipidemia Lipitor on hold while the patient is on Paxlovid.  Mood disorder Continue bupropion  Tobacco abuse Nicotine patch.  Counseling was provided.  Physical deconditioning Seen by PT.  Home health is recommended.  DVT Prophylaxis: Lovenox Code Status: Full code Family Communication: Discussed with patient Disposition Plan: Discussed with daughter.  Requesting discharge tomorrow since she will need today to set up his home.  They do not live together and he lives by himself.  Status is: Inpatient Remains inpatient appropriate because: Shortness of breath, COVID-19 infection      Medications: Scheduled:  vitamin C  500 mg Oral Daily   buPROPion  150 mg Oral Daily   clopidogrel  75 mg Oral Daily   enoxaparin (LOVENOX) injection  40 mg Subcutaneous Q24H   Ipratropium-Albuterol  2 puff Inhalation BID   losartan  100 mg Oral Daily   mometasone-formoterol  2 puff Inhalation BID   multivitamin with minerals  1 tablet Oral Q breakfast   nicotine  14 mg Transdermal Daily   nirmatrelvir/ritonavir EUA  3 tablet Oral BID   predniSONE  40 mg Oral Q breakfast   sodium chloride flush  3 mL Intravenous Q12H   zinc sulfate  220 mg Oral Daily   Continuous:   JTT:[id number], albuterol, chlorpheniramine-HYDROcodone, guaiFENesin-dextromethorphan, hydrALAZINE, ondansetron (ZOFRAN) IV, mouth rinse, polyethylene glycol  Antibiotics: Anti-infectives (From admission, onward)    Start     Dose/Rate Route Frequency Ordered Stop   03/29/22 0000  nirmatrelvir/ritonavir EUA (PAXLOVID) 20  x 150 MG & 10 x 100MG  TABS  Status:  Discontinued        3 tablet Oral 2 times daily 03/29/22 1232 03/29/22    03/29/22 0000  nirmatrelvir/ritonavir EUA (PAXLOVID) 20 x 150 MG & 10 x 100MG  TABS        3 tablet Oral 2 times daily 03/29/22 1233 04/01/22 2359    03/27/22 1000  nirmatrelvir/ritonavir EUA (PAXLOVID) 3 tablet        3 tablet Oral 2 times daily 03/27/22 0840 04/01/22 0959   03/25/22 2200  molnupiravir EUA (LAGEVRIO) capsule 800 mg  Status:  Discontinued        4 capsule Oral 2 times daily 03/25/22 1750 03/27/22 0840       Objective:  Vital Signs  Vitals:   03/28/22 1340 03/28/22 2100 03/29/22 0525 03/29/22 0832  BP: (!) 151/90 (!) 154/85 (!) 162/94   Pulse: 84 89 81   Resp: 20 18 18    Temp: 98.2 F (36.8 C) 98.7 F (37.1 C) 98.7 F (37.1 C)   TempSrc: Oral Oral Oral   SpO2: 93% 92% 94% 92%  Weight:      Height:        Intake/Output Summary (Last 24 hours) at 03/29/2022 1234 Last data filed at 03/29/2022 0028 Gross per 24 hour  Intake 480 ml  Output --  Net 480 ml    Filed Weights   03/25/22 1436 03/25/22 2039  Weight: 81.6 kg 81.9 kg    General appearance: Awake alert.  In no distress Resp: Coarse breath sounds bilaterally.  No wheezing rales or rhonchi.  Normal effort at rest. Cardio: S1-S2 is normal regular.  No S3-S4.  No rubs murmurs or bruit GI: Abdomen is soft.  Nontender nondistended.  Bowel sounds are present normal.  No masses organomegaly    Lab Results:  Data Reviewed: I have personally reviewed following labs and reports of the imaging studies  CBC: Recent Labs  Lab 03/25/22 1443 03/26/22 0521 03/27/22 0512 03/28/22 0521  WBC 5.0 3.3* 4.8 5.0  NEUTROABS 3.2 2.3 3.6 3.8  HGB 13.8 14.0 14.9 14.8  HCT 42.2 43.3 44.6 44.8  MCV 92.1 95.0 91.6 91.8  PLT 357 351 363 363     Basic Metabolic Panel: Recent Labs  Lab 03/25/22 1443 03/26/22 0521 03/27/22 0512 03/28/22 0521  NA 135 141 134* 134*  K 3.7 4.0 3.8 3.9  CL 100 105 98 98  CO2 25 25 25 29   GLUCOSE 93 140* 139* 132*  BUN 14 15 15 16   CREATININE 1.29* 0.89 0.82 0.66  CALCIUM 8.6* 8.8* 8.9 9.0     GFR: Estimated Creatinine Clearance: 71.3 mL/min (by C-G formula based on SCr of 0.66 mg/dL).  Liver Function Tests: Recent  Labs  Lab 03/25/22 1539 03/26/22 0521 03/27/22 0512 03/28/22 0521  AST 17 15 16  14*  ALT 26 24 26 26   ALKPHOS 77 70 73 69  BILITOT 0.9 0.7 0.7 0.9  PROT 7.1 6.8 7.7 7.5  ALBUMIN 3.8 3.4* 3.8 3.8      Anemia Panel: Recent Labs    03/27/22 0512 03/28/22 0521  FERRITIN 224 217     Recent Results (from the past 240 hour(s))  SARS Coronavirus 2 by RT PCR (hospital order, performed in [facility] hospital lab) *cepheid single result test* Anterior Nasal Swab     Status: Abnormal   Collection Time: 03/25/22  2:42 PM   Specimen: Anterior Nasal Swab  Result Value Ref Range Status  SARS Coronavirus 2 by RT PCR POSITIVE (A) NEGATIVE Final    Comment: (NOTE) SARS-CoV-2 target nucleic acids are DETECTED  SARS-CoV-2 RNA is generally detectable in upper respiratory specimens  during the acute phase of infection.  Positive results are indicative  of the presence of the identified virus, but do not rule out bacterial infection or co-infection with other pathogens not detected by the test.  Clinical correlation with patient history and  other diagnostic information is necessary to determine patient infection status.  The expected result is negative.  Fact Sheet for Patients:   https://www.patel.info/   Fact Sheet for Healthcare Providers:   https://hall.com/    This test is not yet approved or cleared by the [place] FDA and  has been authorized for detection and/or diagnosis of SARS-CoV-2 by FDA under an Emergency Use Authorization (EUA).  This EUA will remain in effect (meaning this test can be used) for the duration of  the COVID-19 declaration under Section 564(b)(1)  of the Act, 21 U.S.C. section 360-bbb-3(b)(1), unless the authorization is terminated or revoked sooner.   Performed at [facility], [address] [place], [place] [address]   Blood Culture (routine x 2)     Status: None (Preliminary result)    Collection Time: 03/25/22  6:01 PM   Specimen: BLOOD RIGHT FOREARM  Result Value Ref Range Status   Specimen Description   Final    BLOOD RIGHT FOREARM Performed at [facility], [address] [place], [place] [address]    Special Requests   Final    BOTTLES DRAWN AEROBIC AND ANAEROBIC Blood Culture results may not be optimal due to an excessive volume of blood received in culture bottles Performed at [facility], [address] [place], [place] [address]    Culture   Final    NO GROWTH 4 DAYS Performed at [facility], [place] [address] [place], [place] [address]    Report Status PENDING  Incomplete  Blood Culture (routine x 2)     Status: None (Preliminary result)   Collection Time: 03/25/22  6:05 PM   Specimen: Left Antecubital; Blood  Result Value Ref Range Status   Specimen Description   Final    LEFT ANTECUBITAL Performed at [facility], [address] [place], [place] [address]    Special Requests   Final    BOTTLES DRAWN AEROBIC AND ANAEROBIC Blood Culture adequate volume Performed at [facility], [address] [place], [place] [address]    Culture   Final    NO GROWTH 4 DAYS Performed at [facility], [place] [address] [place],  [address]    Report Status PENDING  Incomplete      Radiology Studies: CT CHEST W CONTRAST  Result Date: 03/29/2022 CLINICAL DATA:  Shortness of breath, COVID positive EXAM: CT CHEST WITH CONTRAST TECHNIQUE: Multidetector CT imaging of the chest was performed during intravenous contrast administration. RADIATION DOSE REDUCTION: This exam was performed according to the departmental dose-optimization program which includes automated exposure control, adjustment of the mA and/or kV according to patient size and/or use of iterative reconstruction technique. CONTRAST:  40mL OMNIPAQUE IOHEXOL 300 MG/ML  SOLN COMPARISON:  Previous chest radiographs done on 03/25/2022 and CT done on  09/28/2021 FINDINGS: Cardiovascular: Coronary artery calcifications are seen. There is homogeneous enhancement in thoracic aorta. 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 There is  ectasia of main pulmonary artery measuring 3.4 cm. There are no intraluminal filling defects in central pulmonary artery branches. Mediastinum/Nodes: There are slightly enlarged lymph nodes in mediastinum and hilar regions suggesting possible reactive hyperplasia. Lungs/Pleura: Centrilobular emphysema is seen. There is small focus of increased interstitial markings in the lateral aspect of left upper lobe in image 37 of series 5 which has not changed, possibly suggesting scarring. There are few linear densities in both lower lung fields suggesting subsegmental atelectasis/scarring. There is mild ectasia of bronchi in the lower lung fields. There is no focal pulmonary consolidation. There is no pleural effusion or pneumothorax. Upper Abdomen: There is fatty infiltration in liver. There are multiple smooth marginated low-density lesions in the visualized upper portions of both kidneys largest measuring 4.9 cm. Musculoskeletal: Degenerative changes are noted in lower cervical spine with encroachment of neural foramina at C6-C7 level. IMPRESSION: There is no evidence of central pulmonary artery embolism. There is ectasia of main pulmonary artery suggesting possible pulmonary arterial hypertension. There is no evidence of thoracic aortic dissection. Minimal pericardial effusion. Coronary artery calcifications are seen. 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 Recommend annual imaging followup by CTA or MRA. This recommendation follows 2010 ACCF/AHA/AATS/ACR/ASA/SCA/SCAI/SIR/STS/SVM Guidelines for the Diagnosis and Management of Patients with Thoracic Aortic Disease. Circulation. 2010; 121: O709-G283. Aortic aneurysm NOS (ICD10-I71.9) COPD. Linear densities in both lower lung fields have not changed  significantly suggesting scarring or subsegmental atelectasis. There are no new infiltrates or new nodules in the lung fields. Fatty liver.  Bilateral renal cysts.  Cervical spondylosis. Electronically Signed   By: Ernie Avena M.D.   On: 03/29/2022 12:04       LOS: 2 days   Eular Rodgers Rito Ehrlich  [facility] Pager on [email or web address]  03/29/2022, 12:34 PM

## 2022-03-29 NOTE — Progress Notes (Signed)
PHYSICAL THERAPY  Upon arrival, pt sleeping soundly.  I did not disturb. Home Portable oxygen tank was delivered.  Pt plans to return home when medically stable.  Rica Koyanagi  PTA Acute  Rehabilitation Services Office M-F          [phone] Weekend pager [phone]

## 2022-03-29 NOTE — TOC Transition Note (Signed)
Transition of Care [facility]) - CM/SW Discharge Note   Patient Details  Name: Milon Dethloff MRN: [id number] Date of Birth: 09-02-1940  Transition of [facility]) CM/SW Contact:  Dessa Phi, RN Phone Number: 03/29/2022, 1:14 PM   Clinical Narrative: Latricia Heft for HHPT;[employer] for home 02 to deliver travel tank to rm prior d/c.Marland Kitchen Dtr Denman George aware.      Final next level of care: [place] [facility] Barriers to Discharge: Continued Medical Work up   Patient Goals and CMS Choice Patient states their goals for this hospitalization and ongoing recovery are::  (Home) CMS Medicare.gov Compare Post Acute Care list provided to:: Patient Represenative (must comment)    Discharge Placement                       Discharge Plan and Services   Discharge Planning Services: CM Consult                                 Social Determinants of Health (SDOH) Interventions     Readmission Risk Interventions     No data to display

## 2022-03-30 LAB — CULTURE, BLOOD (ROUTINE X 2)
Culture: NO GROWTH
Culture: NO GROWTH
Special Requests: ADEQUATE

## 2022-03-30 NOTE — Discharge Summary (Signed)
[facility]  Physician Discharge Summary   Patient ID: Adrian Rodgers MRN: [id number] DOB/AGE: 80-Sep-1942 80 y.o.  Admit date: 03/25/2022 Discharge date: 03/30/2022    PCP: Elsie Amis, MD  DISCHARGE DIAGNOSES:    Acute respiratory failure due to COVID-19 [facility])   Other emphysema (HCC)   History of CVA (cerebrovascular accident)   Essential hypertension   HLD (hyperlipidemia)   Mood disorder (HCC)   Tobacco abuse   Generalized weakness   RECOMMENDATIONS FOR OUTPATIENT FOLLOW UP: Discussed with patient's daughter regarding following up with PCP at [facility] so that referral can be made to pulmonology.    Home Health: PT Equipment/Devices: Home oxygen  CODE STATUS: Full code  DISCHARGE CONDITION: fair  Diet recommendation: As before  INITIAL HISTORY: 81 year old male with history of mood disorder, CVA with left-sided weakness, hypertension, hyperlipidemia, emphysema who presented from home with 2 days history of progressive weakness, chills, body aches, lightheadedness, cough, intermittent shortness of breath on exertion.  On presentation ,he was hypoxic on room air requiring 2 L.  COVID screen test was positive.  CRP of 1.1.  Chest imaging did not show pneumonia.  Patient was started on steroids, molnupiravir.   HOSPITAL COURSE:   COVID-19 infection/acute respiratory failure with hypoxia Does not use oxygen at baseline.  Found to be positive for COVID-19.  Chest x-ray did not show any pneumonia.  Inflammatory markers were only minimally elevated. Patient was started on molnupiravir which he was not able to tolerate.  He was switched over to Paxlovid.  Denies any nausea with the Paxlovid so far. Continues to require oxygen.  He was not able to be weaned off of oxygen yesterday.  He will need O2 at home.   COPD Patient smokes cigarettes.  No wheezing appreciated on examination.   Due to persistent hypoxia CT chest was done which did not show any PE  but did raise concern for COPD.  This was communicated to patient's daughter. He has been started on [facility].  Will be sent home on steroid taper.  Primary care provider to consider referral to pulmonology.  Tobacco cessation discussed with patient and his daughter.     History of stroke Has residual left-sided weakness.  Continue Plavix.  Holding Lipitor until 3 days after he has finished course of Paxlovid.   Essential hypertension Dose of losartan was increased.  Steroid could be contributing to elevated blood pressure readings.   Hyperlipidemia Lipitor on hold while the patient is on Paxlovid.   Mood disorder Continue bupropion   Tobacco abuse Nicotine patch.  Counseling was provided.   Physical deconditioning Seen by PT.  Home health is recommended.   Patient is stable.  Feels better.  Home oxygen has been arranged.  Okay for discharge.   PERTINENT LABS:  The results of significant diagnostics from this hospitalization (including imaging, microbiology, ancillary and laboratory) are listed below for reference.    Microbiology: Recent Results (from the past 240 hour(s))  SARS Coronavirus 2 by RT PCR (hospital order, performed in [facility] hospital lab) *cepheid single result test* Anterior Nasal Swab     Status: Abnormal   Collection Time: 03/25/22  2:42 PM   Specimen: Anterior Nasal Swab  Result Value Ref Range Status   SARS Coronavirus 2 by RT PCR POSITIVE (A) NEGATIVE Final    Comment: (NOTE) SARS-CoV-2 target nucleic acids are DETECTED  SARS-CoV-2 RNA is generally detectable in upper respiratory specimens  during the acute phase of infection.  Positive results are  indicative  of the presence of the identified virus, but do not rule out bacterial infection or co-infection with other pathogens not detected by the test.  Clinical correlation with patient history and  other diagnostic information is necessary to determine patient infection status.  The expected result  is negative.  Fact Sheet for Patients:   RoadLapTop.co.zahttps://www.fda.gov/media/158405/download   Fact Sheet for Healthcare Providers:   http://kim-miller.com/https://www.fda.gov/media/158404/download    This test is not yet approved or cleared by the [place] FDA and  has been authorized for detection and/or diagnosis of SARS-CoV-2 by FDA under an Emergency Use Authorization (EUA).  This EUA will remain in effect (meaning this test can be used) for the duration of  the COVID-19 declaration under Section 564(b)(1)  of the Act, 21 U.S.C. section 360-bbb-3(b)(1), unless the authorization is terminated or revoked sooner.   Performed at [facility] [place], [address] [place], [place] [address]   Blood Culture (routine x 2)     Status: None   Collection Time: 03/25/22  6:01 PM   Specimen: BLOOD RIGHT FOREARM  Result Value Ref Range Status   Specimen Description   Final    BLOOD RIGHT FOREARM Performed at [facility] [place], [address] [place], [place] [address]    Special Requests   Final    BOTTLES DRAWN AEROBIC AND ANAEROBIC Blood Culture results may not be optimal due to an excessive volume of blood received in culture bottles Performed at [facility] [place], [address] [place], [place] [address]    Culture   Final    NO GROWTH 5 DAYS Performed at [facility], [address] [place], [place] [address]    Report Status 03/30/2022 FINAL  Final  Blood Culture (routine x 2)     Status: None   Collection Time: 03/25/22  6:05 PM   Specimen: Left Antecubital; Blood  Result Value Ref Range Status   Specimen Description   Final    LEFT ANTECUBITAL Performed at [facility] [place], [address] [place], [place] [address]    Special Requests   Final    BOTTLES DRAWN AEROBIC AND ANAEROBIC Blood Culture adequate volume Performed at [facility] [place], [address] [place], [place] [address]    Culture   Final    NO GROWTH 5 DAYS Performed at [facility], [address] [place], [place] [address]    Report Status 03/30/2022 FINAL  Final     Labs:   Basic Metabolic Panel: Recent Labs  Lab 03/25/22 1443 03/26/22 0521 03/27/22 0512 03/28/22 0521  NA 135 141 134* 134*  K 3.7 4.0 3.8 3.9  CL 100 105 98 98  CO2 25 25 25 29   GLUCOSE 93 140* 139* 132*  BUN 14 15 15 16   CREATININE 1.29* 0.89 0.82 0.66  CALCIUM 8.6* 8.8* 8.9 9.0   Liver Function Tests: Recent Labs  Lab 03/25/22 1539 03/26/22 0521 03/27/22 0512 03/28/22 0521  AST 17 15 16  14*  ALT 26 24 26 26   ALKPHOS 77 70 73 69  BILITOT 0.9 0.7 0.7 0.9  PROT 7.1 6.8 7.7 7.5  ALBUMIN 3.8 3.4* 3.8 3.8   CBC: Recent Labs  Lab 03/25/22 1443 03/26/22 0521 03/27/22 0512 03/28/22 0521  WBC 5.0 3.3* 4.8 5.0  NEUTROABS 3.2 2.3 3.6 3.8  HGB 13.8 14.0 14.9 14.8  HCT 42.2 43.3 44.6 44.8  MCV 92.1 95.0 91.6  91.8  PLT 357 351 363 363     IMAGING STUDIES CT CHEST W CONTRAST  Result Date: 03/29/2022 CLINICAL DATA:  Shortness of breath, COVID positive EXAM: CT CHEST WITH CONTRAST TECHNIQUE: Multidetector CT imaging of the chest was performed during intravenous contrast administration. RADIATION DOSE REDUCTION: This exam was performed according to the departmental dose-optimization program which includes automated exposure control, adjustment of the mA and/or kV according to patient size and/or use of iterative reconstruction technique. CONTRAST:  52mL OMNIPAQUE IOHEXOL 300 MG/ML  SOLN COMPARISON:  Previous chest radiographs done on 03/25/2022 and CT done on 09/28/2021 FINDINGS: Cardiovascular: Coronary artery calcifications are seen. There is homogeneous enhancement in thoracic aorta. 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 There is ectasia of main pulmonary artery measuring 3.4 cm. There are no intraluminal filling defects in central pulmonary artery branches. Mediastinum/Nodes: There are slightly enlarged lymph nodes in mediastinum and hilar regions  suggesting possible reactive hyperplasia. Lungs/Pleura: Centrilobular emphysema is seen. There is small focus of increased interstitial markings in the lateral aspect of left upper lobe in image 37 of series 5 which has not changed, possibly suggesting scarring. There are few linear densities in both lower lung fields suggesting subsegmental atelectasis/scarring. There is mild ectasia of bronchi in the lower lung fields. There is no focal pulmonary consolidation. There is no pleural effusion or pneumothorax. Upper Abdomen: There is fatty infiltration in liver. There are multiple smooth marginated low-density lesions in the visualized upper portions of both kidneys largest measuring 4.9 cm. Musculoskeletal: Degenerative changes are noted in lower cervical spine with encroachment of neural foramina at C6-C7 level. IMPRESSION: There is no evidence of central pulmonary artery embolism. There is ectasia of main pulmonary artery suggesting possible pulmonary arterial hypertension. There is no evidence of thoracic aortic dissection. Minimal pericardial effusion. Coronary artery calcifications are seen. 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 Recommend annual imaging followup by CTA or MRA. This recommendation follows 2010 ACCF/AHA/AATS/ACR/ASA/SCA/SCAI/SIR/STS/SVM Guidelines for the Diagnosis and Management of Patients with Thoracic Aortic Disease. Circulation. 2010; 121: Z610-R604. Aortic aneurysm NOS (ICD10-I71.9) COPD. Linear densities in both lower lung fields have not changed significantly suggesting scarring or subsegmental atelectasis. There are no new infiltrates or new nodules in the lung fields. Fatty liver.  Bilateral renal cysts.  Cervical spondylosis. Electronically Signed   By: Elmer Picker M.D.   On: 03/29/2022 12:04   DG Chest 2 View  Result Date: 03/25/2022 CLINICAL DATA:  Shortness of breath, cough. EXAM: CHEST - 2 VIEW COMPARISON:  October 09, 2017. FINDINGS: The heart  size and mediastinal contours are within normal limits. Both lungs are clear. The visualized skeletal structures are unremarkable. IMPRESSION: No active cardiopulmonary disease. Electronically Signed   By: Marijo Conception M.D.   On: 03/25/2022 15:05   CUP PACEART REMOTE DEVICE CHECK  Result Date: 03/19/2022 ILR summary report received. Battery status OK. Normal device function. No new symptom, brady, or pause episodes. No new AF episodes. Monthly summary reports and ROV/PRN 1 new tachy event 8/28 @ 11:10, duration 48min 30sec, HR 214, EGM shows narrow regular rhythm.  Previously viewed and reviewed as alert [place]   DISCHARGE EXAMINATION: Vitals:   03/29/22 1355 03/29/22 2059 03/30/22 0545 03/30/22 0754  BP: (!) 164/100 (!) 153/97 (!) 156/98   Pulse: 86 93 84   Resp: 18 18 18    Temp: 98.9 F (37.2 C) 98.8 F (37.1 C) 98.4 F (36.9 C)   TempSrc: Oral Oral Oral  SpO2: 94% 96% 95% 93%  Weight:      Height:       General appearance: Awake alert.  In no distress Resp: Clear to auscultation bilaterally.  Normal effort Cardio: S1-S2 is normal regular.  No S3-S4.  No rubs murmurs or bruit GI: Abdomen is soft.  Nontender nondistended.  Bowel sounds are present normal.  No masses organomegaly    DISPOSITION: Home with daughter  Discharge Instructions     Call MD for:  difficulty breathing, headache or visual disturbances   Complete by: As directed    Call MD for:  extreme fatigue   Complete by: As directed    Call MD for:  persistant dizziness or light-headedness   Complete by: As directed    Call MD for:  persistant nausea and vomiting   Complete by: As directed    Call MD for:  severe uncontrolled pain   Complete by: As directed    Call MD for:  temperature >100.4   Complete by: As directed    Diet - low sodium heart healthy   Complete by: As directed    Discharge instructions   Complete by: As directed    Please take your medications as prescribed.  Please be sure to follow-up  with your primary care provider within 1 week.  You will need a referral from them to see a lung doctor for your COPD.  Wear the oxygen all the time.  You were cared for by a hospitalist during your hospital stay. If you have any questions about your discharge medications or the care you received while you were in the hospital after you are discharged, you can call the unit and asked to speak with the hospitalist on call if the hospitalist that took care of you is not available. Once you are discharged, your primary care physician will handle any further medical issues. Please note that NO REFILLS for any discharge medications will be authorized once you are discharged, as it is imperative that you return to your primary care physician (or establish a relationship with a primary care physician if you do not have one) for your aftercare needs so that they can reassess your need for medications and monitor your lab values. If you do not have a primary care physician, you can call [phone] for a physician referral.   Increase activity slowly   Complete by: As directed           Allergies as of 03/30/2022   No Known Allergies      Medication List     TAKE these medications    acetaminophen 500 MG tablet Commonly known as: TYLENOL Take 1,000 mg by mouth every 6 (six) hours as needed for moderate pain, mild pain, fever or headache.   albuterol 108 (90 Base) MCG/ACT inhaler Commonly known as: VENTOLIN HFA Inhale 2 puffs into the lungs every 6 (six) hours as needed for wheezing or shortness of breath.   atorvastatin 80 MG tablet Commonly known as: LIPITOR Take 1 tablet (80 mg total) by mouth daily. Start taking on: April 03, 2022 What changed: These instructions start on April 03, 2022. If you are unsure what to do until then, ask your doctor or other care provider.   buPROPion 150 MG 24 hr tablet Commonly known as: WELLBUTRIN XL Take 150 mg by mouth daily.   Centrum Silver 50+Men  Tabs Take 1 tablet by mouth daily with breakfast.   clopidogrel 75 MG tablet Commonly known as:  PLAVIX Take 1 tablet by mouth daily.   ibuprofen 200 MG tablet Commonly known as: ADVIL Take 200 mg by mouth every 6 (six) hours as needed for mild pain, moderate pain or headache.   losartan 50 MG tablet Commonly known as: COZAAR Take 2 tablets (100 mg total) by mouth daily. What changed: how much to take   mometasone-formoterol 100-5 MCG/ACT Aero Commonly known as: DULERA Inhale 2 puffs into the lungs 2 (two) times daily.   nicotine 14 mg/24hr patch Commonly known as: NICODERM CQ - dosed in mg/24 hours Place 14 mg onto the skin daily.   nirmatrelvir/ritonavir EUA 20 x 150 MG & 10 x 100MG  Tabs Commonly known as: PAXLOVID Take 3 tablets by mouth 2 (two) times daily for 3 days. Patient GFR is >60. Take nirmatrelvir (150 mg) two tablets twice daily for 5 days and ritonavir (100 mg) one tablet twice daily for 5 days.   predniSONE 20 MG tablet Commonly known as: DELTASONE Take 3 tablets once daily for 3 days followed by 2 tablets once daily for 3 days followed by 1 tablet once daily for 3 days and then stop          Follow-up Information     [facility] Follow up.   Why: [facility] Contact information: [address] [place] [phone]         [facility] Follow up.   Why: Home oxygen Contact information: [address] [place] [phone] [place] [phone]                 TOTAL DISCHARGE TIME: 35 minutes  [place] [phone] on [email or web address]  03/31/2022, 12:47 PM

## 2022-04-02 NOTE — Progress Notes (Signed)
[facility] Summary Report / Loop Recorder 

## 2022-04-08 ENCOUNTER — Emergency Department (HOSPITAL_COMMUNITY): Payer: Medicare Other

## 2022-04-08 ENCOUNTER — Encounter (HOSPITAL_COMMUNITY): Payer: Self-pay

## 2022-04-08 ENCOUNTER — Other Ambulatory Visit: Payer: Self-pay

## 2022-04-08 ENCOUNTER — Inpatient Hospital Stay (HOSPITAL_COMMUNITY)
Admission: EM | Admit: 2022-04-08 | Discharge: 2022-04-13 | DRG: 314 | Disposition: A | Discharge status: Skilled Nursing Facility | Payer: Medicare Other | Attending: Internal Medicine | Admitting: Internal Medicine

## 2022-04-08 DIAGNOSIS — Z6826 Body mass index (BMI) 26.0-26.9, adult: Secondary | ICD-10-CM

## 2022-04-08 DIAGNOSIS — I9589 Other hypotension: Secondary | ICD-10-CM | POA: Diagnosis not present

## 2022-04-08 DIAGNOSIS — G8929 Other chronic pain: Secondary | ICD-10-CM | POA: Diagnosis present

## 2022-04-08 DIAGNOSIS — J449 Chronic obstructive pulmonary disease, unspecified: Secondary | ICD-10-CM | POA: Diagnosis present

## 2022-04-08 DIAGNOSIS — D75839 Thrombocytosis, unspecified: Secondary | ICD-10-CM | POA: Diagnosis present

## 2022-04-08 DIAGNOSIS — D62 Acute posthemorrhagic anemia: Secondary | ICD-10-CM | POA: Diagnosis present

## 2022-04-08 DIAGNOSIS — Y738 Miscellaneous gastroenterology and urology devices associated with adverse incidents, not elsewhere classified: Secondary | ICD-10-CM | POA: Diagnosis not present

## 2022-04-08 DIAGNOSIS — U071 COVID-19: Secondary | ICD-10-CM | POA: Diagnosis not present

## 2022-04-08 DIAGNOSIS — K649 Unspecified hemorrhoids: Secondary | ICD-10-CM | POA: Diagnosis present

## 2022-04-08 DIAGNOSIS — D649 Anemia, unspecified: Secondary | ICD-10-CM

## 2022-04-08 DIAGNOSIS — K760 Fatty (change of) liver, not elsewhere classified: Secondary | ICD-10-CM | POA: Diagnosis present

## 2022-04-08 DIAGNOSIS — K922 Gastrointestinal hemorrhage, unspecified: Secondary | ICD-10-CM | POA: Diagnosis present

## 2022-04-08 DIAGNOSIS — Z20822 Contact with and (suspected) exposure to covid-19: Secondary | ICD-10-CM | POA: Diagnosis present

## 2022-04-08 DIAGNOSIS — I10 Essential (primary) hypertension: Secondary | ICD-10-CM | POA: Diagnosis present

## 2022-04-08 DIAGNOSIS — E860 Dehydration: Secondary | ICD-10-CM | POA: Diagnosis present

## 2022-04-08 DIAGNOSIS — G9340 Encephalopathy, unspecified: Secondary | ICD-10-CM | POA: Diagnosis present

## 2022-04-08 DIAGNOSIS — R310 Gross hematuria: Secondary | ICD-10-CM | POA: Diagnosis not present

## 2022-04-08 DIAGNOSIS — J9600 Acute respiratory failure, unspecified whether with hypoxia or hypercapnia: Secondary | ICD-10-CM

## 2022-04-08 DIAGNOSIS — R630 Anorexia: Secondary | ICD-10-CM | POA: Diagnosis present

## 2022-04-08 DIAGNOSIS — Z8619 Personal history of other infectious and parasitic diseases: Secondary | ICD-10-CM

## 2022-04-08 DIAGNOSIS — N200 Calculus of kidney: Secondary | ICD-10-CM | POA: Diagnosis present

## 2022-04-08 DIAGNOSIS — I77810 Thoracic aortic ectasia: Secondary | ICD-10-CM | POA: Diagnosis present

## 2022-04-08 DIAGNOSIS — I69354 Hemiplegia and hemiparesis following cerebral infarction affecting left non-dominant side: Secondary | ICD-10-CM

## 2022-04-08 DIAGNOSIS — G9349 Other encephalopathy: Secondary | ICD-10-CM | POA: Diagnosis present

## 2022-04-08 DIAGNOSIS — E861 Hypovolemia: Principal | ICD-10-CM

## 2022-04-08 DIAGNOSIS — J441 Chronic obstructive pulmonary disease with (acute) exacerbation: Secondary | ICD-10-CM | POA: Diagnosis present

## 2022-04-08 DIAGNOSIS — R195 Other fecal abnormalities: Secondary | ICD-10-CM

## 2022-04-08 DIAGNOSIS — Z79899 Other long term (current) drug therapy: Secondary | ICD-10-CM

## 2022-04-08 DIAGNOSIS — K5900 Constipation, unspecified: Secondary | ICD-10-CM | POA: Diagnosis present

## 2022-04-08 DIAGNOSIS — F32A Depression, unspecified: Secondary | ICD-10-CM | POA: Diagnosis present

## 2022-04-08 DIAGNOSIS — J438 Other emphysema: Secondary | ICD-10-CM | POA: Diagnosis present

## 2022-04-08 DIAGNOSIS — Z8701 Personal history of pneumonia (recurrent): Secondary | ICD-10-CM

## 2022-04-08 DIAGNOSIS — N99820 Postprocedural hemorrhage and hematoma of a genitourinary system organ or structure following a genitourinary system procedure: Secondary | ICD-10-CM | POA: Diagnosis not present

## 2022-04-08 DIAGNOSIS — E2740 Unspecified adrenocortical insufficiency: Secondary | ICD-10-CM | POA: Diagnosis present

## 2022-04-08 DIAGNOSIS — R531 Weakness: Secondary | ICD-10-CM | POA: Diagnosis not present

## 2022-04-08 DIAGNOSIS — Z7951 Long term (current) use of inhaled steroids: Secondary | ICD-10-CM

## 2022-04-08 DIAGNOSIS — R339 Retention of urine, unspecified: Secondary | ICD-10-CM | POA: Diagnosis present

## 2022-04-08 DIAGNOSIS — D72829 Elevated white blood cell count, unspecified: Secondary | ICD-10-CM | POA: Diagnosis present

## 2022-04-08 DIAGNOSIS — R6510 Systemic inflammatory response syndrome (SIRS) of non-infectious origin without acute organ dysfunction: Secondary | ICD-10-CM | POA: Diagnosis present

## 2022-04-08 DIAGNOSIS — Z8616 Personal history of COVID-19: Secondary | ICD-10-CM

## 2022-04-08 DIAGNOSIS — R000 Tachycardia, unspecified: Secondary | ICD-10-CM | POA: Diagnosis not present

## 2022-04-08 DIAGNOSIS — K250 Acute gastric ulcer with hemorrhage: Secondary | ICD-10-CM | POA: Diagnosis present

## 2022-04-08 DIAGNOSIS — Z7902 Long term (current) use of antithrombotics/antiplatelets: Secondary | ICD-10-CM

## 2022-04-08 DIAGNOSIS — E7800 Pure hypercholesterolemia, unspecified: Secondary | ICD-10-CM | POA: Diagnosis present

## 2022-04-08 DIAGNOSIS — Y846 Urinary catheterization as the cause of abnormal reaction of the patient, or of later complication, without mention of misadventure at the time of the procedure: Secondary | ICD-10-CM | POA: Diagnosis not present

## 2022-04-08 DIAGNOSIS — Z8673 Personal history of transient ischemic attack (TIA), and cerebral infarction without residual deficits: Secondary | ICD-10-CM

## 2022-04-08 DIAGNOSIS — F17210 Nicotine dependence, cigarettes, uncomplicated: Secondary | ICD-10-CM | POA: Diagnosis present

## 2022-04-08 DIAGNOSIS — R627 Adult failure to thrive: Secondary | ICD-10-CM | POA: Diagnosis present

## 2022-04-08 DIAGNOSIS — K3189 Other diseases of stomach and duodenum: Secondary | ICD-10-CM | POA: Diagnosis present

## 2022-04-08 DIAGNOSIS — K2100 Gastro-esophageal reflux disease with esophagitis, without bleeding: Secondary | ICD-10-CM | POA: Diagnosis present

## 2022-04-08 LAB — URINALYSIS, ROUTINE W REFLEX MICROSCOPIC
Bilirubin Urine: NEGATIVE
Glucose, UA: NEGATIVE mg/dL
Hgb urine dipstick: NEGATIVE
Ketones, ur: NEGATIVE mg/dL
Nitrite: NEGATIVE
Protein, ur: NEGATIVE mg/dL
Specific Gravity, Urine: 1.013 (ref 1.005–1.030)
pH: 6 (ref 5.0–8.0)

## 2022-04-08 LAB — CBC WITH DIFFERENTIAL/PLATELET
Abs Immature Granulocytes: 0.11 10*3/uL — ABNORMAL HIGH (ref 0.00–0.07)
Basophils Absolute: 0 10*3/uL (ref 0.0–0.1)
Basophils Relative: 0 %
Eosinophils Absolute: 0 10*3/uL (ref 0.0–0.5)
Eosinophils Relative: 0 %
HCT: 39.7 % (ref 39.0–52.0)
Hemoglobin: 13.2 g/dL (ref 13.0–17.0)
Immature Granulocytes: 1 %
Lymphocytes Relative: 5 %
Lymphs Abs: 0.8 10*3/uL (ref 0.7–4.0)
MCH: 30.8 pg (ref 26.0–34.0)
MCHC: 33.2 g/dL (ref 30.0–36.0)
MCV: 92.5 fL (ref 80.0–100.0)
Monocytes Absolute: 0.4 10*3/uL (ref 0.1–1.0)
Monocytes Relative: 2 %
Neutro Abs: 15.3 10*3/uL — ABNORMAL HIGH (ref 1.7–7.7)
Neutrophils Relative %: 92 %
Platelets: 507 10*3/uL — ABNORMAL HIGH (ref 150–400)
RBC: 4.29 MIL/uL (ref 4.22–5.81)
RDW: 14 % (ref 11.5–15.5)
WBC: 16.6 10*3/uL — ABNORMAL HIGH (ref 4.0–10.5)
nRBC: 0 % (ref 0.0–0.2)

## 2022-04-08 LAB — COMPREHENSIVE METABOLIC PANEL
ALT: 32 U/L (ref 0–44)
AST: 10 U/L — ABNORMAL LOW (ref 15–41)
Albumin: 2.9 g/dL — ABNORMAL LOW (ref 3.5–5.0)
Alkaline Phosphatase: 51 U/L (ref 38–126)
Anion gap: 9 (ref 5–15)
BUN: 25 mg/dL — ABNORMAL HIGH (ref 8–23)
CO2: 25 mmol/L (ref 22–32)
Calcium: 9.1 mg/dL (ref 8.9–10.3)
Chloride: 99 mmol/L (ref 98–111)
Creatinine, Ser: 1.23 mg/dL (ref 0.61–1.24)
GFR, Estimated: 59 mL/min — ABNORMAL LOW (ref >60)
Glucose, Bld: 194 mg/dL — ABNORMAL HIGH (ref 70–99)
Potassium: 4.7 mmol/L (ref 3.5–5.1)
Sodium: 133 mmol/L — ABNORMAL LOW (ref 135–145)
Total Bilirubin: 0.7 mg/dL (ref 0.3–1.2)
Total Protein: 5.9 g/dL — ABNORMAL LOW (ref 6.5–8.1)

## 2022-04-08 LAB — MRSA NEXT GEN BY PCR, NASAL: MRSA by PCR Next Gen: NOT DETECTED

## 2022-04-08 LAB — RESP PANEL BY RT-PCR (FLU A&B, COVID) ARPGX2
Influenza A by PCR: NEGATIVE
Influenza B by PCR: NEGATIVE
SARS Coronavirus 2 by RT PCR: NEGATIVE

## 2022-04-08 LAB — PROTIME-INR
INR: 1.1 (ref 0.8–1.2)
Prothrombin Time: 14.2 seconds (ref 11.4–15.2)

## 2022-04-08 LAB — APTT: aPTT: 26 seconds (ref 24–36)

## 2022-04-08 LAB — LACTIC ACID, PLASMA: Lactic Acid, Venous: 1.6 mmol/L (ref 0.5–1.9)

## 2022-04-08 MED ORDER — SODIUM CHLORIDE 0.9 % IV SOLN
2.0000 g | Freq: Two times a day (BID) | INTRAVENOUS | Status: DC
  Administered 2022-04-09 – 2022-04-11 (×5): 2 g via INTRAVENOUS
  Filled 2022-04-08 (×5): qty 12.5

## 2022-04-08 MED ORDER — POLYETHYLENE GLYCOL 3350 17 G PO PACK
17.0000 g | PACK | Freq: Every day | ORAL | Status: DC | PRN
  Administered 2022-04-09: 17 g via ORAL
  Filled 2022-04-08: qty 1

## 2022-04-08 MED ORDER — CLOPIDOGREL BISULFATE 75 MG PO TABS
75.0000 mg | ORAL_TABLET | Freq: Every day | ORAL | Status: DC
  Administered 2022-04-09: 75 mg via ORAL
  Filled 2022-04-08 (×2): qty 1

## 2022-04-08 MED ORDER — ONDANSETRON HCL 4 MG/2ML IJ SOLN: 4.0000 mg | Freq: Four times a day (QID) | INTRAMUSCULAR | Status: DC | PRN

## 2022-04-08 MED ORDER — LACTATED RINGERS IV BOLUS (SEPSIS)
1000.0000 mL | Freq: Once | INTRAVENOUS | Status: AC
  Administered 2022-04-08: 1000 mL via INTRAVENOUS

## 2022-04-08 MED ORDER — LACTATED RINGERS IV BOLUS (SEPSIS)
500.0000 mL | Freq: Once | INTRAVENOUS | Status: AC
  Administered 2022-04-08: 500 mL via INTRAVENOUS

## 2022-04-08 MED ORDER — SODIUM CHLORIDE 0.9 % IV SOLN
2.0000 g | Freq: Once | INTRAVENOUS | Status: AC
  Administered 2022-04-08: 2 g via INTRAVENOUS
  Filled 2022-04-08: qty 12.5

## 2022-04-08 MED ORDER — LACTATED RINGERS IV SOLN: INTRAVENOUS | Status: DC

## 2022-04-08 MED ORDER — ACETAMINOPHEN 325 MG PO TABS
650.0000 mg | ORAL_TABLET | Freq: Four times a day (QID) | ORAL | Status: DC | PRN
  Administered 2022-04-11 – 2022-04-12 (×2): 650 mg via ORAL
  Filled 2022-04-08 (×2): qty 2

## 2022-04-08 MED ORDER — LIDOCAINE HCL URETHRAL/MUCOSAL 2 % EX GEL
1.0000 | Freq: Once | CUTANEOUS | Status: AC
  Administered 2022-04-08: 1 via TOPICAL
  Filled 2022-04-08: qty 11

## 2022-04-08 MED ORDER — ONDANSETRON HCL 4 MG PO TABS: 4.0000 mg | ORAL_TABLET | Freq: Four times a day (QID) | ORAL | Status: DC | PRN

## 2022-04-08 MED ORDER — VANCOMYCIN HCL 1250 MG/250ML IV SOLN
1250.0000 mg | INTRAVENOUS | Status: DC
  Administered 2022-04-09: 1250 mg via INTRAVENOUS
  Filled 2022-04-08 (×2): qty 250

## 2022-04-08 MED ORDER — SODIUM CHLORIDE 0.9% FLUSH
3.0000 mL | Freq: Two times a day (BID) | INTRAVENOUS | Status: DC
  Administered 2022-04-09 – 2022-04-13 (×9): 3 mL via INTRAVENOUS

## 2022-04-08 MED ORDER — VANCOMYCIN HCL 1750 MG/350ML IV SOLN
1750.0000 mg | Freq: Once | INTRAVENOUS | Status: AC
  Administered 2022-04-08: 1750 mg via INTRAVENOUS
  Filled 2022-04-08 (×2): qty 350

## 2022-04-08 MED ORDER — VANCOMYCIN HCL IN DEXTROSE 1-5 GM/200ML-% IV SOLN: 1000.0000 mg | Freq: Once | INTRAVENOUS | Status: DC

## 2022-04-08 MED ORDER — MOMETASONE FURO-FORMOTEROL FUM 100-5 MCG/ACT IN AERO
2.0000 | INHALATION_SPRAY | Freq: Two times a day (BID) | RESPIRATORY_TRACT | Status: DC
  Administered 2022-04-10 – 2022-04-13 (×7): 2 via RESPIRATORY_TRACT
  Filled 2022-04-08 (×2): qty 8.8

## 2022-04-08 MED ORDER — ALBUTEROL SULFATE (2.5 MG/3ML) 0.083% IN NEBU: 3.0000 mL | INHALATION_SOLUTION | Freq: Four times a day (QID) | RESPIRATORY_TRACT | Status: DC | PRN

## 2022-04-08 MED ORDER — ATORVASTATIN CALCIUM 80 MG PO TABS
80.0000 mg | ORAL_TABLET | Freq: Every day | ORAL | Status: DC
  Administered 2022-04-09 – 2022-04-13 (×4): 80 mg via ORAL
  Filled 2022-04-08: qty 2
  Filled 2022-04-08 (×4): qty 1

## 2022-04-08 MED ORDER — ACETAMINOPHEN 650 MG RE SUPP: 650.0000 mg | Freq: Four times a day (QID) | RECTAL | Status: DC | PRN

## 2022-04-08 MED ORDER — METRONIDAZOLE 500 MG/100ML IV SOLN
500.0000 mg | Freq: Two times a day (BID) | INTRAVENOUS | Status: DC
  Administered 2022-04-09 – 2022-04-11 (×6): 500 mg via INTRAVENOUS
  Filled 2022-04-08 (×6): qty 100

## 2022-04-08 MED ORDER — ENOXAPARIN SODIUM 40 MG/0.4ML IJ SOSY
40.0000 mg | PREFILLED_SYRINGE | INTRAMUSCULAR | Status: DC
  Administered 2022-04-09 – 2022-04-13 (×5): 40 mg via SUBCUTANEOUS
  Filled 2022-04-08 (×5): qty 0.4

## 2022-04-08 NOTE — Sepsis Progress Note (Signed)
Sepsis protocol is being followed by [facility]. 

## 2022-04-08 NOTE — ED Provider Notes (Signed)
[facility] EMERGENCY DEPARTMENT Provider Note   CSN: [id number] Arrival date & time: 04/08/22  1625     History {Add pertinent medical, surgical, social history, OB history to HPI:1} Chief Complaint  Patient presents with   Altered Mental Status   Hypotension   Weakness    Adrian Rodgers is a 81 y.o. male.   Altered Mental Status Associated symptoms: weakness   Weakness      Home Medications Prior to Admission medications   Medication Sig Start Date End Date Taking? Authorizing Provider  acetaminophen (TYLENOL) 500 MG tablet Take 1,000 mg by mouth every 6 (six) hours as needed for moderate pain, mild pain, fever or headache.    [provider]  albuterol (PROVENTIL HFA;VENTOLIN HFA) 108 (90 Base) MCG/ACT inhaler Inhale 2 puffs into the lungs every 6 (six) hours as needed for wheezing or shortness of breath. 09/21/17   Purohit, Salli Quarry, MD  atorvastatin (LIPITOR) 80 MG tablet Take 1 tablet (80 mg total) by mouth daily. 04/03/22   Osvaldo Shipper, MD  buPROPion (WELLBUTRIN XL) 150 MG 24 hr tablet Take 150 mg by mouth daily. 01/15/21   [provider]  clopidogrel (PLAVIX) 75 MG tablet Take 1 tablet by mouth daily. 10/23/21   [provider]  ibuprofen (ADVIL) 200 MG tablet Take 200 mg by mouth every 6 (six) hours as needed for mild pain, moderate pain or headache.    [provider]  losartan (COZAAR) 50 MG tablet Take 2 tablets (100 mg total) by mouth daily. 03/29/22   Osvaldo Shipper, MD  mometasone-formoterol [facility]) 100-5 MCG/ACT AERO Inhale 2 puffs into the lungs 2 (two) times daily. 03/29/22   Osvaldo Shipper, MD  Multiple Vitamins-Minerals (CENTRUM SILVER 50+MEN) TABS Take 1 tablet by mouth daily with breakfast.    [provider]  nicotine (NICODERM CQ - DOSED IN MG/24 HOURS) 14 mg/24hr patch Place 14 mg onto the skin daily.    [provider]  predniSONE (DELTASONE) 20 MG tablet Take 3 tablets once  daily for 3 days followed by 2 tablets once daily for 3 days followed by 1 tablet once daily for 3 days and then stop 03/29/22   Osvaldo Shipper, MD      Allergies    Patient has no known allergies.    Review of Systems   Review of Systems  Neurological:  Positive for weakness.    Physical Exam Updated Vital Signs BP (!) 150/103   Pulse 76   Temp (!) 96.6 F (35.9 C) (Axillary)   Resp (!) 21   Ht 5\' 8"  (1.727 m)   Wt 84.8 kg   SpO2 95%   BMI 28.43 kg/m  Physical Exam  ED Results / Procedures / Treatments   Labs (all labs ordered are listed, but only abnormal results are displayed) Labs Reviewed  COMPREHENSIVE METABOLIC PANEL - Abnormal; Notable for the following components:      Result Value   Sodium 133 (*)    Glucose, Bld 194 (*)    BUN 25 (*)    Total Protein 5.9 (*)    Albumin 2.9 (*)    AST 10 (*)    GFR, Estimated 59 (*)    All other components within normal limits  CBC WITH DIFFERENTIAL/PLATELET - Abnormal; Notable for the following components:   WBC 16.6 (*)    Platelets 507 (*)    Neutro Abs 15.3 (*)    Abs Immature Granulocytes 0.11 (*)    All  other components within normal limits  URINALYSIS, ROUTINE W REFLEX MICROSCOPIC - Abnormal; Notable for the following components:   Leukocytes,Ua TRACE (*)    Bacteria, UA RARE (*)    All other components within normal limits  RESP PANEL BY RT-PCR (FLU A&B, COVID) ARPGX2  MRSA NEXT GEN BY PCR, NASAL  CULTURE, BLOOD (ROUTINE X 2)  CULTURE, BLOOD (ROUTINE X 2)  URINE CULTURE  LACTIC ACID, PLASMA  PROTIME-INR  APTT  LACTIC ACID, PLASMA    EKG EKG Interpretation  Date/Time:  Monday April 08 2022 16:38:52 EDT Ventricular Rate:  95 PR Interval:  179 QRS Duration: 96 QT Interval:  360 QTC Calculation: 453 R Axis:   269 Text Interpretation: Ectopic atrial rhythm Right superior axis Lateral infarct, acute (LAD) No significant change since last tracing Confirmed by Richardean Canal [phone]) on 04/08/2022 4:45:39  PM  Radiology CT Head Wo Contrast  Result Date: 04/08/2022 CLINICAL DATA:  Altered mental status, recent COVID EXAM: CT HEAD WITHOUT CONTRAST TECHNIQUE: Contiguous axial images were obtained from the base of the skull through the vertex without intravenous contrast. RADIATION DOSE REDUCTION: This exam was performed according to the departmental dose-optimization program which includes automated exposure control, adjustment of the mA and/or kV according to patient size and/or use of iterative reconstruction technique. COMPARISON:  03/27/2021 FINDINGS: Brain: No evidence of acute infarction, hemorrhage, hydrocephalus, extra-axial collection or mass lesion/mass effect. Global cortical atrophy. Subcortical white matter and periventricular small vessel ischemic changes. Vascular: Intracranial atherosclerosis. Skull: Normal. Negative for fracture or focal lesion. Sinuses/Orbits: The visualized paranasal sinuses are essentially clear. The mastoid air cells are unopacified. Other: None. IMPRESSION: No acute intracranial abnormality. Atrophy with small vessel ischemic changes. Electronically Signed   By: Charline Bills M.D.   On: 04/08/2022 21:04   DG Chest Port 1 View  Result Date: 04/08/2022 CLINICAL DATA:  Questionable sepsis - evaluate for abnormality EXAM: PORTABLE CHEST 1 VIEW COMPARISON:  March 25, 2022 FINDINGS: The cardiomediastinal silhouette is unchanged in contour.Atherosclerotic calcifications. Cardiac loop recorder. No pleural effusion. No pneumothorax. No acute pleuroparenchymal abnormality. Visualized abdomen is unremarkable. IMPRESSION: No acute cardiopulmonary abnormality. Electronically Signed   By: Meda Klinefelter M.D.   On: 04/08/2022 17:32    Procedures Procedures  {Document cardiac monitor, telemetry assessment procedure when appropriate:1}  Medications Ordered in ED Medications  lactated ringers infusion ( Intravenous New Bag/Given 04/08/22 1946)  vancomycin (VANCOREADY)  IVPB 1750 mg/350 mL (1,750 mg Intravenous New Bag/Given 04/08/22 2118)  ceFEPIme (MAXIPIME) 2 g in sodium chloride 0.9 % 100 mL IVPB (has no administration in time range)  vancomycin (VANCOREADY) IVPB 1250 mg/250 mL (has no administration in time range)  lactated ringers bolus 1,000 mL (0 mLs Intravenous Stopped 04/08/22 1946)    And  lactated ringers bolus 1,000 mL (0 mLs Intravenous Stopped 04/08/22 2125)    And  lactated ringers bolus 500 mL (0 mLs Intravenous Stopped 04/08/22 2218)  ceFEPIme (MAXIPIME) 2 g in sodium chloride 0.9 % 100 mL IVPB (0 g Intravenous Stopped 04/08/22 1741)  lidocaine (XYLOCAINE) 2 % jelly 1 Application (1 Application Topical Given 04/08/22 2147)    ED Course/ Medical Decision Making/ A&P Clinical Course as of 04/08/22 2259  Mon Apr 08, 2022  1802 Patient is presenting from his doctor's office today due to concern for altered mental status.  Patient was recently admitted and discharged for acute hypoxic respiratory failure secondary to COVID-pneumonia.  Since his discharge, patient's been generally unwell.  He has had poor p.o.  intake and has felt very weak.  He has not had any significant fevers.  Patient went to his PCP today for reevaluation after his discharge is found be hypotensive with systolics in the 87N, altered, having worsening left-sided deficits (worse than his chronic deficits).  EMS evaluated the patient at the primary care office and give the patient 1 L fluid bolus for resuscitation.  Patient's mental status significantly improved following fluid bolus, his blood pressures improved and he became normotensive.  Patient's reported left-sided weakness also improved. [JN]  2023 On arrival to the emergency department, patient was GCS of 14, ANO x2, not oriented to time.  Patient was hemodynamically stable, normotensive, ABCs intact.  Patient no complaints at this time outside of feeling generally fatigued. [JN]    Clinical Course User Index [JN] Obi Scrima, [place],  MD                           Medical Decision Making Amount and/or Complexity of Data Reviewed Labs: ordered. Radiology: ordered. ECG/medicine tests: ordered.  Risk Prescription drug management. Decision regarding hospitalization.   ***  {Document critical care time when appropriate:1} {Document review of labs and clinical decision tools ie heart score, Chads2Vasc2 etc:1}  {Document your independent review of radiology images, and any outside records:1} {Document your discussion with family members, caretakers, and with consultants:1} {Document social determinants of health affecting pt's care:1} {Document your decision making why or why not admission, treatments were needed:1} Final Clinical Impression(s) / ED Diagnoses Final diagnoses:  None    Rx / DC Orders ED Discharge Orders     None

## 2022-04-08 NOTE — ED Triage Notes (Signed)
Pt bib ems from [facility]; pt diagnosed with Covid at end of September; since then c/o decreased PO intake, generalized weakness, sob; unable to ambulate today due to weakness; while being evaluated by PCP, witnessed acute L sided weakness, L leg weakness, mild AMS; hx stroke x 1 year ago, weak on L side at baseline; pt endorses weakness worse than normal at MD office, but has since improved; now feels back to baseline; pt also found to be hypotensive, BP 78/40 initially; pt given 800 mls NS PTA; last bp 108 palp; denies dizziness, cp, sob at present; more responsive after bolus, answering questions; o2 sats initially upper 80s-low 90s RA; pt on 2L, sats now 95%; cbg 186, HR 96, 118/76, peaked T waves noted on ems 12 lead.

## 2022-04-08 NOTE — Progress Notes (Signed)
Pharmacy Antibiotic Note  Taner Rzepka is a 81 y.o. male admitted on 04/08/2022 with sepsis.  Pharmacy has been consulted for vancomycin and cefepime dosing.  Patient presented increased weakness and shortness of breath along with decreased PO intake. Presumed to have sepsis. Received vancomycin 1750 mg once for loading dose.  Plan: Initiate vancomycin 1250 mg every 24 hours (eAUC 477).  Add MRSA PCR. Initiate cefepime 2 g every 12 hours.    Monitor renal function, CBC, s/sx of infection, and vancomycin levels at steady state. Follow-up MRSA PCR.   Height: 5\' 8"  (172.7 cm) Weight: 84.8 kg (187 lb) IBW/kg (Calculated) : 68.4  Temp (24hrs), Avg:98.2 F (36.8 C), Min:98.2 F (36.8 C), Max:98.2 F (36.8 C)  No results for input(s): "WBC", "CREATININE", "LATICACIDVEN", "VANCOTROUGH", "VANCOPEAK", "VANCORANDOM", "GENTTROUGH", "GENTPEAK", "GENTRANDOM", "TOBRATROUGH", "TOBRAPEAK", "TOBRARND", "AMIKACINPEAK", "AMIKACINTROU", "AMIKACIN" in the last 168 hours.  Estimated Creatinine Clearance: 78.1 mL/min (by C-G formula based on SCr of 0.66 mg/dL).    No Known Allergies  Microbiology results: 10/9 BCx: collected  Thank you for allowing pharmacy to be a part of this patient's care.  Sinda Du, PharmD Candidate 04/08/2022 4:50 PM

## 2022-04-08 NOTE — H&P (Signed)
History and Physical    Adrian Rodgers [id number] DOB: 07-25-1940 DOA: 04/08/2022  PCP: Pcp, No   Patient coming from: Home   Chief Complaint: Weakness, confusion, hypotension   HPI: Adrian Rodgers is a pleasant 81 y.o. male with medical history significant for history of CVA, hypertension, COPD, and admission last month for COVID with acute hypoxic respiratory failure, now presenting to the emergency department with loss of appetite, generalized weakness, mild confusion, and hypotension.  Patient was discharged from the hospital 10 days ago and has had poor appetite at home, stating that he has lost his sense of taste, and has grown progressively weak in general.  He was seen his new PCP today in clinic and was noted to have blood pressure of 82/52.  He was mildly confused.  EMS was called, blood pressure was 78/40, and he was given 800 mL of NS prior to arrival in the ED.  He denies abdominal pain, chest pain, shortness of breath, nausea, vomiting, or diarrhea.  He just completed a course of prednisone.  He has been using 2 L/min of supplemental oxygen at home since the recent discharge.  ED Course: Upon arrival to the ED, patient is found to be afebrile and saturating in the low to mid 90s on room air with transient tachypnea, normal heart rate, and stable blood pressure.  EKG features ectopic atrial rhythm.  Head CT negative for acute cranial abnormality.  Chest x-ray negative for acute cardiopulmonary disease.  Chemistry panel notable for BUN of 25.  CBC features a leukocytosis to 16,600 and mild thrombocytosis.  Lactic acid is normal.    Blood and urine cultures were collected and the patient was given 2.5 L of LR, vancomycin, cefepime, and had Foley catheter placed in the ED.  Review of Systems:  All other systems reviewed and apart from HPI, are negative.  Past Medical History:  Diagnosis Date   Community acquired pneumonia of right middle lobe of lung 09/20/2017   High  cholesterol    Hypertension    Sepsis ([place]) 09/19/2017   Stroke ([place]) 05/01/2020   Tobacco dependence     Past Surgical History:  Procedure Laterality Date   implantable loop recorder placement  06/05/2020   [employer] Reveal Aspen model [id number]) implantable loop recorder     Social History:   reports that he has been smoking cigarettes. He has a 20.00 pack-year smoking history. He has never used smokeless tobacco. He reports current alcohol use of about 1.0 standard drink of alcohol per week. He reports that he does not use drugs.  No Known Allergies  Family History  Family history unknown: Yes     Prior to Admission medications   Medication Sig Start Date End Date Taking? Authorizing Provider  acetaminophen (TYLENOL) 500 MG tablet Take 1,000 mg by mouth every 6 (six) hours as needed for moderate pain, mild pain, fever or headache.    [provider]  albuterol (PROVENTIL HFA;VENTOLIN HFA) 108 (90 Base) MCG/ACT inhaler Inhale 2 puffs into the lungs every 6 (six) hours as needed for wheezing or shortness of breath. 09/21/17   Purohit, Konrad Dolores, MD  atorvastatin (LIPITOR) 80 MG tablet Take 1 tablet (80 mg total) by mouth daily. 04/03/22   Bonnielee Haff, MD  buPROPion (WELLBUTRIN XL) 150 MG 24 hr tablet Take 150 mg by mouth daily. 01/15/21   [provider]  clopidogrel (PLAVIX) 75 MG tablet Take 1 tablet by mouth daily. 10/23/21   [provider]  ibuprofen (  ADVIL) 200 MG tablet Take 200 mg by mouth every 6 (six) hours as needed for mild pain, moderate pain or headache.    [provider]  losartan (COZAAR) 50 MG tablet Take 2 tablets (100 mg total) by mouth daily. 03/29/22   Bonnielee Haff, MD  mometasone-formoterol [facility]) 100-5 MCG/ACT AERO Inhale 2 puffs into the lungs 2 (two) times daily. 03/29/22   Bonnielee Haff, MD  Multiple Vitamins-Minerals (CENTRUM SILVER 50+MEN) TABS Take 1 tablet by mouth daily with breakfast.    [provider]  nicotine (NICODERM CQ - DOSED IN MG/24 HOURS) 14 mg/24hr patch Place 14 mg onto the skin daily.    [provider]  predniSONE (DELTASONE) 20 MG tablet Take 3 tablets once daily for 3 days followed by 2 tablets once daily for 3 days followed by 1 tablet once daily for 3 days and then stop 03/29/22   Bonnielee Haff, MD    Physical Exam: Vitals:   04/08/22 2245 04/08/22 2300 04/08/22 2315 04/08/22 2330  BP: (!) 150/103 (!) 147/95 (!) 152/94 (!) 158/95  Pulse: 76 77 78 77  Resp: (!) 21 (!) 22 (!) 23 20  Temp:      TempSrc:      SpO2: 95% 96% 96% 96%  Weight:      Height:        Constitutional: NAD, calm  Eyes: PERTLA, lids and conjunctivae normal ENMT: Mucous membranes are moist. Posterior pharynx clear of any exudate or lesions.   Neck: supple, no masses  Respiratory: no wheezing, no crackles. No accessory muscle use.  Cardiovascular: S1 & S2 heard, regular rate and rhythm. No extremity edema.   Abdomen: No distension, no tenderness, soft. Bowel sounds active.  Musculoskeletal: no clubbing / cyanosis. No joint deformity upper and lower extremities.   Skin: no significant rashes, lesions, ulcers. Warm, dry, well-perfused. Neurologic: Mild dysarthria, no aphasia. Left-sided weakness. Alert and oriented.  Psychiatric: Pleasant. Cooperative.    Labs and Imaging on Admission: I have personally reviewed following labs and imaging studies  CBC: Recent Labs  Lab 04/08/22 1655  WBC 16.6*  NEUTROABS 15.3*  HGB 13.2  HCT 39.7  MCV 92.5  PLT 786*   Basic Metabolic Panel: Recent Labs  Lab 04/08/22 1655  NA 133*  K 4.7  CL 99  CO2 25  GLUCOSE 194*  BUN 25*  CREATININE 1.23  CALCIUM 9.1   GFR: Estimated Creatinine Clearance: 50.8 mL/min (by C-G formula based on SCr of 1.23 mg/dL). Liver Function Tests: Recent Labs  Lab 04/08/22 1655  AST 10*  ALT 32  ALKPHOS 51  BILITOT 0.7  PROT 5.9*  ALBUMIN 2.9*   No results for input(s): "LIPASE",  "AMYLASE" in the last 168 hours. No results for input(s): "AMMONIA" in the last 168 hours. Coagulation Profile: Recent Labs  Lab 04/08/22 1655  INR 1.1   Cardiac Enzymes: No results for input(s): "CKTOTAL", "CKMB", "CKMBINDEX", "TROPONINI" in the last 168 hours. BNP (last 3 results) No results for input(s): "PROBNP" in the last 8760 hours. HbA1C: No results for input(s): "HGBA1C" in the last 72 hours. CBG: No results for input(s): "GLUCAP" in the last 168 hours. Lipid Profile: No results for input(s): "CHOL", "HDL", "LDLCALC", "TRIG", "CHOLHDL", "LDLDIRECT" in the last 72 hours. Thyroid Function Tests: No results for input(s): "TSH", "T4TOTAL", "FREET4", "T3FREE", "THYROIDAB" in the last 72 hours. Anemia Panel: No results for input(s): "VITAMINB12", "FOLATE", "FERRITIN", "TIBC", "IRON", "RETICCTPCT" in the last 72 hours. Urine analysis:  Component Value Date/Time   COLORURINE YELLOW 04/08/2022 2150   APPEARANCEUR CLEAR 04/08/2022 2150   LABSPEC 1.013 04/08/2022 2150   PHURINE 6.0 04/08/2022 2150   GLUCOSEU NEGATIVE 04/08/2022 2150   HGBUR NEGATIVE 04/08/2022 2150   BILIRUBINUR NEGATIVE 04/08/2022 2150   KETONESUR NEGATIVE 04/08/2022 2150   PROTEINUR NEGATIVE 04/08/2022 2150   NITRITE NEGATIVE 04/08/2022 2150   LEUKOCYTESUR TRACE (A) 04/08/2022 2150   Sepsis Labs: @LABRCNTIP (procalcitonin:4,lacticidven:4) ) Recent Results (from the past 240 hour(s))  Resp Panel by RT-PCR (Flu A&B, Covid) Anterior Nasal Swab     Status: None   Collection Time: 04/08/22  4:40 PM   Specimen: Anterior Nasal Swab  Result Value Ref Range Status   SARS Coronavirus 2 by RT PCR NEGATIVE NEGATIVE Final    Comment: (NOTE) SARS-CoV-2 target nucleic acids are NOT DETECTED.  The SARS-CoV-2 RNA is generally detectable in upper respiratory specimens during the acute phase of infection. The lowest concentration of SARS-CoV-2 viral copies this assay can detect is 138 copies/mL. A negative result  does not preclude SARS-Cov-2 infection and should not be used as the sole basis for treatment or other patient management decisions. A negative result may occur with  improper specimen collection/handling, submission of specimen other than nasopharyngeal swab, presence of viral mutation(s) within the areas targeted by this assay, and inadequate number of viral copies(<138 copies/mL). A negative result must be combined with clinical observations, patient history, and epidemiological information. The expected result is Negative.  Fact Sheet for Patients:  EntrepreneurPulse.com.au  Fact Sheet for Healthcare Providers:  IncredibleEmployment.be  This test is no t yet approved or cleared by the [place] FDA and  has been authorized for detection and/or diagnosis of SARS-CoV-2 by FDA under an Emergency Use Authorization (EUA). This EUA will remain  in effect (meaning this test can be used) for the duration of the COVID-19 declaration under Section 564(b)(1) of the Act, 21 U.S.C.section 360bbb-3(b)(1), unless the authorization is terminated  or revoked sooner.       Influenza A by PCR NEGATIVE NEGATIVE Final   Influenza B by PCR NEGATIVE NEGATIVE Final    Comment: (NOTE) The Xpert Xpress SARS-CoV-2/FLU/RSV plus assay is intended as an aid in the diagnosis of influenza from Nasopharyngeal swab specimens and should not be used as a sole basis for treatment. Nasal washings and aspirates are unacceptable for Xpert Xpress SARS-CoV-2/FLU/RSV testing.  Fact Sheet for Patients: EntrepreneurPulse.com.au  Fact Sheet for Healthcare Providers: IncredibleEmployment.be  This test is not yet approved or cleared by the [place] FDA and has been authorized for detection and/or diagnosis of SARS-CoV-2 by FDA under an Emergency Use Authorization (EUA). This EUA will remain in effect (meaning this test can be used) for  the duration of the COVID-19 declaration under Section 564(b)(1) of the Act, 21 U.S.C. section 360bbb-3(b)(1), unless the authorization is terminated or revoked.  Performed at [facility], [place] [address] [place], [place] [address]   MRSA Next Gen by PCR, Nasal     Status: None   Collection Time: 04/08/22  8:19 PM   Specimen: Nasal Mucosa; Nasal Swab  Result Value Ref Range Status   MRSA by PCR Next Gen NOT DETECTED NOT DETECTED Final    Comment: (NOTE) The GeneXpert MRSA Assay (FDA approved for NASAL specimens only), is one component of a comprehensive MRSA colonization surveillance program. It is not intended to diagnose MRSA infection nor to guide or monitor treatment for MRSA infections. Test performance is not FDA approved  in patients less than 53 years old. Performed at [facility], [place] [address] [place], [place] [address]      Radiological Exams on Admission: CT Head Wo Contrast  Result Date: 04/08/2022 CLINICAL DATA:  Altered mental status, recent COVID EXAM: CT HEAD WITHOUT CONTRAST TECHNIQUE: Contiguous axial images were obtained from the base of the skull through the vertex without intravenous contrast. RADIATION DOSE REDUCTION: This exam was performed according to the departmental dose-optimization program which includes automated exposure control, adjustment of the mA and/or kV according to patient size and/or use of iterative reconstruction technique. COMPARISON:  03/27/2021 FINDINGS: Brain: No evidence of acute infarction, hemorrhage, hydrocephalus, extra-axial collection or mass lesion/mass effect. Global cortical atrophy. Subcortical white matter and periventricular small vessel ischemic changes. Vascular: Intracranial atherosclerosis. Skull: Normal. Negative for fracture or focal lesion. Sinuses/Orbits: The visualized paranasal sinuses are essentially clear. The mastoid air cells are unopacified. Other: None. IMPRESSION: No acute intracranial abnormality.  Atrophy with small vessel ischemic changes. Electronically Signed   By: Julian Hy M.D.   On: 04/08/2022 21:04   DG Chest Port 1 View  Result Date: 04/08/2022 CLINICAL DATA:  Questionable sepsis - evaluate for abnormality EXAM: PORTABLE CHEST 1 VIEW COMPARISON:  March 25, 2022 FINDINGS: The cardiomediastinal silhouette is unchanged in contour.Atherosclerotic calcifications. Cardiac loop recorder. No pleural effusion. No pneumothorax. No acute pleuroparenchymal abnormality. Visualized abdomen is unremarkable. IMPRESSION: No acute cardiopulmonary abnormality. Electronically Signed   By: Valentino Saxon M.D.   On: 04/08/2022 17:32    EKG: Independently reviewed. Ectopic atrial rhythm.   Assessment/Plan   1. Hypotension; SIRS  - BP was 82/52 at PCP office and 78/40 with EMS  - Given 800 mL NS with EMS and BP normalized prior to arrival  - He has leukocytosis (just completed a course of prednisone) and transient tachypnea in ED but no obvious source of infection, no s/s of bleeding, and no chest pain  - Likely d/t hypovolemia and recent doubling of losartan dose  - He was cultured in ED and started on broad-spectrum antibiotics  - Hold antihypertensives, continue antibiotics for now, follow cultures and clinical course    2. Acute encephalopathy   - Resolved  - He was mildly confused while hypotensive prior to arrival in ED but now back to baseline  - No acute findings on head CT, was likely from hypotension    3. COPD; recent COVID with hypoxia - Requiring 2 Lpm supplemental O2 since COVID infection last month   - Not in COPD exacerbation on admission  - Continue ICS/LABA and as-needed albuterol   4. Hx of CVA  - No acute findings on head CT in ED and he is at his neurologic baseline on admission  - Continue Lipitor and Plavix   5. HTN  - He was hypotensive in PCP office with EMS pta and antihypertensives held initially    6. Urinary retention  - Foley placed in ED  -  Urine dose not appear infected and he is not on medications associated with retention  - Voiding trial    DVT prophylaxis: Lovenox  Code Status: Full  Level of Care: Level of care: Telemetry Medical Family Communication: Daughter and grandson at bedside  Disposition Plan:  Patient is from: home  Anticipated d/c is to: TBD Anticipated d/c date is: Possibly as early as 04/10/22  Patient currently: Stable BP, cultures, PT eval  Consults called: none  Admission status: Observation     Vianne Bulls, MD  [facility]  04/09/2022, 1:14 AM

## 2022-04-09 ENCOUNTER — Observation Stay (HOSPITAL_BASED_OUTPATIENT_CLINIC_OR_DEPARTMENT_OTHER): Payer: Medicare Other

## 2022-04-09 ENCOUNTER — Observation Stay (HOSPITAL_COMMUNITY): Payer: Medicare Other

## 2022-04-09 DIAGNOSIS — E861 Hypovolemia: Secondary | ICD-10-CM | POA: Diagnosis not present

## 2022-04-09 DIAGNOSIS — R55 Syncope and collapse: Secondary | ICD-10-CM

## 2022-04-09 DIAGNOSIS — I9589 Other hypotension: Secondary | ICD-10-CM | POA: Diagnosis not present

## 2022-04-09 DIAGNOSIS — D649 Anemia, unspecified: Secondary | ICD-10-CM

## 2022-04-09 DIAGNOSIS — R195 Other fecal abnormalities: Secondary | ICD-10-CM | POA: Diagnosis not present

## 2022-04-09 DIAGNOSIS — Z7902 Long term (current) use of antithrombotics/antiplatelets: Secondary | ICD-10-CM

## 2022-04-09 LAB — ECHOCARDIOGRAM COMPLETE
Area-P 1/2: 7.82 cm2
Calc EF: 68.5 %
Height: 68 in
S' Lateral: 1.8 cm
Single Plane A2C EF: 70.7 %
Single Plane A4C EF: 70.2 %
Weight: 2992 oz

## 2022-04-09 LAB — COMPREHENSIVE METABOLIC PANEL
ALT: 29 U/L (ref 0–44)
AST: 9 U/L — ABNORMAL LOW (ref 15–41)
Albumin: 2.5 g/dL — ABNORMAL LOW (ref 3.5–5.0)
Alkaline Phosphatase: 42 U/L (ref 38–126)
Anion gap: 9 (ref 5–15)
BUN: 15 mg/dL (ref 8–23)
CO2: 25 mmol/L (ref 22–32)
Calcium: 8.5 mg/dL — ABNORMAL LOW (ref 8.9–10.3)
Chloride: 101 mmol/L (ref 98–111)
Creatinine, Ser: 0.8 mg/dL (ref 0.61–1.24)
GFR, Estimated: >60 mL/min (ref >60)
Glucose, Bld: 87 mg/dL (ref 70–99)
Potassium: 4.4 mmol/L (ref 3.5–5.1)
Sodium: 135 mmol/L (ref 135–145)
Total Bilirubin: 0.7 mg/dL (ref 0.3–1.2)
Total Protein: 5.1 g/dL — ABNORMAL LOW (ref 6.5–8.1)

## 2022-04-09 LAB — PHOSPHORUS: Phosphorus: 4.1 mg/dL (ref 2.5–4.6)

## 2022-04-09 LAB — CBC
HCT: 33.8 % — ABNORMAL LOW (ref 39.0–52.0)
Hemoglobin: 11.3 g/dL — ABNORMAL LOW (ref 13.0–17.0)
MCH: 30.9 pg (ref 26.0–34.0)
MCHC: 33.4 g/dL (ref 30.0–36.0)
MCV: 92.3 fL (ref 80.0–100.0)
Platelets: 471 10*3/uL — ABNORMAL HIGH (ref 150–400)
RBC: 3.66 MIL/uL — ABNORMAL LOW (ref 4.22–5.81)
RDW: 14 % (ref 11.5–15.5)
WBC: 17 10*3/uL — ABNORMAL HIGH (ref 4.0–10.5)
nRBC: 0 % (ref 0.0–0.2)

## 2022-04-09 LAB — CBC WITH DIFFERENTIAL/PLATELET
Abs Immature Granulocytes: 0.08 10*3/uL — ABNORMAL HIGH (ref 0.00–0.07)
Basophils Absolute: 0 10*3/uL (ref 0.0–0.1)
Basophils Relative: 0 %
Eosinophils Absolute: 0.1 10*3/uL (ref 0.0–0.5)
Eosinophils Relative: 1 %
HCT: 36.1 % — ABNORMAL LOW (ref 39.0–52.0)
Hemoglobin: 12 g/dL — ABNORMAL LOW (ref 13.0–17.0)
Immature Granulocytes: 1 %
Lymphocytes Relative: 5 %
Lymphs Abs: 0.9 10*3/uL (ref 0.7–4.0)
MCH: 30.8 pg (ref 26.0–34.0)
MCHC: 33.2 g/dL (ref 30.0–36.0)
MCV: 92.6 fL (ref 80.0–100.0)
Monocytes Absolute: 0.9 10*3/uL (ref 0.1–1.0)
Monocytes Relative: 5 %
Neutro Abs: 15.3 10*3/uL — ABNORMAL HIGH (ref 1.7–7.7)
Neutrophils Relative %: 88 %
Platelets: 483 10*3/uL — ABNORMAL HIGH (ref 150–400)
RBC: 3.9 MIL/uL — ABNORMAL LOW (ref 4.22–5.81)
RDW: 14.2 % (ref 11.5–15.5)
WBC: 17.4 10*3/uL — ABNORMAL HIGH (ref 4.0–10.5)
nRBC: 0 % (ref 0.0–0.2)

## 2022-04-09 LAB — ABO/RH: ABO/RH(D): B POS

## 2022-04-09 LAB — D-DIMER, QUANTITATIVE: D-Dimer, Quant: 1.28 ug/mL-FEU — ABNORMAL HIGH (ref 0.00–0.50)

## 2022-04-09 LAB — TYPE AND SCREEN
ABO/RH(D): B POS
Antibody Screen: NEGATIVE

## 2022-04-09 LAB — TROPONIN I (HIGH SENSITIVITY)
Troponin I (High Sensitivity): 12 ng/L (ref <18)
Troponin I (High Sensitivity): 12 ng/L (ref <18)

## 2022-04-09 LAB — MAGNESIUM: Magnesium: 2 mg/dL (ref 1.7–2.4)

## 2022-04-09 MED ORDER — IOHEXOL 350 MG/ML SOLN
75.0000 mL | Freq: Once | INTRAVENOUS | Status: AC | PRN
  Administered 2022-04-09: 75 mL via INTRAVENOUS

## 2022-04-09 MED ORDER — LACTATED RINGERS IV SOLN: INTRAVENOUS | Status: DC

## 2022-04-09 MED ORDER — PANTOPRAZOLE SODIUM 40 MG IV SOLR
40.0000 mg | Freq: Two times a day (BID) | INTRAVENOUS | Status: DC
  Administered 2022-04-09 – 2022-04-13 (×9): 40 mg via INTRAVENOUS
  Filled 2022-04-09 (×9): qty 10

## 2022-04-09 MED ORDER — SODIUM CHLORIDE 0.9 % IV BOLUS
1000.0000 mL | Freq: Once | INTRAVENOUS | Status: AC
  Administered 2022-04-09: 1000 mL via INTRAVENOUS

## 2022-04-09 MED ORDER — LOSARTAN POTASSIUM 50 MG PO TABS
25.0000 mg | ORAL_TABLET | Freq: Every day | ORAL | Status: DC
  Administered 2022-04-09: 25 mg via ORAL
  Filled 2022-04-09: qty 1

## 2022-04-09 MED ORDER — TAMSULOSIN HCL 0.4 MG PO CAPS
0.4000 mg | ORAL_CAPSULE | Freq: Every day | ORAL | Status: DC
  Administered 2022-04-09 – 2022-04-13 (×4): 0.4 mg via ORAL
  Filled 2022-04-09 (×5): qty 1

## 2022-04-09 MED ORDER — LABETALOL HCL 5 MG/ML IV SOLN: 5.0000 mg | INTRAVENOUS | Status: DC | PRN

## 2022-04-09 NOTE — Progress Notes (Addendum)
PROGRESS NOTE    Adrian Rodgers  YKD:[id number] DOB: 05-02-1941 DOA: 04/08/2022 PCP: Pcp, No     Brief Narrative:   CVA, hypertension, COPD, and admission last month for COVID with acute hypoxic respiratory failure, now presenting to the emergency department with loss of appetite, generalized weakness, mild confusion, and hypotension.  Subjective:  He appear weak and drowsy but oriented x3 Daughter at bedside  Has urinary retention, foley placed due to difficulty doing in and out cath per RN, blood tinged urine in canister, initial ua no blood reported, urine culture in place Dark stool, FOBT+, denies ab pain, recent h/o steroids use for covid, gi consulted   vasovagal event while on the commode, vital sign normalized after laying back to bed, ns bolus, LR maintenance fluids, check cbc  Assessment & Plan:  Principal Problem:   Hypotension due to hypovolemia Active Problems:   Acute respiratory failure due to COVID-19 (HCC)   Other emphysema (HCC)   History of CVA (cerebrovascular accident)   Essential hypertension   SIRS (systemic inflammatory response syndrome) (HCC)   Acute encephalopathy  Hypotension/leukocytosis -Probably combination from dehydration, relatively adrenal insufficiency from recent steroid use -Hold Cozaar, continue hydration -Persistent leukocytosis, continue empiric antibiotic, follow-up on culture result -Repeat labs in the morning  Acute urinary retention 800 cc urine drained from Foley Start Flomax Will need voiding trial prior to discharge  Blood in urine, suspect Foley trauma, and initial UA did not show blood in urine May consider urology consult if hematuria persists  Dark black occult blood test + stool Recent steroids use, also reports alcohol use Hold plavix, start ppi, gi consulted  H/o CVA with Has residual left-sided weakness.   Plavix held due to blood in stool,  Continue statin  Mood disorder P.o. bupropion  Tobacco  use One pack a day, nicotine patch  Physical deconditioning PT eval, reports previously lives alone, but progressive weakness after covid infection     Addendum, RN report patient had hypoxia, 02 dropped to 89%, he was put on 3liter oxygen, cxr no acute findings, will check tropoinin, echo, CTA chest to rule out PE, as recent h/o covid infection    I have Reviewed nursing notes, Vitals, pain scores, I/o's, Lab results and  imaging results since pt's last encounter, details please see discussion above  I ordered the following labs:  Unresulted Labs (From admission, onward)     Start     Ordered   04/15/22 0500  Creatinine, serum  (enoxaparin (LOVENOX)    CrCl >/= 30 ml/min)  Weekly,   R     Comments: while on enoxaparin therapy    04/08/22 2319   04/10/22 0500  CBC with Differential/Platelet  Tomorrow morning,   R        04/09/22 0800   04/10/22 0500  Basic metabolic panel  Tomorrow morning,   R        04/09/22 0800   04/08/22 1640  Blood Culture (routine x 2)  (Septic presentation on arrival (screening labs, nursing and treatment orders for obvious sepsis))  BLOOD CULTURE X 2,   STAT      04/08/22 1640   04/08/22 1640  Urine Culture  (Septic presentation on arrival (screening labs, nursing and treatment orders for obvious sepsis))  ONCE - URGENT,   URGENT       Question:  Indication  Answer:  Dysuria   04/08/22 1640             DVT prophylaxis: enoxaparin (  LOVENOX) injection 40 mg Start: 04/09/22 1000   Code Status:   Code Status: Full Code  Family Communication: Family at bedside Disposition:    Dispo: The patient is from: home, ( recently discharged from SNF)              Anticipated d/c is to: TBD              Anticipated d/c date is: TBD > 24hrs, needs EGD, needs voiding trial, need Pt eval, needs wbc to improve, follow up on culture result  Antimicrobials:   Anti-infectives (From admission, onward)    Start     Dose/Rate Route Frequency Ordered Stop    04/09/22 0900  vancomycin (VANCOREADY) IVPB 1250 mg/250 mL  Status:  Discontinued        1,250 mg 166.7 mL/hr over 90 Minutes Intravenous Every 24 hours 04/08/22 1803 04/09/22 1201   04/09/22 0600  ceFEPIme (MAXIPIME) 2 g in sodium chloride 0.9 % 100 mL IVPB        2 g 200 mL/hr over 30 Minutes Intravenous Every 12 hours 04/08/22 1803     04/08/22 2330  metroNIDAZOLE (FLAGYL) IVPB 500 mg        500 mg 100 mL/hr over 60 Minutes Intravenous Every 12 hours 04/08/22 2319 04/15/22 2159   04/08/22 1700  vancomycin (VANCOREADY) IVPB 1750 mg/350 mL        1,750 mg 175 mL/hr over 120 Minutes Intravenous  Once 04/08/22 1647 04/08/22 2320   04/08/22 1645  vancomycin (VANCOCIN) IVPB 1000 mg/200 mL premix  Status:  Discontinued        1,000 mg 200 mL/hr over 60 Minutes Intravenous  Once 04/08/22 1640 04/08/22 1647   04/08/22 1645  ceFEPIme (MAXIPIME) 2 g in sodium chloride 0.9 % 100 mL IVPB        2 g 200 mL/hr over 30 Minutes Intravenous  Once 04/08/22 1640 04/08/22 1741           Objective: Vitals:   04/09/22 0700 04/09/22 0715 04/09/22 0746 04/09/22 0749  BP: (!) 151/89 (!) 163/96    Pulse: 81 80    Resp: (!) 24 (!) 25    Temp:   98.7 F (37.1 C) 98.7 F (37.1 C)  TempSrc:   Oral Oral  SpO2: 96% 97%    Weight:      Height:        Intake/Output Summary (Last 24 hours) at 04/09/2022 0801 Last data filed at 04/09/2022 0636 Gross per 24 hour  Intake 1596.68 ml  Output --  Net 1596.68 ml   Filed Weights   04/08/22 1641  Weight: 84.8 kg    Examination:  General exam: Appear weak, drowsy, but oriented x3 Respiratory system: Clear to auscultation. Respiratory effort normal. Cardiovascular system:  RRR.  Gastrointestinal system: Abdomen is nondistended, soft and nontender.  Normal bowel sounds heard. Central nervous system: orientedx3, right lower extremity weakness from prior stroke Extremities:  no edema Skin: No rashes, lesions or ulcers Psychiatry: Lethargic but calm  and cooperative    Data Reviewed: I have personally reviewed  labs and visualized  imaging studies since the last encounter and formulate the plan        Scheduled Meds:  atorvastatin  80 mg Oral Daily   clopidogrel  75 mg Oral Daily   enoxaparin (LOVENOX) injection  40 mg Subcutaneous Q24H   mometasone-formoterol  2 puff Inhalation BID   sodium chloride flush  3 mL Intravenous Q12H   Continuous  Infusions:  ceFEPime (MAXIPIME) IV Stopped (04/09/22 0636)   metronidazole Stopped (04/09/22 0157)   vancomycin       LOS: 0 days     Albertine Grates, MD PhD FACP [facility]  Available via Epic secure chat 7am-7pm for nonurgent issues Please page for urgent issues To page the attending provider between 7A-7P or the covering provider during after hours 7P-7A, please log into the web site [email or web address] and access using universal [place] password for that web site. If you do not have the password, please call the hospital operator.    04/09/2022, 8:01 AM

## 2022-04-09 NOTE — ED Notes (Signed)
Pt was on the bedside commode trying to have a bowel movement, pt had a syncope episode. Pt was found passed out on the beside commode. Pt put back on the bed. BP was 74/53. BP is now 116/76. Bolus of NS started and pt is continued being monitored. Md notified.

## 2022-04-09 NOTE — Progress Notes (Signed)
  Echocardiogram 2D Echocardiogram has been performed.  Adrian Rodgers 04/09/2022, 3:20 PM

## 2022-04-09 NOTE — Consult Note (Signed)
[facility] Consult: 12:27 PM 04/09/2022  LOS: 0 days    Referring Provider: Dr Roda Shutters  Primary Care Physician:  Elsie Amis, MD has pending appointment to establish with a PCP named Thressa Sheller. Primary Gastroenterologist:  None.       Reason for Consultation:  FOBT +.  Black stool.     HPI: Adrian Rodgers is a 81 y.o. male.  PMH emphysema.  CVA, residual left sided weakness.  Chronic Plavix, on hold, last dose 10/9.  Depression.  Chronic back pain.  Tobacco abuse ongoing.  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  Cardiac loop monitoring device in situ. Remote colonoscopy, no details.  This was probably at least 10 years ago.  Unable to locate any records of this.  Admitted 9/25-9/30/23 for COVID associated hypoxic respiratory failure but no pneumonia on x-ray.  Received Paxlovid.  Discharged on home oxygen.  Came into the ED this morning for evaluation anorexia, increased from baseline L sided weakness, mild confusion, hypotension.  Found to have acute urinary retention, 800 cc bloody urine drained after challenging placement of Foley, unremarkable urinalysis (only 0-5 RBCs on initial spec, Flomax initiated. Was hypotensive with elevated WBC count.  Felt to be dehydrated.  Started on empiric antibiotics.  Dtr Leanord Hawking [phone], informs me that for many years he has had stable blood per rectum that has been attributed to hemorrhoids.  Patient's daughter, who helps him do the laundry has noted moderate amounts of blood in his underwear.  This bleeding has not accelerated.  His physicians have sent out Cologuard kits but patient has yet to submit stool specimen for the Cologuard test.  He tends to constipation but manages mostly daily bowel  movements but with straining. Since his discharge late last month, his appetite has been depressed.  He is also generally fatigued and mostly lays around on the couch whereas before he was a bit more active and was independent for his ADLs.  Family has been helping out more since he came home from the hospital.  FOBT + Hgb 13.2... 11.3 after IV fluids.  WBC 17 K.  Platelets 471. Na 133... 135.  Glucose 194.  GFR 59... >60.   No elevation LFTs.  Albumin low at 2.5. Blood cultures in progress. Head CT shows small vessel ischemia and atrophy but nothing acute.  Just before I came into the room, the patient had been on the bedside commode and had a brief syncopal spell with hypotension.  He is getting bolus of IV fluids currently.  Family history negative for colorectal cancer, PUD, GI bleeding, anemia. Patient lives alone in [place].  4 children.  One of his daughters had very recently been diagnosed with ALS and died suddenly yesterday morning of unclear cause.  Family feels this event is contributing to patient's current status. Still smokes, 1 pack/day.  Rare beer.  Past Medical History:  Diagnosis Date   Community acquired pneumonia of right middle lobe of lung 09/20/2017   High cholesterol  Hypertension    Sepsis (HCC) 09/19/2017   Stroke (HCC) 05/01/2020   Tobacco dependence     Past Surgical History:  Procedure Laterality Date   implantable loop recorder placement  06/05/2020   [employer] Reveal [place] model [id number] [phone]) implantable loop recorder     Prior to Admission medications   Medication Sig Start Date End Date Taking? Authorizing Provider  acetaminophen (TYLENOL) 500 MG tablet Take 1,000 mg by mouth every 6 (six) hours as needed for moderate pain, mild pain, fever or headache.    [provider]  albuterol (PROVENTIL HFA;VENTOLIN HFA) 108 (90 Base) MCG/ACT inhaler Inhale 2 puffs into the lungs every 6 (six) hours as needed for wheezing or shortness of  breath. 09/21/17   Purohit, Salli Quarry, MD  atorvastatin (LIPITOR) 80 MG tablet Take 1 tablet (80 mg total) by mouth daily. 04/03/22   Osvaldo Shipper, MD  buPROPion (WELLBUTRIN XL) 150 MG 24 hr tablet Take 150 mg by mouth daily. 01/15/21   [provider]  clopidogrel (PLAVIX) 75 MG tablet Take 1 tablet by mouth daily. 10/23/21   [provider]  ibuprofen (ADVIL) 200 MG tablet Take 200 mg by mouth every 6 (six) hours as needed for mild pain, moderate pain or headache.    [provider]  losartan (COZAAR) 50 MG tablet Take 2 tablets (100 mg total) by mouth daily. 03/29/22   Osvaldo Shipper, MD  mometasone-formoterol [facility]) 100-5 MCG/ACT AERO Inhale 2 puffs into the lungs 2 (two) times daily. 03/29/22   Osvaldo Shipper, MD  Multiple Vitamins-Minerals (CENTRUM SILVER 50+MEN) TABS Take 1 tablet by mouth daily with breakfast.    [provider]  nicotine (NICODERM CQ - DOSED IN MG/24 HOURS) 14 mg/24hr patch Place 14 mg onto the skin daily.    [provider]  predniSONE (DELTASONE) 20 MG tablet Take 3 tablets once daily for 3 days followed by 2 tablets once daily for 3 days followed by 1 tablet once daily for 3 days and then stop 03/29/22   Osvaldo Shipper, MD    Scheduled Meds:  atorvastatin  80 mg Oral Daily   enoxaparin (LOVENOX) injection  40 mg Subcutaneous Q24H   losartan  25 mg Oral Daily   mometasone-formoterol  2 puff Inhalation BID   pantoprazole (PROTONIX) IV  40 mg Intravenous Q12H   sodium chloride flush  3 mL Intravenous Q12H   tamsulosin  0.4 mg Oral Daily   Infusions:  ceFEPime (MAXIPIME) IV Stopped (04/09/22 0636)   metronidazole Stopped (04/09/22 1204)   PRN Meds: acetaminophen **OR** acetaminophen, albuterol, ondansetron **OR** ondansetron (ZOFRAN) IV, polyethylene glycol   Allergies as of 04/08/2022   (No Known Allergies)    Family History  Family history unknown: Yes    Social History   Socioeconomic History   Marital  status: Divorced    Spouse name: Not on file   Number of children: Not on file   Years of education: Not on file   Highest education level: Not on file  Occupational History   Occupation: retired  Tobacco Use   Smoking status: Every Day    Packs/day: 1.00    Years: 20.00    Total pack years: 20.00    Types: Cigarettes   Smokeless tobacco: Never  Vaping Use   Vaping Use: Never used  Substance and Sexual Activity   Alcohol use: Yes    Alcohol/week: 1.0 standard drink of alcohol    Types: 1 Cans of beer per week  Comment: remote h/o heavy use   Drug use: Never   Sexual activity: Not on file  Other Topics Concern   Not on file  Social History Narrative   Not on file   Social Determinants of Health   Financial Resource Strain: Not on file  Food Insecurity: No Food Insecurity (03/25/2022)   Hunger Vital Sign    Worried About Running Out of Food in the Last Year: Never true    Ran Out of Food in the Last Year: Never true  Transportation Needs: No Transportation Needs (03/25/2022)   PRAPARE - Hydrologist (Medical): No    Lack of Transportation (Non-Medical): No  Physical Activity: Not on file  Stress: Not on file  Social Connections: Not on file  Intimate Partner Violence: Not At Risk (03/25/2022)   Humiliation, Afraid, Rape, and Kick questionnaire    Fear of Current or Ex-Partner: No    Emotionally Abused: No    Physically Abused: No    Sexually Abused: No    REVIEW OF SYSTEMS: Constitutional: Fatigue, weakness as per HPI. ENT:  No nose bleeds Pulm: No active cough.  No dyspnea. CV:  No palpitations, no LE edema.  No angina. GU:  No hematuria, no frequency GI: See HPI. Heme: No unusual or excessive bleeding or bruising. Transfusions: No previous transfusion with blood products. Neuro: Syncopal spell just before I arrived in his room.  No headaches, no peripheral tingling or numbness Derm:  No itching, no rash or sores.  Endocrine:  No  sweats or chills.  No polyuria or dysuria Immunization: Reviewed.  He had been fully vaccinated, boosted for COVID with the exception of had not received the new booster Travel:  None   PHYSICAL EXAM: Vital signs in last 24 hours: Vitals:   04/09/22 1131 04/09/22 1145  BP:  115/71  Pulse:  (!) 108  Resp:  (!) 26  Temp: 98.7 F (37.1 C) 98.6 F (37 C)  SpO2:  93%   Wt Readings from Last 3 Encounters:  04/08/22 84.8 kg  03/25/22 81.9 kg  12/04/21 83.5 kg    General: Patient aged, looks frail.  Lethargic.  Does not look toxic. Head: No facial asymmetry or swelling.  No signs of head trauma. Eyes: No conjunctival pallor or scleral icterus. Ears: Not hard of hearing Nose: No congestion or discharge Mouth: Mucosa is moist, pink, clear.  Edentulous.  Tongue midline. Neck: No JVD, no masses, no thyromegaly Lungs: No dyspnea, no labored breathing, no cough.  Clear bilaterally though overall breath sounds a bit diminished. Heart: RRR.  No MRG.  S1, S2 present Abdomen: Soft without distention.  Active bowel sounds.  No HSM, masses, bruits, hernias..   Rectal: No visible or palpable masses.  Liquid to very soft unformed stool is dark and strongly FOBT positive.  Does not smell of melena.  The anterior aspect of the rectum is very firm but do not palpate enlarged prostate or masses. GU: bloody urine in foley, blood on penis Musc/Skeltl: No joint redness, swelling or gross deformity. Extremities: No CCE. Neurologic: Follows commands.  Moves all 4 limbs.  No tremors.  Laconic.  Not able to answer questions regarding orientation such as where is he, year, situation. Skin: No rash, no sores, no suspicious lesions, no's significant purpura or bruising. Nodes: No cervical adenopathy Psych: Calm.  Intake/Output from previous day: 10/09 0701 - 10/10 0700 In: 1596.7 [I.V.:535; IV Piggyback:1061.7] Out: -  Intake/Output this shift: No intake/output  data recorded.  LAB RESULTS: Recent Labs     04/08/22 1655 04/09/22 0313  WBC 16.6* 17.0*  HGB 13.2 11.3*  HCT 39.7 33.8*  PLT 507* 471*   BMET Lab Results  Component Value Date   NA 135 04/09/2022   NA 133 (L) 04/08/2022   NA 134 (L) 03/28/2022   K 4.4 04/09/2022   K 4.7 04/08/2022   K 3.9 03/28/2022   CL 101 04/09/2022   CL 99 04/08/2022   CL 98 03/28/2022   CO2 25 04/09/2022   CO2 25 04/08/2022   CO2 29 03/28/2022   GLUCOSE 87 04/09/2022   GLUCOSE 194 (H) 04/08/2022   GLUCOSE 132 (H) 03/28/2022   BUN 15 04/09/2022   BUN 25 (H) 04/08/2022   BUN 16 03/28/2022   CREATININE 0.80 04/09/2022   CREATININE 1.23 04/08/2022   CREATININE 0.66 03/28/2022   CALCIUM 8.5 (L) 04/09/2022   CALCIUM 9.1 04/08/2022   CALCIUM 9.0 03/28/2022   LFT Recent Labs    04/08/22 1655 04/09/22 0313  PROT 5.9* 5.1*  ALBUMIN 2.9* 2.5*  AST 10* 9*  ALT 32 29  ALKPHOS 51 42  BILITOT 0.7 0.7   PT/INR Lab Results  Component Value Date   INR 1.1 04/08/2022   INR 1.0 03/25/2021   Hepatitis Panel No results for input(s): "HEPBSAG", "HCVAB", "HEPAIGM", "HEPBIGM" in the last 72 hours. C-Diff No components found for: "CDIFF" Lipase  No results found for: "LIPASE"  Drugs of Abuse     Component Value Date/Time   LABOPIA NONE DETECTED 03/26/2021 0600   COCAINSCRNUR NONE DETECTED 03/26/2021 0600   LABBENZ NONE DETECTED 03/26/2021 0600   AMPHETMU NONE DETECTED 03/26/2021 0600   THCU NONE DETECTED 03/26/2021 0600   LABBARB NONE DETECTED 03/26/2021 0600     RADIOLOGY STUDIES: CT Head Wo Contrast  Result Date: 04/08/2022 CLINICAL DATA:  Altered mental status, recent COVID EXAM: CT HEAD WITHOUT CONTRAST TECHNIQUE: Contiguous axial images were obtained from the base of the skull through the vertex without intravenous contrast. RADIATION DOSE REDUCTION: This exam was performed according to the departmental dose-optimization program which includes automated exposure control, adjustment of the mA and/or kV according to patient size  and/or use of iterative reconstruction technique. COMPARISON:  03/27/2021 FINDINGS: Brain: No evidence of acute infarction, hemorrhage, hydrocephalus, extra-axial collection or mass lesion/mass effect. Global cortical atrophy. Subcortical white matter and periventricular small vessel ischemic changes. Vascular: Intracranial atherosclerosis. Skull: Normal. Negative for fracture or focal lesion. Sinuses/Orbits: The visualized paranasal sinuses are essentially clear. The mastoid air cells are unopacified. Other: None. IMPRESSION: No acute intracranial abnormality. Atrophy with small vessel ischemic changes. Electronically Signed   By: Charline BillsSriyesh  Krishnan M.D.   On: 04/08/2022 21:04   DG Chest Port 1 View  Result Date: 04/08/2022 CLINICAL DATA:  Questionable sepsis - evaluate for abnormality EXAM: PORTABLE CHEST 1 VIEW COMPARISON:  March 25, 2022 FINDINGS: The cardiomediastinal silhouette is unchanged in contour.Atherosclerotic calcifications. Cardiac loop recorder. No pleural effusion. No pneumothorax. No acute pleuroparenchymal abnormality. Visualized abdomen is unremarkable. IMPRESSION: No acute cardiopulmonary abnormality. Electronically Signed   By: Meda KlinefelterStephanie  Peacock M.D.   On: 04/08/2022 17:32     IMPRESSION:   Dark, FOBT positive stool. No PPI, H2B PTA.  Protonix 40 iv bid now in place.       Chronic Plavix.  Last dose 10/9.  On hold.      Vasovagal syncopal event while on BS commode.      Urinary retention.  Hematuria following (  traumatic?) foley insertion.  No signif hematuria on initial UA  Anorexia, lethargy, FTT after recent admission for COVID-19 infection without pneumonia on x-ray then and today, treated with Paxlovid.  Background emphysema/COPD and ongoing tobacco abuse.  Empiric Flagyl, cefepime in place.  FTT, lethargy, anorexia since recent discharge.  Acute urinary retention.  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    Fatty liver per recent chest CT.  Other  than low albumin, LFTs normal.      Hx CVA w L sided weakness.  Acute worsening fot L weakness per family.  No acute changes on head CT.    PLAN:       Needs EGD, timing TBD.  Continue Protonix 40 mg every 12 hours. Going to switch diet to clears as precaution.  Follow HGB/CBC.     Jennye Moccasin  04/09/2022, 12:27 PM Phone [phone]

## 2022-04-09 NOTE — ED Notes (Signed)
Family requesting a urology consult d/t blood in urine, Admitting made aware of families concerns

## 2022-04-09 NOTE — Progress Notes (Signed)
PT Cancellation Note  Patient Details Name: Willia Lampert MRN: [id number] DOB: [birth year]   Cancelled Treatment:    Reason Eval/Treat Not Completed: Medical issues which prohibited therapy Pt with recent syncopal episode with nursing staff and about to head to floor. Will hold and follow up as schedule allows.   Lou Miner, DPT  Acute Rehabilitation Services  Office: [phone]    Rudean Hitt 04/09/2022, 3:25 PM

## 2022-04-09 NOTE — ED Notes (Signed)
ED TO INPATIENT HANDOFF REPORT  ED Nurse Name and Phone #: [phone]  S Name/Age/Gender Adrian Rodgers 81 y.o. male Room/Bed: 018C/018C  Code Status   Code Status: Full Code  Home/SNF/Other Home  Patient oriented to: self, place, time, and situation Is this baseline? Yes   Triage Complete: Triage complete  Chief Complaint Hypotension due to hypovolemia [I95.89, E86.1]  Triage Note Pt bib ems from [facility]; pt diagnosed with Covid at end of September; since then c/o decreased PO intake, generalized weakness, sob; unable to ambulate today due to weakness; while being evaluated by PCP, witnessed acute L sided weakness, L leg weakness, mild AMS; hx stroke x 1 year ago, weak on L side at baseline; pt endorses weakness worse than normal at MD office, but has since improved; now feels back to baseline; pt also found to be hypotensive, BP 78/40 initially; pt given 800 mls NS PTA; last bp 108 palp; denies dizziness, cp, sob at present; more responsive after bolus, answering questions; o2 sats initially upper 80s-low 90s RA; pt on 2L, sats now 95%; cbg 186, HR 96, 118/76, peaked T waves noted on ems 12 lead.    Allergies No Known Allergies  Level of Care/Admitting Diagnosis ED Disposition     ED Disposition  Admit   Condition  --   Comment  Hospital Area: [facility] [[id number]]  Level of Care: Telemetry Medical [[id number]]  May place patient in observation at [facility] or [facility] if equivalent level of care is available:: Yes  Covid Evaluation: Confirmed COVID Negative  Diagnosis: Hypotension due to hypovolemia [[id number]]  Admitting Physician: Briscoe Deutscher [[id number]]  Attending Physician: Briscoe Deutscher [[id number]]          B Medical/Surgery History Past Medical History:  Diagnosis Date   Community acquired pneumonia of right middle lobe of lung 09/20/2017   High cholesterol    Hypertension    Sepsis (HCC) 09/19/2017   Stroke (HCC)  05/01/2020   Tobacco dependence    Past Surgical History:  Procedure Laterality Date   implantable loop recorder placement  06/05/2020   Medtronic Reveal Vinco model [id number]) implantable loop recorder      A IV Location/Drains/Wounds Patient Lines/Drains/Airways Status     Active Line/Drains/Airways     Name Placement date Placement time Site Days   Peripheral IV 04/08/22 18 G Right Antecubital 04/08/22  --  Antecubital  1   Peripheral IV 04/08/22 20 G Left Antecubital 04/08/22  1653  Antecubital  1   Urethral Catheter Coude 22 Fr. 04/08/22  2148  Coude  1            Intake/Output Last 24 hours  Intake/Output Summary (Last 24 hours) at 04/09/2022 1511 Last data filed at 04/09/2022 0636 Gross per 24 hour  Intake 1596.68 ml  Output --  Net 1596.68 ml    Labs/Imaging Results for orders placed or performed during the hospital encounter of 04/08/22 (from the past 48 hour(s))  Resp Panel by RT-PCR (Flu A&B, Covid) Anterior Nasal Swab     Status: None   Collection Time: 04/08/22  4:40 PM   Specimen: Anterior Nasal Swab  Result Value Ref Range   SARS Coronavirus 2 by RT PCR NEGATIVE NEGATIVE    Comment: (NOTE) SARS-CoV-2 target nucleic acids are NOT DETECTED.  The SARS-CoV-2 RNA is generally detectable in upper respiratory specimens during the acute phase of infection. The lowest concentration of SARS-CoV-2 viral copies this assay can detect  is 138 copies/mL. A negative result does not preclude SARS-Cov-2 infection and should not be used as the sole basis for treatment or other patient management decisions. A negative result may occur with  improper specimen collection/handling, submission of specimen other than nasopharyngeal swab, presence of viral mutation(s) within the areas targeted by this assay, and inadequate number of viral copies(<138 copies/mL). A negative result must be combined with clinical observations, patient history, and  epidemiological information. The expected result is Negative.  Fact Sheet for Patients:  BloggerCourse.com  Fact Sheet for Healthcare Providers:  SeriousBroker.it  This test is no t yet approved or cleared by the [place] FDA and  has been authorized for detection and/or diagnosis of SARS-CoV-2 by FDA under an Emergency Use Authorization (EUA). This EUA will remain  in effect (meaning this test can be used) for the duration of the COVID-19 declaration under Section 564(b)(1) of the Act, 21 U.S.C.section 360bbb-3(b)(1), unless the authorization is terminated  or revoked sooner.       Influenza A by PCR NEGATIVE NEGATIVE   Influenza B by PCR NEGATIVE NEGATIVE    Comment: (NOTE) The Xpert Xpress SARS-CoV-2/FLU/RSV plus assay is intended as an aid in the diagnosis of influenza from Nasopharyngeal swab specimens and should not be used as a sole basis for treatment. Nasal washings and aspirates are unacceptable for Xpert Xpress SARS-CoV-2/FLU/RSV testing.  Fact Sheet for Patients: BloggerCourse.com  Fact Sheet for Healthcare Providers: SeriousBroker.it  This test is not yet approved or cleared by the [place] FDA and has been authorized for detection and/or diagnosis of SARS-CoV-2 by FDA under an Emergency Use Authorization (EUA). This EUA will remain in effect (meaning this test can be used) for the duration of the COVID-19 declaration under Section 564(b)(1) of the Act, 21 U.S.C. section 360bbb-3(b)(1), unless the authorization is terminated or revoked.  Performed at [facility], [address], [place], [place] [address]   Lactic acid, plasma     Status: None   Collection Time: 04/08/22  4:55 PM  Result Value Ref Range   Lactic Acid, Venous 1.6 0.5 - 1.9 mmol/L    Comment: Performed at [facility], [address], [place], [place] [address]   Comprehensive metabolic panel     Status: Abnormal   Collection Time: 04/08/22  4:55 PM  Result Value Ref Range   Sodium 133 (L) 135 - 145 mmol/L   Potassium 4.7 3.5 - 5.1 mmol/L   Chloride 99 98 - 111 mmol/L   CO2 25 22 - 32 mmol/L   Glucose, Bld 194 (H) 70 - 99 mg/dL    Comment: Glucose reference range applies only to samples taken after fasting for at least 8 hours.   BUN 25 (H) 8 - 23 mg/dL   Creatinine, Ser 9.51 0.61 - 1.24 mg/dL   Calcium 9.1 8.9 - 88.4 mg/dL   Total Protein 5.9 (L) 6.5 - 8.1 g/dL   Albumin 2.9 (L) 3.5 - 5.0 g/dL   AST 10 (L) 15 - 41 U/L   ALT 32 0 - 44 U/L   Alkaline Phosphatase 51 38 - 126 U/L   Total Bilirubin 0.7 0.3 - 1.2 mg/dL   GFR, Estimated 59 (L) >60 mL/min    Comment: (NOTE) Calculated using the CKD-EPI Creatinine Equation (2021)    Anion gap 9 5 - 15    Comment: Performed at [facility], [address], [place], [place] [address]  CBC with Differential  Status: Abnormal   Collection Time: 04/08/22  4:55 PM  Result Value Ref Range   WBC 16.6 (H) 4.0 - 10.5 K/uL   RBC 4.29 4.22 - 5.81 MIL/uL   Hemoglobin 13.2 13.0 - 17.0 g/dL   HCT 39.7 39.0 - 52.0 %   MCV 92.5 80.0 - 100.0 fL   MCH 30.8 26.0 - 34.0 pg   MCHC 33.2 30.0 - 36.0 g/dL   RDW 14.0 11.5 - 15.5 %   Platelets 507 (H) 150 - 400 K/uL   nRBC 0.0 0.0 - 0.2 %   Neutrophils Relative % 92 %   Neutro Abs 15.3 (H) 1.7 - 7.7 K/uL   Lymphocytes Relative 5 %   Lymphs Abs 0.8 0.7 - 4.0 K/uL   Monocytes Relative 2 %   Monocytes Absolute 0.4 0.1 - 1.0 K/uL   Eosinophils Relative 0 %   Eosinophils Absolute 0.0 0.0 - 0.5 K/uL   Basophils Relative 0 %   Basophils Absolute 0.0 0.0 - 0.1 K/uL   Immature Granulocytes 1 %   Abs Immature Granulocytes 0.11 (H) 0.00 - 0.07 K/uL    Comment: Performed at [facility], [address] [place], [place] [address]  Protime-INR     Status: None   Collection Time: 04/08/22  4:55 PM  Result Value Ref Range   Prothrombin Time 14.2 11.4 -  15.2 seconds   INR 1.1 0.8 - 1.2    Comment: (NOTE) INR goal varies based on device and disease states. Performed at [facility], [place] [address] [place], [place] [address]   APTT     Status: None   Collection Time: 04/08/22  4:55 PM  Result Value Ref Range   aPTT 26 24 - 36 seconds    Comment: Performed at [facility] [address] [place], [place] [address]  Blood Culture (routine x 2)     Status: None (Preliminary result)   Collection Time: 04/08/22  4:55 PM   Specimen: BLOOD  Result Value Ref Range   Specimen Description BLOOD RIGHT ANTECUBITAL    Special Requests      BOTTLES DRAWN AEROBIC AND ANAEROBIC Blood Culture adequate volume   Culture      NO GROWTH < 24 HOURS Performed at [facility], [place] [address] [place], [place] [address]    Report Status PENDING   Blood Culture (routine x 2)     Status: None (Preliminary result)   Collection Time: 04/08/22  8:02 PM   Specimen: BLOOD  Result Value Ref Range   Specimen Description BLOOD SITE NOT SPECIFIED    Special Requests      BOTTLES DRAWN AEROBIC ONLY Blood Culture adequate volume   Culture      NO GROWTH < 24 HOURS Performed at [facility], [place] [address] [place], [place] [address]    Report Status PENDING   MRSA Next Gen by PCR, Nasal     Status: None   Collection Time: 04/08/22  8:19 PM   Specimen: Nasal Mucosa; Nasal Swab  Result Value Ref Range   MRSA by PCR Next Gen NOT DETECTED NOT DETECTED    Comment: (NOTE) The GeneXpert MRSA Assay (FDA approved for NASAL specimens only), is one component of a comprehensive MRSA colonization surveillance program. It is not intended to diagnose MRSA infection nor to guide or monitor treatment for MRSA infections. Test performance is not FDA approved in patients less than 50 years old. Performed  at [facility], [address] [place], [place] [address]   Urinalysis, Routine w reflex microscopic Urine, Catheterized     Status:  Abnormal   Collection Time: 04/08/22  9:50 PM  Result Value Ref Range   Color, Urine YELLOW YELLOW   APPearance CLEAR CLEAR   Specific Gravity, Urine 1.013 1.005 - 1.030   pH 6.0 5.0 - 8.0   Glucose, UA NEGATIVE NEGATIVE mg/dL   Hgb urine dipstick NEGATIVE NEGATIVE   Bilirubin Urine NEGATIVE NEGATIVE   Ketones, ur NEGATIVE NEGATIVE mg/dL   Protein, ur NEGATIVE NEGATIVE mg/dL   Nitrite NEGATIVE NEGATIVE   Leukocytes,Ua TRACE (A) NEGATIVE   RBC / HPF 0-5 0 - 5 RBC/hpf   WBC, UA 6-10 0 - 5 WBC/hpf   Bacteria, UA RARE (A) NONE SEEN   Squamous Epithelial / LPF 0-5 0 - 5   Mucus PRESENT    Hyaline Casts, UA PRESENT     Comment: Performed at [facility], [address], [place], [place] [address]  Comprehensive metabolic panel     Status: Abnormal   Collection Time: 04/09/22  3:13 AM  Result Value Ref Range   Sodium 135 135 - 145 mmol/L   Potassium 4.4 3.5 - 5.1 mmol/L   Chloride 101 98 - 111 mmol/L   CO2 25 22 - 32 mmol/L   Glucose, Bld 87 70 - 99 mg/dL    Comment: Glucose reference range applies only to samples taken after fasting for at least 8 hours.   BUN 15 8 - 23 mg/dL   Creatinine, Ser 1.59 0.61 - 1.24 mg/dL   Calcium 8.5 (L) 8.9 - 10.3 mg/dL   Total Protein 5.1 (L) 6.5 - 8.1 g/dL   Albumin 2.5 (L) 3.5 - 5.0 g/dL   AST 9 (L) 15 - 41 U/L   ALT 29 0 - 44 U/L   Alkaline Phosphatase 42 38 - 126 U/L   Total Bilirubin 0.7 0.3 - 1.2 mg/dL   GFR, Estimated >45 >85 mL/min    Comment: (NOTE) Calculated using the CKD-EPI Creatinine Equation (2021)    Anion gap 9 5 - 15    Comment: Performed at [facility], [address] [place], [place] [address]  CBC     Status: Abnormal   Collection Time: 04/09/22  3:13 AM  Result Value Ref Range   WBC 17.0 (H) 4.0 - 10.5 K/uL   RBC 3.66 (L) 4.22 - 5.81 MIL/uL   Hemoglobin 11.3 (L) 13.0 - 17.0 g/dL   HCT 46.2 (L) 86.3 - 81.7 %   MCV 92.3 80.0 - 100.0 fL   MCH 30.9 26.0 - 34.0 pg   MCHC 33.4 30.0 - 36.0 g/dL   RDW 71.1  65.7 - 90.3 %   Platelets 471 (H) 150 - 400 K/uL   nRBC 0.0 0.0 - 0.2 %    Comment: Performed at [facility], [address] [place], [place] [address]  Magnesium     Status: None   Collection Time: 04/09/22  3:13 AM  Result Value Ref Range   Magnesium 2.0 1.7 - 2.4 mg/dL    Comment: Performed at [facility], [address], [place], [place] [address]  Phosphorus     Status: None   Collection Time: 04/09/22  3:13 AM  Result Value Ref Range   Phosphorus 4.1 2.5 - 4.6 mg/dL    Comment: Performed at [facility], [address]  [place], [place] [id number]   CT Head Wo Contrast  Result Date: 04/08/2022 CLINICAL DATA:  Altered mental status, recent COVID EXAM: CT HEAD WITHOUT CONTRAST TECHNIQUE: Contiguous axial images were obtained from the base of the skull through the vertex without intravenous contrast. RADIATION DOSE REDUCTION: This exam was performed according to the departmental dose-optimization program which includes automated exposure control, adjustment of the mA and/or kV according to patient size and/or use of iterative reconstruction technique. COMPARISON:  03/27/2021 FINDINGS: Brain: No evidence of acute infarction, hemorrhage, hydrocephalus, extra-axial collection or mass lesion/mass effect. Global cortical atrophy. Subcortical white matter and periventricular small vessel ischemic changes. Vascular: Intracranial atherosclerosis. Skull: Normal. Negative for fracture or focal lesion. Sinuses/Orbits: The visualized paranasal sinuses are essentially clear. The mastoid air cells are unopacified. Other: None. IMPRESSION: No acute intracranial abnormality. Atrophy with small vessel ischemic changes. Electronically Signed   By: Charline BillsSriyesh  Krishnan M.D.   On: 04/08/2022 21:04   DG Chest Port 1 View  Result Date: 04/08/2022 CLINICAL DATA:  Questionable sepsis - evaluate for abnormality EXAM: PORTABLE CHEST 1 VIEW COMPARISON:  March 25, 2022 FINDINGS: The  cardiomediastinal silhouette is unchanged in contour.Atherosclerotic calcifications. Cardiac loop recorder. No pleural effusion. No pneumothorax. No acute pleuroparenchymal abnormality. Visualized abdomen is unremarkable. IMPRESSION: No acute cardiopulmonary abnormality. Electronically Signed   By: Meda KlinefelterStephanie  Peacock M.D.   On: 04/08/2022 17:32    Pending Labs Unresulted Labs (From admission, onward)     Start     Ordered   04/15/22 0500  Creatinine, serum  (enoxaparin (LOVENOX)    CrCl >/= 30 ml/min)  Weekly,   R     Comments: while on enoxaparin therapy    04/08/22 2319   04/10/22 0500  CBC with Differential/Platelet  Tomorrow morning,   R        04/09/22 0800   04/10/22 0500  Basic metabolic panel  Tomorrow morning,   R        04/09/22 0800   04/09/22 1600  CBC with Differential/Platelet  Once,   R        04/09/22 1421   04/09/22 1442  ABO/Rh  Once,   STAT        04/09/22 1442   04/09/22 1427  D-dimer, quantitative  Once,   R        04/09/22 1426   04/09/22 1422  Type and screen [facility]  Once,   R       Comments: [facility]    04/09/22 1421   04/08/22 1640  Urine Culture  (Septic presentation on arrival (screening labs, nursing and treatment orders for obvious sepsis))  ONCE - URGENT,   URGENT       Question:  Indication  Answer:  Dysuria   04/08/22 1640            Vitals/Pain Today's Vitals   04/09/22 1215 04/09/22 1300 04/09/22 1302 04/09/22 1430  BP: 121/78 96/65  120/78  Pulse: (!) 108 (!) 108  99  Resp: (!) 24 (!) 25  (!) 22  Temp:      TempSrc:      SpO2: (!) 89% 94%  94%  Weight:      Height:      PainSc:   0-No pain     Isolation Precautions No active isolations  Medications Medications  ceFEPIme (MAXIPIME) 2 g in sodium chloride 0.9 % 100 mL IVPB (0 g Intravenous Stopped 04/09/22 0636)  atorvastatin (LIPITOR) tablet 80 mg (80  mg Oral Given 04/09/22 0913)  albuterol (PROVENTIL) (2.5 MG/3ML) 0.083% nebulizer  solution 3 mL (has no administration in time range)  mometasone-formoterol (DULERA) 100-5 MCG/ACT inhaler 2 puff (2 puffs Inhalation Not Given 04/09/22 0917)  enoxaparin (LOVENOX) injection 40 mg (40 mg Subcutaneous Given 04/09/22 0917)  sodium chloride flush (NS) 0.9 % injection 3 mL (3 mLs Intravenous Not Given 04/09/22 0921)  acetaminophen (TYLENOL) tablet 650 mg (has no administration in time range)    Or  acetaminophen (TYLENOL) suppository 650 mg (has no administration in time range)  polyethylene glycol (MIRALAX / GLYCOLAX) packet 17 g (17 g Oral Given 04/09/22 0912)  ondansetron (ZOFRAN) tablet 4 mg (has no administration in time range)    Or  ondansetron (ZOFRAN) injection 4 mg (has no administration in time range)  metroNIDAZOLE (FLAGYL) IVPB 500 mg (0 mg Intravenous Stopped 04/09/22 1204)  losartan (COZAAR) tablet 25 mg (25 mg Oral Given 04/09/22 0913)  tamsulosin (FLOMAX) capsule 0.4 mg (0.4 mg Oral Given 04/09/22 0913)  pantoprazole (PROTONIX) injection 40 mg (40 mg Intravenous Given 04/09/22 1034)  lactated ringers infusion (has no administration in time range)  lactated ringers bolus 1,000 mL (0 mLs Intravenous Stopped 04/08/22 1946)    And  lactated ringers bolus 1,000 mL (0 mLs Intravenous Stopped 04/08/22 2125)    And  lactated ringers bolus 500 mL (0 mLs Intravenous Stopped 04/08/22 2218)  ceFEPIme (MAXIPIME) 2 g in sodium chloride 0.9 % 100 mL IVPB (0 g Intravenous Stopped 04/08/22 1741)  vancomycin (VANCOREADY) IVPB 1750 mg/350 mL (0 mg Intravenous Stopped 04/08/22 2320)  lidocaine (XYLOCAINE) 2 % jelly 1 Application (1 Application Topical Given 04/08/22 2147)  sodium chloride 0.9 % bolus 1,000 mL (1,000 mLs Intravenous New Bag/Given 04/09/22 1423)    Mobility non-ambulatory Low fall risk   Focused Assessments Renal Assessment Handoff:  Hemodialysis Schedule:  Last Hemodialysis date and time:    Restricted appendage:    , Pulmonary Assessment Handoff:  Lung  sounds:          R Recommendations: See Admitting Provider Note  Report given to:   Additional Notes:  Pt had a syncope episode on the bedside commode, he was trying to have a bowel movement and had a vagal response.

## 2022-04-10 ENCOUNTER — Observation Stay (HOSPITAL_BASED_OUTPATIENT_CLINIC_OR_DEPARTMENT_OTHER): Payer: Medicare Other | Admitting: Certified Registered Nurse Anesthetist

## 2022-04-10 ENCOUNTER — Observation Stay (HOSPITAL_COMMUNITY): Payer: Medicare Other | Admitting: Certified Registered Nurse Anesthetist

## 2022-04-10 ENCOUNTER — Encounter (HOSPITAL_COMMUNITY)
Admission: EM | Disposition: A | Discharge status: Skilled Nursing Facility | Payer: Self-pay | Source: Home / Self Care | Attending: Internal Medicine

## 2022-04-10 DIAGNOSIS — K922 Gastrointestinal hemorrhage, unspecified: Secondary | ICD-10-CM

## 2022-04-10 DIAGNOSIS — K2100 Gastro-esophageal reflux disease with esophagitis, without bleeding: Secondary | ICD-10-CM | POA: Diagnosis not present

## 2022-04-10 DIAGNOSIS — E861 Hypovolemia: Secondary | ICD-10-CM | POA: Diagnosis not present

## 2022-04-10 DIAGNOSIS — D649 Anemia, unspecified: Secondary | ICD-10-CM | POA: Diagnosis not present

## 2022-04-10 DIAGNOSIS — K209 Esophagitis, unspecified without bleeding: Secondary | ICD-10-CM | POA: Diagnosis not present

## 2022-04-10 DIAGNOSIS — K254 Chronic or unspecified gastric ulcer with hemorrhage: Secondary | ICD-10-CM

## 2022-04-10 DIAGNOSIS — J449 Chronic obstructive pulmonary disease, unspecified: Secondary | ICD-10-CM

## 2022-04-10 DIAGNOSIS — K250 Acute gastric ulcer with hemorrhage: Secondary | ICD-10-CM

## 2022-04-10 DIAGNOSIS — K259 Gastric ulcer, unspecified as acute or chronic, without hemorrhage or perforation: Secondary | ICD-10-CM

## 2022-04-10 DIAGNOSIS — F17210 Nicotine dependence, cigarettes, uncomplicated: Secondary | ICD-10-CM

## 2022-04-10 DIAGNOSIS — I9589 Other hypotension: Secondary | ICD-10-CM | POA: Diagnosis not present

## 2022-04-10 HISTORY — PX: ESOPHAGOGASTRODUODENOSCOPY (EGD) WITH PROPOFOL: SHX5813

## 2022-04-10 HISTORY — PX: HEMOSTASIS CONTROL: SHX6838

## 2022-04-10 HISTORY — PX: BIOPSY: SHX5522

## 2022-04-10 LAB — URINE CULTURE: Culture: NO GROWTH

## 2022-04-10 LAB — CBC WITH DIFFERENTIAL/PLATELET
Abs Immature Granulocytes: 0.12 10*3/uL — ABNORMAL HIGH (ref 0.00–0.07)
Basophils Absolute: 0 10*3/uL (ref 0.0–0.1)
Basophils Relative: 0 %
Eosinophils Absolute: 0.2 10*3/uL (ref 0.0–0.5)
Eosinophils Relative: 2 %
HCT: 31.7 % — ABNORMAL LOW (ref 39.0–52.0)
Hemoglobin: 10.8 g/dL — ABNORMAL LOW (ref 13.0–17.0)
Immature Granulocytes: 1 %
Lymphocytes Relative: 9 %
Lymphs Abs: 1.3 10*3/uL (ref 0.7–4.0)
MCH: 31 pg (ref 26.0–34.0)
MCHC: 34.1 g/dL (ref 30.0–36.0)
MCV: 91.1 fL (ref 80.0–100.0)
Monocytes Absolute: 0.9 10*3/uL (ref 0.1–1.0)
Monocytes Relative: 6 %
Neutro Abs: 12.2 10*3/uL — ABNORMAL HIGH (ref 1.7–7.7)
Neutrophils Relative %: 82 %
Platelets: 440 10*3/uL — ABNORMAL HIGH (ref 150–400)
RBC: 3.48 MIL/uL — ABNORMAL LOW (ref 4.22–5.81)
RDW: 14.2 % (ref 11.5–15.5)
WBC: 14.7 10*3/uL — ABNORMAL HIGH (ref 4.0–10.5)
nRBC: 0 % (ref 0.0–0.2)

## 2022-04-10 LAB — BASIC METABOLIC PANEL
Anion gap: 6 (ref 5–15)
BUN: 10 mg/dL (ref 8–23)
CO2: 24 mmol/L (ref 22–32)
Calcium: 8.1 mg/dL — ABNORMAL LOW (ref 8.9–10.3)
Chloride: 106 mmol/L (ref 98–111)
Creatinine, Ser: 0.83 mg/dL (ref 0.61–1.24)
GFR, Estimated: >60 mL/min (ref >60)
Glucose, Bld: 91 mg/dL (ref 70–99)
Potassium: 4 mmol/L (ref 3.5–5.1)
Sodium: 136 mmol/L (ref 135–145)

## 2022-04-10 SURGERY — ESOPHAGOGASTRODUODENOSCOPY (EGD) WITH PROPOFOL
Anesthesia: Monitor Anesthesia Care

## 2022-04-10 MED ORDER — LIDOCAINE 2% (20 MG/ML) 5 ML SYRINGE
INTRAMUSCULAR | Status: DC | PRN
  Administered 2022-04-10: 40 mg via INTRAVENOUS

## 2022-04-10 MED ORDER — PROPOFOL 500 MG/50ML IV EMUL
INTRAVENOUS | Status: DC | PRN
  Administered 2022-04-10: 125 ug/kg/min via INTRAVENOUS

## 2022-04-10 MED ORDER — PROPOFOL 10 MG/ML IV BOLUS
INTRAVENOUS | Status: DC | PRN
  Administered 2022-04-10 (×2): 10 mg via INTRAVENOUS

## 2022-04-10 MED ORDER — CHLORHEXIDINE GLUCONATE CLOTH 2 % EX PADS
6.0000 | MEDICATED_PAD | Freq: Every day | CUTANEOUS | Status: DC
  Administered 2022-04-10 – 2022-04-13 (×4): 6 via TOPICAL

## 2022-04-10 MED ORDER — PHENYLEPHRINE 80 MCG/ML (10ML) SYRINGE FOR IV PUSH (FOR BLOOD PRESSURE SUPPORT)
PREFILLED_SYRINGE | INTRAVENOUS | Status: DC | PRN
  Administered 2022-04-10 (×4): 160 ug via INTRAVENOUS
  Administered 2022-04-10: 80 ug via INTRAVENOUS

## 2022-04-10 MED ORDER — LACTATED RINGERS IV SOLN: INTRAVENOUS | Status: DC | PRN

## 2022-04-10 SURGICAL SUPPLY — 15 items

## 2022-04-10 NOTE — Op Note (Signed)
[facility] Patient Name: Adrian Rodgers Procedure Date : 04/10/2022 MRN: [id number] Attending MD: Carlota Raspberry. Havery Moros , MD Date of Birth: 03-13-1941 CSN: [id number] Age: 81 Admit Type: Outpatient Procedure:                Upper GI endoscopy Indications:              Melena / dark stools, anemia, history of Plavix use Providers:                Carlota Raspberry. Havery Moros, MD, Velva Harman, RN, Cherylynn Ridges, Technician, Sampson Si, CRNA, Reather Laurence, CRNA Referring MD:              Medicines:                Monitored Anesthesia Care Complications:            No immediate complications. Estimated blood loss:                            Minimal. Estimated Blood Loss:     Estimated blood loss was minimal. Procedure:                Pre-Anesthesia Assessment:                           - Prior to the procedure, a History and Physical                            was performed, and patient medications and                            allergies were reviewed. The patient's tolerance of                            previous anesthesia was also reviewed. The risks                            and benefits of the procedure and the sedation                            options and risks were discussed with the patient.                            All questions were answered, and informed consent                            was obtained. Prior Anticoagulants: The patient has                            taken Plavix (clopidogrel), last dose was 2 days  prior to procedure. ASA Grade Assessment: III - A                            patient with severe systemic disease. After                            reviewing the risks and benefits, the patient was                            deemed in satisfactory condition to undergo the                            procedure.                           After obtaining informed  consent, the endoscope was                            passed under direct vision. Throughout the                            procedure, the patient's blood pressure, pulse, and                            oxygen saturations were monitored continuously. The                            [id number]-H190 ([id number] Olympus endoscope was introduced                            through the mouth, and advanced to the second part                            of duodenum. The upper GI endoscopy was                            accomplished without difficulty. The patient                            tolerated the procedure well. Scope In: Scope Out: Findings:      Esophagogastric landmarks were identified: the Z-line was found at 40       cm, the gastroesophageal junction was found at 40 cm and the upper       extent of the gastric folds was found at 40 cm from the incisors.      LA Grade A esophagitis with no bleeding was found at the GEJ.      The exam of the esophagus was otherwise normal.      One non-bleeding deeply cratered gastric ulcer with an adherent clot at       the base (Forrest Class IIb) and stigmata of recent bleeding was found       at the incisura. The lesion was 12 mm in largest dimension. I lavaged       this extensively and clot could not be lavaged off. There was some mild  oozing from the base of it from lavage. This large cratered lesion was       not amenable to hemostasis clips, and focally could not identify a clear       spot to coagulate. In this light, I utilized and applied 3cc's of       Purastat to the base of the lesion with good result. No active bleeding       noted at the end of the exam.      One non-bleeding superficial gastric ulcer with a clean ulcer base       (Forrest Class III) was found in the gastric body. The lesion was 3 mm       in largest dimension.      One cratered gastric ulcer with a clean ulcer base (Forrest Class III)       was found in the prepyloric  region of the stomach. The lesion was 12 mm       in largest dimension.      The exam of the stomach was otherwise normal.      Biopsies were taken with a cold forceps in the gastric body and in the       gastric antrum for Helicobacter pylori testing.      Patchy mildly erythematous mucosa was found in the duodenal bulb.      The duodenal sweep was quite angulated. The exam of the duodenum was       otherwise normal. Impression:               - Esophagogastric landmarks identified.                           - LA Grade A reflux esophagitis with no bleeding.                           - Non-bleeding gastric ulcer with an adherent clot                            (Forrest Class IIb) and mild oozing. Likely source                            of bleeding symptoms. Purastat applied with good                            result.                           - Non-bleeding gastric ulcer with a clean ulcer                            base (Forrest Class III).                           - Gastric ulcer with a clean ulcer base (Forrest                            Class III).                           - Erythematous duodenopathy.                           -  Biopsies were taken with a cold forceps for                            Helicobacter pylori testing. Recommendation:           - Return patient to hospital ward for ongoing care.                           - Clear liquid diet okay later tonight if he is                            otherwise stable without further bleeding today.                           - Continue present medications.                           - Continue to hold Plavix                           - No NSAIDs                           - Continue protonix 40mg  twice daily                           - Trend Hgb                           - Await pathology results                           - GI service will continue to follow, call with                            questions Procedure Code(s):         --- Professional ---                           43255, 59, Esophagogastroduodenoscopy, flexible,                            transoral; with control of bleeding, any method                           43239, Esophagogastroduodenoscopy, flexible,                            transoral; with biopsy, single or multiple Diagnosis Code(s):        --- Professional ---                           K21.00, Gastro-esophageal reflux disease with                            esophagitis, without bleeding  K25.4, Chronic or unspecified gastric ulcer with                            hemorrhage                           K25.9, Gastric ulcer, unspecified as acute or                            chronic, without hemorrhage or perforation                           K31.89, Other diseases of stomach and duodenum                           K92.1, Melena (includes Hematochezia) CPT copyright 2019 [employer]. All rights reserved. The codes documented in this report are preliminary and upon coder review may  be revised to meet current compliance requirements. Remo Lipps P. Jarred Purtee, MD 04/10/2022 2:18:05 PM This report has been signed electronically. Number of Addenda: 0

## 2022-04-10 NOTE — Anesthesia Preprocedure Evaluation (Signed)
Anesthesia Evaluation  Patient identified by MRN, date of birth, ID band Patient awake    Reviewed: Allergy & Precautions, H&P , NPO status , Patient's Chart, lab work & pertinent test results  Airway Mallampati: II   Neck ROM: full    Dental   Pulmonary COPD, Current Smoker,    breath sounds clear to auscultation       Cardiovascular hypertension,  Rhythm:regular Rate:Normal     Neuro/Psych CVA    GI/Hepatic   Endo/Other    Renal/GU      Musculoskeletal   Abdominal   Peds  Hematology  (+) Blood dyscrasia, anemia ,   Anesthesia Other Findings   Reproductive/Obstetrics                             Anesthesia Physical Anesthesia Plan  ASA: 3  Anesthesia Plan: MAC   Post-op Pain Management:    Induction: Intravenous  PONV Risk Score and Plan: 0 and Propofol infusion and Treatment may vary due to age or medical condition  Airway Management Planned: Nasal Cannula  Additional Equipment:   Intra-op Plan:   Post-operative Plan:   Informed Consent: I have reviewed the patients History and Physical, chart, labs and discussed the procedure including the risks, benefits and alternatives for the proposed anesthesia with the patient or authorized representative who has indicated his/her understanding and acceptance.     Dental advisory given  Plan Discussed with: CRNA, Anesthesiologist and Surgeon  Anesthesia Plan Comments:         Anesthesia Quick Evaluation

## 2022-04-10 NOTE — Anesthesia Procedure Notes (Signed)
Procedure Name: MAC Date/Time: 04/10/2022 1:35 PM  Performed by: Janene Harvey, CRNAPre-anesthesia Checklist: Patient identified, Emergency Drugs available, Suction available and Patient being monitored Patient Re-evaluated:Patient Re-evaluated prior to induction Oxygen Delivery Method: Nasal cannula Induction Type: IV induction Placement Confirmation: positive ETCO2 Dental Injury: Teeth and Oropharynx as per pre-operative assessment

## 2022-04-10 NOTE — Progress Notes (Signed)
Daily Rounding Note  04/10/2022, 8:09 AM  LOS: 0 days   SUBJECTIVE:   Chief complaint:   FOBT + stool.  FTT after recent Covid 19.     2 dark stools overnight.   Pt denies n/v/abd pain.  Feels well.  No SOB.   Episodes of tachycardia as high at 110 yest afternoon, normal rates since 1900 yest.  No hypotension.  Sats near 100% on 2 L [place] oxygen.   2 D echo completed, not yet read.    OBJECTIVE:         Vital signs in last 24 hours:    Temp:  [98 F (36.7 C)-98.7 F (37.1 C)] 98 F (36.7 C) (10/11 0722) Pulse Rate:  [69-110] 69 (10/11 0722) Resp:  [17-28] 21 (10/11 0722) BP: (96-144)/(65-91) 144/78 (10/11 0722) SpO2:  [89 %-100 %] 99 % (10/11 0722) Weight:  [85.1 kg] 85.1 kg (10/11 0500) Last BM Date : 04/10/22 Filed Weights   04/08/22 1641 04/10/22 0500  Weight: 84.8 kg 85.1 kg   General: Looks extraordinarily better than he did this time yesterday.  Alert, comfortable.  Does not look ill and certainly looks less frail than he did yesterday. Heart: RRR. Chest: Clear bilaterally.  No labored breathing or cough Abdomen: Not distended or tender.  Soft.  Active bowel sounds Extremities: No CCE. Neuro/Psych: Pleasant.  Fully alert and oriented.  No obvious left-sided weakness on gripping strength or moving his foot from flex to extension.  Intake/Output from previous day: 10/10 0701 - 10/11 0700 In: 1102.3 [P.O.:220; I.V.:585.5; IV Piggyback:296.8] Out: 1600 [Urine:1600]  Intake/Output this shift: No intake/output data recorded.  Lab Results: Recent Labs    04/09/22 0313 04/09/22 1630 04/10/22 0250  WBC 17.0* 17.4* 14.7*  HGB 11.3* 12.0* 10.8*  HCT 33.8* 36.1* 31.7*  PLT 471* 483* 440*   BMET Recent Labs    04/08/22 1655 04/09/22 0313 04/10/22 0250  NA 133* 135 136  K 4.7 4.4 4.0  CL 99 101 106  CO2 25 25 24   GLUCOSE 194* 87 91  BUN 25* 15 10  CREATININE 1.23 0.80 0.83  CALCIUM 9.1 8.5* 8.1*    LFT Recent Labs    04/08/22 1655 04/09/22 0313  PROT 5.9* 5.1*  ALBUMIN 2.9* 2.5*  AST 10* 9*  ALT 32 29  ALKPHOS 51 42  BILITOT 0.7 0.7   PT/INR Recent Labs    04/08/22 1655  LABPROT 14.2  INR 1.1   Hepatitis Panel No results for input(s): "HEPBSAG", "HCVAB", "HEPAIGM", "HEPBIGM" in the last 72 hours.  Studies/Results: CT Angio Chest Pulmonary Embolism (PE) W or WO Contrast  Result Date: 04/09/2022 CLINICAL DATA:  Pulmonary embolism suspected, high probability syncopal episode with hypoxia. Recent COVID infection. EXAM: CT ANGIOGRAPHY CHEST WITH CONTRAST TECHNIQUE: Multidetector CT imaging of the chest was performed using the standard protocol during bolus administration of intravenous contrast. Multiplanar CT image reconstructions and MIPs were obtained to evaluate the vascular anatomy. RADIATION DOSE REDUCTION: This exam was performed according to the departmental dose-optimization program which includes automated exposure control, adjustment of the mA and/or kV according to patient size and/or use of iterative reconstruction technique. CONTRAST:  49mL OMNIPAQUE IOHEXOL 350 MG/ML SOLN COMPARISON:  Radiographs 04/08/2022.  CT 03/29/2022 and 09/28/2021. FINDINGS: Cardiovascular: The pulmonary arteries are well opacified with contrast to the level of the subsegmental branches. There is no evidence of acute pulmonary embolism. Mild atherosclerosis of the aorta, great vessels and coronary  arteries. Although systemic arterial enhancement is suboptimal, no acute abnormalities are identified. The heart size is normal. There is no pericardial effusion. Mediastinum/Nodes: There are no enlarged mediastinal, hilar or axillary lymph nodes.Unchanged small mediastinal lymph nodes. The thyroid gland, trachea and esophagus demonstrate no significant findings. Lungs/Pleura: No pleural effusion or pneumothorax. Moderate centrilobular and paraseptal emphysema with scattered atelectasis or scarring at  both lung bases. An ill-defined ground-glass in the left upper lobe measuring approximately 1.4 cm on image 26/2 is unchanged, without increasing solid components. No confluent airspace opacity or enlarging nodule identified. Upper abdomen: No acute or suspicious findings are seen within the visualized upper abdomen. There are small nonobstructing right renal calculi. There are grossly unchanged renal cysts bilaterally which do not require any specific imaging follow-up. Musculoskeletal/Chest wall: There is no chest wall mass or suspicious osseous finding. Review of the MIP images confirms the above findings. IMPRESSION: 1. No evidence of acute pulmonary embolism or other acute chest findings. 2. Stable ill-defined ground-glass opacity in the left upper lobe. 3. Nonobstructing right renal calculi. 4. Aortic Atherosclerosis (ICD10-I70.0) and Emphysema (ICD10-J43.9). Electronically Signed   By: Richardean Sale M.D.   On: 04/09/2022 17:54   ECHOCARDIOGRAM COMPLETE  Result Date: 04/09/2022    ECHOCARDIOGRAM REPORT   Patient Name:   Adrian Rodgers Date of Exam: 04/09/2022 Medical Rec #:  [id number]       Height:       68.0 in Accession #:    [id number]      Weight:       187.0 lb Date of Birth:  09/30/1940       BSA:          1.986 m Patient Age:    81 years        BP:           96/65 mmHg Patient Gender: M               HR:           98 bpm. Exam Location:  Inpatient Procedure: 2D Echo, Cardiac Doppler and Color Doppler Indications:    R55 Syncope  History:        Patient has prior history of Echocardiogram examinations, most                 recent 03/27/2021. Abnormal ECG, Stroke and COPD,                 Arrythmias:Atrial Fibrillation, Signs/Symptoms:Syncope and                 Altered Mental Status; Risk Factors:Dyslipidemia, Current Smoker                 and Hypertension.  Sonographer:    Roseanna Rainbow RDCS Referring Phys: [id number] [place]  1. Left ventricular ejection fraction, by estimation, is 70 to  75%. The left ventricle has hyperdynamic function. The left ventricle has no regional wall motion abnormalities. There is mild concentric left ventricular hypertrophy. Left ventricular diastolic parameters are consistent with Grade I diastolic dysfunction (impaired relaxation). Intracavitary gradient due to hyperdynamic function.  2. Right ventricular systolic function is hyperdynamic. The right ventricular size is normal. Tricuspid regurgitation signal is inadequate for assessing PA pressure.  3. The mitral valve is normal in structure. No evidence of mitral valve regurgitation. No evidence of mitral stenosis.  4. The aortic valve was not well visualized. Aortic valve regurgitation is not visualized. No aortic stenosis is present.  5.  Aortic dilatation noted. There is mild dilatation of the ascending aorta, measuring 42 mm.  6. The inferior vena cava is normal in size with greater than 50% respiratory variability, suggesting right atrial pressure of 3 mmHg. Comparison(s): No significant change from prior study. FINDINGS  Left Ventricle: Left ventricular ejection fraction, by estimation, is 70 to 75%. The left ventricle has hyperdynamic function. The left ventricle has no regional wall motion abnormalities. The left ventricular internal cavity size was small. There is mild concentric left ventricular hypertrophy. Left ventricular diastolic parameters are consistent with Grade I diastolic dysfunction (impaired relaxation). Right Ventricle: The right ventricular size is normal. No increase in right ventricular wall thickness. Right ventricular systolic function is hyperdynamic. Tricuspid regurgitation signal is inadequate for assessing PA pressure. Left Atrium: Left atrial size was normal in size. Right Atrium: Right atrial size was normal in size. Pericardium: There is no evidence of pericardial effusion. Mitral Valve: The mitral valve is normal in structure. No evidence of mitral valve regurgitation. No evidence of  mitral valve stenosis. Tricuspid Valve: The tricuspid valve is normal in structure. Tricuspid valve regurgitation is not demonstrated. No evidence of tricuspid stenosis. Aortic Valve: The aortic valve was not well visualized. Aortic valve regurgitation is not visualized. No aortic stenosis is present. Pulmonic Valve: The pulmonic valve was normal in structure. Pulmonic valve regurgitation is not visualized. No evidence of pulmonic stenosis. Aorta: Aortic dilatation noted. There is mild dilatation of the ascending aorta, measuring 42 mm. Venous: The inferior vena cava is normal in size with greater than 50% respiratory variability, suggesting right atrial pressure of 3 mmHg. IAS/Shunts: No atrial level shunt detected by color flow Doppler.  LEFT VENTRICLE PLAX 2D LVIDd:         3.10 cm     Diastology LVIDs:         1.80 cm     LV e' medial:    6.31 cm/s LV PW:         1.20 cm     LV E/e' medial:  7.3 LV IVS:        1.30 cm     LV e' lateral:   9.64 cm/s LVOT diam:     2.40 cm     LV E/e' lateral: 4.8 LV SV:         66 LV SV Index:   33 LVOT Area:     4.52 cm  LV Volumes (MOD) LV vol d, MOD A2C: 83.2 ml LV vol d, MOD A4C: 42.6 ml LV vol s, MOD A2C: 24.4 ml LV vol s, MOD A4C: 12.7 ml LV SV MOD A2C:     58.8 ml LV SV MOD A4C:     42.6 ml LV SV MOD BP:      43.0 ml RIGHT VENTRICLE             IVC RV S prime:     10.60 cm/s  IVC diam: 1.30 cm TAPSE (M-mode): 1.8 cm LEFT ATRIUM             Index        RIGHT ATRIUM           Index LA diam:        3.10 cm 1.56 cm/m   RA Area:     19.90 cm LA Vol (A2C):   37.6 ml 18.93 ml/m  RA Volume:   53.60 ml  26.99 ml/m LA Vol (A4C):   20.3 ml 10.22 ml/m LA Biplane Vol: 29.6  ml 14.90 ml/m  AORTIC VALVE LVOT Vmax:   98.40 cm/s LVOT Vmean:  56.000 cm/s LVOT VTI:    0.145 m  AORTA Ao Root diam: 3.10 cm Ao Asc diam:  4.20 cm MITRAL VALVE MV Area (PHT): 7.82 cm     SHUNTS MV Decel Time: 97 msec      Systemic VTI:  0.14 m MV E velocity: 45.90 cm/s   Systemic Diam: 2.40 cm MV A  velocity: 106.00 cm/s MV E/A ratio:  0.43 Rudean Haskell MD Electronically signed by Rudean Haskell MD Signature Date/Time: 04/09/2022/4:28:12 PM    Final    CT Head Wo Contrast  Result Date: 04/08/2022 CLINICAL DATA:  Altered mental status, recent COVID EXAM: CT HEAD WITHOUT CONTRAST TECHNIQUE: Contiguous axial images were obtained from the base of the skull through the vertex without intravenous contrast. RADIATION DOSE REDUCTION: This exam was performed according to the departmental dose-optimization program which includes automated exposure control, adjustment of the mA and/or kV according to patient size and/or use of iterative reconstruction technique. COMPARISON:  03/27/2021 FINDINGS: Brain: No evidence of acute infarction, hemorrhage, hydrocephalus, extra-axial collection or mass lesion/mass effect. Global cortical atrophy. Subcortical white matter and periventricular small vessel ischemic changes. Vascular: Intracranial atherosclerosis. Skull: Normal. Negative for fracture or focal lesion. Sinuses/Orbits: The visualized paranasal sinuses are essentially clear. The mastoid air cells are unopacified. Other: None. IMPRESSION: No acute intracranial abnormality. Atrophy with small vessel ischemic changes. Electronically Signed   By: Julian Hy M.D.   On: 04/08/2022 21:04   DG Chest Port 1 View  Result Date: 04/08/2022 CLINICAL DATA:  Questionable sepsis - evaluate for abnormality EXAM: PORTABLE CHEST 1 VIEW COMPARISON:  March 25, 2022 FINDINGS: The cardiomediastinal silhouette is unchanged in contour.Atherosclerotic calcifications. Cardiac loop recorder. No pleural effusion. No pneumothorax. No acute pleuroparenchymal abnormality. Visualized abdomen is unremarkable. IMPRESSION: No acute cardiopulmonary abnormality. Electronically Signed   By: Valentino Saxon M.D.   On: 04/08/2022 17:32    Scheduled Meds:  atorvastatin  80 mg Oral Daily   Chlorhexidine Gluconate Cloth  6 each  Topical Daily   enoxaparin (LOVENOX) injection  40 mg Subcutaneous Q24H   mometasone-formoterol  2 puff Inhalation BID   pantoprazole (PROTONIX) IV  40 mg Intravenous Q12H   sodium chloride flush  3 mL Intravenous Q12H   tamsulosin  0.4 mg Oral Daily   Continuous Infusions:  ceFEPime (MAXIPIME) IV 2 g (04/09/22 2125)   lactated ringers 75 mL/hr at 04/09/22 1832   metronidazole 500 mg (04/09/22 2218)   PRN Meds:.acetaminophen **OR** acetaminophen, albuterol, labetalol, ondansetron **OR** ondansetron (ZOFRAN) IV, polyethylene glycol   ASSESMENT:   Dark, FOBT positive stool. No PPI, H2B PTA.  Protonix 40 iv bid now in place.        Chronic Plavix.  Last dose 10/9.  On hold.      [place] anemia.  Hgb 13.2... 10.8 over 24 h.       Vasovagal syncope.  Trops normal.        Urinary retention.  Hematuria following (traumatic?) foley insertion.  No signif hematuria on initial UA   Anorexia, lethargy, FTT after recent admission for COVID-19 infection without pneumonia on x-ray then and today, treated with Paxlovid.  Background emphysema/COPD and ongoing tobacco abuse.  Empiric Flagyl, cefepime in place.   FTT, lethargy, anorexia since recent discharge.   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     Fatty liver per recent chest CT.  Other than low albumin, LFTs normal.  Hx CVA.  Worsening of residual L weakness.  Current CT negative for new CVA.       PLAN   EGD today.  Discussed with patient.  Patient recalls discussion about EGD from yesterday.  He is agreeable to proceed.    Azucena Freed  04/10/2022, 8:09 AM Phone [phone]

## 2022-04-10 NOTE — Progress Notes (Signed)
PROGRESS NOTE    Adrian Rodgers  LGX:[id number] DOB: 1941/04/07 DOA: 04/08/2022 PCP: Elsie Amis, MD     Brief Narrative:   CVA, hypertension, COPD, and admission last month for COVID with acute hypoxic respiratory failure, now presenting to the emergency department with loss of appetite, generalized weakness, mild confusion, and hypotension.  Subjective:  He is seen after returned from EGD He is stronger and fully alert /interactive, denies pain,  Urine is clear  He denies sob, no hypoxia at rest, SpO2 around low 90's Daughter at bedside   Assessment & Plan:  Principal Problem:   Hypotension due to hypovolemia Active Problems:   Acute respiratory failure due to COVID-19 (HCC)   Other emphysema (HCC)   History of CVA (cerebrovascular accident)   Essential hypertension   SIRS (systemic inflammatory response syndrome) (HCC)   Acute encephalopathy   Dark stools   Heme positive stool   Anemia   Antiplatelet or antithrombotic long-term use   Upper GI bleed   Acute gastric ulcer with hemorrhage  Hypotension/leukocytosis -Probably combination from dehydration, relatively adrenal insufficiency from recent steroid use -Hold Cozaar, continue hydration -Persistent leukocytosis, continue empiric antibiotic, blood culture no growth, urine culture in process, MRSA screening negative, COVID screening negative, CTA chest showed ill-defined groundglass opacity left lower lobe, nonobstructive right kidney stone, he does not appear to have respiratory symptoms, denies back pain, continue cefepime and Flagyl for now, continue hydration -Hypotension appear has resolved, WBC trending down  Acute urinary retention, 800 cc urine drained after Foley insertion  -Blood in urine  initially after foley placement, suspect Foley trauma, and initial UA did not show blood in urine -Urine is clear today,   -urine culture no growth -continue Flomax -Will need voiding trial prior to  discharge, outpatient urology follow up    GI bleed, Recent steroids use, also reports alcohol use S/p EGD on 10/11 with  Large gastric ulcers , treated. Hold plavix, continue ppi, diet advancement per GI  Appreciate GI input    Acute hypoxic respiratory failure, from atelectasis ? -RN report patient had hypoxia, 02 dropped to 89%, he was put on 3liter oxygen -cxr no acute findings, negative  tropoinin, CTA chest negative for  PE, showed " emphysema and stable ill-defined ground-glass opacity in the left upper lobe ", h/o recent h/o covid infection  -he denies respiratory symptoms, appear able to wean off oxygen -family reports patient has pulmonary follow-up in November.  H/o CVA with Has residual left-sided weakness.   Plavix held due to blood in stool,  Continue statin  Mood disorder P.o. bupropion  Tobacco use One pack a day, nicotine patch  Physical deconditioning PT eval, reports previously lives alone, but progressive weakness after covid infection     Addendum, R  I have Reviewed nursing notes, Vitals, pain scores, I/o's, Lab results and  imaging results since pt's last encounter, details please see discussion above  I ordered the following labs:  Unresulted Labs (From admission, onward)     Start     Ordered   04/15/22 0500  Creatinine, serum  (enoxaparin (LOVENOX)    CrCl >/= 30 ml/min)  Weekly,   R     Comments: while on enoxaparin therapy    04/08/22 2319   04/11/22 0500  CBC  Daily at 5am,   R      04/10/22 1524   04/11/22 0500  Basic metabolic panel  Daily at 5am,   R      04/10/22 1524  04/11/22 0500  Magnesium  Tomorrow morning,   R        04/10/22 1524             DVT prophylaxis: enoxaparin (LOVENOX) injection 40 mg Start: 04/09/22 1000   Code Status:   Code Status: Full Code  Family Communication: Family at bedside on 10/10 and 10/11 Disposition:    Dispo: The patient is from: home, ( recently discharged from SNF)               Anticipated d/c is to: TBD              Anticipated d/c date is: TBD > 24hrs, remain npo as large gastric ulcer need to heal, needs voiding trial, need Pt eval, needs wbc to improve, monitor hgb  Antimicrobials:   Anti-infectives (From admission, onward)    Start     Dose/Rate Route Frequency Ordered Stop   04/09/22 0900  vancomycin (VANCOREADY) IVPB 1250 mg/250 mL  Status:  Discontinued        1,250 mg 166.7 mL/hr over 90 Minutes Intravenous Every 24 hours 04/08/22 1803 04/09/22 1201   04/09/22 0600  ceFEPIme (MAXIPIME) 2 g in sodium chloride 0.9 % 100 mL IVPB        2 g 200 mL/hr over 30 Minutes Intravenous Every 12 hours 04/08/22 1803     04/08/22 2330  metroNIDAZOLE (FLAGYL) IVPB 500 mg        500 mg 100 mL/hr over 60 Minutes Intravenous Every 12 hours 04/08/22 2319 04/15/22 2159   04/08/22 1700  vancomycin (VANCOREADY) IVPB 1750 mg/350 mL        1,750 mg 175 mL/hr over 120 Minutes Intravenous  Once 04/08/22 1647 04/08/22 2320   04/08/22 1645  vancomycin (VANCOCIN) IVPB 1000 mg/200 mL premix  Status:  Discontinued        1,000 mg 200 mL/hr over 60 Minutes Intravenous  Once 04/08/22 1640 04/08/22 1647   04/08/22 1645  ceFEPIme (MAXIPIME) 2 g in sodium chloride 0.9 % 100 mL IVPB        2 g 200 mL/hr over 30 Minutes Intravenous  Once 04/08/22 1640 04/08/22 1741           Objective: Vitals:   04/10/22 1412 04/10/22 1420 04/10/22 1432 04/10/22 1615  BP: 103/73 122/83 (!) 148/87 (!) 139/98  Pulse: 76 82 72 89  Resp: (!) 31 14 18 18   Temp:    97.8 F (36.6 C)  TempSrc:    Oral  SpO2: 97% 100% 98% 99%  Weight:      Height:        Intake/Output Summary (Last 24 hours) at 04/10/2022 1827 Last data filed at 04/10/2022 1800 Gross per 24 hour  Intake 2159.56 ml  Output 2800 ml  Net -640.44 ml   Filed Weights   04/08/22 1641 04/10/22 0500  Weight: 84.8 kg 85.1 kg    Examination:  General exam: NAD, fully alert, interactive, oriented x3 Respiratory system: Clear to  auscultation. Respiratory effort normal. Cardiovascular system:  RRR.  Gastrointestinal system: Abdomen is nondistended, soft and nontender.  Normal bowel sounds heard. Central nervous system: orientedx3, right lower extremity weakness from prior stroke Extremities:  no edema Skin: No rashes, lesions or ulcers Psychiatry:  calm and cooperative    Data Reviewed: I have personally reviewed  labs and visualized  imaging studies since the last encounter and formulate the plan        Scheduled Meds:  atorvastatin  80  mg Oral Daily   Chlorhexidine Gluconate Cloth  6 each Topical Daily   enoxaparin (LOVENOX) injection  40 mg Subcutaneous Q24H   mometasone-formoterol  2 puff Inhalation BID   pantoprazole (PROTONIX) IV  40 mg Intravenous Q12H   sodium chloride flush  3 mL Intravenous Q12H   tamsulosin  0.4 mg Oral Daily   Continuous Infusions:  ceFEPime (MAXIPIME) IV 2 g (04/10/22 0958)   lactated ringers 75 mL/hr at 04/09/22 1832   metronidazole 500 mg (04/10/22 1721)     LOS: 0 days     Albertine Grates, MD PhD FACP [facility]  Available via Epic secure chat 7am-7pm for nonurgent issues Please page for urgent issues To page the attending provider between 7A-7P or the covering provider during after hours 7P-7A, please log into the web site [email or web address] and access using universal [place] password for that web site. If you do not have the password, please call the hospital operator.    04/10/2022, 6:27 PM

## 2022-04-10 NOTE — Progress Notes (Signed)
PT Cancellation Note  Patient Details Name: Blanche Gallien MRN: [id number] DOB: August 03, 1940   Cancelled Treatment:    Reason Eval/Treat Not Completed: Patient at procedure or test/unavailable. Pt currently off unit for endoscopy. Will check back as schedule allows to continue with PT POC.    Thelma Comp 04/10/2022, 11:03 AM  Rolinda Roan, PT, DPT Acute Rehabilitation Services Secure Chat Preferred Office: [phone]

## 2022-04-10 NOTE — Progress Notes (Signed)
Patient suffers from weakness which impairs their ability to perform daily activities like bathing, grooming, and toileting in the home.  A walker will not resolve  issue with performing activities of daily living. A wheelchair will allow patient to safely perform daily activities. Patient is not able to propel themselves in the home using a standard weight wheelchair due to general weakness. Patient can self propel in the lightweight wheelchair. Length of need Lifetime.  Accessories: elevating leg rests (ELRs), wheel locks, extensions and anti-tippers. 

## 2022-04-10 NOTE — Transfer of Care (Signed)
Immediate Anesthesia Transfer of Care Note  Patient: Adrian Rodgers  Procedure(s) Performed: ESOPHAGOGASTRODUODENOSCOPY (EGD) WITH PROPOFOL BIOPSY HEMOSTASIS CONTROL  Patient Location: Endoscopy Unit  Anesthesia Type:MAC  Level of Consciousness: drowsy  Airway & Oxygen Therapy: Patient Spontanous Breathing and Patient connected to nasal cannula oxygen  Post-op Assessment: Report given to RN and Post -op Vital signs reviewed and stable  Post vital signs: Reviewed and stable  Last Vitals:  Vitals Value Taken Time  BP 103/73 04/10/22 1412  Temp 36.1 C 04/10/22 1407  Pulse 76 04/10/22 1412  Resp 26 04/10/22 1412  SpO2 98 % 04/10/22 1412  Vitals shown include unvalidated device data.  Last Pain:  Vitals:   04/10/22 1407  TempSrc: Temporal  PainSc:          Complications: No notable events documented.

## 2022-04-10 NOTE — Care Management Obs Status (Signed)
[facility] NOTIFICATION   Patient Details  Name: Adrian Rodgers MRN: [id number] Date of Birth: 1941-02-04   Medicare Observation Status Notification Given:  Yes    Pollie Friar, RN 04/10/2022, 4:32 PM

## 2022-04-11 ENCOUNTER — Telehealth: Payer: Self-pay

## 2022-04-11 DIAGNOSIS — N99820 Postprocedural hemorrhage and hematoma of a genitourinary system organ or structure following a genitourinary system procedure: Secondary | ICD-10-CM | POA: Diagnosis not present

## 2022-04-11 DIAGNOSIS — R531 Weakness: Secondary | ICD-10-CM | POA: Diagnosis present

## 2022-04-11 DIAGNOSIS — F17210 Nicotine dependence, cigarettes, uncomplicated: Secondary | ICD-10-CM | POA: Diagnosis present

## 2022-04-11 DIAGNOSIS — E861 Hypovolemia: Secondary | ICD-10-CM | POA: Diagnosis present

## 2022-04-11 DIAGNOSIS — E7800 Pure hypercholesterolemia, unspecified: Secondary | ICD-10-CM | POA: Diagnosis present

## 2022-04-11 DIAGNOSIS — Z7902 Long term (current) use of antithrombotics/antiplatelets: Secondary | ICD-10-CM

## 2022-04-11 DIAGNOSIS — K922 Gastrointestinal hemorrhage, unspecified: Secondary | ICD-10-CM | POA: Diagnosis present

## 2022-04-11 DIAGNOSIS — I9589 Other hypotension: Secondary | ICD-10-CM | POA: Diagnosis present

## 2022-04-11 DIAGNOSIS — I10 Essential (primary) hypertension: Secondary | ICD-10-CM | POA: Diagnosis present

## 2022-04-11 DIAGNOSIS — E860 Dehydration: Secondary | ICD-10-CM | POA: Diagnosis present

## 2022-04-11 DIAGNOSIS — I69354 Hemiplegia and hemiparesis following cerebral infarction affecting left non-dominant side: Secondary | ICD-10-CM | POA: Diagnosis not present

## 2022-04-11 DIAGNOSIS — K250 Acute gastric ulcer with hemorrhage: Secondary | ICD-10-CM | POA: Diagnosis present

## 2022-04-11 DIAGNOSIS — Z20822 Contact with and (suspected) exposure to covid-19: Secondary | ICD-10-CM | POA: Diagnosis present

## 2022-04-11 DIAGNOSIS — K2100 Gastro-esophageal reflux disease with esophagitis, without bleeding: Secondary | ICD-10-CM | POA: Diagnosis present

## 2022-04-11 DIAGNOSIS — K3189 Other diseases of stomach and duodenum: Secondary | ICD-10-CM | POA: Diagnosis present

## 2022-04-11 DIAGNOSIS — D75839 Thrombocytosis, unspecified: Secondary | ICD-10-CM | POA: Diagnosis present

## 2022-04-11 DIAGNOSIS — G8929 Other chronic pain: Secondary | ICD-10-CM | POA: Diagnosis present

## 2022-04-11 DIAGNOSIS — R627 Adult failure to thrive: Secondary | ICD-10-CM | POA: Diagnosis present

## 2022-04-11 DIAGNOSIS — F32A Depression, unspecified: Secondary | ICD-10-CM | POA: Diagnosis present

## 2022-04-11 DIAGNOSIS — Y738 Miscellaneous gastroenterology and urology devices associated with adverse incidents, not elsewhere classified: Secondary | ICD-10-CM | POA: Diagnosis not present

## 2022-04-11 DIAGNOSIS — K760 Fatty (change of) liver, not elsewhere classified: Secondary | ICD-10-CM | POA: Diagnosis present

## 2022-04-11 DIAGNOSIS — I77810 Thoracic aortic ectasia: Secondary | ICD-10-CM | POA: Diagnosis present

## 2022-04-11 DIAGNOSIS — G9349 Other encephalopathy: Secondary | ICD-10-CM | POA: Diagnosis present

## 2022-04-11 DIAGNOSIS — R6510 Systemic inflammatory response syndrome (SIRS) of non-infectious origin without acute organ dysfunction: Secondary | ICD-10-CM | POA: Diagnosis present

## 2022-04-11 DIAGNOSIS — Y846 Urinary catheterization as the cause of abnormal reaction of the patient, or of later complication, without mention of misadventure at the time of the procedure: Secondary | ICD-10-CM | POA: Diagnosis not present

## 2022-04-11 DIAGNOSIS — E2740 Unspecified adrenocortical insufficiency: Secondary | ICD-10-CM | POA: Diagnosis present

## 2022-04-11 DIAGNOSIS — D62 Acute posthemorrhagic anemia: Secondary | ICD-10-CM | POA: Diagnosis present

## 2022-04-11 DIAGNOSIS — Z8616 Personal history of COVID-19: Secondary | ICD-10-CM | POA: Diagnosis not present

## 2022-04-11 DIAGNOSIS — J438 Other emphysema: Secondary | ICD-10-CM | POA: Diagnosis present

## 2022-04-11 LAB — CBC
HCT: 32 % — ABNORMAL LOW (ref 39.0–52.0)
Hemoglobin: 10.9 g/dL — ABNORMAL LOW (ref 13.0–17.0)
MCH: 31 pg (ref 26.0–34.0)
MCHC: 34.1 g/dL (ref 30.0–36.0)
MCV: 90.9 fL (ref 80.0–100.0)
Platelets: 446 10*3/uL — ABNORMAL HIGH (ref 150–400)
RBC: 3.52 MIL/uL — ABNORMAL LOW (ref 4.22–5.81)
RDW: 14.3 % (ref 11.5–15.5)
WBC: 8.2 10*3/uL (ref 4.0–10.5)
nRBC: 0 % (ref 0.0–0.2)

## 2022-04-11 LAB — BASIC METABOLIC PANEL
Anion gap: 12 (ref 5–15)
BUN: 7 mg/dL — ABNORMAL LOW (ref 8–23)
CO2: 24 mmol/L (ref 22–32)
Calcium: 8.6 mg/dL — ABNORMAL LOW (ref 8.9–10.3)
Chloride: 102 mmol/L (ref 98–111)
Creatinine, Ser: 0.86 mg/dL (ref 0.61–1.24)
GFR, Estimated: >60 mL/min (ref >60)
Glucose, Bld: 81 mg/dL (ref 70–99)
Potassium: 4 mmol/L (ref 3.5–5.1)
Sodium: 138 mmol/L (ref 135–145)

## 2022-04-11 LAB — MAGNESIUM: Magnesium: 1.9 mg/dL (ref 1.7–2.4)

## 2022-04-11 MED ORDER — SUCRALFATE 1 G PO TABS
1.0000 g | ORAL_TABLET | Freq: Four times a day (QID) | ORAL | Status: DC
  Administered 2022-04-11 – 2022-04-13 (×10): 1 g via ORAL
  Filled 2022-04-11 (×9): qty 1

## 2022-04-11 MED ORDER — LACTATED RINGERS IV SOLN: INTRAVENOUS | Status: DC

## 2022-04-11 MED ORDER — AMOXICILLIN-POT CLAVULANATE 875-125 MG PO TABS
1.0000 | ORAL_TABLET | Freq: Two times a day (BID) | ORAL | Status: AC
  Administered 2022-04-11 – 2022-04-12 (×4): 1 via ORAL
  Filled 2022-04-11 (×5): qty 1

## 2022-04-11 NOTE — Telephone Encounter (Signed)
Patient has been scheduled for a hospital follow up appt with Vicie Mutters, PA-C on Monday, 05/20/22 at 10 am.  Appt information will be on hospital discharge summary, I have also sent appt information via MyChart and mailed a copy.

## 2022-04-11 NOTE — Evaluation (Signed)
Physical Therapy Evaluation Patient Details Name: Adrian Rodgers MRN: [id number] DOB: 17-Feb-1941 Today's Date: 04/11/2022  History of Present Illness  81 yo male presents to [facility] hospital on 04/08/2022 with loss of apetite, weakness, confusion and hypotension. Pt recently admitted last month with acute hypoxic respiratory failure 2/2 COVID. PMH includes CVA, HTN, COVID.  Clinical Impression  Pt presents to PT with deficits in strength, power, balance, gait, endurance. Pt currently requries significant physical assistance to perform bed mobility and to maintain sitting balance during session. Pt perfmorms multiple transfers but demonstrates poor standing tolerance due to LE weakness. Pt will benefit from aggressive mobilization in an effort to improve strength and reduce falls risk. Currently, the pt's primary caregiver is his daughter, who is able to intermittently check on the patient. At this time the pt would need significant physical assistance to transfer and to safely mobilize to the edge of the bed. Pt expresses the preference to return home, however hired care would need to be obtained to provide enough support to aide in safe mobility at home when the pt's daughter is unavailable to assist. PT thus recommends SNF placement at this time.       Recommendations for follow up therapy are one component of a multi-disciplinary discharge planning process, led by the attending physician.  Recommendations may be updated based on patient status, additional functional criteria and insurance authorization.  Follow Up Recommendations Skilled nursing-short term rehab (<3 hours/day) (pt may decline if able to hire private care) Can patient physically be transported by private vehicle: No    Assistance Recommended at Discharge Intermittent Supervision/Assistance (assistance for all out of bed mobility)  Patient can return home with the following  A lot of help with bathing/dressing/bathroom;Two people to  help with walking and/or transfers;Assistance with cooking/housework;Assist for transportation;Help with stairs or ramp for entrance    Equipment Recommendations BSC/3in1  Recommendations for Other Services       Functional Status Assessment Patient has had a recent decline in their functional status and demonstrates the ability to make significant improvements in function in a reasonable and predictable amount of time.     Precautions / Restrictions Precautions Precautions: Fall Precaution Comments: prior CVA with L weakness, hypotension, O2 desat intermittently Restrictions Weight Bearing Restrictions: No      Mobility  Bed Mobility Overal bed mobility: Needs Assistance Bed Mobility: Supine to Sit, Sit to Supine     Supine to sit: Mod assist, HOB elevated Sit to supine: Max assist, +2 for physical assistance   General bed mobility comments: left lateral lean, dependent on rails, PT assist with use of pad to pivot hips. pt fatigued and requiring support of trunk and LE to return to supine later    Transfers Overall transfer level: Needs assistance Equipment used: Rolling walker (2 wheels) Transfers: Sit to/from Stand Sit to Stand: Min assist, From elevated surface (pt stands 4 times during session)           General transfer comment: cues for hand placement    Ambulation/Gait Ambulation/Gait assistance: Min assist Gait Distance (Feet): 1 Feet Assistive device: Rolling walker (2 wheels) Gait Pattern/deviations: Step-to pattern, Decreased dorsiflexion - left Gait velocity: reduced Gait velocity interpretation: <1.31 ft/sec, indicative of household ambulator   General Gait Details: pt takes 2 steps foeward, requires PT assist for weight shift to right side in order to clear left foot  Stairs            Wheelchair Mobility    Modified Rankin (  Stroke Patients Only)       Balance Overall balance assessment: Needs assistance Sitting-balance support: Feet  supported, Single extremity supported, Bilateral upper extremity supported Sitting balance-Leahy Scale: Poor Sitting balance - Comments: min-modA, max at times with fatigue, posterior and left lean Postural control: Posterior lean, Left lateral lean Standing balance support: Bilateral upper extremity supported, Reliant on assistive device for balance Standing balance-Leahy Scale: Poor Standing balance comment: pt slowly sinks back to bed durin final 2 stands due to LE weakness, more prominently noted in LLE                             Pertinent Vitals/Pain Pain Assessment Pain Assessment: No/denies pain    Home Living Family/patient expects to be discharged to:: Private residence Living Arrangements: Alone Available Help at Discharge: Family;Available PRN/intermittently Type of Home: House Home Access: Stairs to enter Entrance Stairs-Rails: Right Entrance Stairs-Number of Steps: 4   Home Layout: One level Home Equipment: Agricultural consultant (2 wheels);Rollator (4 wheels);Cane - single point;Grab bars - tub/shower;Hospital bed;Wheelchair - manual (hospital bed ordered, manual wheelchair delivered to room) Additional Comments: dtr comes by twice daily, reports she does not live very close    Prior Function Prior Level of Function : Independent/Modified Independent;Driving             Mobility Comments: independent, generally utilizing a cane prior to hospitalization in [place]       Hand Dominance   Dominant Hand: Right    Extremity/Trunk Assessment   Upper Extremity Assessment Upper Extremity Assessment: Generalized weakness    Lower Extremity Assessment Lower Extremity Assessment: Generalized weakness    Cervical / Trunk Assessment Cervical / Trunk Assessment: Kyphotic  Communication   Communication: No difficulties  Cognition Arousal/Alertness: Awake/alert Behavior During Therapy: Flat affect Overall Cognitive Status: Impaired/Different from  baseline Area of Impairment: Following commands, Safety/judgement, Awareness, Problem solving                       Following Commands: Follows one step commands with increased time, Follows multi-step commands inconsistently Safety/Judgement: Decreased awareness of safety, Decreased awareness of deficits Awareness: Emergent Problem Solving: Slow processing, Decreased initiation, Difficulty sequencing, Requires verbal cues, Requires tactile cues          General Comments General comments (skin integrity, edema, etc.): pt on RA upon arrival, sats fluctuating but in 90s when reading with good waveform. Pt hypotensive during session, BP 93/66 in sitting, 84/52 after return to supine, pt reports intermittent dizziness during session    Exercises     Assessment/Plan    PT Assessment Patient needs continued PT services  PT Problem List Decreased strength;Decreased activity tolerance;Decreased balance;Decreased mobility;Decreased cognition;Decreased knowledge of use of DME;Decreased safety awareness;Decreased knowledge of precautions;Cardiopulmonary status limiting activity       PT Treatment Interventions DME instruction;Gait training;Functional mobility training;Therapeutic activities;Stair training;Therapeutic exercise;Balance training;Neuromuscular re-education;Patient/family education;Cognitive remediation;Wheelchair mobility training    PT Goals (Current goals can be found in the Care Plan section)  Acute Rehab PT Goals Patient Stated Goal: to regain strength and return to independence PT Goal Formulation: With patient Time For Goal Achievement: 04/25/22 Potential to Achieve Goals: Fair    Frequency Min 3X/week (pt preference to return home with assistance)     Co-evaluation               AM-PAC PT "6 Clicks" Mobility  Outcome Measure Help needed turning from your back to your  side while in a flat bed without using bedrails?: A Lot Help needed moving from  lying on your back to sitting on the side of a flat bed without using bedrails?: A Lot Help needed moving to and from a bed to a chair (including a wheelchair)?: A Lot Help needed standing up from a chair using your arms (e.g., wheelchair or bedside chair)?: A Lot Help needed to walk in hospital room?: Total Help needed climbing 3-5 steps with a railing? : Total 6 Click Score: 10    End of Session   Activity Tolerance: Patient limited by fatigue Patient left: in bed;with call bell/phone within reach;with bed alarm set;with family/visitor present Nurse Communication: Mobility status PT Visit Diagnosis: Other abnormalities of gait and mobility (R26.89);Muscle weakness (generalized) (M62.81)    Time: 3016-0109 PT Time Calculation (min) (ACUTE ONLY): 33 min   Charges:   PT Evaluation $PT Eval Moderate Complexity: 1 Mod          Arlyss Gandy, PT, DPT Acute Rehabilitation Office [phone]   Arlyss Gandy 04/11/2022, 4:09 PM

## 2022-04-11 NOTE — Progress Notes (Addendum)
Progress Note   Subjective  Patient doing well, he has not had any bleeding symptoms overnight.  Hemoglobin stable, BUN downtrending.  He tolerated clear liquid diet overnight.  He is not in any pain.  Son and daughter-in-law at bedside.   Objective   Vital signs in last 24 hours: Temp:  [97 F (36.1 C)-98.9 F (37.2 C)] 98.9 F (37.2 C) (10/12 0738) Pulse Rate:  [72-89] 83 (10/12 0738) Resp:  [14-31] 23 (10/12 0738) BP: (81-148)/(54-98) 147/95 (10/12 0738) SpO2:  [93 %-100 %] 99 % (10/12 0738) Weight:  [79.4 kg] 79.4 kg (10/12 0500) Last BM Date : 04/10/22 General:    AA male in NAD Neurologic:  Alert and oriented,  grossly normal neurologically. Psych:  Cooperative. Normal mood and affect.  Intake/Output from previous day: 10/11 0701 - 10/12 0700 In: 1157.3 [P.O.:60; I.V.:833.5; IV Piggyback:263.8] Out: 3000 [Urine:3000] Intake/Output this shift: Total I/O In: -  Out: 400 [Urine:400]  Lab Results: Recent Labs    04/09/22 1630 04/10/22 0250 04/11/22 0332  WBC 17.4* 14.7* 8.2  HGB 12.0* 10.8* 10.9*  HCT 36.1* 31.7* 32.0*  PLT 483* 440* 446*   BMET Recent Labs    04/09/22 0313 04/10/22 0250 04/11/22 0332  NA 135 136 138  K 4.4 4.0 4.0  CL 101 106 102  CO2 25 24 24   GLUCOSE 87 91 81  BUN 15 10 7*  CREATININE 0.80 0.83 0.86  CALCIUM 8.5* 8.1* 8.6*   LFT Recent Labs    04/09/22 0313  PROT 5.1*  ALBUMIN 2.5*  AST 9*  ALT 29  ALKPHOS 42  BILITOT 0.7   PT/INR Recent Labs    04/08/22 1655  LABPROT 14.2  INR 1.1    Studies/Results: CT Angio Chest Pulmonary Embolism (PE) W or WO Contrast  Result Date: 04/09/2022 CLINICAL DATA:  Pulmonary embolism suspected, high probability syncopal episode with hypoxia. Recent COVID infection. EXAM: CT ANGIOGRAPHY CHEST WITH CONTRAST TECHNIQUE: Multidetector CT imaging of the chest was performed using the standard protocol during bolus administration of intravenous contrast. Multiplanar CT image  reconstructions and MIPs were obtained to evaluate the vascular anatomy. RADIATION DOSE REDUCTION: This exam was performed according to the departmental dose-optimization program which includes automated exposure control, adjustment of the mA and/or kV according to patient size and/or use of iterative reconstruction technique. CONTRAST:  35mL OMNIPAQUE IOHEXOL 350 MG/ML SOLN COMPARISON:  Radiographs 04/08/2022.  CT 03/29/2022 and 09/28/2021. FINDINGS: Cardiovascular: The pulmonary arteries are well opacified with contrast to the level of the subsegmental branches. There is no evidence of acute pulmonary embolism. Mild atherosclerosis of the aorta, great vessels and coronary arteries. Although systemic arterial enhancement is suboptimal, no acute abnormalities are identified. The heart size is normal. There is no pericardial effusion. Mediastinum/Nodes: There are no enlarged mediastinal, hilar or axillary lymph nodes.Unchanged small mediastinal lymph nodes. The thyroid gland, trachea and esophagus demonstrate no significant findings. Lungs/Pleura: No pleural effusion or pneumothorax. Moderate centrilobular and paraseptal emphysema with scattered atelectasis or scarring at both lung bases. An ill-defined ground-glass in the left upper lobe measuring approximately 1.4 cm on image 26/2 is unchanged, without increasing solid components. No confluent airspace opacity or enlarging nodule identified. Upper abdomen: No acute or suspicious findings are seen within the visualized upper abdomen. There are small nonobstructing right renal calculi. There are grossly unchanged renal cysts bilaterally which do not require any specific imaging follow-up. Musculoskeletal/Chest wall: There is no chest wall mass or suspicious osseous finding. Review  of the MIP images confirms the above findings. IMPRESSION: 1. No evidence of acute pulmonary embolism or other acute chest findings. 2. Stable ill-defined ground-glass opacity in the left  upper lobe. 3. Nonobstructing right renal calculi. 4. Aortic Atherosclerosis (ICD10-I70.0) and Emphysema (ICD10-J43.9). Electronically Signed   By: Richardean Sale M.D.   On: 04/09/2022 17:54   ECHOCARDIOGRAM COMPLETE  Result Date: 04/09/2022    ECHOCARDIOGRAM REPORT   Patient Name:   JUWAUN CORRADINO Date of Exam: 04/09/2022 Medical Rec #:  [id number]       Height:       68.0 in Accession #:    [id number]      Weight:       187.0 lb Date of Birth:  [birth year]       BSA:          1.986 m Patient Age:    81 years        BP:           96/65 mmHg Patient Gender: M               HR:           98 bpm. Exam Location:  Inpatient Procedure: 2D Echo, Cardiac Doppler and Color Doppler Indications:    R55 Syncope  History:        Patient has prior history of Echocardiogram examinations, most                 recent 03/27/2021. Abnormal ECG, Stroke and COPD,                 Arrythmias:Atrial Fibrillation, Signs/Symptoms:Syncope and                 Altered Mental Status; Risk Factors:Dyslipidemia, Current Smoker                 and Hypertension.  Sonographer:    Roseanna Rainbow RDCS Referring Phys: [id number] [place]  1. Left ventricular ejection fraction, by estimation, is 70 to 75%. The left ventricle has hyperdynamic function. The left ventricle has no regional wall motion abnormalities. There is mild concentric left ventricular hypertrophy. Left ventricular diastolic parameters are consistent with Grade I diastolic dysfunction (impaired relaxation). Intracavitary gradient due to hyperdynamic function.  2. Right ventricular systolic function is hyperdynamic. The right ventricular size is normal. Tricuspid regurgitation signal is inadequate for assessing PA pressure.  3. The mitral valve is normal in structure. No evidence of mitral valve regurgitation. No evidence of mitral stenosis.  4. The aortic valve was not well visualized. Aortic valve regurgitation is not visualized. No aortic stenosis is present.  5. Aortic  dilatation noted. There is mild dilatation of the ascending aorta, measuring 42 mm.  6. The inferior vena cava is normal in size with greater than 50% respiratory variability, suggesting right atrial pressure of 3 mmHg. Comparison(s): No significant change from prior study. FINDINGS  Left Ventricle: Left ventricular ejection fraction, by estimation, is 70 to 75%. The left ventricle has hyperdynamic function. The left ventricle has no regional wall motion abnormalities. The left ventricular internal cavity size was small. There is mild concentric left ventricular hypertrophy. Left ventricular diastolic parameters are consistent with Grade I diastolic dysfunction (impaired relaxation). Right Ventricle: The right ventricular size is normal. No increase in right ventricular wall thickness. Right ventricular systolic function is hyperdynamic. Tricuspid regurgitation signal is inadequate for assessing PA pressure. Left Atrium: Left atrial size was normal in size. Right Atrium: Right atrial size  was normal in size. Pericardium: There is no evidence of pericardial effusion. Mitral Valve: The mitral valve is normal in structure. No evidence of mitral valve regurgitation. No evidence of mitral valve stenosis. Tricuspid Valve: The tricuspid valve is normal in structure. Tricuspid valve regurgitation is not demonstrated. No evidence of tricuspid stenosis. Aortic Valve: The aortic valve was not well visualized. Aortic valve regurgitation is not visualized. No aortic stenosis is present. Pulmonic Valve: The pulmonic valve was normal in structure. Pulmonic valve regurgitation is not visualized. No evidence of pulmonic stenosis. Aorta: Aortic dilatation noted. There is mild dilatation of the ascending aorta, measuring 42 mm. Venous: The inferior vena cava is normal in size with greater than 50% respiratory variability, suggesting right atrial pressure of 3 mmHg. IAS/Shunts: No atrial level shunt detected by color flow Doppler.   LEFT VENTRICLE PLAX 2D LVIDd:         3.10 cm     Diastology LVIDs:         1.80 cm     LV e' medial:    6.31 cm/s LV PW:         1.20 cm     LV E/e' medial:  7.3 LV IVS:        1.30 cm     LV e' lateral:   9.64 cm/s LVOT diam:     2.40 cm     LV E/e' lateral: 4.8 LV SV:         66 LV SV Index:   33 LVOT Area:     4.52 cm  LV Volumes (MOD) LV vol d, MOD A2C: 83.2 ml LV vol d, MOD A4C: 42.6 ml LV vol s, MOD A2C: 24.4 ml LV vol s, MOD A4C: 12.7 ml LV SV MOD A2C:     58.8 ml LV SV MOD A4C:     42.6 ml LV SV MOD BP:      43.0 ml RIGHT VENTRICLE             IVC RV S prime:     10.60 cm/s  IVC diam: 1.30 cm TAPSE (M-mode): 1.8 cm LEFT ATRIUM             Index        RIGHT ATRIUM           Index LA diam:        3.10 cm 1.56 cm/m   RA Area:     19.90 cm LA Vol (A2C):   37.6 ml 18.93 ml/m  RA Volume:   53.60 ml  26.99 ml/m LA Vol (A4C):   20.3 ml 10.22 ml/m LA Biplane Vol: 29.6 ml 14.90 ml/m  AORTIC VALVE LVOT Vmax:   98.40 cm/s LVOT Vmean:  56.000 cm/s LVOT VTI:    0.145 m  AORTA Ao Root diam: 3.10 cm Ao Asc diam:  4.20 cm MITRAL VALVE MV Area (PHT): 7.82 cm     SHUNTS MV Decel Time: 97 msec      Systemic VTI:  0.14 m MV E velocity: 45.90 cm/s   Systemic Diam: 2.40 cm MV A velocity: 106.00 cm/s MV E/A ratio:  0.43 Rudean Haskell MD Electronically signed by Rudean Haskell MD Signature Date/Time: 04/09/2022/4:28:12 PM    Final        Assessment / Plan:    81 year old male in the hospital with the following:  Upper GI bleed Gastric ulcers Antiplatelet use Leukocytosis, possible pneumonia, on antibiotics  3 ulcers noted in his stomach yesterday on  EGD, 1 of which with stigmata for bleeding.  Treated endoscopically with Purastat with a good response.  He has had no recurrent bleeding symptoms, tolerating liquid diet, labs stable.  Discussed his course with the patient's son and his daughter-in-law today.  I would keep him in the hospital today on IV PPI and additional monitoring given high risk  to monitor for bleeding, but hopefully if he continues to do well today can discharge home later tomorrow.  Please continue Protonix 40 mg twice daily, when he is discharged can be transition to oral Protonix 40 mg twice daily.  We can also add Carafate tablets every 6 hours for the next few days to help with some discomfort he has when he eats.  I would hold Plavix for another 2 days or so if okay with primary service, perhaps resume sometime over the weekend if he is otherwise stable.  Biopsies were taken from the stomach to rule out H. pylori, will review that with him when it returns.  I can follow him up in the office for reassessment following his discharge.  If he is willing and otherwise stable, would plan for repeat EGD in 3 to 4 months to confirm mucosal healing of the ulcers.  He understands he should avoid all NSAIDs moving forward.  Plan: - soft diet okay today. Can advance to regular diet tomorrow - trend Hgb, monitor for bleeding - avoid NSAIDs - continue protonix 40mg  BID IV for now while in hospital, transition to oral protonix 40mg  BID at time of discharge until follow up with [place] - add oral carafate 1 tab every 6 hours for a few days (of note this may make stools dark) - hold Plavix for another 2 days, resume sometime over the weekend if stable - await pathology results - if he is otherwise stable then okay to discharge home from [facility] later tomorrow - repeat EGD in 3-4 months for mucosal healing. Our office will coordinate follow up post discharge  Call with questions, will sign off for now.  Jolly Mango, MD [facility]

## 2022-04-11 NOTE — NC FL2 (Signed)
[place] MEDICAID FL2 LEVEL OF CARE SCREENING TOOL     IDENTIFICATION  Patient Name: Adrian Rodgers Birthdate: [birth year] Sex: male Admission Date (Current Location): 04/08/2022  [facility] and [place] Number:  Producer, television/film/video and Address:  [facility] [place], [address] [place], [place] [address]      Provider Number: [id number]  Attending Physician Name and Address:  Albertine Grates, MD  Relative Name and Phone Number:       Current Level of Care: Hospital Recommended Level of Care: Skilled Nursing Facility Prior Approval Number:    Date Approved/Denied:   PASRR Number: [id number]  Discharge Plan: SNF    Current Diagnoses: Patient Active Problem List   Diagnosis Date Noted   GI bleed 04/11/2022   Upper GI bleed 04/10/2022   Acute gastric ulcer with hemorrhage 04/10/2022   Dark stools 04/09/2022   Heme positive stool 04/09/2022   Anemia 04/09/2022   Antiplatelet or antithrombotic long-term use 04/09/2022   Hypotension due to hypovolemia 04/08/2022   SIRS (systemic inflammatory response syndrome) (HCC) 04/08/2022   Acute encephalopathy 04/08/2022   Tobacco abuse 03/27/2022   Acute respiratory failure due to COVID-19 (HCC) 03/25/2022   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   Generalized weakness 03/27/2021   Ascending aortic aneurysm (HCC) 09/22/2020   Other emphysema (HCC) 05/18/2020   Essential hypertension 05/13/2020   HLD (hyperlipidemia) 05/13/2020   History of CVA (cerebrovascular accident) 05/12/2020    Orientation RESPIRATION BLADDER Height & Weight     Self, Time, Situation, Place  Normal Continent Weight: 175 lb 0.7 oz (79.4 kg) Height:  5\' 8"  (172.7 cm)  BEHAVIORAL SYMPTOMS/MOOD NEUROLOGICAL BOWEL NUTRITION STATUS      Continent Diet (see DC summary)  AMBULATORY STATUS COMMUNICATION OF NEEDS Skin   Limited Assist Verbally Normal                       Personal Care Assistance Level of Assistance  Bathing, Feeding, Dressing  Bathing Assistance: Limited assistance Feeding assistance: Limited assistance Dressing Assistance: Limited assistance     Functional Limitations Info  Sight, Hearing Sight Info: Impaired Hearing Info: Impaired      SPECIAL CARE FACTORS FREQUENCY  PT (By licensed PT), OT (By licensed OT)     PT Frequency: 5x/wk OT Frequency: 5x/wk            Contractures Contractures Info: Not present    Additional Factors Info  Code Status, Allergies Code Status Info: Full Allergies Info: NKA           Current Medications (04/11/2022):  This is the current hospital active medication list Current Facility-Administered Medications  Medication Dose Route Frequency Provider Last Rate Last Admin   acetaminophen (TYLENOL) tablet 650 mg  650 mg Oral Q6H PRN Opyd, 06/11/2022, MD   650 mg at 04/11/22 06/11/22   Or   acetaminophen (TYLENOL) suppository 650 mg  650 mg Rectal Q6H PRN Opyd, 8182, MD       albuterol (PROVENTIL) (2.5 MG/3ML) 0.083% nebulizer solution 3 mL  3 mL Inhalation Q6H PRN Opyd, Lavone Neri, MD       amoxicillin-clavulanate (AUGMENTIN) 875-125 MG per tablet 1 tablet  1 tablet Oral Q12H Lavone Neri, MD       atorvastatin (LIPITOR) tablet 80 mg  80 mg Oral Daily Opyd, Albertine Grates, MD   80 mg at 04/11/22 0903   Chlorhexidine Gluconate Cloth 2 % PADS 6 each  6 each Topical Daily  Florencia Reasons, MD   6 each at 04/11/22 0904   enoxaparin (LOVENOX) injection 40 mg  40 mg Subcutaneous Q24H Opyd, Ilene Qua, MD   40 mg at 04/11/22 0904   labetalol (NORMODYNE) injection 5 mg  5 mg Intravenous Q2H PRN Florencia Reasons, MD       lactated ringers infusion   Intravenous Continuous Florencia Reasons, MD       mometasone-formoterol (DULERA) 100-5 MCG/ACT inhaler 2 puff  2 puff Inhalation BID Opyd, Ilene Qua, MD   2 puff at 04/11/22 0905   ondansetron (ZOFRAN) tablet 4 mg  4 mg Oral Q6H PRN Opyd, Ilene Qua, MD       Or   ondansetron (ZOFRAN) injection 4 mg  4 mg Intravenous Q6H PRN Opyd, Ilene Qua, MD       pantoprazole  (PROTONIX) injection 40 mg  40 mg Intravenous Q12H Florencia Reasons, MD   40 mg at 04/11/22 0904   polyethylene glycol (MIRALAX / GLYCOLAX) packet 17 g  17 g Oral Daily PRN Opyd, Ilene Qua, MD   17 g at 04/09/22 0912   sodium chloride flush (NS) 0.9 % injection 3 mL  3 mL Intravenous Q12H Opyd, Ilene Qua, MD   3 mL at 04/11/22 0905   sucralfate (CARAFATE) tablet 1 g  1 g Oral Q6H Armbruster, Carlota Raspberry, MD   1 g at 04/11/22 1332   tamsulosin (FLOMAX) capsule 0.4 mg  0.4 mg Oral Daily Florencia Reasons, MD   0.4 mg at 04/11/22 5638     Discharge Medications: Please see discharge summary for a list of discharge medications.  Relevant Imaging Results:  Relevant Lab Results:   Additional Information SS#: [id number]  Geralynn Ochs, LCSW

## 2022-04-11 NOTE — Anesthesia Postprocedure Evaluation (Signed)
Anesthesia Post Note  Patient: Ki Corbo  Procedure(s) Performed: ESOPHAGOGASTRODUODENOSCOPY (EGD) WITH PROPOFOL BIOPSY HEMOSTASIS CONTROL     Patient location during evaluation: Endoscopy Anesthesia Type: MAC Level of consciousness: awake and alert Pain management: pain level controlled Vital Signs Assessment: post-procedure vital signs reviewed and stable Respiratory status: spontaneous breathing, nonlabored ventilation, respiratory function stable and patient connected to nasal cannula oxygen Cardiovascular status: stable and blood pressure returned to baseline Postop Assessment: no apparent nausea or vomiting Anesthetic complications: no   No notable events documented.  Last Vitals:  Vitals:   04/10/22 2347 04/11/22 0347  BP: (!) 132/90 128/86  Pulse: 83 85  Resp: (!) 21 (!) 22  Temp: 36.7 C 36.9 C  SpO2:  94%    Last Pain:  Vitals:   04/11/22 0347  TempSrc: Oral  PainSc:                  [place] S

## 2022-04-11 NOTE — Telephone Encounter (Signed)
-----   Message from Yetta Flock, MD sent at 04/11/2022 11:14 AM EDT ----- Regarding: outpatient follow up [facility] this patient will need a follow-up visit with me or APP in the next 2 months or so if you can help schedule.  Thanks

## 2022-04-11 NOTE — Progress Notes (Signed)
PROGRESS NOTE    Adrian Rodgers  XNA:[id number] DOB: [birth year] DOA: 04/08/2022 PCP: Cena Benton, MD     Brief Narrative:   CVA, hypertension, COPD, and admission last month for COVID with acute hypoxic respiratory failure, now presenting to the emergency department with loss of appetite, generalized weakness, mild confusion ( family denies), and hypotension.  Subjective:  No acute event reported overnight, no BM reported  Urine is clear  He denies sob, no hypoxia at rest, no chest pain, no cough, SpO2 around low 90's Family at bedside   Assessment & Plan:  Principal Problem:   Hypotension due to hypovolemia Active Problems:   Acute respiratory failure due to COVID-19 (HCC)   Other emphysema (HCC)   History of CVA (cerebrovascular accident)   Essential hypertension   SIRS (systemic inflammatory response syndrome) (HCC)   Acute encephalopathy   Dark stools   Heme positive stool   Anemia   Antiplatelet or antithrombotic long-term use   Upper GI bleed   Acute gastric ulcer with hemorrhage  Hypotension/leukocytosis -Probably combination from dehydration, relatively adrenal insufficiency from recent steroid use - blood culture no growth, urine culture no growth, MRSA screening negative, COVID screening negative, CTA chest showed ill-defined groundglass opacity left lower lobe, nonobstructive right kidney stone, he does not appear to have respiratory symptoms, denies back pain,  -he is treated with vanc on admission, then cefepime and Flagyl  x4 days, wbc normalized, change to augmentin to finish total 5 days abx treatment. continue hydration, encourage oral intake, continue hold Cozaar, improving    Acute urinary retention, 800 cc urine drained after Foley insertion  -Blood in urine  initially after foley placement, suspect Foley trauma, and initial UA did not show blood in urine -Urine is clear today,   -urine culture no growth -continue Flomax -Will need  voiding trial prior to discharge, outpatient urology follow up    GI bleed, Recent steroids use, also reports alcohol use -S/p EGD on 10/11 with  Large gastric ulcers , treated, detail please see GI note. -Hold plavix, continue ppi, diet advancement per GI  -monitor hgb, Appreciate GI input    Acute hypoxic respiratory failure, from atelectasis ? -RN report patient had hypoxia, 02 dropped to 89%, he was put on 3liter oxygen -cxr no acute findings, negative  tropoinin, CTA chest negative for  PE, showed " emphysema and stable ill-defined ground-glass opacity in the left upper lobe ", h/o recent h/o covid infection  -he denies respiratory symptoms, appear able to wean off oxygen -family reports patient has pulmonary follow-up in November.  H/o CVA with Has residual left-sided weakness.   Plavix held due to blood in stool, resume in 48hrs if no more bleeding  Continue statin  Mood disorder P.o. bupropion  Tobacco use One pack a day, nicotine patch  Physical deconditioning PT eval, reports previously lives alone, but progressive weakness after covid infection  HH and home DME ordered      I have Reviewed nursing notes, Vitals, pain scores, I/o's, Lab results and  imaging results since pt's last encounter, details please see discussion above  I ordered the following labs:  Unresulted Labs (From admission, onward)     Start     Ordered   04/15/22 0500  Creatinine, serum  (enoxaparin (LOVENOX)    CrCl >/= 30 ml/min)  Weekly,   R     Comments: while on enoxaparin therapy    04/08/22 2319   04/11/22 0500  CBC  Daily at  5am,   R      04/10/22 1524   04/11/22 [id number]  Basic metabolic panel  Daily at 5am,   R      04/10/22 1524             DVT prophylaxis: enoxaparin (LOVENOX) injection 40 mg Start: 04/09/22 1000   Code Status:   Code Status: Full Code  Family Communication: Family at bedside on 10/10,  10/11 and 10/12. Disposition:    Dispo: The patient is from: home (  recently discharged from SNF)              Anticipated d/c is to: home with [facility]              Anticipated d/c date is:  needs voiding trial, monitor hgb, monitor sign of bleeding ,possible home tomorrow if continue to be stable  Antimicrobials:   Anti-infectives (From admission, onward)    Start     Dose/Rate Route Frequency Ordered Stop   04/09/22 0900  vancomycin (VANCOREADY) IVPB 1250 mg/250 mL  Status:  Discontinued        1,250 mg 166.7 mL/hr over 90 Minutes Intravenous Every 24 hours 04/08/22 1803 04/09/22 1201   04/09/22 0600  ceFEPIme (MAXIPIME) 2 g in sodium chloride 0.9 % 100 mL IVPB        2 g 200 mL/hr over 30 Minutes Intravenous Every 12 hours 04/08/22 1803     04/08/22 2330  metroNIDAZOLE (FLAGYL) IVPB 500 mg        500 mg 100 mL/hr over 60 Minutes Intravenous Every 12 hours 04/08/22 2319 04/15/22 2159   04/08/22 1700  vancomycin (VANCOREADY) IVPB 1750 mg/350 mL        1,750 mg 175 mL/hr over 120 Minutes Intravenous  Once 04/08/22 1647 04/08/22 2320   04/08/22 1645  vancomycin (VANCOCIN) IVPB 1000 mg/200 mL premix  Status:  Discontinued        1,000 mg 200 mL/hr over 60 Minutes Intravenous  Once 04/08/22 1640 04/08/22 1647   04/08/22 1645  ceFEPIme (MAXIPIME) 2 g in sodium chloride 0.9 % 100 mL IVPB        2 g 200 mL/hr over 30 Minutes Intravenous  Once 04/08/22 1640 04/08/22 1741           Objective: Vitals:   04/11/22 0347 04/11/22 0500 04/11/22 0738 04/11/22 1208  BP: 128/86  (!) 147/95 115/72  Pulse: 85  83 98  Resp: (!) 22  (!) 23 (!) 25  Temp: 98.4 F (36.9 C)  98.9 F (37.2 C) 98.7 F (37.1 C)  TempSrc: Oral  Oral Oral  SpO2: 94%  99% 93%  Weight:  79.4 kg    Height:        Intake/Output Summary (Last 24 hours) at 04/11/2022 1344 Last data filed at 04/11/2022 1210 Gross per 24 hour  Intake 957.3 ml  Output 3625 ml  Net -2667.7 ml   Filed Weights   04/08/22 1641 04/10/22 0500 04/11/22 0500  Weight: 84.8 kg 85.1 kg 79.4 kg     Examination:  General exam: NAD, fully alert, interactive, oriented x3 Respiratory system: Clear to auscultation. Respiratory effort normal. Cardiovascular system:  RRR.  Gastrointestinal system: Abdomen is nondistended, soft and nontender.  Normal bowel sounds heard. Central nervous system: orientedx3, right lower extremity weakness from prior stroke Extremities:  no edema Skin: No rashes, lesions or ulcers Psychiatry:  calm and cooperative    Data Reviewed: I have personally reviewed  labs and visualized  imaging studies since the last encounter and formulate the plan        Scheduled Meds:  atorvastatin  80 mg Oral Daily   Chlorhexidine Gluconate Cloth  6 each Topical Daily   enoxaparin (LOVENOX) injection  40 mg Subcutaneous Q24H   mometasone-formoterol  2 puff Inhalation BID   pantoprazole (PROTONIX) IV  40 mg Intravenous Q12H   sodium chloride flush  3 mL Intravenous Q12H   sucralfate  1 g Oral Q6H   tamsulosin  0.4 mg Oral Daily   Continuous Infusions:  ceFEPime (MAXIPIME) IV 2 g (04/11/22 0906)   lactated ringers 75 mL/hr at 04/09/22 1832   metronidazole 500 mg (04/11/22 0907)     LOS: 0 days     Florencia Reasons, MD PhD FACP [facility]  Available via Epic secure chat 7am-7pm for nonurgent issues Please page for urgent issues To page the attending provider between 7A-7P or the covering provider during after hours 7P-7A, please log into the web site [email or web address] and access using universal [place] password for that web site. If you do not have the password, please call the hospital operator.    04/11/2022, 1:44 PM

## 2022-04-11 NOTE — TOC Initial Note (Signed)
Transition of Care [facility]) - Initial/Assessment Note    Patient Details  Name: Adrian Rodgers MRN: [id number] Date of Birth: [birth year]  Transition of Care [facility] CM/SW Contact:    Kermit Balo, RN Phone Number: 04/11/2022, 10:48 AM  Clinical Narrative:                 Pt is from home alone. His daughter checks on  him twice a day. Daughter provides needed transportation and oversees his medications.  Pt has home oxygen through [employer] at 2L [place]. Daughter requesting wheelchair for home. CM has ordered this through [employer] and it will be delivered to the room.  Pt is already active with [facility] for PT/OT/RN. CM will update [facility] and make sure resumption orders are entered.  TOC following.  Expected Discharge Plan: Home w Home Health Services Barriers to Discharge: Continued Medical Work up   Patient Goals and CMS Choice   CMS Medicare.gov Compare Post Acute Care list provided to:: Patient Represenative (must comment) Choice offered to / list presented to : Adult Children  Expected Discharge Plan and Services Expected Discharge Plan: Home w Home Health Services   Discharge Planning Services: CM Consult Post Acute Care Choice: Durable Medical Equipment, Home Health Living arrangements for the past 2 months: Single Family Home                 DME Arranged: Community education officer wheelchair with seat cushion DME Agency: [employer] Date DME Agency Contacted: 04/11/22   Representative spoke with at DME Agency: lacretia            Prior Living Arrangements/Services Living arrangements for the past 2 months: Single Family Home Lives with:: Self Patient language and need for interpreter reviewed:: Yes Do you feel safe going back to the place where you live?: Yes      Need for Family Participation in Patient Care: Yes (Comment) Care giver support system in place?: Yes (comment)   Criminal Activity/Legal Involvement Pertinent to Current Situation/Hospitalization: No -  Comment as needed  Activities of Daily Living Home Assistive Devices/Equipment: Dan Humphreys (specify type) ADL Screening (condition at time of admission) Patient's cognitive ability adequate to safely complete daily activities?: Yes Is the patient deaf or have difficulty hearing?: No Does the patient have difficulty seeing, even when wearing glasses/contacts?: No Does the patient have difficulty concentrating, remembering, or making decisions?: No Patient able to express need for assistance with ADLs?: Yes Does the patient have difficulty dressing or bathing?: No Independently performs ADLs?: Yes (appropriate for developmental age) Does the patient have difficulty walking or climbing stairs?: No Weakness of Legs: None Weakness of Arms/Hands: None  Permission Sought/Granted                  Emotional Assessment Appearance:: Appears stated age Attitude/Demeanor/Rapport: Engaged Affect (typically observed): Accepting Orientation: : Oriented to Self, Oriented to Place, Oriented to  Time, Oriented to Situation   Psych Involvement: No (comment)  Admission diagnosis:  Hypovolemia [E86.1] Hypotension due to hypovolemia [I95.89, E86.1] Patient Active Problem List   Diagnosis Date Noted   Upper GI bleed 04/10/2022   Acute gastric ulcer with hemorrhage 04/10/2022   Dark stools 04/09/2022   Heme positive stool 04/09/2022   Anemia 04/09/2022   Antiplatelet or antithrombotic long-term use 04/09/2022   Hypotension due to hypovolemia 04/08/2022   SIRS (systemic inflammatory response syndrome) (HCC) 04/08/2022   Acute encephalopathy 04/08/2022   Tobacco abuse 03/27/2022   Acute respiratory failure due to COVID-19 [facility]) 03/25/2022  Mood disorder ([place]) 03/28/2021   Generalized weakness 03/27/2021   Ascending aortic aneurysm ([place]) 09/22/2020   Other emphysema ([place]) 05/18/2020   Essential hypertension 05/13/2020   HLD (hyperlipidemia) 05/13/2020   History of CVA (cerebrovascular accident)  05/12/2020   PCP:  Cena Benton, MD Pharmacy:   [facility] [phone] - [place], [address] [place] [address] Phone: [phone] Fax: [phone]     Social Determinants of Health (SDOH) Interventions    Readmission Risk Interventions     No data to display

## 2022-04-11 NOTE — Progress Notes (Signed)
Length of Need 6 Months  The above medical condition requires: Patient requires the ability to reposition frequently  Head must be elevated greater than: 30 degrees  Bed type Semi-electric   

## 2022-04-12 LAB — CBC
HCT: 32.7 % — ABNORMAL LOW (ref 39.0–52.0)
Hemoglobin: 10.9 g/dL — ABNORMAL LOW (ref 13.0–17.0)
MCH: 30.7 pg (ref 26.0–34.0)
MCHC: 33.3 g/dL (ref 30.0–36.0)
MCV: 92.1 fL (ref 80.0–100.0)
Platelets: 443 10*3/uL — ABNORMAL HIGH (ref 150–400)
RBC: 3.55 MIL/uL — ABNORMAL LOW (ref 4.22–5.81)
RDW: 14.1 % (ref 11.5–15.5)
WBC: 9.7 10*3/uL (ref 4.0–10.5)
nRBC: 0 % (ref 0.0–0.2)

## 2022-04-12 LAB — BASIC METABOLIC PANEL
Anion gap: 8 (ref 5–15)
BUN: 10 mg/dL (ref 8–23)
CO2: 27 mmol/L (ref 22–32)
Calcium: 8.3 mg/dL — ABNORMAL LOW (ref 8.9–10.3)
Chloride: 100 mmol/L (ref 98–111)
Creatinine, Ser: 0.86 mg/dL (ref 0.61–1.24)
GFR, Estimated: >60 mL/min (ref >60)
Glucose, Bld: 115 mg/dL — ABNORMAL HIGH (ref 70–99)
Potassium: 3.6 mmol/L (ref 3.5–5.1)
Sodium: 135 mmol/L (ref 135–145)

## 2022-04-12 MED ORDER — POTASSIUM CHLORIDE CRYS ER 20 MEQ PO TBCR
40.0000 meq | EXTENDED_RELEASE_TABLET | Freq: Once | ORAL | Status: AC
  Administered 2022-04-12: 40 meq via ORAL
  Filled 2022-04-12: qty 2

## 2022-04-12 MED ORDER — SENNOSIDES-DOCUSATE SODIUM 8.6-50 MG PO TABS: 1.0000 | ORAL_TABLET | Freq: Every day | ORAL | Status: DC

## 2022-04-12 NOTE — Progress Notes (Signed)
Physical Therapy Treatment Patient Details Name: Adrian Rodgers MRN: [id number] DOB: August 06, 1940 Today's Date: 04/12/2022   History of Present Illness 81 yo male presents to [facility] hospital on 04/08/2022 with loss of apetite, weakness, confusion and hypotension. Pt recently admitted last month with acute hypoxic respiratory failure 2/2 COVID. PMH includes CVA, HTN, COVID.    PT Comments    Patent fatigued quickly in standing and only able to take two steps safely due to L LE buckling and L lateral lean.  Patient needing mod A for balance in standing with +2 A for safety and performed R lateral weight shift for L steps forward and back.  Patient working on sitting balance in chair as well leaning forward and back several time without UE support.  Patient appropriate  for SNF level rehab at d/c.  PT to follow acutely.  Recommendations for follow up therapy are one component of a multi-disciplinary discharge planning process, led by the attending physician.  Recommendations may be updated based on patient status, additional functional criteria and insurance authorization.  Follow Up Recommendations  Skilled nursing-short term rehab (<3 hours/day) Can patient physically be transported by private vehicle: No   Assistance Recommended at Discharge Intermittent Supervision/Assistance  Patient can return home with the following A lot of help with bathing/dressing/bathroom;Two people to help with walking and/or transfers;Assistance with cooking/housework;Assist for transportation;Help with stairs or ramp for entrance   Equipment Recommendations  BSC/3in1    Recommendations for Other Services       Precautions / Restrictions Precautions Precautions: Fall Precaution Comments: L weakness, hypotension     Mobility  Bed Mobility               General bed mobility comments: up in recliner    Transfers Overall transfer level: Needs assistance Equipment used: Rolling walker (2  wheels) Transfers: Sit to/from Stand Sit to Stand: Mod assist, +2 physical assistance           General transfer comment: lifting help to stand from low recliner and assist for anterior weight shift    Ambulation/Gait Ambulation/Gait assistance: Max assist, Mod assist, +2 safety/equipment Gait Distance (Feet): 1 Feet Assistive device: Rolling walker (2 wheels) Gait Pattern/deviations: Decreased weight shift to right, Decreased step length - left, Knee flexed in stance - left, Shuffle       General Gait Details: 2 steps forward with heavy assist for R lateral weight shift and cues for tall posture when on L foot; performed several steps forward and back wtih L for increased R weight shift and max cues and +2 for safety   Stairs             Wheelchair Mobility    Modified Rankin (Stroke Patients Only)       Balance Overall balance assessment: Needs assistance Sitting-balance support: Feet supported Sitting balance-Leahy Scale: Poor Sitting balance - Comments: leans L and posterior in recliner but working on anterior weight shift forward without pulling on armrests x 5 reps Postural control: Posterior lean, Left lateral lean   Standing balance-Leahy Scale: Poor Standing balance comment: mod of 2 to stand with RW and leaning L with sinking onto the L leg, mod A to keep weight to R and cues for posture                            Cognition   Behavior During Therapy: Flat affect Overall Cognitive Status: Impaired/Different from baseline Area of Impairment: Following  commands, Safety/judgement, Awareness, Problem solving                       Following Commands: Follows one step commands with increased time, Follows multi-step commands inconsistently Safety/Judgement: Decreased awareness of safety, Decreased awareness of deficits   Problem Solving: Slow processing, Decreased initiation, Difficulty sequencing, Requires verbal cues, Requires tactile  cues          Exercises General Exercises - Lower Extremity Long Arc Quad: AROM, Left, 10 reps, Seated Hip Flexion/Marching: AROM, Both, 10 reps, Seated Toe Raises: AROM, Both, Seated, 10 reps Heel Raises: Both, Seated, 10 reps    General Comments General comments (skin integrity, edema, etc.): BP stable in sitting after standing 99 systolic      Pertinent Vitals/Pain Pain Assessment Pain Assessment: No/denies pain    Home Living                          Prior Function            PT Goals (current goals can now be found in the care plan section) Progress towards PT goals: Progressing toward goals    Frequency    Min 2X/week      PT Plan Frequency needs to be updated;Current plan remains appropriate    Co-evaluation              AM-PAC PT "6 Clicks" Mobility   Outcome Measure  Help needed turning from your back to your side while in a flat bed without using bedrails?: A Lot Help needed moving from lying on your back to sitting on the side of a flat bed without using bedrails?: A Lot Help needed moving to and from a bed to a chair (including a wheelchair)?: A Lot Help needed standing up from a chair using your arms (e.g., wheelchair or bedside chair)?: Total Help needed to walk in hospital room?: Total Help needed climbing 3-5 steps with a railing? : Total 6 Click Score: 9    End of Session Equipment Utilized During Treatment: Gait belt Activity Tolerance: Patient limited by fatigue Patient left: in chair;with call bell/phone within reach;with chair alarm set   PT Visit Diagnosis: Other abnormalities of gait and mobility (R26.89);Muscle weakness (generalized) (M62.81)     Time: 9470-9628 PT Time Calculation (min) (ACUTE ONLY): 16 min  Charges:  $Neuromuscular Re-education: 8-22 mins                     Magda Kiel, PT Acute Rehabilitation Services Office:[phone] 04/12/2022    Reginia Naas 04/12/2022, 3:54 PM

## 2022-04-12 NOTE — TOC Progression Note (Signed)
Transition of Care [facility]) - Progression Note    Patient Details  Name: Adrian Rodgers MRN: [id number] Date of Birth: 1940-07-28  [facility] CM/SW [place], [place] Phone Number: 04/12/2022, 10:38 AM  Clinical Narrative:   CSW updated by PT that recommendation will be SNF as patient will need 24/7 assist and family unable to provide at this time, but would still prefer to take home if able to arrange 24/7. CSW met with daughter, Denman George, at bedside to discuss and provide SNF choices. Daughter to review and discuss with family, and CSW to follow up tomorrow on decision.     Expected Discharge Plan: [place] Barriers to Discharge: Continued Medical Work up  Expected Discharge Plan and Services Expected Discharge Plan: [place]   Discharge Planning Services: CM Consult Post Acute Care Choice: Durable Medical Equipment, Home Health Living arrangements for the past 2 months: Single Family Home                 DME Arranged: Youth worker wheelchair with seat cushion DME Agency: [employer] Date DME Agency Contacted: 04/11/22   Representative spoke with at DME Agency: lacretia             Social Determinants of Health ([place]) Interventions    Readmission Risk Interventions     No data to display

## 2022-04-12 NOTE — Progress Notes (Signed)
Per MD, pt stable for dc today. SW spoke with pt's dtr Denman George [phone] re dc plan and she is requesting SNF placement. Provided dtr with current offers and she has accepted [facility]. Confirmed with S[facility] at [facility] they are able to accept pt today or over weekend pending auth. Request for [facility] approval has been submitted.   Wandra Feinstein, MSW, LCSW [phone] (coverage)

## 2022-04-12 NOTE — Progress Notes (Signed)
PROGRESS NOTE    Adrian Rodgers  XBW:[id number] DOB: 1941-03-26 DOA: 04/08/2022 PCP: Elsie Amis, MD     Brief Narrative:   CVA, hypertension, COPD, and admission last month for COVID with acute hypoxic respiratory failure, now presenting to the emergency department with loss of appetite, generalized weakness, mild confusion ( family denies), and hypotension.  Subjective:  No acute event reported overnight, no report of dark stool, Blood pressure stable, wbc normalized, hgb stable Urine is clear  He denies sob, no hypoxia at rest, no chest pain, no cough,    He is talking to family on the phone    Assessment & Plan:  Principal Problem:   Hypotension due to hypovolemia Active Problems:   Acute respiratory failure due to COVID-19 (HCC)   Other emphysema (HCC)   History of CVA (cerebrovascular accident)   Essential hypertension   SIRS (systemic inflammatory response syndrome) (HCC)   Acute encephalopathy   Dark stools   Heme positive stool   Anemia   Antiplatelet or antithrombotic long-term use   Upper GI bleed   Acute gastric ulcer with hemorrhage   GI bleed  Hypotension/leukocytosis -Probably combination from dehydration, relatively adrenal insufficiency from recent steroid use - blood culture no growth, urine culture no growth, MRSA screening negative, COVID screening negative, CTA chest showed ill-defined groundglass opacity left lower lobe, nonobstructive right kidney stone, he does not appear to have respiratory symptoms, denies back pain,  -he is treated with vanc on admission, then cefepime and Flagyl  x4 days, wbc normalized, bp normalized, change to augmentin to finish total 5 days abx treatment.  -appear adequately hydrated, off ivf, encourage oral intake, continue hold Cozaar   Acute urinary retention, 800 cc urine drained after Foley insertion  -Blood in urine  initially after foley placement, suspect Foley trauma, and initial UA did not show  blood in urine -Urine is clear today,   -urine culture no growth -continue Flomax -Will need voiding trial prior to discharge, outpatient urology follow up    GI bleed, acute blood loss anemia, Recent steroids use, also reports alcohol use -S/p EGD on 10/11 with  Large gastric ulcers , treated, detail please see GI note. -avoid NSAIDS, plavix held since admission, plan to resume on 10/15 per Gi recommendation,  continue ppi,   - hgb around 11  Acute hypoxic respiratory failure, from atelectasis ? -RN report patient had hypoxia, 02 dropped to 89%, he was put on 3liter oxygen -cxr no acute findings, negative  tropoinin, CTA chest negative for  PE, showed " emphysema and stable ill-defined ground-glass opacity in the left upper lobe ", h/o recent h/o covid infection  -he denies respiratory symptoms, appear able to wean off oxygen -family reports patient has pulmonary follow-up in November.  H/o CVA with Has residual left-sided weakness.   Plavix held due to GI bleed, resume on 10/15 if no more bleeding  Continue statin  Mood disorder P.o. bupropion  Tobacco use One pack a day, nicotine patch  Physical deconditioning reports previously lives alone, but progressive weakness after covid infection  Seen by PT , recommend SNF, patient and family in agreement     I have Reviewed nursing notes, Vitals, pain scores, I/o's, Lab results and  imaging results since pt's last encounter, details please see discussion above  I ordered the following labs:  Unresulted Labs (From admission, onward)     Start     Ordered   04/15/22 0500  Creatinine, serum  (enoxaparin (LOVENOX)  CrCl >/= 30 ml/min)  Weekly,   R     Comments: while on enoxaparin therapy    04/08/22 2319             DVT prophylaxis: enoxaparin (LOVENOX) injection 40 mg Start: 04/09/22 1000   Code Status:   Code Status: Full Code  Family Communication: Family at bedside on 10/10,  10/11 and 10/12. Disposition:     Dispo: The patient is from: home ( recently discharged from SNF)              Anticipated d/c is to: SNF              Anticipated d/c date is:  monitor sign of bleeding , snf on 10/14, need voiding trial before discharge  Antimicrobials:   Anti-infectives (From admission, onward)    Start     Dose/Rate Route Frequency Ordered Stop   04/11/22 1445  amoxicillin-clavulanate (AUGMENTIN) 875-125 MG per tablet 1 tablet        1 tablet Oral Every 12 hours 04/11/22 1346 04/13/22 0959   04/09/22 0900  vancomycin (VANCOREADY) IVPB 1250 mg/250 mL  Status:  Discontinued        1,250 mg 166.7 mL/hr over 90 Minutes Intravenous Every 24 hours 04/08/22 1803 04/09/22 1201   04/09/22 0600  ceFEPIme (MAXIPIME) 2 g in sodium chloride 0.9 % 100 mL IVPB  Status:  Discontinued        2 g 200 mL/hr over 30 Minutes Intravenous Every 12 hours 04/08/22 1803 04/11/22 1346   04/08/22 2330  metroNIDAZOLE (FLAGYL) IVPB 500 mg  Status:  Discontinued        500 mg 100 mL/hr over 60 Minutes Intravenous Every 12 hours 04/08/22 2319 04/11/22 1346   04/08/22 1700  vancomycin (VANCOREADY) IVPB 1750 mg/350 mL        1,750 mg 175 mL/hr over 120 Minutes Intravenous  Once 04/08/22 1647 04/08/22 2320   04/08/22 1645  vancomycin (VANCOCIN) IVPB 1000 mg/200 mL premix  Status:  Discontinued        1,000 mg 200 mL/hr over 60 Minutes Intravenous  Once 04/08/22 1640 04/08/22 1647   04/08/22 1645  ceFEPIme (MAXIPIME) 2 g in sodium chloride 0.9 % 100 mL IVPB        2 g 200 mL/hr over 30 Minutes Intravenous  Once 04/08/22 1640 04/08/22 1741           Objective: Vitals:   04/12/22 0422 04/12/22 0500 04/12/22 0755 04/12/22 1121  BP: 105/63  123/81 120/78  Pulse: 76  89 93  Resp: 17  17 20   Temp: 98 F (36.7 C)  98.2 F (36.8 C) 98.9 F (37.2 C)  TempSrc: Axillary  Oral Oral  SpO2: 91%  95% 95%  Weight:  80.2 kg    Height:        Intake/Output Summary (Last 24 hours) at 04/12/2022 1129 Last data filed at  04/12/2022 0424 Gross per 24 hour  Intake 338.55 ml  Output 1000 ml  Net -661.45 ml   Filed Weights   04/10/22 0500 04/11/22 0500 04/12/22 0500  Weight: 85.1 kg 79.4 kg 80.2 kg    Examination:  General exam: NAD, fully alert, interactive, oriented x3 Respiratory system: Clear to auscultation. Respiratory effort normal. Cardiovascular system:  RRR.  Gastrointestinal system: Abdomen is nondistended, soft and nontender.  Normal bowel sounds heard. Central nervous system: orientedx3, right lower extremity weakness from prior stroke Extremities:  no edema Skin: No rashes, lesions or ulcers  Psychiatry:  calm and cooperative    Data Reviewed: I have personally reviewed  labs and visualized  imaging studies since the last encounter and formulate the plan        Scheduled Meds:  amoxicillin-clavulanate  1 tablet Oral Q12H   atorvastatin  80 mg Oral Daily   Chlorhexidine Gluconate Cloth  6 each Topical Daily   enoxaparin (LOVENOX) injection  40 mg Subcutaneous Q24H   mometasone-formoterol  2 puff Inhalation BID   pantoprazole (PROTONIX) IV  40 mg Intravenous Q12H   potassium chloride  40 mEq Oral Once   sodium chloride flush  3 mL Intravenous Q12H   sucralfate  1 g Oral Q6H   tamsulosin  0.4 mg Oral Daily   Continuous Infusions:     LOS: 1 day     Florencia Reasons, MD PhD FACP [facility]  Available via Epic secure chat 7am-7pm for nonurgent issues Please page for urgent issues To page the attending provider between 7A-7P or the covering provider during after hours 7P-7A, please log into the web site [email or web address] and access using universal [place] password for that web site. If you do not have the password, please call the hospital operator.    04/12/2022, 11:29 AM

## 2022-04-13 ENCOUNTER — Encounter (HOSPITAL_COMMUNITY): Payer: Self-pay | Admitting: Gastroenterology

## 2022-04-13 LAB — CULTURE, BLOOD (ROUTINE X 2)
Culture: NO GROWTH
Culture: NO GROWTH
Special Requests: ADEQUATE
Special Requests: ADEQUATE

## 2022-04-13 MED ORDER — TAMSULOSIN HCL 0.4 MG PO CAPS: 0.4000 mg | ORAL_CAPSULE | Freq: Every day | ORAL | 0 refills | Status: AC

## 2022-04-13 MED ORDER — LOSARTAN POTASSIUM 25 MG PO TABS: 25.0000 mg | ORAL_TABLET | Freq: Every day | ORAL | 0 refills | Status: DC

## 2022-04-13 MED ORDER — SUCRALFATE 1 G PO TABS: 1.0000 g | ORAL_TABLET | Freq: Three times a day (TID) | ORAL | 0 refills | Status: DC

## 2022-04-13 MED ORDER — POLYETHYLENE GLYCOL 3350 17 G PO PACK: 17.0000 g | PACK | Freq: Every day | ORAL | Status: DC

## 2022-04-13 MED ORDER — PANTOPRAZOLE SODIUM 40 MG PO TBEC: 40.0000 mg | DELAYED_RELEASE_TABLET | Freq: Two times a day (BID) | ORAL | 0 refills | Status: DC

## 2022-04-13 NOTE — TOC Transition Note (Signed)
Transition of Care [facility]) - CM/SW Discharge Note   Patient Details  Name: Adrian Rodgers MRN: [id number] Date of Birth: 30-Nov-1940  [facility] CM/SW Contact:  Amador Cunas, [place] Phone Number: 04/13/2022, 2:15 PM   Clinical Narrative: Pt for dc to [facility] today. Auth received and [facility] at [facility] confirmed they are prepared to admit him to room 608. Pt's dtr aware of dc and reports agreeable. [facility] called for transport and RN provided with number for report. SW signing off at dc.   Wandra Feinstein, MSW, LCSW [phone] (coverage)      Final next level of care: Skilled Nursing Facility Barriers to Discharge: No Barriers Identified   Patient Goals and CMS Choice   CMS Medicare.gov Compare Post Acute Care list provided to:: Patient Represenative (must comment) Choice offered to / list presented to : Adult Children, Patient  Discharge Placement              Patient chooses bed at: [facility] Patient to be transferred to facility by: [place] Name of family member notified: [name]/dtr Patient and family notified of of transfer: 04/13/22  Discharge Plan and Services   Discharge Planning Services: CM Consult Post Acute Care Choice: Durable Medical Equipment, Home Health          DME Arranged: Lightweight manual wheelchair with seat cushion DME Agency: [employer] Date DME Agency Contacted: 04/11/22   Representative spoke with at DME Agency: lacretia            Social Determinants of Health ([place]) Interventions     Readmission Risk Interventions     No data to display

## 2022-04-13 NOTE — Progress Notes (Signed)
[facility] arrived to transfer pt. Pt left in stable condition, no belongings at bedside, report called to [facility] at 2135. AVS printed and sent with pt.

## 2022-04-13 NOTE — Progress Notes (Signed)
Voicemail left for facility to call for report.

## 2022-04-13 NOTE — Discharge Summary (Addendum)
Discharge Summary  Adrian Rodgers WCH:[id number] DOB: 13-Apr-1941  PCP: Cena Benton, MD  Admit date: 04/08/2022 Discharge date: 04/13/2022  30 Day Unplanned Readmission Risk Score    Flowsheet Row ED to Hosp-Admission (Current) from 04/08/2022 in [facility]  30 Day Unplanned Readmission Risk Score (%) 18.42 Filed at 04/13/2022 0800       This score is the patient's risk of an unplanned readmission within 30 days of being discharged (0 -100%). The score is based on dignosis, age, lab data, medications, orders, and past utilization.   Low:  0-14.9   Medium: 15-21.9   High: 22-29.9   Extreme: 30 and above         Time spent: 59mins, more than 50% time spent on coordination of care.   Recommendations for Outpatient Follow-up:  F/u with SNF MD within a week  for hospital discharge follow up, repeat cbc/bmp at follow up F/u with [facility] on 11/20 F/u with urology for urinary retention     Discharge Diagnoses:  Active Hospital Problems   Diagnosis Date Noted   Hypotension due to hypovolemia 04/08/2022   Acute respiratory failure due to COVID-19 [facility]) 03/25/2022    Priority: 1.   Other emphysema ([place]) 05/18/2020    Priority: 2.   History of CVA (cerebrovascular accident) 05/12/2020    Priority: 3.   Essential hypertension 05/13/2020    Priority: 4.   GI bleed 04/11/2022   Upper GI bleed 04/10/2022   Acute gastric ulcer with hemorrhage 04/10/2022   Dark stools 04/09/2022   Heme positive stool 04/09/2022   Anemia 04/09/2022   Antiplatelet or antithrombotic long-term use 04/09/2022   SIRS (systemic inflammatory response syndrome) ([place]) 04/08/2022   Acute encephalopathy 04/08/2022    Resolved Hospital Problems  No resolved problems to display.    Discharge Condition: stable  Diet recommendation: heart healthy  Filed Weights   04/10/22 0500 04/11/22 0500 04/12/22 0500  Weight: 85.1 kg 79.4 kg 80.2 kg    History of present illness: ( per  admitting MD Dr Myna Hidalgo) Patient coming from: Home    Chief Complaint: Weakness, confusion, hypotension    HPI: Adrian Rodgers is a pleasant 81 y.o. male with medical history significant for history of CVA, hypertension, COPD, and admission last month for COVID with acute hypoxic respiratory failure, now presenting to the emergency department with loss of appetite, generalized weakness, mild confusion, and hypotension.   Patient was discharged from the hospital 10 days ago and has had poor appetite at home, stating that he has lost his sense of taste, and has grown progressively weak in general.  He was seen his new PCP today in clinic and was noted to have blood pressure of 82/52.  He was mildly confused.  EMS was called, blood pressure was 78/40, and he was given 800 mL of NS prior to arrival in the ED.   He denies abdominal pain, chest pain, shortness of breath, nausea, vomiting, or diarrhea.  He just completed a course of prednisone.  He has been using 2 L/min of supplemental oxygen at home since the recent discharge.   ED Course: Upon arrival to the ED, patient is found to be afebrile and saturating in the low to mid 90s on room air with transient tachypnea, normal heart rate, and stable blood pressure.  EKG features ectopic atrial rhythm.  Head CT negative for acute cranial abnormality.  Chest x-ray negative for acute cardiopulmonary disease.  Chemistry panel notable for BUN of 25.  CBC features a leukocytosis to 16,600 and mild thrombocytosis.  Lactic acid is normal.     Blood and urine cultures were collected and the patient was given 2.5 L of LR, vancomycin, cefepime, and had Foley catheter placed in the ED.    Hospital Course:  Principal Problem:   Hypotension due to hypovolemia Active Problems:   Acute respiratory failure due to COVID-19 [facility])   Other emphysema (HCC)   History of CVA (cerebrovascular accident)   Essential hypertension   SIRS (systemic inflammatory response syndrome)  (HCC)   Acute encephalopathy   Dark stools   Heme positive stool   Anemia   Antiplatelet or antithrombotic long-term use   Upper GI bleed   Acute gastric ulcer with hemorrhage   GI bleed   Hypotension/leukocytosis  -he was sent to the ED by pcp due to hypotension and leukocytosis -Probably combination from dehydration, relatively adrenal insufficiency from recent steroid use - blood culture no growth, urine culture no growth, MRSA screening negative, COVID screening negative, CTA chest showed ill-defined groundglass opacity left lower lobe, nonobstructive right kidney stone, he does not appear to have respiratory symptoms, denies back pain,  -he is treated with vanc on admission, then cefepime and Flagyl  x4 days, he received hydration, wbc normalized, bp normalized, change to augmentin and finished total 5 days abx treatment.  -encourage oral intake, home meds Cozaar held in the hospital, resumed at a lower dose at discharge     Acute urinary retention, 800 cc urine drained after Foley insertion  -Blood in urine x1 initially after foley placement, suspect Foley trauma, and initial UA did not show blood in urine -Urine is clear   -urine culture no growth -continue Flomax -voiding trial prior to discharge appears successful, outpatient urology follow up       GI bleed, acute blood loss anemia, Recent steroids use, also reports alcohol use -S/p EGD on 10/11 with  Large gastric ulcers , treated, detail please see GI note. -avoid NSAIDS, plavix held since admission, GI oked to resume on 10/15,  continue protonix bid and carafate, f/u with GI on 11/20 - hgb around 11, stool is brown at discharge   Acute hypoxic respiratory failure, from atelectasis ? -RN report patient had hypoxia, 02 dropped to 89%, he was put on 3liter oxygen -cxr no acute findings, negative  tropoinin, CTA chest negative for  PE, showed " emphysema and stable ill-defined ground-glass opacity in the left upper lobe ",  h/o recent h/o covid infection  -he denies respiratory symptoms, he is weaned off oxygen -family reports patient has pulmonary follow-up in November.   H/o CVA with Has residual left-sided weakness.   Plavix held due to GI bleed, resume on 10/15 if no more bleeding  Continue statin   Mood disorder P.o. bupropion   Tobacco use One pack a day, nicotine patch   Physical deconditioning reports previously lives alone, but progressive weakness after covid infection  Seen by PT , recommend SNF, patient and family in agreement     Discharge Exam: BP 137/89 (BP Location: Left Arm)   Pulse 71   Temp 98.4 F (36.9 C) (Oral)   Resp 17   Ht 5\' 8"  (1.727 m)   Wt 80.2 kg   SpO2 97%   BMI 26.88 kg/m   General: NAD Cardiovascular: RRR Respiratory: normal respiratory effort     Discharge Instructions     Diet - low sodium heart healthy   Complete by: As directed  Increase activity slowly   Complete by: As directed       Allergies as of 04/13/2022   No Known Allergies      Medication List     STOP taking these medications    predniSONE 20 MG tablet Commonly known as: DELTASONE       TAKE these medications    acetaminophen 500 MG tablet Commonly known as: TYLENOL Take 1,000 mg by mouth as needed for moderate pain, mild pain, fever or headache.   albuterol 108 (90 Base) MCG/ACT inhaler Commonly known as: VENTOLIN HFA Inhale 2 puffs into the lungs every 6 (six) hours as needed for wheezing or shortness of breath. What changed: when to take this   atorvastatin 80 MG tablet Commonly known as: LIPITOR Take 1 tablet (80 mg total) by mouth daily.   buPROPion 150 MG 24 hr tablet Commonly known as: WELLBUTRIN XL Take 150 mg by mouth daily.   Centrum Silver 50+Men Tabs Take 2 tablets by mouth daily with breakfast.   clopidogrel 75 MG tablet Commonly known as: PLAVIX Take 1 tablet by mouth daily.   losartan 25 MG tablet Commonly known as: Cozaar Take 1  tablet (25 mg total) by mouth daily. What changed:  medication strength how much to take   mometasone-formoterol 100-5 MCG/ACT Aero Commonly known as: DULERA Inhale 2 puffs into the lungs 2 (two) times daily.   nicotine 14 mg/24hr patch Commonly known as: NICODERM CQ - dosed in mg/24 hours Place 14 mg onto the skin daily.   pantoprazole 40 MG tablet Commonly known as: Protonix Take 1 tablet (40 mg total) by mouth 2 (two) times daily before a meal.   sucralfate 1 g tablet Commonly known as: CARAFATE Take 1 tablet (1 g total) by mouth 4 (four) times daily -  with meals and at bedtime for 7 days.   tamsulosin 0.4 MG Caps capsule Commonly known as: FLOMAX Take 1 capsule (0.4 mg total) by mouth daily. Start taking on: April 14, 2022               Durable Medical Equipment  (From admission, onward)           Start     Ordered   04/11/22 1609  For home use only DME 3 n 1  Once        04/11/22 1609   04/11/22 1047  For home use only DME Hospital bed  Once       Question Answer Comment  Length of Need 6 Months   The above medical condition requires: Patient requires the ability to reposition frequently   Head must be elevated greater than: 30 degrees   Bed type Semi-electric      04/11/22 1046   04/10/22 1639  For home use only DME lightweight manual wheelchair with seat cushion  Once       Comments: Patient suffers from weakness which impairs their ability to perform daily activities like bathing, grooming, and toileting in the home.  A walker will not resolve  issue with performing activities of daily living. A wheelchair will allow patient to safely perform daily activities. Patient is not able to propel themselves in the home using a standard weight wheelchair due to general weakness. Patient can self propel in the lightweight wheelchair. Length of need Lifetime. Accessories: elevating leg rests (ELRs), wheel locks, extensions and anti-tippers.   04/10/22 1639            No Known Allergies  Contact information for after-discharge care     Destination     [facility] .   Service: Skilled Nursing Contact information: [address] [place] [phone]                      The results of significant diagnostics from this hospitalization (including imaging, microbiology, ancillary and laboratory) are listed below for reference.    Significant Diagnostic Studies: CT Angio Chest Pulmonary Embolism (PE) W or WO Contrast  Result Date: 04/09/2022 CLINICAL DATA:  Pulmonary embolism suspected, high probability syncopal episode with hypoxia. Recent COVID infection. EXAM: CT ANGIOGRAPHY CHEST WITH CONTRAST TECHNIQUE: Multidetector CT imaging of the chest was performed using the standard protocol during bolus administration of intravenous contrast. Multiplanar CT image reconstructions and MIPs were obtained to evaluate the vascular anatomy. RADIATION DOSE REDUCTION: This exam was performed according to the departmental dose-optimization program which includes automated exposure control, adjustment of the mA and/or kV according to patient size and/or use of iterative reconstruction technique. CONTRAST:  80mL OMNIPAQUE IOHEXOL 350 MG/ML SOLN COMPARISON:  Radiographs 04/08/2022.  CT 03/29/2022 and 09/28/2021. FINDINGS: Cardiovascular: The pulmonary arteries are well opacified with contrast to the level of the subsegmental branches. There is no evidence of acute pulmonary embolism. Mild atherosclerosis of the aorta, great vessels and coronary arteries. Although systemic arterial enhancement is suboptimal, no acute abnormalities are identified. The heart size is normal. There is no pericardial effusion. Mediastinum/Nodes: There are no enlarged mediastinal, hilar or axillary lymph nodes.Unchanged small mediastinal lymph nodes. The thyroid gland, trachea and esophagus demonstrate no significant findings. Lungs/Pleura:  No pleural effusion or pneumothorax. Moderate centrilobular and paraseptal emphysema with scattered atelectasis or scarring at both lung bases. An ill-defined ground-glass in the left upper lobe measuring approximately 1.4 cm on image 26/2 is unchanged, without increasing solid components. No confluent airspace opacity or enlarging nodule identified. Upper abdomen: No acute or suspicious findings are seen within the visualized upper abdomen. There are small nonobstructing right renal calculi. There are grossly unchanged renal cysts bilaterally which do not require any specific imaging follow-up. Musculoskeletal/Chest wall: There is no chest wall mass or suspicious osseous finding. Review of the MIP images confirms the above findings. IMPRESSION: 1. No evidence of acute pulmonary embolism or other acute chest findings. 2. Stable ill-defined ground-glass opacity in the left upper lobe. 3. Nonobstructing right renal calculi. 4. Aortic Atherosclerosis (ICD10-I70.0) and Emphysema (ICD10-J43.9). Electronically Signed   By: Richardean Sale M.D.   On: 04/09/2022 17:54   ECHOCARDIOGRAM COMPLETE  Result Date: 04/09/2022    ECHOCARDIOGRAM REPORT   Patient Name:   ORIEN KURTYKA Date of Exam: 04/09/2022 Medical Rec #:  [id number]       Height:       68.0 in Accession #:    [id number]      Weight:       187.0 lb Date of Birth:  12/20/1940       BSA:          1.986 m Patient Age:    37 years        BP:           96/65 mmHg Patient Gender: M               HR:           98 bpm. Exam Location:  Inpatient Procedure: 2D Echo, Cardiac Doppler and Color Doppler Indications:    R55 Syncope  History:  Patient has prior history of Echocardiogram examinations, most                 recent 03/27/2021. Abnormal ECG, Stroke and COPD,                 Arrythmias:Atrial Fibrillation, Signs/Symptoms:Syncope and                 Altered Mental Status; Risk Factors:Dyslipidemia, Current Smoker                 and Hypertension.  Sonographer:     Roseanna Rainbow RDCS Referring Phys: [id number] [place]  1. Left ventricular ejection fraction, by estimation, is 70 to 75%. The left ventricle has hyperdynamic function. The left ventricle has no regional wall motion abnormalities. There is mild concentric left ventricular hypertrophy. Left ventricular diastolic parameters are consistent with Grade I diastolic dysfunction (impaired relaxation). Intracavitary gradient due to hyperdynamic function.  2. Right ventricular systolic function is hyperdynamic. The right ventricular size is normal. Tricuspid regurgitation signal is inadequate for assessing PA pressure.  3. The mitral valve is normal in structure. No evidence of mitral valve regurgitation. No evidence of mitral stenosis.  4. The aortic valve was not well visualized. Aortic valve regurgitation is not visualized. No aortic stenosis is present.  5. Aortic dilatation noted. There is mild dilatation of the ascending aorta, measuring 42 mm.  6. The inferior vena cava is normal in size with greater than 50% respiratory variability, suggesting right atrial pressure of 3 mmHg. Comparison(s): No significant change from prior study. FINDINGS  Left Ventricle: Left ventricular ejection fraction, by estimation, is 70 to 75%. The left ventricle has hyperdynamic function. The left ventricle has no regional wall motion abnormalities. The left ventricular internal cavity size was small. There is mild concentric left ventricular hypertrophy. Left ventricular diastolic parameters are consistent with Grade I diastolic dysfunction (impaired relaxation). Right Ventricle: The right ventricular size is normal. No increase in right ventricular wall thickness. Right ventricular systolic function is hyperdynamic. Tricuspid regurgitation signal is inadequate for assessing PA pressure. Left Atrium: Left atrial size was normal in size. Right Atrium: Right atrial size was normal in size. Pericardium: There is no evidence of  pericardial effusion. Mitral Valve: The mitral valve is normal in structure. No evidence of mitral valve regurgitation. No evidence of mitral valve stenosis. Tricuspid Valve: The tricuspid valve is normal in structure. Tricuspid valve regurgitation is not demonstrated. No evidence of tricuspid stenosis. Aortic Valve: The aortic valve was not well visualized. Aortic valve regurgitation is not visualized. No aortic stenosis is present. Pulmonic Valve: The pulmonic valve was normal in structure. Pulmonic valve regurgitation is not visualized. No evidence of pulmonic stenosis. Aorta: Aortic dilatation noted. There is mild dilatation of the ascending aorta, measuring 42 mm. Venous: The inferior vena cava is normal in size with greater than 50% respiratory variability, suggesting right atrial pressure of 3 mmHg. IAS/Shunts: No atrial level shunt detected by color flow Doppler.  LEFT VENTRICLE PLAX 2D LVIDd:         3.10 cm     Diastology LVIDs:         1.80 cm     LV e' medial:    6.31 cm/s LV PW:         1.20 cm     LV E/e' medial:  7.3 LV IVS:        1.30 cm     LV e' lateral:   9.64 cm/s LVOT diam:  2.40 cm     LV E/e' lateral: 4.8 LV SV:         66 LV SV Index:   33 LVOT Area:     4.52 cm  LV Volumes (MOD) LV vol d, MOD A2C: 83.2 ml LV vol d, MOD A4C: 42.6 ml LV vol s, MOD A2C: 24.4 ml LV vol s, MOD A4C: 12.7 ml LV SV MOD A2C:     58.8 ml LV SV MOD A4C:     42.6 ml LV SV MOD BP:      43.0 ml RIGHT VENTRICLE             IVC RV S prime:     10.60 cm/s  IVC diam: 1.30 cm TAPSE (M-mode): 1.8 cm LEFT ATRIUM             Index        RIGHT ATRIUM           Index LA diam:        3.10 cm 1.56 cm/m   RA Area:     19.90 cm LA Vol (A2C):   37.6 ml 18.93 ml/m  RA Volume:   53.60 ml  26.99 ml/m LA Vol (A4C):   20.3 ml 10.22 ml/m LA Biplane Vol: 29.6 ml 14.90 ml/m  AORTIC VALVE LVOT Vmax:   98.40 cm/s LVOT Vmean:  56.000 cm/s LVOT VTI:    0.145 m  AORTA Ao Root diam: 3.10 cm Ao Asc diam:  4.20 cm MITRAL VALVE MV Area  (PHT): 7.82 cm     SHUNTS MV Decel Time: 97 msec      Systemic VTI:  0.14 m MV E velocity: 45.90 cm/s   Systemic Diam: 2.40 cm MV A velocity: 106.00 cm/s MV E/A ratio:  0.43 Rudean Haskell MD Electronically signed by Rudean Haskell MD Signature Date/Time: 04/09/2022/4:28:12 PM    Final    CT Head Wo Contrast  Result Date: 04/08/2022 CLINICAL DATA:  Altered mental status, recent COVID EXAM: CT HEAD WITHOUT CONTRAST TECHNIQUE: Contiguous axial images were obtained from the base of the skull through the vertex without intravenous contrast. RADIATION DOSE REDUCTION: This exam was performed according to the departmental dose-optimization program which includes automated exposure control, adjustment of the mA and/or kV according to patient size and/or use of iterative reconstruction technique. COMPARISON:  03/27/2021 FINDINGS: Brain: No evidence of acute infarction, hemorrhage, hydrocephalus, extra-axial collection or mass lesion/mass effect. Global cortical atrophy. Subcortical white matter and periventricular small vessel ischemic changes. Vascular: Intracranial atherosclerosis. Skull: Normal. Negative for fracture or focal lesion. Sinuses/Orbits: The visualized paranasal sinuses are essentially clear. The mastoid air cells are unopacified. Other: None. IMPRESSION: No acute intracranial abnormality. Atrophy with small vessel ischemic changes. Electronically Signed   By: Julian Hy M.D.   On: 04/08/2022 21:04   DG Chest Port 1 View  Result Date: 04/08/2022 CLINICAL DATA:  Questionable sepsis - evaluate for abnormality EXAM: PORTABLE CHEST 1 VIEW COMPARISON:  March 25, 2022 FINDINGS: The cardiomediastinal silhouette is unchanged in contour.Atherosclerotic calcifications. Cardiac loop recorder. No pleural effusion. No pneumothorax. No acute pleuroparenchymal abnormality. Visualized abdomen is unremarkable. IMPRESSION: No acute cardiopulmonary abnormality. Electronically Signed   By:  Valentino Saxon M.D.   On: 04/08/2022 17:32   CT CHEST W CONTRAST  Result Date: 03/29/2022 CLINICAL DATA:  Shortness of breath, COVID positive EXAM: CT CHEST WITH CONTRAST TECHNIQUE: Multidetector CT imaging of the chest was performed during intravenous contrast administration. RADIATION DOSE REDUCTION: This exam was performed  according to the departmental dose-optimization program which includes automated exposure control, adjustment of the mA and/or kV according to patient size and/or use of iterative reconstruction technique. CONTRAST:  63mL OMNIPAQUE IOHEXOL 300 MG/ML  SOLN COMPARISON:  Previous chest radiographs done on 03/25/2022 and CT done on 09/28/2021 FINDINGS: Cardiovascular: Coronary artery calcifications are seen. There is homogeneous enhancement in thoracic aorta. 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 There is ectasia of main pulmonary artery measuring 3.4 cm. There are no intraluminal filling defects in central pulmonary artery branches. Mediastinum/Nodes: There are slightly enlarged lymph nodes in mediastinum and hilar regions suggesting possible reactive hyperplasia. Lungs/Pleura: Centrilobular emphysema is seen. There is small focus of increased interstitial markings in the lateral aspect of left upper lobe in image 37 of series 5 which has not changed, possibly suggesting scarring. There are few linear densities in both lower lung fields suggesting subsegmental atelectasis/scarring. There is mild ectasia of bronchi in the lower lung fields. There is no focal pulmonary consolidation. There is no pleural effusion or pneumothorax. Upper Abdomen: There is fatty infiltration in liver. There are multiple smooth marginated low-density lesions in the visualized upper portions of both kidneys largest measuring 4.9 cm. Musculoskeletal: Degenerative changes are noted in lower cervical spine with encroachment of neural foramina at C6-C7 level. IMPRESSION: There is no evidence  of central pulmonary artery embolism. There is ectasia of main pulmonary artery suggesting possible pulmonary arterial hypertension. There is no evidence of thoracic aortic dissection. Minimal pericardial effusion. Coronary artery calcifications are seen. 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 Recommend annual imaging followup by CTA or MRA. This recommendation follows 2010 ACCF/AHA/AATS/ACR/ASA/SCA/SCAI/SIR/STS/SVM Guidelines for the Diagnosis and Management of Patients with Thoracic Aortic Disease. Circulation. 2010; [id number]. Aortic aneurysm NOS (ICD10-I71.9) COPD. Linear densities in both lower lung fields have not changed significantly suggesting scarring or subsegmental atelectasis. There are no new infiltrates or new nodules in the lung fields. Fatty liver.  Bilateral renal cysts.  Cervical spondylosis. Electronically Signed   By: Elmer Picker M.D.   On: 03/29/2022 12:04   DG Chest 2 View  Result Date: 03/25/2022 CLINICAL DATA:  Shortness of breath, cough. EXAM: CHEST - 2 VIEW COMPARISON:  October 09, 2017. FINDINGS: The heart size and mediastinal contours are within normal limits. Both lungs are clear. The visualized skeletal structures are unremarkable. IMPRESSION: No active cardiopulmonary disease. Electronically Signed   By: Marijo Conception M.D.   On: 03/25/2022 15:05   CUP PACEART REMOTE DEVICE CHECK  Result Date: 03/19/2022 ILR summary report received. Battery status OK. Normal device function. No new symptom, brady, or pause episodes. No new AF episodes. Monthly summary reports and ROV/PRN 1 new tachy event 8/28 @ 11:10, duration 68min 30sec, HR 214, EGM shows narrow regular rhythm.  Previously viewed and reviewed as alert [place]   Microbiology: Recent Results (from the past 240 hour(s))  Resp Panel by RT-PCR (Flu A&B, Covid) Anterior Nasal Swab     Status: None   Collection Time: 04/08/22  4:40 PM   Specimen: Anterior Nasal Swab  Result Value Ref Range  Status   SARS Coronavirus 2 by RT PCR NEGATIVE NEGATIVE Final    Comment: (NOTE) SARS-CoV-2 target nucleic acids are NOT DETECTED.  The SARS-CoV-2 RNA is generally detectable in upper respiratory specimens during the acute phase of infection. The lowest concentration of SARS-CoV-2 viral copies this assay can detect is 138 copies/mL. A negative result does not preclude SARS-Cov-2 infection and  should not be used as the sole basis for treatment or other patient management decisions. A negative result may occur with  improper specimen collection/handling, submission of specimen other than nasopharyngeal swab, presence of viral mutation(s) within the areas targeted by this assay, and inadequate number of viral copies(<138 copies/mL). A negative result must be combined with clinical observations, patient history, and epidemiological information. The expected result is Negative.  Fact Sheet for Patients:  EntrepreneurPulse.com.au  Fact Sheet for Healthcare Providers:  IncredibleEmployment.be  This test is no t yet approved or cleared by the [place] FDA and  has been authorized for detection and/or diagnosis of SARS-CoV-2 by FDA under an Emergency Use Authorization (EUA). This EUA will remain  in effect (meaning this test can be used) for the duration of the COVID-19 declaration under Section 564(b)(1) of the Act, 21 U.S.C.section 360bbb-3(b)(1), unless the authorization is terminated  or revoked sooner.       Influenza A by PCR NEGATIVE NEGATIVE Final   Influenza B by PCR NEGATIVE NEGATIVE Final    Comment: (NOTE) The Xpert Xpress SARS-CoV-2/FLU/RSV plus assay is intended as an aid in the diagnosis of influenza from Nasopharyngeal swab specimens and should not be used as a sole basis for treatment. Nasal washings and aspirates are unacceptable for Xpert Xpress SARS-CoV-2/FLU/RSV testing.  Fact Sheet for  Patients: EntrepreneurPulse.com.au  Fact Sheet for Healthcare Providers: IncredibleEmployment.be  This test is not yet approved or cleared by the [place] FDA and has been authorized for detection and/or diagnosis of SARS-CoV-2 by FDA under an Emergency Use Authorization (EUA). This EUA will remain in effect (meaning this test can be used) for the duration of the COVID-19 declaration under Section 564(b)(1) of the Act, 21 U.S.C. section 360bbb-3(b)(1), unless the authorization is terminated or revoked.  Performed at [facility], [place] [address], [place], [place] [address]   Urine Culture     Status: None   Collection Time: 04/08/22  4:40 PM   Specimen: In/Out Cath Urine  Result Value Ref Range Status   Specimen Description IN/OUT CATH URINE  Final   Special Requests NONE  Final   Culture   Final    NO GROWTH Performed at [facility], [place] [address], [place], [place] [address]    Report Status 04/10/2022 FINAL  Final  Blood Culture (routine x 2)     Status: None   Collection Time: 04/08/22  4:55 PM   Specimen: BLOOD  Result Value Ref Range Status   Specimen Description BLOOD RIGHT ANTECUBITAL  Final   Special Requests   Final    BOTTLES DRAWN AEROBIC AND ANAEROBIC Blood Culture adequate volume   Culture   Final    NO GROWTH 5 DAYS Performed at [facility], [place] [address], [place], [place] [address]    Report Status 04/13/2022 FINAL  Final  Blood Culture (routine x 2)     Status: None   Collection Time: 04/08/22  8:02 PM   Specimen: BLOOD  Result Value Ref Range Status   Specimen Description BLOOD SITE NOT SPECIFIED  Final   Special Requests   Final    BOTTLES DRAWN AEROBIC ONLY Blood Culture adequate volume   Culture   Final    NO GROWTH 5 DAYS Performed at [facility], [place] [address] [place], [place] [address]    Report Status 04/13/2022 FINAL  Final  MRSA Next Gen by PCR, Nasal     Status: None  Collection Time: 04/08/22  8:19 PM   Specimen: Nasal Mucosa; Nasal Swab  Result Value Ref Range Status   MRSA by PCR Next Gen NOT DETECTED NOT DETECTED Final    Comment: (NOTE) The GeneXpert MRSA Assay (FDA approved for NASAL specimens only), is one component of a comprehensive MRSA colonization surveillance program. It is not intended to diagnose MRSA infection nor to guide or monitor treatment for MRSA infections. Test performance is not FDA approved in patients less than 3 years old. Performed at [facility], [place] [address] [place], [place] [address]      Labs: Basic Metabolic Panel: Recent Labs  Lab 04/08/22 1655 04/09/22 0313 04/10/22 0250 04/11/22 0332 04/12/22 0457  NA 133* 135 136 138 135  K 4.7 4.4 4.0 4.0 3.6  CL 99 101 106 102 100  CO2 25 25 24 24 27   GLUCOSE 194* 87 91 81 115*  BUN 25* 15 10 7* 10  CREATININE 1.23 0.80 0.83 0.86 0.86  CALCIUM 9.1 8.5* 8.1* 8.6* 8.3*  MG  --  2.0  --  1.9  --   PHOS  --  4.1  --   --   --    Liver Function Tests: Recent Labs  Lab 04/08/22 1655 04/09/22 0313  AST 10* 9*  ALT 32 29  ALKPHOS 51 42  BILITOT 0.7 0.7  PROT 5.9* 5.1*  ALBUMIN 2.9* 2.5*   No results for input(s): "LIPASE", "AMYLASE" in the last 168 hours. No results for input(s): "AMMONIA" in the last 168 hours. CBC: Recent Labs  Lab 04/08/22 1655 04/09/22 0313 04/09/22 1630 04/10/22 0250 04/11/22 0332 04/12/22 0457  WBC 16.6* 17.0* 17.4* 14.7* 8.2 9.7  NEUTROABS 15.3*  --  15.3* 12.2*  --   --   HGB 13.2 11.3* 12.0* 10.8* 10.9* 10.9*  HCT 39.7 33.8* 36.1* 31.7* 32.0* 32.7*  MCV 92.5 92.3 92.6 91.1 90.9 92.1  PLT 507* 471* 483* 440* 446* 443*   Cardiac Enzymes: No results for input(s): "CKTOTAL", "CKMB", "CKMBINDEX", "TROPONINI" in the last 168 hours. BNP: BNP (last 3 results) No results for input(s): "BNP" in the last 8760 hours.  ProBNP (last 3 results) No results for input(s): "PROBNP" in the last 8760 hours.  CBG: No results  for input(s): "GLUCAP" in the last 168 hours.  FURTHER DISCHARGE INSTRUCTIONS:   Get Medicines reviewed and adjusted: Please take all your medications with you for your next visit with your Primary MD   Laboratory/radiological data: Please request your Primary MD to go over all hospital tests and procedure/radiological results at the follow up, please ask your Primary MD to get all Hospital records sent to his/her office.   In some cases, they will be blood work, cultures and biopsy results pending at the time of your discharge. Please request that your primary care M.D. goes through all the records of your hospital data and follows up on these results.   Also Note the following: If you experience worsening of your admission symptoms, develop shortness of breath, life threatening emergency, suicidal or homicidal thoughts you must seek medical attention immediately by calling 911 or calling your MD immediately  if symptoms less severe.   You must read complete instructions/literature along with all the possible adverse reactions/side effects for all the Medicines you take and that have been prescribed to you. Take any new Medicines after you have completely understood and accpet all the possible adverse reactions/side effects.    Do not drive when taking Pain  medications or sleeping medications (Benzodaizepines)   Do not take more than prescribed Pain, Sleep and Anxiety Medications. It is not advisable to combine anxiety,sleep and pain medications without talking with your primary care practitioner   Special Instructions: If you have smoked or chewed Tobacco  in the last 2 yrs please stop smoking, stop any regular Alcohol  and or any Recreational drug use.   Wear Seat belts while driving.   Please note: You were cared for by a hospitalist during your hospital stay. Once you are discharged, your primary care physician will handle any further medical issues. Please note that NO REFILLS for any  discharge medications will be authorized once you are discharged, as it is imperative that you return to your primary care physician (or establish a relationship with a primary care physician if you do not have one) for your post hospital discharge needs so that they can reassess your need for medications and monitor your lab values.     Signed:  Florencia Reasons MD, PhD, FACP  [facility] 04/13/2022, 10:53 AM

## 2022-04-15 LAB — SURGICAL PATHOLOGY

## 2022-04-16 ENCOUNTER — Other Ambulatory Visit: Payer: Self-pay

## 2022-04-16 MED ORDER — METRONIDAZOLE 500 MG PO TABS: 500.0000 mg | ORAL_TABLET | Freq: Two times a day (BID) | ORAL | 0 refills | Status: AC

## 2022-04-16 MED ORDER — CLARITHROMYCIN 500 MG PO TABS: 500.0000 mg | ORAL_TABLET | Freq: Two times a day (BID) | ORAL | 0 refills | Status: AC

## 2022-04-16 MED ORDER — AMOXICILLIN 500 MG PO TABS: 1000.0000 mg | ORAL_TABLET | Freq: Two times a day (BID) | ORAL | 0 refills | Status: AC

## 2022-04-19 ENCOUNTER — Telehealth: Payer: Self-pay | Admitting: Gastroenterology

## 2022-04-19 NOTE — Telephone Encounter (Signed)
Returned call to [facility] facility and spoke with Philis Nettle, Midwife. She states that the pharmacy informed her that there is an interaction with Clarithromycin and Tamsulosin as well and patient would need to hold this for 14 days as well. I gave Philis Nettle a verbal order for patient to hold Tamsulosin during H. Pylori treatment.

## 2022-04-19 NOTE — Telephone Encounter (Signed)
Shannon grey called asking to get an order to hold the Flomax medication please call [phone]

## 2022-04-22 ENCOUNTER — Ambulatory Visit (INDEPENDENT_AMBULATORY_CARE_PROVIDER_SITE_OTHER): Payer: Medicare Other

## 2022-04-22 DIAGNOSIS — I639 Cerebral infarction, unspecified: Secondary | ICD-10-CM | POA: Diagnosis not present

## 2022-04-24 LAB — CUP PACEART REMOTE DEVICE CHECK
Date Time Interrogation Session: 20231020231443
Implantable Pulse Generator Implant Date: 20211206

## 2022-05-13 ENCOUNTER — Encounter: Payer: Self-pay | Admitting: *Deleted

## 2022-05-17 NOTE — Progress Notes (Unsigned)
05/20/2022 Adrian Rodgers [id number] 06-15-1941  Referring provider: Elsie Amis* Primary GI doctor: Dr. Adela Lank  ASSESSMENT AND PLAN:   Acute H. pylori gastric ulcer with hemorrhage Status post treatment CAP With Flagyl Continues on PPI twice daily, do this for 3 months then decrease to 1 tablet daily. We will plan for eradication study with Diaterix stool while on PPI Patient history of CVA left-sided weakness on Plavix, emphysema, discussed with daughter and patient about scheduling repeat endoscopy for evaluation of gastric ulcers however due to patient's comorbid conditions and being H. pylori positive with treatment, can plan on just testing for eradication study versus repeat EGD.  Patient and daughter agree with this plan, if any worsening symptoms or further hematemesis, melena can plan for repeat EGD.   Discussed avoiding NSAIDs  Other emphysema (HCC) History of CVA (cerebrovascular accident) Antiplatelet or antithrombotic long-term use   Patient Care Team: Ruthell Rummage, PA-C as PCP - General (Internal Medicine)  HISTORY OF PRESENT ILLNESS: 81 y.o. 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   Patient was in the hospital from 04/08/22 to 04/13/22, was in the hospital for anorexia, hypotension and mild confusion.  Had recent admission for COVID with hypoxic respiratory failure discharged on home oxygen. Found to have heavy positive stool, drifting hemoglobin to 11, BUN elevated on admission.  Intermittent scant rectal bleeding attributed to hemorrhoids over the years.  Remote colonoscopy.. 04/10/2022 endoscopy showed 3 ulcers noted in his stomach, 1 of which with stigmata for bleeding.  Treated endoscopically with Purastat with a  good response.  Discharged on Protonix 40 mg twice daily and Carafate.  Would plan for repeat endoscopy 3 to 4 months to confirm mucosal healing of ulcers.  Instructed to avoid NSAIDs. Pathology showed positive H. pylori gastritis, started on amoxicillin 1 g twice daily, clarithromycin 500 mg twice daily and Flagyl 500 mg twice daily  He has completed the therapy for his H pylori, finished about 2 weeks ago. Daughter is here and provides some of the history. He is still on protonix twice a day.  He denies NSAIDS, no ETOH.  Discharge HGB 10.9 and recheck with PCP 11/15 was 12.  He is not on an iron but has increase greens.  Patient denies GERD.  Patient denies dysphagia, nausea, vomiting, melena.  Patient has a BM every day, last one this AM. .  Patient denies change in bowel habits, constipation, diarrhea, hematochezia.  Has had some weight loss with COVID but staying steady at this time.  Still some DOE with moving, walks at home with cane but has weakness in left leg, currently in wheel chair for longer distances. Lives by himself and performs ADL.  Denies colon cancer history.    Wt Readings from Last 3 Encounters:  05/20/22 172 lb (78 kg)  04/12/22 176 lb 12.9 oz (80.2 kg)  03/25/22 180 lb 8 oz (81.9 kg)     He  reports that he has been smoking cigarettes. He has a 20.00 pack-year smoking history. He has never used smokeless tobacco. He reports current alcohol use of about 1.0 standard drink of alcohol per week. He reports that he does not use drugs.  Current Medications:    Current Outpatient Medications (Cardiovascular):    atorvastatin (LIPITOR) 80 MG tablet, Take 1 tablet (  80 mg total) by mouth daily.   losartan (COZAAR) 25 MG tablet, Take 1 tablet (25 mg total) by mouth daily.  Current Outpatient Medications (Respiratory):    albuterol (PROVENTIL HFA;VENTOLIN HFA) 108 (90 Base) MCG/ACT inhaler, Inhale 2 puffs into the lungs every 6 (six) hours as needed for wheezing or  shortness of breath. (Patient taking differently: Inhale 2 puffs into the lungs as needed for wheezing or shortness of breath.)   mometasone-formoterol (DULERA) 100-5 MCG/ACT AERO, Inhale 2 puffs into the lungs 2 (two) times daily.  Current Outpatient Medications (Analgesics):    acetaminophen (TYLENOL) 500 MG tablet, Take 1,000 mg by mouth as needed for moderate pain, mild pain, fever or headache.  Current Outpatient Medications (Hematological):    clopidogrel (PLAVIX) 75 MG tablet, Take 1 tablet by mouth daily.  Current Outpatient Medications (Other):    buPROPion (WELLBUTRIN XL) 150 MG 24 hr tablet, Take 150 mg by mouth daily.   Multiple Vitamins-Minerals (CENTRUM SILVER 50+MEN) TABS, Take 2 tablets by mouth daily with breakfast.   nicotine (NICODERM CQ - DOSED IN MG/24 HOURS) 14 mg/24hr patch, Place 14 mg onto the skin daily.   tamsulosin (FLOMAX) 0.4 MG CAPS capsule, Take 1 capsule (0.4 mg total) by mouth daily.   pantoprazole (PROTONIX) 40 MG tablet, Take 1 tablet (40 mg total) by mouth daily.   sucralfate (CARAFATE) 1 g tablet, Take 1 tablet (1 g total) by mouth 4 (four) times daily -  with meals and at bedtime for 7 days.  Medical History:  Past Medical History:  Diagnosis Date   Aortic atherosclerosis ([place])    Cerebral aneurysm    Community acquired pneumonia of right middle lobe of lung 09/20/2017   Emphysema of lung (HCC)    Esophagitis    Gastric ulcer    H. pylori infection    High cholesterol    Hypertension    Sepsis ([place]) 09/19/2017   Stroke ([place]) 05/01/2020   Tobacco dependence    Allergies: No Known Allergies   Surgical History:  He  has a past surgical history that includes implantable loop recorder placement (06/05/2020); Esophagogastroduodenoscopy (egd) with propofol (N/A, 04/10/2022); biopsy (04/10/2022); and Hemostasis control (04/10/2022). Family History:  His Family history is unknown by patient.  REVIEW OF SYSTEMS  : All other systems reviewed and  negative except where noted in the History of Present Illness.  PHYSICAL EXAM: BP 102/76   Pulse 84   Ht 5\' 8"  (1.727 m)   Wt 172 lb (78 kg)   BMI 26.15 kg/m  General: Elderly male in wheelchair Head:   Normocephalic and atraumatic. Eyes:  sclerae anicteric,conjunctive pink  Heart:   regular rate and rhythm Pulm:  Clear anteriorly; no wheezing Abdomen:   Soft, Obese AB, Active bowel sounds. No tenderness . Without guarding and Without rebound, No organomegaly appreciated. Rectal: Not evaluated Extremities:  Without edema. Msk: Symmetrical without gross deformities. Peripheral pulses intact.  Neurologic:  Alert and  oriented x4;  No focal deficits.  Skin:   Dry and intact without significant lesions or rashes. Psychiatric:  Cooperative. Normal mood and affect.    Vladimir Crofts, PA-C 11:28 AM

## 2022-05-20 ENCOUNTER — Telehealth: Payer: Self-pay

## 2022-05-20 ENCOUNTER — Encounter: Payer: Self-pay | Admitting: Physician Assistant

## 2022-05-20 ENCOUNTER — Ambulatory Visit: Payer: Medicare Other | Admitting: Physician Assistant

## 2022-05-20 VITALS — BP 102/76 | HR 84 | Ht 68.0 in | Wt 172.0 lb

## 2022-05-20 DIAGNOSIS — K279 Peptic ulcer, site unspecified, unspecified as acute or chronic, without hemorrhage or perforation: Secondary | ICD-10-CM

## 2022-05-20 DIAGNOSIS — J438 Other emphysema: Secondary | ICD-10-CM

## 2022-05-20 DIAGNOSIS — Z8673 Personal history of transient ischemic attack (TIA), and cerebral infarction without residual deficits: Secondary | ICD-10-CM

## 2022-05-20 DIAGNOSIS — Z7902 Long term (current) use of antithrombotics/antiplatelets: Secondary | ICD-10-CM

## 2022-05-20 DIAGNOSIS — B9681 Helicobacter pylori [H. pylori] as the cause of diseases classified elsewhere: Secondary | ICD-10-CM

## 2022-05-20 DIAGNOSIS — K250 Acute gastric ulcer with hemorrhage: Secondary | ICD-10-CM

## 2022-05-20 MED ORDER — PANTOPRAZOLE SODIUM 40 MG PO TBEC: 40.0000 mg | DELAYED_RELEASE_TABLET | Freq: Every day | ORAL | 3 refills | Status: DC

## 2022-05-20 NOTE — Patient Instructions (Signed)
We are ordering a "Diatherix" stool testing for you to evaluate you for a bacteria in your stomach called H pylori.  You have received a kit from our office today containing all necessary supplies to complete this test.  Please carefully read the stool collection instructions provided in the kit before opening the accompanying materials.   Important to remember: -Place the label from the top left corner of the laboratory request sheet onto the "puritan opti-swab" TUBE. -This label should include your full name and date of birth.  -After completing the test, you should secure the tube into the specimen biohazard bag.  -The laboratory request information sheet (including date and time of specimen collection) should be placed into the outside pocket of the specimen biohazard bag and returned to the [facility] lab with 2 days of collection.   If it is greater than two days from collection you will be asked to repeat the test.  If the label is missing from the tube with your name and date of birth on it, you will have to repeat the test.  Any questions please message [place] on my chart or call the office at [phone]  Please take your proton pump inhibitor medication, pantoprazole twice a day for 3 months, then go down to once a day.   Please take this medication 30 minutes to 1 hour before meals- this makes it more effective.  Avoid spicy and acidic foods Avoid fatty foods Limit your intake of coffee, tea, alcohol, and carbonated drinks Work to maintain a healthy weight Keep the head of the bed elevated at least 3 inches with blocks or a wedge pillow if you are having any nighttime symptoms Stay upright for 2 hours after eating Avoid meals and snacks three to four hours before bedtime

## 2022-05-20 NOTE — Progress Notes (Signed)
Agree with assessment as outlined. Agree with H. pylori eradication testing. Ideally would recommend a repeat EGD for this patient given he had a very high risk ulcer in his stomach.  We could do this while on Plavix, however he is certainly higher than average risk for endoscopy, especially if on supplemental oxygen, must weigh risks vs benefits.  Marchelle Folks, if he does not want to proceed with EGD now, I would like to see him back in the office in 3 to 4 months or so for reassessment and see how he is doing from a respiratory standpoint at that time, to have another discussion with him about possible EGD.  He should take PPI indefinitely at this point if he is not going to do the EGD. Thanks

## 2022-05-20 NOTE — Telephone Encounter (Signed)
Previous note (Error). Spoke with patient regarding scheduling & he asked that I schedule with his daughter. Left message for daughter to call back.

## 2022-05-20 NOTE — Telephone Encounter (Signed)
OV 3-4 months with Dr. Adela Lank per Marchelle Folks, [place]. OV scheduled with patient for

## 2022-05-21 NOTE — Telephone Encounter (Signed)
OV scheduled with patient's daughter for 08/14/21 at 3:00 pm with Dr. Adela Lank.

## 2022-05-22 NOTE — Progress Notes (Signed)
[facility] Summary Report / Loop Recorder 

## 2022-05-27 ENCOUNTER — Ambulatory Visit (INDEPENDENT_AMBULATORY_CARE_PROVIDER_SITE_OTHER): Payer: Medicare Other

## 2022-05-27 DIAGNOSIS — I639 Cerebral infarction, unspecified: Secondary | ICD-10-CM

## 2022-05-28 LAB — CUP PACEART REMOTE DEVICE CHECK
Date Time Interrogation Session: 20231126233747
Implantable Pulse Generator Implant Date: 20211206

## 2022-06-10 NOTE — Progress Notes (Unsigned)
[facility] [address] [place]. [place] [id number] [phone]       STROKE FOLLOW UP NOTE  [name] Date of Birth:  Jun 10, 1941 Medical Record Number:  [id number]   Reason for Referral: stroke follow up    SUBJECTIVE:   CHIEF COMPLAINT:  No chief complaint on file.   HPI:   Update 06/11/2022 JM: Patient returns for 1212-month stroke follow-up.  Overall stable without new stroke/TIA symptoms.  Chronic gait impairment stable.  Compliant on Plavix and atorvastatin Blood pressure well-controlled Loop recorder has not shown atrial fibrillation thus far Continued tobacco use ***     History provided for reference purposes only Update 12/04/2021 JM: Patient returns for stroke follow-up after prior visit 7 months ago.  He is accompanied by his daughter.  Reports episode 2 to 3 weeks ago of left leg weakness.  Reports sitting at a basketball game on the bleachers for over 1 hour and upon standing, his left leg gave out on him, he noted some weakness in LLE (like his leg would not support him) and numbness in left leg for about 1 hour prior to completely subsiding.  He had no other associated symptoms or deficits as daughter thoroughly assessed him. He does have gait impairment post stroke but also notes chronic low back pain affecting left side. Daughter notes gait hasn't been as steady but this is something that has been gradually occurring, denies acute onset.  She questions additional PT.  He is relatively sedentary with limited physical activity.  Use of cane for ambulation, denies any falls.  He otherwise denies any new stroke/TIA symptoms.  Continues to maintain ADLs independently as well as driving without difficulty.  Remains on Plavix and atorvastatin, denies side effects.  Blood pressure today 138/90. Does not routinely monitor at home.  Continued tobacco use 3-4 cigarettes per day, trying to decrease amount, currently on bupropion managed by PCP.   Loop recorder has not shown atrial fibrillation thus far.  Routinely followed by PCP.  No further concerns at this time  Initial visit 05/14/2021 JM: Being seen today for hospital follow up accompanied by his daughter.  Previously seen via MyChart video visit on 06/13/2020 but lost to follow up. Doing well since discharge. Back to prior level of functioning. Denies new stroke/TIA symptoms. Lives alone - does ADLs independently. Family will help with some IADLs as needed. Was driving prior to recent TIA but has not yet returned to driving - daughter concerned regarding him returning back to driving due to concern of additional TIA. Per daughter, PCP advised that they will need neuro clearance prior to returning back to driving. Completed DAPT - remains on Plavix alone without side effects. Remains on atorvastatin 80mg  daily - denies side effects. Blood pressure today 142/88 - does not routinely monitor at home.  Loop recorder has not shown atrial fibrillation thus far.  No further concerns at this time  Stroke admission 03/25/2021 [name] is a 81 y.o. 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  Personally reviewed hospitalization pertinent progress notes, lab work and imaging.  Evaluated by Dr. Pearlean BrownieSethi for likely right hemispheric TIA secondary to small vessel disease.  MRI negative acute infarct with small remote lacunar infarcts in bilateral cerebellar hemispheres.  CTA head/neck negative LVO.  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  EF 70 to 75%.  LDL 83.  A1c 6.2.  Recommended DAPT for 3 weeks and Plavix alone.  Loop recorder interrogated -no evidence of A. fib.  Therapy eval's without therapy needs and discharged home on 03/27/2021.  Return to ER same day for recurrent left-sided  weakness and facial droop. EEG no evidence of epileptiform discharges and LTM EEG unremarkable. Eval by neuro with likely recurrent TIA and repeat work up not indicated.  Discharged back home on 03/30/2021.         PERTINENT IMAGING   CT head No acute abnormality.  CTA head : No LVO CTA  neck :1. The bilateral common carotid and internal carotid arteries are patent within the neck without stenosis. Minimal soft plaque within the proximal right ICA. Tortuosity of both cervical internal carotid arteries. 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 2. Vertebral arteries patent within the neck without stenosis. Tortuosity of both vessels. 3. 9 mm region of ground-glass opacity in the left lung apex, stable as compared to the CTA head/neck of 05/13/2020 MRI  no acute infarct or hemorrhage. Small remote lacunar infarcts in bilateral cerebellar hemispheres. 2D Echo EF 70-75% LDL 83 HgbA1c 6.2   ROS:   14 system review of systems performed and negative with exception of those listed in HPI  PMH:  Past Medical History:  Diagnosis Date   Aortic atherosclerosis (HCC)    Cerebral aneurysm    Community acquired pneumonia of right middle lobe of lung 09/20/2017   Emphysema of lung (HCC)    Esophagitis    Gastric ulcer    H. pylori infection    High cholesterol    Hypertension    Sepsis (HCC) 09/19/2017   Stroke (HCC) 05/01/2020   Tobacco dependence     PSH:  Past Surgical History:  Procedure Laterality Date   BIOPSY  04/10/2022   Procedure: BIOPSY;  Surgeon: Benancio Deeds, MD;  Location: [facility];  Service: Gastroenterology;;   ESOPHAGOGASTRODUODENOSCOPY (EGD) WITH PROPOFOL N/A 04/10/2022   Procedure: ESOPHAGOGASTRODUODENOSCOPY (EGD) WITH PROPOFOL;  Surgeon: Benancio Deeds, MD;  Location: [facility];  Service: Gastroenterology;  Laterality: N/A;    HEMOSTASIS CONTROL  04/10/2022   Procedure: HEMOSTASIS CONTROL;  Surgeon: Benancio Deeds, MD;  Location: [facility] ENDOSCOPY;  Service: Gastroenterology;;  purastat    implantable loop recorder placement  06/05/2020   Medtronic Reveal [place] model [id number]) implantable loop recorder     Social History:  Social History   Socioeconomic History   Marital status: Divorced    Spouse name: Not on file   Number of children: Not on file   Years of education: Not on file   Highest education level: Not on file  Occupational History   Occupation: retired  Tobacco Use   Smoking status: Every Day    Packs/day: 1.00    Years: 20.00    Total pack years: 20.00    Types: Cigarettes   Smokeless tobacco: Never  Vaping Use   Vaping Use: Never used  Substance and Sexual Activity   Alcohol use: Yes    Alcohol/week: 1.0 standard drink of alcohol    Types: 1 Cans of beer per week    Comment: remote h/o heavy use   Drug use: Never   Sexual activity: Not on file  Other Topics Concern   Not on file  Social History  Narrative   Not on file   Social Determinants of Health   Financial Resource Strain: Not on file  Food Insecurity: No Food Insecurity (04/09/2022)   Hunger Vital Sign    Worried About Running Out of Food in the Last Year: Never true    Ran Out of Food in the Last Year: Never true  Transportation Needs: No Transportation Needs (04/09/2022)   PRAPARE - Administrator, Civil Service (Medical): No    Lack of Transportation (Non-Medical): No  Physical Activity: Not on file  Stress: Not on file  Social Connections: Not on file  Intimate Partner Violence: Not At Risk (04/09/2022)   Humiliation, Afraid, Rape, and Kick questionnaire    Fear of Current or Ex-Partner: No    Emotionally Abused: No    Physically Abused: No    Sexually Abused: No    Family History:  Family History  Family history unknown: Yes    Medications:   Current Outpatient Medications on  File Prior to Visit  Medication Sig Dispense Refill   acetaminophen (TYLENOL) 500 MG tablet Take 1,000 mg by mouth as needed for moderate pain, mild pain, fever or headache.     albuterol (PROVENTIL HFA;VENTOLIN HFA) 108 (90 Base) MCG/ACT inhaler Inhale 2 puffs into the lungs every 6 (six) hours as needed for wheezing or shortness of breath. (Patient taking differently: Inhale 2 puffs into the lungs as needed for wheezing or shortness of breath.) 1 Inhaler 2   atorvastatin (LIPITOR) 80 MG tablet Take 1 tablet (80 mg total) by mouth daily.     buPROPion (WELLBUTRIN XL) 150 MG 24 hr tablet Take 150 mg by mouth daily.     clopidogrel (PLAVIX) 75 MG tablet Take 1 tablet by mouth daily.     losartan (COZAAR) 25 MG tablet Take 1 tablet (25 mg total) by mouth daily. 30 tablet 0   mometasone-formoterol (DULERA) 100-5 MCG/ACT AERO Inhale 2 puffs into the lungs 2 (two) times daily. 1 each 2   Multiple Vitamins-Minerals (CENTRUM SILVER 50+MEN) TABS Take 2 tablets by mouth daily with breakfast.     nicotine (NICODERM CQ - DOSED IN MG/24 HOURS) 14 mg/24hr patch Place 14 mg onto the skin daily.     pantoprazole (PROTONIX) 40 MG tablet Take 1 tablet (40 mg total) by mouth daily. 90 tablet 3   sucralfate (CARAFATE) 1 g tablet Take 1 tablet (1 g total) by mouth 4 (four) times daily -  with meals and at bedtime for 7 days. 12 tablet 0   tamsulosin (FLOMAX) 0.4 MG CAPS capsule Take 1 capsule (0.4 mg total) by mouth daily. 30 capsule 0   No current facility-administered medications on file prior to visit.    Allergies:  No Known Allergies    OBJECTIVE:  Physical Exam  There were no vitals filed for this visit.   There is no height or weight on file to calculate BMI. No results found.   General: well developed, well nourished, very pleasant elderly African-American male, seated, in no evident distress Head: head normocephalic and atraumatic.   Neck: supple with no carotid or supraclavicular  bruits Cardiovascular: regular rate and rhythm, no murmurs Musculoskeletal: no deformity Skin:  no rash/petichiae Vascular:  Normal pulses all extremities   Neurologic Exam Mental Status: Awake and fully alert.  Fluent speech and language.  Oriented to place and time. Recent and remote memory intact. Attention span, concentration and fund of knowledge appropriate. Mood and affect appropriate.  Cranial Nerves:  Pupils equal, briskly reactive to light. Extraocular movements full without nystagmus. Visual fields full to confrontation. Hearing intact. Facial sensation intact. Face, tongue, palate moves normally and symmetrically.  Motor: Normal bulk and tone. Normal strength in all tested extremity muscles Sensory.: intact to touch , pinprick , position and vibratory sensation.  Coordination: Rapid alternating movements normal in all extremities. Finger-to-nose and heel-to-shin performed accurately bilaterally. Gait and Station: Arises from chair without difficulty. Stance is slightly hunched. Gait demonstrates slightly decreased stride length and step height of LLE which was seen on prior visit exam, mild unsteadiness, use of cane.  Tandem walking heel toe not attempted Reflexes: 1+ and symmetric. Toes downgoing.         ASSESSMENT: Laken Lobato is a 81 y.o. 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 Vascular risk factors include HTN, HLD, prior EtOH use, tobacco use, diffuse intracranial stenosis, cerebral aneurysm and advanced age.  Reports transient episode of left leg weakness mid-May without any other associated symptoms/deficits     PLAN:  Transient left leg weakness: Discussed possibility of TIA vs stroke although low suspicion due to duration of deficits, stable neurological exam today and no other associated symptoms.  He declines interest in obtaining  MR brain which I feel is reasonable as he is back to baseline, episode occurred 2 to 3 weeks ago and current treatment plan would not change. Also question possibility of chronic lower back pain being a contributing factor.  Referral placed for home health PT which is appropriate as patient cannot ambulate long distance nor drives long distance without assistance.  Encouraged follow-up with PCP for further back evaluation if indicated.  Discussed importance of proceeding to ED immediately with any acute onset stroke/TIA symptoms TIA x2 R ACA stroke, cryptogenic Continue clopidogrel 75 mg daily  and atorvastatin 80 mg daily for secondary stroke prevention.   Loop recorder has not shown atrial fibrillation thus far -monitored by cardiology Discussed secondary stroke prevention measures and importance of close PCP follow up for aggressive stroke risk factor management including BP goal<130/90, HLD with LDL goal<70 and pre-DM with A1c.<7 .  Stroke labs 08/2021: LDL 55, A1c 6.3 Discussed importance of complete tobacco cessation as continued use greatly increases risk of additional strokes as well as increase risk of aneurysm rupture.  He verbalizes understanding and is trying to decrease daily amount Cerebral aneurysm:  Repeat CTA around 03/2022 for surveillance monitoring - not yet completed. Provided GI contact information to call and schedule *** CTA 03/2021- unchanged 5-7mm mid cervical left ICA. Plan to repeat imaging in 1 year for surveillance monitoring CTA 05/2020 left carotid 5 to 6 mm widemouth aneurysm or pseudoaneurysm     Follow up in 6 months or call earlier if needed   CC:  PCP: Ruthell Rummage, PA-C    I spent 37 minutes of face-to-face and non-face-to-face time with patient and daughter.  This included previsit chart review, lab review, study review, electronic health record documentation, patient and daughter education regarding prior stroke with residual deficits, transient LLE  weakness and possible etiologies, secondary stroke prevention measures and importance of managing stroke risk factors and answered all other questions to patient and daughters satisfaction   Ihor Austin, AGNP-BC  [facility] [address] [place], [place] [address]  Phone [phone] Fax [phone] Note: This document was prepared with digital dictation and possible smart  Company secretary. Any transcriptional errors that result from this process are unintentional.

## 2022-06-11 ENCOUNTER — Encounter: Payer: Self-pay | Admitting: Adult Health

## 2022-06-11 ENCOUNTER — Ambulatory Visit: Payer: Medicare Other | Admitting: Adult Health

## 2022-06-11 VITALS — BP 132/86 | HR 85 | Ht 72.0 in | Wt 182.2 lb

## 2022-06-11 DIAGNOSIS — G459 Transient cerebral ischemic attack, unspecified: Secondary | ICD-10-CM

## 2022-06-11 DIAGNOSIS — I671 Cerebral aneurysm, nonruptured: Secondary | ICD-10-CM

## 2022-06-11 DIAGNOSIS — E538 Deficiency of other specified B group vitamins: Secondary | ICD-10-CM | POA: Diagnosis not present

## 2022-06-11 DIAGNOSIS — R200 Anesthesia of skin: Secondary | ICD-10-CM

## 2022-06-11 DIAGNOSIS — I639 Cerebral infarction, unspecified: Secondary | ICD-10-CM | POA: Diagnosis not present

## 2022-06-11 NOTE — Patient Instructions (Addendum)
Please call [facility] to schedule follow up imaging for monitoring of cerebral aneurysm - can call [phone]  We will check B12 level today - if low, will recommend starting a B12 supplement  If leg symptoms worsen, please let me know   Continue clopidogrel 75 mg daily  and atorvastatin  for secondary stroke prevention  Continue to follow up with PCP regarding cholesterol and blood pressure management  Maintain strict control of hypertension with blood pressure goal below 130/90 and cholesterol with LDL cholesterol (bad cholesterol) goal below 70 mg/dL.   Signs of a Stroke? Follow the BEFAST method:  Balance Watch for a sudden loss of balance, trouble with coordination or vertigo Eyes Is there a sudden loss of vision in one or both eyes? Or double vision?  Face: Ask the person to smile. Does one side of the face droop or is it numb?  Arms: Ask the person to raise both arms. Does one arm drift downward? Is there weakness or numbness of a leg? Speech: Ask the person to repeat a simple phrase. Does the speech sound slurred/strange? Is the person confused ? Time: If you observe any of these signs, call [phone].       Thank you for coming to see [place] at [facility]. I hope we have been able to provide you high quality care today.  You may receive a patient satisfaction survey over the next few weeks. We would appreciate your feedback and comments so that we may continue to improve ourselves and the health of our patients.

## 2022-06-12 LAB — VITAMIN B12: Vitamin B-12: 873 pg/mL (ref 232–1245)

## 2022-07-02 ENCOUNTER — Ambulatory Visit (INDEPENDENT_AMBULATORY_CARE_PROVIDER_SITE_OTHER): Payer: Medicare Other

## 2022-07-02 DIAGNOSIS — I639 Cerebral infarction, unspecified: Secondary | ICD-10-CM

## 2022-07-02 LAB — CUP PACEART REMOTE DEVICE CHECK
Date Time Interrogation Session: 20240101231955
Implantable Pulse Generator Implant Date: 20211206

## 2022-07-09 NOTE — Progress Notes (Signed)
[facility] Summary Report / Loop Recorder 

## 2022-08-04 LAB — CUP PACEART REMOTE DEVICE CHECK
Date Time Interrogation Session: 20240203232533
Implantable Pulse Generator Implant Date: 20211206

## 2022-08-05 ENCOUNTER — Ambulatory Visit: Payer: Medicare Other

## 2022-08-05 DIAGNOSIS — I639 Cerebral infarction, unspecified: Secondary | ICD-10-CM | POA: Diagnosis not present

## 2022-08-06 NOTE — Progress Notes (Signed)
[facility] Summary Report / Loop Recorder 

## 2022-08-14 ENCOUNTER — Ambulatory Visit: Payer: Medicare Other | Admitting: Gastroenterology

## 2022-08-29 ENCOUNTER — Telehealth: Payer: Self-pay

## 2022-08-29 NOTE — Telephone Encounter (Signed)
LINQ alert received for tachy, no EGM's available Route to triage for manual transmission LA ILR implant for cryptogenic stroke.  Reviewed patient's manual transmission sent in today.  Event was a 3 minute episode of what appears likely SVT, patient has history.  He was not symptomatic and has not had any missed doses of medication.  No changes in diet or health.

## 2022-09-06 LAB — CUP PACEART REMOTE DEVICE CHECK
Date Time Interrogation Session: 20240307232423
Implantable Pulse Generator Implant Date: 20211206

## 2022-09-09 ENCOUNTER — Ambulatory Visit (INDEPENDENT_AMBULATORY_CARE_PROVIDER_SITE_OTHER): Payer: Medicare Other

## 2022-09-09 DIAGNOSIS — I639 Cerebral infarction, unspecified: Secondary | ICD-10-CM | POA: Diagnosis not present

## 2022-09-23 NOTE — Progress Notes (Signed)
[facility] Summary Report / Loop Recorder 

## 2022-10-10 LAB — CUP PACEART REMOTE DEVICE CHECK
Date Time Interrogation Session: 20240410002428
Implantable Pulse Generator Implant Date: 20211206

## 2022-10-14 ENCOUNTER — Ambulatory Visit (INDEPENDENT_AMBULATORY_CARE_PROVIDER_SITE_OTHER): Payer: Medicare Other

## 2022-10-14 DIAGNOSIS — I639 Cerebral infarction, unspecified: Secondary | ICD-10-CM | POA: Diagnosis not present

## 2022-10-22 NOTE — Progress Notes (Signed)
[facility] Summary Report / Loop Recorder 

## 2022-10-29 ENCOUNTER — Ambulatory Visit: Payer: Medicare Other | Admitting: Gastroenterology

## 2022-10-29 ENCOUNTER — Encounter: Payer: Self-pay | Admitting: Gastroenterology

## 2022-10-29 ENCOUNTER — Other Ambulatory Visit (INDEPENDENT_AMBULATORY_CARE_PROVIDER_SITE_OTHER): Payer: Medicare Other

## 2022-10-29 VITALS — BP 124/82 | HR 44 | Ht 68.0 in | Wt 199.0 lb

## 2022-10-29 DIAGNOSIS — Z7902 Long term (current) use of antithrombotics/antiplatelets: Secondary | ICD-10-CM

## 2022-10-29 DIAGNOSIS — Z9981 Dependence on supplemental oxygen: Secondary | ICD-10-CM

## 2022-10-29 DIAGNOSIS — D649 Anemia, unspecified: Secondary | ICD-10-CM

## 2022-10-29 DIAGNOSIS — K250 Acute gastric ulcer with hemorrhage: Secondary | ICD-10-CM | POA: Diagnosis not present

## 2022-10-29 DIAGNOSIS — Z8619 Personal history of other infectious and parasitic diseases: Secondary | ICD-10-CM

## 2022-10-29 LAB — CBC WITH DIFFERENTIAL/PLATELET
Basophils Absolute: 0 10*3/uL (ref 0.0–0.1)
Basophils Relative: 0.8 % (ref 0.0–3.0)
Eosinophils Absolute: 0.2 10*3/uL (ref 0.0–0.7)
Eosinophils Relative: 4.2 % (ref 0.0–5.0)
HCT: 40.5 % (ref 39.0–52.0)
Hemoglobin: 13.5 g/dL (ref 13.0–17.0)
Lymphocytes Relative: 24.9 % (ref 12.0–46.0)
Lymphs Abs: 1.3 10*3/uL (ref 0.7–4.0)
MCHC: 33.3 g/dL (ref 30.0–36.0)
MCV: 91 fl (ref 78.0–100.0)
Monocytes Absolute: 0.4 10*3/uL (ref 0.1–1.0)
Monocytes Relative: 8.6 % (ref 3.0–12.0)
Neutro Abs: 3.2 10*3/uL (ref 1.4–7.7)
Neutrophils Relative %: 61.5 % (ref 43.0–77.0)
Platelets: 352 10*3/uL (ref 150.0–400.0)
RBC: 4.45 Mil/uL (ref 4.22–5.81)
RDW: 17.1 % — ABNORMAL HIGH (ref 11.5–15.5)
WBC: 5.2 10*3/uL (ref 4.0–10.5)

## 2022-10-29 LAB — IBC + FERRITIN
Ferritin: 17.9 ng/mL — ABNORMAL LOW (ref 22.0–322.0)
Iron: 53 ug/dL (ref 42–165)
Saturation Ratios: 15 % — ABNORMAL LOW (ref 20.0–50.0)
TIBC: 352.8 ug/dL (ref 250.0–450.0)
Transferrin: 252 mg/dL (ref 212.0–360.0)

## 2022-10-29 MED ORDER — PANTOPRAZOLE SODIUM 20 MG PO TBEC: 20.0000 mg | DELAYED_RELEASE_TABLET | Freq: Every day | ORAL | 6 refills | Status: DC

## 2022-10-29 NOTE — Progress Notes (Signed)
HPI :  82 year old male accompanied by family today, here for a follow-up visit.  He was last seen in November 2023.  Recall he was admitted to the hospital in October 2023, had recent admission for COVID with hypoxic respiratory failure, on supplemental oxygen.  He was admitted with hypotension and mild confusion.  He had heme positive stool with a downtrending hemoglobin.  I performed an upper endoscopy which showed multiple gastric ulcers, 1 of which had stigmata for bleeding with an adherent clot, treated endoscopically with Puristat with a good response.  Biopsies were taken and positive for H. pylori which was the likely cause of his ulcers.  He did not endorse NSAID use at the time.  He was treated with a combination of amoxicillin, clarithromycin, and Flagyl along with PPI twice daily.  We had recommended follow-up H. pylori eradication testing with a stool test but he has not had that done yet.  He has remained on Protonix 40 mg once daily.  He denies any problems with reflux or abdominal pains.  He has not had any recurrent bleeding symptoms that bother him. He continues to avoid NSAIDs.  He remains on Plavix, has not had CBC checked since last December when his hemoglobin was in the 12's.  No recent iron testing.  He is eating well, has gained weight since the hospitalization.  He denies any pains in his abdomen.  He has supplemental oxygen use, has used it as needed and not routinely.  He is not wearing it today.  We discussed options together and if he wanted to pursue surveillance/follow-up endoscopy in light of his gastric ulcers.  Remote colonoscopy.  EF 04/09/22 - EF 70-75%, grade I DD    Past Medical History:  Diagnosis Date   Aortic atherosclerosis (HCC)    Cerebral aneurysm    Community acquired pneumonia of right middle lobe of lung 09/20/2017   Emphysema of lung (HCC)    Esophagitis    Gastric ulcer    H. 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)    Stroke (HCC) 05/01/2020   Tobacco dependence      Past Surgical History:  Procedure Laterality Date   BIOPSY  04/10/2022   Procedure: BIOPSY;  Surgeon: Benancio Deeds, MD;  Location: [facility];  Service: Gastroenterology;;   ESOPHAGOGASTRODUODENOSCOPY (EGD) WITH PROPOFOL N/A 04/10/2022   Procedure: ESOPHAGOGASTRODUODENOSCOPY (EGD) WITH PROPOFOL;  Surgeon: Benancio Deeds, MD;  Location: [facility] ENDOSCOPY;  Service: Gastroenterology;  Laterality: N/A;   HEMOSTASIS CONTROL  04/10/2022   Procedure: HEMOSTASIS CONTROL;  Surgeon: Benancio Deeds, MD;  Location: [facility] ENDOSCOPY;  Service: Gastroenterology;;  purastat    implantable loop recorder placement  06/05/2020   Medtronic Reveal [place] model [id number]) implantable loop recorder    Family History  Family history unknown: Yes   Social History   Tobacco Use   Smoking status: Every Day    Packs/day: 1.00    Years: 20.00    Additional pack years: 0.00    Total pack years: 20.00    Types: Cigarettes   Smokeless tobacco: Never  Vaping Use   Vaping Use: Never used  Substance Use Topics   Alcohol use: Yes    Alcohol/week: 1.0 standard drink of alcohol    Types: 1 Cans of beer per week    Comment: remote h/o heavy use   Drug  use: Never   Current Outpatient Medications  Medication Sig Dispense Refill   acetaminophen (TYLENOL) 500 MG tablet Take 1,000 mg by mouth as needed for moderate pain, mild pain, fever or headache.     albuterol (PROVENTIL HFA;VENTOLIN HFA) 108 (90 Base) MCG/ACT inhaler Inhale 2 puffs into the lungs every 6 (six) hours as needed for wheezing or shortness of breath. (Patient taking differently: Inhale 2 puffs into the lungs as needed for wheezing or shortness of breath.) 1 Inhaler 2   atorvastatin (LIPITOR) 80 MG tablet Take 1 tablet (80 mg total) by mouth daily.     BREZTRI AEROSPHERE 160-9-4.8 MCG/ACT AERO  Inhale 2 puffs into the lungs 2 (two) times daily.     clopidogrel (PLAVIX) 75 MG tablet Take 1 tablet by mouth daily.     losartan (COZAAR) 25 MG tablet Take 1 tablet (25 mg total) by mouth daily. 30 tablet 0   Multiple Vitamins-Minerals (CENTRUM SILVER 50+MEN) TABS Take 2 tablets by mouth daily with breakfast.     nicotine (NICODERM CQ - DOSED IN MG/24 HOURS) 14 mg/24hr patch Place 14 mg onto the skin daily.     pantoprazole (PROTONIX) 40 MG tablet Take 1 tablet (40 mg total) by mouth daily. 90 tablet 3   tamsulosin (FLOMAX) 0.4 MG CAPS capsule Take 1 capsule (0.4 mg total) by mouth daily. 30 capsule 0   No current facility-administered medications for this visit.   No Known Allergies   Review of Systems: All systems reviewed and negative except where noted in HPI.    Physical Exam: BP 124/82   Pulse (!) 44   Ht 5\' 8"  (1.727 m)   Wt 199 lb (90.3 kg)   BMI 30.26 kg/m  Constitutional: Pleasant,well-developed, male in no acute distress. Neurological: Alert and oriented to person place and time. Psychiatric: Normal mood and affect. Behavior is normal.   ASSESSMENT: 82 y.o. male here for assessment of the following  1. Acute gastric ulcer with hemorrhage   2. Antiplatelet or antithrombotic long-term use   3. History of Helicobacter pylori infection   4. Anemia, unspecified type   5. Dependence on supplemental oxygen    Patient has recovered clinically quite well from prior GI bleeding due to gastric ulcer.  Initially treated with Purastat and IV PPI.  Treated for H. pylori.  He is in need for H. pylori eradication testing and we discussed options for doing so.  We also spent some time discussing whether or not he wanted to have a surveillance endoscopy and long-term management of PPI.  They understand reasoning for pursuing surveillance endoscopy.  He is clinically feeling well.  Given his supplemental oxygen use he is at higher than average risk for routine endoscopy/anesthesia,  his case would need to be done at the hospital for anesthesia support if they want to pursue that.  Following discussion of this they want to hold off on surveillance endoscopy given his comorbidities and oxygen dependence.  However, we will check his labs today to make sure his hemoglobin stable and no iron deficiency.  If he has worsening anemia or iron deficient he may reconsider doing an endoscopy.  If we are holding off on endoscopy, he will submit H. pylori stool swab/Diatherix test for eradication testing on PPI.  Hopefully that is negative, if positive he will need retreatment.  Otherwise discussed long-term risks of chronic PPI use.  As long as he is on Plavix with his history of gastric ulcers, would recommend maintenance PPI therapy.  Long-term want to use the lowest dose needed for this purpose, I think reducing to 20 mg once daily at this point in time is reasonable if he is asymptomatic.  He agrees with this.  Will continue to avoid NSAIDs.  PLAN: - discussed long term risks / benefits of chronic PPI. Reduce protonix to 20mg  / daily - he will cut his 40mg  tab in half, and then further scripts use 20mg . - H pylori diatherix stool test for eradication testing - lab today for CBC and TIBC / ferritin - discussed EGD - he wants to hold off for now given comorbidities and oxygen dependence if his Hgb is stable and feeling well, risks may outweight benefits - avoid NSAIDs  Harlin Rain, MD [facility]

## 2022-10-29 NOTE — Patient Instructions (Addendum)
Your provider has ordered "Diatherix" stool testing for you. You have received a kit from our office today containing all necessary supplies to complete this test. Please carefully read the stool collection instructions provided in the kit before opening the accompanying materials. In addition, be sure to place the label from the top left corner of the laboratory request sheet onto the "puritan opti-swab" tube that is supplied in the kit. This label should include your full name and date of birth. After completing the test, you should secure the purtian tube into the specimen biohazard bag. The laboratory request information sheet (including date and time of specimen collection) should be placed into the outside pocket of the specimen biohazard bag and returned to the [facility] lab with 2 days of collection.    Please go to the lab in the basement of our building to have lab work done as you leave today. Hit "B" for basement when you get on the elevator.  When the doors open the lab is on your left.  We will call you with the results. Thank you.   Decrease Protonix to 20 mg to once daily  Thank you for entrusting me with your care and for choosing [facility], Dr. Ileene Patrick     If your blood pressure at your visit was 140/90 or greater, please contact your primary care physician to follow up on this. ______________________________________________________  If you are age 32 or older, your body mass index should be between 23-30. Your Body mass index is 30.26 kg/m. If this is out of the aforementioned range listed, please consider follow up with your Primary Care Provider.  If you are age 71 or younger, your body mass index should be between 19-25. Your Body mass index is 30.26 kg/m. If this is out of the aformentioned range listed, please consider follow up with your Primary Care Provider.  ________________________________________________________  The [facility] providers would like  to encourage you to use [facility] to communicate with providers for non-urgent requests or questions.  Due to long hold times on the telephone, sending your provider a message by [facility] may be a faster and more efficient way to get a response.  Please allow 48 business hours for a response.  Please remember that this is for non-urgent requests.  _______________________________________________________  Due to recent changes in healthcare laws, you may see the results of your imaging and laboratory studies on MyChart before your provider has had a chance to review them.  We understand that in some cases there may be results that are confusing or concerning to you. Not all laboratory results come back in the same time frame and the provider may be waiting for multiple results in order to interpret others.  Please give [place] 48 hours in order for your provider to thoroughly review all the results before contacting the office for clarification of your results. ;

## 2022-10-30 ENCOUNTER — Other Ambulatory Visit: Payer: Self-pay

## 2022-10-30 DIAGNOSIS — K250 Acute gastric ulcer with hemorrhage: Secondary | ICD-10-CM

## 2022-10-30 DIAGNOSIS — Z9981 Dependence on supplemental oxygen: Secondary | ICD-10-CM

## 2022-10-30 DIAGNOSIS — Z7902 Long term (current) use of antithrombotics/antiplatelets: Secondary | ICD-10-CM

## 2022-10-30 DIAGNOSIS — Z8619 Personal history of other infectious and parasitic diseases: Secondary | ICD-10-CM

## 2022-10-30 DIAGNOSIS — D649 Anemia, unspecified: Secondary | ICD-10-CM

## 2022-10-30 DIAGNOSIS — Z8673 Personal history of transient ischemic attack (TIA), and cerebral infarction without residual deficits: Secondary | ICD-10-CM

## 2022-10-30 MED ORDER — FERROUS SULFATE 325 (65 FE) MG PO TBEC: 325.0000 mg | DELAYED_RELEASE_TABLET | Freq: Every day | ORAL | 0 refills | Status: DC

## 2022-10-30 NOTE — Addendum Note (Signed)
Addended by: Missy Sabins on: 10/30/2022 03:20 PM   Modules accepted: Orders

## 2022-11-14 LAB — CUP PACEART REMOTE DEVICE CHECK
Date Time Interrogation Session: 20240513002731
Implantable Pulse Generator Implant Date: 20211206

## 2022-11-18 ENCOUNTER — Ambulatory Visit (INDEPENDENT_AMBULATORY_CARE_PROVIDER_SITE_OTHER): Payer: Medicare Other

## 2022-11-18 DIAGNOSIS — I639 Cerebral infarction, unspecified: Secondary | ICD-10-CM

## 2022-11-21 NOTE — Progress Notes (Signed)
[facility] Summary Report / Loop Recorder 

## 2022-12-13 ENCOUNTER — Emergency Department (HOSPITAL_BASED_OUTPATIENT_CLINIC_OR_DEPARTMENT_OTHER): Payer: Medicare Other

## 2022-12-13 ENCOUNTER — Encounter (HOSPITAL_BASED_OUTPATIENT_CLINIC_OR_DEPARTMENT_OTHER): Payer: Self-pay | Admitting: Emergency Medicine

## 2022-12-13 ENCOUNTER — Emergency Department (HOSPITAL_BASED_OUTPATIENT_CLINIC_OR_DEPARTMENT_OTHER)
Admission: EM | Admit: 2022-12-13 | Discharge: 2022-12-14 | Disposition: A | Discharge status: Home / Self Care | Payer: Medicare Other | Attending: Emergency Medicine | Admitting: Emergency Medicine

## 2022-12-13 ENCOUNTER — Ambulatory Visit (INDEPENDENT_AMBULATORY_CARE_PROVIDER_SITE_OTHER): Payer: Medicare Other

## 2022-12-13 ENCOUNTER — Other Ambulatory Visit: Payer: Self-pay

## 2022-12-13 DIAGNOSIS — K5641 Fecal impaction: Secondary | ICD-10-CM

## 2022-12-13 DIAGNOSIS — J449 Chronic obstructive pulmonary disease, unspecified: Secondary | ICD-10-CM | POA: Diagnosis not present

## 2022-12-13 DIAGNOSIS — I639 Cerebral infarction, unspecified: Secondary | ICD-10-CM | POA: Diagnosis not present

## 2022-12-13 DIAGNOSIS — Z79899 Other long term (current) drug therapy: Secondary | ICD-10-CM | POA: Diagnosis not present

## 2022-12-13 DIAGNOSIS — K5900 Constipation, unspecified: Secondary | ICD-10-CM | POA: Diagnosis present

## 2022-12-13 DIAGNOSIS — Z7902 Long term (current) use of antithrombotics/antiplatelets: Secondary | ICD-10-CM | POA: Insufficient documentation

## 2022-12-13 LAB — CBC WITH DIFFERENTIAL/PLATELET
Abs Immature Granulocytes: 0.02 10*3/uL (ref 0.00–0.07)
Basophils Absolute: 0 10*3/uL (ref 0.0–0.1)
Basophils Relative: 0 %
Eosinophils Absolute: 0.1 10*3/uL (ref 0.0–0.5)
Eosinophils Relative: 1 %
HCT: 43.2 % (ref 39.0–52.0)
Hemoglobin: 14 g/dL (ref 13.0–17.0)
Immature Granulocytes: 0 %
Lymphocytes Relative: 10 %
Lymphs Abs: 0.8 10*3/uL (ref 0.7–4.0)
MCH: 29.9 pg (ref 26.0–34.0)
MCHC: 32.4 g/dL (ref 30.0–36.0)
MCV: 92.1 fL (ref 80.0–100.0)
Monocytes Absolute: 0.4 10*3/uL (ref 0.1–1.0)
Monocytes Relative: 4 %
Neutro Abs: 7.4 10*3/uL (ref 1.7–7.7)
Neutrophils Relative %: 85 %
Platelets: 409 10*3/uL — ABNORMAL HIGH (ref 150–400)
RBC: 4.69 MIL/uL (ref 4.22–5.81)
RDW: 15.5 % (ref 11.5–15.5)
WBC: 8.7 10*3/uL (ref 4.0–10.5)
nRBC: 0 % (ref 0.0–0.2)

## 2022-12-13 LAB — LIPASE, BLOOD: Lipase: 40 U/L (ref 11–51)

## 2022-12-13 LAB — COMPREHENSIVE METABOLIC PANEL WITH GFR
ALT: 22 U/L (ref 0–44)
AST: 14 U/L — ABNORMAL LOW (ref 15–41)
Albumin: 4 g/dL (ref 3.5–5.0)
Alkaline Phosphatase: 75 U/L (ref 38–126)
Anion gap: 9 (ref 5–15)
BUN: 20 mg/dL (ref 8–23)
CO2: 23 mmol/L (ref 22–32)
Calcium: 8.6 mg/dL — ABNORMAL LOW (ref 8.9–10.3)
Chloride: 104 mmol/L (ref 98–111)
Creatinine, Ser: 0.88 mg/dL (ref 0.61–1.24)
GFR, Estimated: >60 mL/min
Glucose, Bld: 141 mg/dL — ABNORMAL HIGH (ref 70–99)
Potassium: 3.7 mmol/L (ref 3.5–5.1)
Sodium: 136 mmol/L (ref 135–145)
Total Bilirubin: 0.8 mg/dL (ref 0.3–1.2)
Total Protein: 7.3 g/dL (ref 6.5–8.1)

## 2022-12-13 LAB — LACTIC ACID, PLASMA: Lactic Acid, Venous: 1.3 mmol/L (ref 0.5–1.9)

## 2022-12-13 NOTE — ED Triage Notes (Signed)
C/o constipation since yesterday. Last BM yesterday morning. States his BM was "slow" with hard clumps. Reports using a suppository with no relief. Denies rectal bleeding.

## 2022-12-14 ENCOUNTER — Other Ambulatory Visit: Payer: Self-pay

## 2022-12-14 ENCOUNTER — Emergency Department (HOSPITAL_BASED_OUTPATIENT_CLINIC_OR_DEPARTMENT_OTHER): Payer: Medicare Other

## 2022-12-14 ENCOUNTER — Encounter (HOSPITAL_BASED_OUTPATIENT_CLINIC_OR_DEPARTMENT_OTHER): Payer: Self-pay

## 2022-12-14 LAB — OCCULT BLOOD X 1 CARD TO LAB, STOOL: Fecal Occult Bld: POSITIVE — AB

## 2022-12-14 MED ORDER — POLYETHYLENE GLYCOL 3350 17 G PO PACK: 17.0000 g | PACK | Freq: Every day | ORAL | 0 refills | Status: AC

## 2022-12-14 MED ORDER — ONDANSETRON HCL 4 MG/2ML IJ SOLN
4.0000 mg | Freq: Once | INTRAMUSCULAR | Status: AC
  Administered 2022-12-14: 4 mg via INTRAVENOUS
  Filled 2022-12-14: qty 2

## 2022-12-14 MED ORDER — SORBITOL 70 % SOLN: 960.0000 mL | TOPICAL_OIL | Freq: Once | ORAL | Status: DC

## 2022-12-14 MED ORDER — IOHEXOL 350 MG/ML SOLN
100.0000 mL | Freq: Once | INTRAVENOUS | Status: AC | PRN
  Administered 2022-12-14: 100 mL via INTRAVENOUS

## 2022-12-14 MED ORDER — SODIUM CHLORIDE 0.9 % IV BOLUS
1000.0000 mL | Freq: Once | INTRAVENOUS | Status: AC
  Administered 2022-12-14: 1000 mL via INTRAVENOUS

## 2022-12-14 MED ORDER — FLEET ENEMA 7-19 GM/118ML RE ENEM
1.0000 | ENEMA | Freq: Once | RECTAL | Status: AC
  Administered 2022-12-14: 1 via RECTAL
  Filled 2022-12-14: qty 1

## 2022-12-14 NOTE — ED Notes (Signed)
Pt had a successful BM after soap suds enema, pt reports feeling better and his abd feels less distended. Dr. Manus Gunning notified of pt's Bm and feeling better. Pt and family member ready to be d/c home.

## 2022-12-14 NOTE — ED Provider Notes (Signed)
[facility] [facility] AT [facility] Provider Note   CSN: [id number] Arrival date & time: 12/13/22  2248     History  Chief Complaint  Patient presents with   Constipation    Adrian Rodgers is a 82 y.o. male.  Patient with a history of pretension, previous stroke, COPD on home oxygen, previous ulcer, high cholesterol presenting with constipation since this morning.  Last bowel movement was yesterday morning and was normal.  He struggled on the toilet this morning was only able to pass a few hard balls of stool.  Denies any black or bloody stools.  States he normally goes every day.  Does take fiber.  Has not had any nausea or vomiting.  No fever.  No chest pain or shortness of breath.  Feels like his breathing is at baseline.  Takes Plavix at home but no other blood thinners.  No history of constipation like this in the past.  Tried a suppository at home without relief.  Denies abdominal pain or rectal pain.  No fever.  No dizziness or lightheadedness.  The history is provided by the patient and a relative.  Constipation Associated symptoms: no abdominal pain, no dysuria, no fever, no nausea and no vomiting        Home Medications Prior to Admission medications   Medication Sig Start Date End Date Taking? Authorizing Provider  acetaminophen (TYLENOL) 500 MG tablet Take 1,000 mg by mouth as needed for moderate pain, mild pain, fever or headache.    [provider]  albuterol (PROVENTIL HFA;VENTOLIN HFA) 108 (90 Base) MCG/ACT inhaler Inhale 2 puffs into the lungs every 6 (six) hours as needed for wheezing or shortness of breath. Patient taking differently: Inhale 2 puffs into the lungs as needed for wheezing or shortness of breath. 09/21/17   Purohit, Salli Quarry, MD  atorvastatin (LIPITOR) 80 MG tablet Take 1 tablet (80 mg total) by mouth daily. 04/03/22   Osvaldo Shipper, MD  BREZTRI AEROSPHERE 160-9-4.8 MCG/ACT AERO Inhale 2 puffs into the lungs 2 (two)  times daily.    [provider]  clopidogrel (PLAVIX) 75 MG tablet Take 1 tablet by mouth daily. 10/23/21   [provider]  ferrous sulfate 325 (65 FE) MG EC tablet Take 1 tablet (325 mg total) by mouth daily with breakfast. 10/30/22   Armbruster, Willaim Rayas, MD  losartan (COZAAR) 25 MG tablet Take 1 tablet (25 mg total) by mouth daily. 04/13/22 04/13/23  Albertine Grates, MD  Multiple Vitamins-Minerals (CENTRUM SILVER 50+MEN) TABS Take 2 tablets by mouth daily with breakfast.    [provider]  nicotine (NICODERM CQ - DOSED IN MG/24 HOURS) 14 mg/24hr patch Place 14 mg onto the skin daily.    [provider]  pantoprazole (PROTONIX) 20 MG tablet Take 1 tablet (20 mg total) by mouth daily. 10/29/22   Armbruster, Willaim Rayas, MD  tamsulosin (FLOMAX) 0.4 MG CAPS capsule Take 1 capsule (0.4 mg total) by mouth daily. 04/14/22   Albertine Grates, MD      Allergies    Patient has no known allergies.    Review of Systems   Review of Systems  Constitutional:  Negative for activity change, appetite change and fever.  HENT:  Negative for congestion and rhinorrhea.   Respiratory:  Negative for cough, chest tightness and shortness of breath.   Gastrointestinal:  Positive for constipation. Negative for abdominal pain, nausea and vomiting.  Genitourinary:  Negative for dysuria and hematuria.  Musculoskeletal:  Negative for  arthralgias and myalgias.  Skin:  Negative for rash.  Neurological:  Negative for dizziness, weakness and headaches.   all other systems are negative except as noted in the HPI and PMH.    Physical Exam Updated Vital Signs BP (!) 154/96 (BP Location: Right Arm)   Pulse 97   Temp 98.4 F (36.9 C) (Oral)   Resp 17   Ht 6' (1.829 m)   Wt 90.3 kg   SpO2 91%   BMI 26.99 kg/m  Physical Exam Vitals and nursing note reviewed.  Constitutional:      General: He is not in acute distress.    Appearance: He is well-developed.  HENT:     Head: Normocephalic and  atraumatic.     Mouth/Throat:     Pharynx: No oropharyngeal exudate.  Eyes:     Conjunctiva/sclera: Conjunctivae normal.     Pupils: Pupils are equal, round, and reactive to light.  Neck:     Comments: No meningismus. Cardiovascular:     Rate and Rhythm: Normal rate and regular rhythm.     Heart sounds: Normal heart sounds. No murmur heard. Pulmonary:     Effort: Pulmonary effort is normal. No respiratory distress.     Breath sounds: Normal breath sounds.  Abdominal:     Palpations: Abdomen is soft.     Tenderness: There is abdominal tenderness. There is no guarding or rebound.  Genitourinary:    Comments: Chaperone present. Mandie RN.  Firm brown stool in rectal vault, manually disimpacted Musculoskeletal:        General: No tenderness. Normal range of motion.     Cervical back: Normal range of motion and neck supple.     Comments: Equal DP and PT pulses  Skin:    General: Skin is warm.  Neurological:     Mental Status: He is alert and oriented to person, place, and time.     Cranial Nerves: No cranial nerve deficit.     Motor: No abnormal muscle tone.     Coordination: Coordination normal.     Comments: No ataxia on finger to nose bilaterally. No pronator drift. 5/5 strength throughout. CN 2-12 intact.Equal grip strength. Sensation intact.   Psychiatric:        Behavior: Behavior normal.     ED Results / Procedures / Treatments   Labs (all labs ordered are listed, but only abnormal results are displayed) Labs Reviewed  CBC WITH DIFFERENTIAL/PLATELET - Abnormal; Notable for the following components:      Result Value   Platelets 409 (*)    All other components within normal limits  COMPREHENSIVE METABOLIC PANEL - Abnormal; Notable for the following components:   Glucose, Bld 141 (*)    Calcium 8.6 (*)    AST 14 (*)    All other components within normal limits  OCCULT BLOOD X 1 CARD TO LAB, STOOL - Abnormal; Notable for the following components:   Fecal Occult Bld  POSITIVE (*)    All other components within normal limits  LIPASE, BLOOD  LACTIC ACID, PLASMA  URINALYSIS, ROUTINE W REFLEX MICROSCOPIC    EKG None  Radiology CT ABDOMEN PELVIS W CONTRAST  Result Date: 12/14/2022 CLINICAL DATA:  Abdominal pain, acute, nonlocalized.  Constipation EXAM: CT ABDOMEN AND PELVIS WITH CONTRAST TECHNIQUE: Multidetector CT imaging of the abdomen and pelvis was performed using the standard protocol following bolus administration of intravenous contrast. RADIATION DOSE REDUCTION: This exam was performed according to the departmental dose-optimization program which includes automated exposure  control, adjustment of the mA and/or kV according to patient size and/or use of iterative reconstruction technique. CONTRAST:  OMNIPAQUE IOHEXOL 350 MG/ML SOLN COMPARISON:  None Available. FINDINGS: Lower chest: No acute abnormality Hepatobiliary: No focal hepatic abnormality. Gallbladder unremarkable. Pancreas: No focal abnormality or ductal dilatation. Spleen: No focal abnormality.  Normal size. Adrenals/Urinary Tract: Adrenal glands normal. Bilateral renal cysts appear benign. The largest is in the left midpole measuring 5.5 cm. No follow-up imaging recommended. No stones or hydronephrosis. Urinary bladder unremarkable. Stomach/Bowel: Moderate stool burden throughout the colon. Large stool burden in the rectum. Cannot exclude early fecal impaction. Stomach and small bowel decompressed, unremarkable. No bowel obstruction. Vascular/Lymphatic: No evidence of aneurysm or adenopathy. Aortic atherosclerosis. Reproductive: Significantly enlarged prostate protruding into the base of the bladder. Peripheral calcifications throughout the prostate. Other: No free fluid or free air. Musculoskeletal: No acute bony abnormality. IMPRESSION: Moderate stool burden throughout the colon with large stool burden in the rectum. Cannot exclude early fecal impaction. Aortic atherosclerosis. Significant  prostate enlargement with extensive peripheral calcifications. Electronically Signed   By: Charlett Nose M.D.   On: 12/14/2022 02:48   CT Angio Chest PE W and/or Wo Contrast  Result Date: 12/14/2022 CLINICAL DATA:  Pulmonary embolism (PE) suspected, high prob EXAM: CT ANGIOGRAPHY CHEST WITH CONTRAST TECHNIQUE: Multidetector CT imaging of the chest was performed using the standard protocol during bolus administration of intravenous contrast. Multiplanar CT image reconstructions and MIPs were obtained to evaluate the vascular anatomy. RADIATION DOSE REDUCTION: This exam was performed according to the departmental dose-optimization program which includes automated exposure control, adjustment of the mA and/or kV according to patient size and/or use of iterative reconstruction technique. CONTRAST:  OMNIPAQUE IOHEXOL 350 MG/ML SOLN COMPARISON:  10/23/2022 FINDINGS: Cardiovascular: No filling defects in the pulmonary arteries to suggest pulmonary emboli. Heart is normal size. Aorta is normal caliber. Scattered coronary artery and aortic calcifications. Mediastinum/Nodes: No mediastinal, hilar, or axillary adenopathy. Trachea and esophagus are unremarkable. Thyroid unremarkable. Lungs/Pleura: Mild centrilobular emphysema. No confluent airspace opacities or effusions. Upper Abdomen: No acute findings Musculoskeletal: Chest wall soft tissues are unremarkable. No acute bony abnormality. Review of the MIP images confirms the above findings. IMPRESSION: No evidence of pulmonary embolus. Coronary artery disease. No acute cardiopulmonary disease. Aortic Atherosclerosis (ICD10-I70.0). Electronically Signed   By: Charlett Nose M.D.   On: 12/14/2022 02:45   DG Abdomen Acute W/Chest  Result Date: 12/13/2022 CLINICAL DATA:  Constipation EXAM: DG ABDOMEN ACUTE WITH 1 VIEW CHEST COMPARISON:  04/08/2022 FINDINGS: Cardiac shadow is stable. Loop recorder is seen. Lungs are hyperinflated but clear. Scattered large and small  bowel gas is noted. No abnormal mass or abnormal calcifications are seen. No free air is noted. No acute bony abnormality is seen. IMPRESSION: No acute abnormality in the chest and abdomen. Electronically Signed   By: Alcide Clever M.D.   On: 12/13/2022 23:51    Procedures Fecal disimpaction  Date/Time: 12/14/2022 12:10 AM  Performed by: Glynn Octave, MD Authorized by: Glynn Octave, MD  Consent: Verbal consent obtained. Risks and benefits: risks, benefits and alternatives were discussed Consent given by: patient Patient understanding: patient states understanding of the procedure being performed Patient identity confirmed: verbally with patient Time out: Immediately prior to procedure a "time out" was called to verify the correct patient, procedure, equipment, support staff and site/side marked as required. Preparation: Patient was prepped and draped in the usual sterile fashion. Local anesthesia used: no  Anesthesia: Local anesthesia used: no  Sedation:  Patient sedated: no  Patient tolerance: patient tolerated the procedure well with no immediate complications       Medications Ordered in ED Medications  sodium phosphate (FLEET) 7-19 GM/118ML enema 1 enema (has no administration in time range)  sodium chloride 0.9 % bolus 1,000 mL (has no administration in time range)  ondansetron (ZOFRAN) injection 4 mg (has no administration in time range)    ED Course/ Medical Decision Making/ A&P                             Medical Decision Making Amount and/or Complexity of Data Reviewed Independent Historian: guardian Labs: ordered. Decision-making details documented in ED Course. Radiology: ordered and independent interpretation performed. Decision-making details documented in ED Course. ECG/medicine tests: ordered and independent interpretation performed. Decision-making details documented in ED Course.  Risk OTC drugs. Prescription drug management.   Constipation  for the past 1 day.  Last bowel movement was this morning.  Abdomen is soft without peritoneal signs.  Vital signs are stable.  Does have some borderline hypoxia but does wear oxygen as needed. Hx COPD.  Fecal impaction on exam was manually disimpacted  X-rays remarkable for constipation without evidence of bowel obstruction.  IV fluids given.  Enema given.  Labs are reassuring.  Hemoglobin is stable, creatinine is at baseline.  LFTs and lipase are normal  CT scan shows constipation without bowel obstruction.  After multiple enemas, patient was able to able to have a successful bowel movement and feels much better.  Stool is brown.  No black or bloody stools despite positive Hemoccult.  Does have history of gastric ulcers in the past.  Hemoglobin today stable.  Vital signs are stable  Patient feels much improved.  He is tolerating p.o.  Abdomen is soft without tenderness.  Discussed MiraLAX regimen at home as well as PCP follow-up and oral hydration.  Return to the ED with worsening pain, difficulty breathing, rectal bleeding, chest pain, any other concerns.       Final Clinical Impression(s) / ED Diagnoses Final diagnoses:  Fecal impaction in rectum [facility])    Rx / DC Orders ED Discharge Orders     None         Maleeah Crossman, Jeannett Senior, MD 12/14/22 0522

## 2022-12-14 NOTE — Discharge Instructions (Signed)
Take MiraLAX twice daily for the next 3 days then once daily after this.  Follow-up with your doctor.  Keep yourself well-hydrated.  Return to the ED with worsening pain, fever, vomiting, difficulty breathing, chest pain, not able to have a bowel movement or any other concerns.

## 2022-12-14 NOTE — ED Notes (Signed)
Pt has finished Using the BR, small amount of stool burden expelled after enema. Pt able to cleanse self, pt was assisted with disposing his old brief and a clean diaper applied to pt.   Pt taken to CT at this time, radiologist tech will return pt to RM 11 once CT has been completed.

## 2022-12-14 NOTE — ED Notes (Signed)
Pt given a soap suds enema, pt handle it well, pt is currently on bedside commode, family member at bedside

## 2022-12-14 NOTE — ED Notes (Signed)
ED Provider at bedside. 

## 2022-12-14 NOTE — ED Notes (Signed)
Pt assisted to BR for enema, enema given by RN, pt educated on enema, verbalized understanding, pt given call bell, advised to pull when he is finished using the bathroom.   Pt's bed linen was changed as there was a small amount of stool smeared on bed sheets.

## 2022-12-16 LAB — CUP PACEART REMOTE DEVICE CHECK
Date Time Interrogation Session: 20240615003021
Implantable Pulse Generator Implant Date: 20211206

## 2022-12-17 NOTE — Progress Notes (Signed)
[facility] Summary Report / Loop Recorder 

## 2022-12-19 ENCOUNTER — Emergency Department (HOSPITAL_BASED_OUTPATIENT_CLINIC_OR_DEPARTMENT_OTHER): Payer: Medicare Other

## 2022-12-19 ENCOUNTER — Encounter (HOSPITAL_BASED_OUTPATIENT_CLINIC_OR_DEPARTMENT_OTHER): Payer: Self-pay

## 2022-12-19 ENCOUNTER — Other Ambulatory Visit: Payer: Self-pay

## 2022-12-19 ENCOUNTER — Inpatient Hospital Stay (HOSPITAL_BASED_OUTPATIENT_CLINIC_OR_DEPARTMENT_OTHER)
Admission: EM | Admit: 2022-12-19 | Discharge: 2022-12-21 | DRG: 871 | Disposition: A | Discharge status: Home / Self Care | Payer: Medicare Other | Attending: Student | Admitting: Student

## 2022-12-19 DIAGNOSIS — Z1152 Encounter for screening for COVID-19: Secondary | ICD-10-CM

## 2022-12-19 DIAGNOSIS — J189 Pneumonia, unspecified organism: Secondary | ICD-10-CM | POA: Diagnosis present

## 2022-12-19 DIAGNOSIS — I69998 Other sequelae following unspecified cerebrovascular disease: Secondary | ICD-10-CM

## 2022-12-19 DIAGNOSIS — Z79899 Other long term (current) drug therapy: Secondary | ICD-10-CM

## 2022-12-19 DIAGNOSIS — Z8711 Personal history of peptic ulcer disease: Secondary | ICD-10-CM

## 2022-12-19 DIAGNOSIS — Z8249 Family history of ischemic heart disease and other diseases of the circulatory system: Secondary | ICD-10-CM

## 2022-12-19 DIAGNOSIS — J449 Chronic obstructive pulmonary disease, unspecified: Secondary | ICD-10-CM | POA: Diagnosis present

## 2022-12-19 DIAGNOSIS — J441 Chronic obstructive pulmonary disease with (acute) exacerbation: Secondary | ICD-10-CM | POA: Diagnosis present

## 2022-12-19 DIAGNOSIS — R0902 Hypoxemia: Secondary | ICD-10-CM | POA: Diagnosis present

## 2022-12-19 DIAGNOSIS — R2689 Other abnormalities of gait and mobility: Secondary | ICD-10-CM | POA: Diagnosis present

## 2022-12-19 DIAGNOSIS — Z716 Tobacco abuse counseling: Secondary | ICD-10-CM

## 2022-12-19 DIAGNOSIS — J440 Chronic obstructive pulmonary disease with acute lower respiratory infection: Secondary | ICD-10-CM | POA: Diagnosis present

## 2022-12-19 DIAGNOSIS — E7800 Pure hypercholesterolemia, unspecified: Secondary | ICD-10-CM | POA: Diagnosis present

## 2022-12-19 DIAGNOSIS — R4182 Altered mental status, unspecified: Secondary | ICD-10-CM | POA: Diagnosis present

## 2022-12-19 DIAGNOSIS — K219 Gastro-esophageal reflux disease without esophagitis: Secondary | ICD-10-CM | POA: Diagnosis present

## 2022-12-19 DIAGNOSIS — J439 Emphysema, unspecified: Secondary | ICD-10-CM | POA: Diagnosis present

## 2022-12-19 DIAGNOSIS — A419 Sepsis, unspecified organism: Principal | ICD-10-CM | POA: Diagnosis present

## 2022-12-19 DIAGNOSIS — I10 Essential (primary) hypertension: Secondary | ICD-10-CM | POA: Diagnosis not present

## 2022-12-19 DIAGNOSIS — D649 Anemia, unspecified: Secondary | ICD-10-CM | POA: Diagnosis present

## 2022-12-19 DIAGNOSIS — Z8673 Personal history of transient ischemic attack (TIA), and cerebral infarction without residual deficits: Secondary | ICD-10-CM

## 2022-12-19 DIAGNOSIS — E785 Hyperlipidemia, unspecified: Secondary | ICD-10-CM | POA: Diagnosis not present

## 2022-12-19 DIAGNOSIS — Z7902 Long term (current) use of antithrombotics/antiplatelets: Secondary | ICD-10-CM

## 2022-12-19 DIAGNOSIS — F17210 Nicotine dependence, cigarettes, uncomplicated: Secondary | ICD-10-CM | POA: Diagnosis present

## 2022-12-19 DIAGNOSIS — I445 Left posterior fascicular block: Secondary | ICD-10-CM | POA: Diagnosis present

## 2022-12-19 DIAGNOSIS — I700 Atherosclerosis of aorta: Secondary | ICD-10-CM | POA: Diagnosis present

## 2022-12-19 DIAGNOSIS — N400 Enlarged prostate without lower urinary tract symptoms: Secondary | ICD-10-CM | POA: Diagnosis present

## 2022-12-19 DIAGNOSIS — Z720 Tobacco use: Secondary | ICD-10-CM | POA: Diagnosis present

## 2022-12-19 LAB — CBC
HCT: 41.7 % (ref 39.0–52.0)
Hemoglobin: 13.7 g/dL (ref 13.0–17.0)
MCH: 30.4 pg (ref 26.0–34.0)
MCHC: 32.9 g/dL (ref 30.0–36.0)
MCV: 92.7 fL (ref 80.0–100.0)
Platelets: 383 10*3/uL (ref 150–400)
RBC: 4.5 MIL/uL (ref 4.22–5.81)
RDW: 15.6 % — ABNORMAL HIGH (ref 11.5–15.5)
WBC: 10.6 10*3/uL — ABNORMAL HIGH (ref 4.0–10.5)
nRBC: 0 % (ref 0.0–0.2)

## 2022-12-19 LAB — RESP PANEL BY RT-PCR (RSV, FLU A&B, COVID)  RVPGX2
Influenza A by PCR: NEGATIVE
Influenza B by PCR: NEGATIVE
Resp Syncytial Virus by PCR: NEGATIVE
SARS Coronavirus 2 by RT PCR: NEGATIVE

## 2022-12-19 LAB — BASIC METABOLIC PANEL
Anion gap: 14 (ref 5–15)
BUN: 14 mg/dL (ref 8–23)
CO2: 21 mmol/L — ABNORMAL LOW (ref 22–32)
Calcium: 8.8 mg/dL — ABNORMAL LOW (ref 8.9–10.3)
Chloride: 100 mmol/L (ref 98–111)
Creatinine, Ser: 1.09 mg/dL (ref 0.61–1.24)
GFR, Estimated: >60 mL/min (ref >60)
Glucose, Bld: 99 mg/dL (ref 70–99)
Potassium: 3.9 mmol/L (ref 3.5–5.1)
Sodium: 135 mmol/L (ref 135–145)

## 2022-12-19 LAB — CBG MONITORING, ED: Glucose-Capillary: 85 mg/dL (ref 70–99)

## 2022-12-19 MED ORDER — SODIUM CHLORIDE 0.9 % IV SOLN
1.0000 g | Freq: Once | INTRAVENOUS | Status: AC
  Administered 2022-12-19: 1 g via INTRAVENOUS
  Filled 2022-12-19: qty 10

## 2022-12-19 MED ORDER — ADULT MULTIVITAMIN W/MINERALS CH
2.0000 | ORAL_TABLET | Freq: Every day | ORAL | Status: DC
  Administered 2022-12-20 – 2022-12-21 (×2): 2 via ORAL
  Filled 2022-12-19 (×2): qty 2

## 2022-12-19 MED ORDER — IPRATROPIUM-ALBUTEROL 0.5-2.5 (3) MG/3ML IN SOLN
3.0000 mL | Freq: Four times a day (QID) | RESPIRATORY_TRACT | Status: DC
  Administered 2022-12-20: 3 mL via RESPIRATORY_TRACT
  Filled 2022-12-19: qty 3

## 2022-12-19 MED ORDER — LACTATED RINGERS IV BOLUS
1000.0000 mL | Freq: Once | INTRAVENOUS | Status: AC
  Administered 2022-12-19: 1000 mL via INTRAVENOUS

## 2022-12-19 MED ORDER — SODIUM CHLORIDE 0.9 % IV SOLN: INTRAVENOUS | Status: DC

## 2022-12-19 MED ORDER — CLOPIDOGREL BISULFATE 75 MG PO TABS
75.0000 mg | ORAL_TABLET | Freq: Every day | ORAL | Status: DC
  Administered 2022-12-20 – 2022-12-21 (×2): 75 mg via ORAL
  Filled 2022-12-19 (×2): qty 1

## 2022-12-19 MED ORDER — NICOTINE 14 MG/24HR TD PT24
14.0000 mg | MEDICATED_PATCH | Freq: Every day | TRANSDERMAL | Status: DC
  Administered 2022-12-20 – 2022-12-21 (×2): 14 mg via TRANSDERMAL
  Filled 2022-12-19 (×2): qty 1

## 2022-12-19 MED ORDER — POLYETHYLENE GLYCOL 3350 17 G PO PACK
17.0000 g | PACK | Freq: Every day | ORAL | Status: DC
  Administered 2022-12-20 – 2022-12-21 (×2): 17 g via ORAL
  Filled 2022-12-19 (×2): qty 1

## 2022-12-19 MED ORDER — MAGNESIUM HYDROXIDE 400 MG/5ML PO SUSP: 30.0000 mL | Freq: Every day | ORAL | Status: DC | PRN

## 2022-12-19 MED ORDER — ONDANSETRON HCL 4 MG/2ML IJ SOLN: 4.0000 mg | Freq: Four times a day (QID) | INTRAMUSCULAR | Status: DC | PRN

## 2022-12-19 MED ORDER — LOSARTAN POTASSIUM 50 MG PO TABS
25.0000 mg | ORAL_TABLET | Freq: Every day | ORAL | Status: DC
  Administered 2022-12-20 – 2022-12-21 (×2): 25 mg via ORAL
  Filled 2022-12-19 (×2): qty 1

## 2022-12-19 MED ORDER — GUAIFENESIN ER 600 MG PO TB12
600.0000 mg | ORAL_TABLET | Freq: Two times a day (BID) | ORAL | Status: DC
  Administered 2022-12-20 – 2022-12-21 (×4): 600 mg via ORAL
  Filled 2022-12-19 (×4): qty 1

## 2022-12-19 MED ORDER — HYDROCOD POLI-CHLORPHE POLI ER 10-8 MG/5ML PO SUER
5.0000 mL | Freq: Two times a day (BID) | ORAL | Status: DC | PRN
  Administered 2022-12-20: 5 mL via ORAL
  Filled 2022-12-19: qty 5

## 2022-12-19 MED ORDER — BUDESON-GLYCOPYRROL-FORMOTEROL 160-9-4.8 MCG/ACT IN AERO: 2.0000 | INHALATION_SPRAY | Freq: Two times a day (BID) | RESPIRATORY_TRACT | Status: DC

## 2022-12-19 MED ORDER — TRAZODONE HCL 50 MG PO TABS: 25.0000 mg | ORAL_TABLET | Freq: Every evening | ORAL | Status: DC | PRN

## 2022-12-19 MED ORDER — SODIUM CHLORIDE 0.9 % IV SOLN
500.0000 mg | INTRAVENOUS | Status: DC
  Administered 2022-12-20: 500 mg via INTRAVENOUS
  Filled 2022-12-19 (×2): qty 5

## 2022-12-19 MED ORDER — SODIUM CHLORIDE 0.9 % IV SOLN
2.0000 g | INTRAVENOUS | Status: DC
  Administered 2022-12-20: 2 g via INTRAVENOUS
  Filled 2022-12-19: qty 20

## 2022-12-19 MED ORDER — SODIUM CHLORIDE 0.9 % IV SOLN
500.0000 mg | Freq: Once | INTRAVENOUS | Status: AC
  Administered 2022-12-19: 500 mg via INTRAVENOUS
  Filled 2022-12-19: qty 5

## 2022-12-19 MED ORDER — TAMSULOSIN HCL 0.4 MG PO CAPS
0.4000 mg | ORAL_CAPSULE | Freq: Every day | ORAL | Status: DC
  Administered 2022-12-20 – 2022-12-21 (×2): 0.4 mg via ORAL
  Filled 2022-12-19 (×2): qty 1

## 2022-12-19 MED ORDER — ATORVASTATIN CALCIUM 80 MG PO TABS
80.0000 mg | ORAL_TABLET | Freq: Every day | ORAL | Status: DC
  Administered 2022-12-20 – 2022-12-21 (×2): 80 mg via ORAL
  Filled 2022-12-19 (×2): qty 1

## 2022-12-19 MED ORDER — PANTOPRAZOLE SODIUM 20 MG PO TBEC
20.0000 mg | DELAYED_RELEASE_TABLET | Freq: Every day | ORAL | Status: DC
  Administered 2022-12-20 – 2022-12-21 (×2): 20 mg via ORAL
  Filled 2022-12-19 (×2): qty 1

## 2022-12-19 MED ORDER — ACETAMINOPHEN 325 MG PO TABS: 650.0000 mg | ORAL_TABLET | Freq: Four times a day (QID) | ORAL | Status: DC | PRN

## 2022-12-19 MED ORDER — ACETAMINOPHEN 650 MG RE SUPP: 650.0000 mg | Freq: Four times a day (QID) | RECTAL | Status: DC | PRN

## 2022-12-19 MED ORDER — ENOXAPARIN SODIUM 40 MG/0.4ML IJ SOSY
40.0000 mg | PREFILLED_SYRINGE | INTRAMUSCULAR | Status: DC
  Administered 2022-12-20 – 2022-12-21 (×2): 40 mg via SUBCUTANEOUS
  Filled 2022-12-19 (×2): qty 0.4

## 2022-12-19 MED ORDER — ONDANSETRON HCL 4 MG PO TABS: 4.0000 mg | ORAL_TABLET | Freq: Four times a day (QID) | ORAL | Status: DC | PRN

## 2022-12-19 MED ORDER — FERROUS SULFATE 325 (65 FE) MG PO TABS
325.0000 mg | ORAL_TABLET | Freq: Every day | ORAL | Status: DC
  Administered 2022-12-20 – 2022-12-21 (×2): 325 mg via ORAL
  Filled 2022-12-19 (×2): qty 1

## 2022-12-19 NOTE — ED Notes (Signed)
[facility] is on the floor, paperwork & report given, belongings sent with pt

## 2022-12-19 NOTE — ED Notes (Signed)
Gray tube on ice sent to the lab in case a lactic acid is ordered by the provider. One set of blood cultures were also drawn and sent to the lab.

## 2022-12-19 NOTE — ED Notes (Signed)
Report called Elita Boone, RN

## 2022-12-19 NOTE — ED Triage Notes (Addendum)
Pt reports bilateral leg weakness that started yesterday. He states he is unable to walk. Two staff members had to assist him out of his car and into a wheelchair. Hx of stroke last year with residual left leg weakness but now reports both legs are weak. Denies pain, able to speak in clear complete sentences, face is symmetrical.   During triage, it was noted that has a wet cough. Daughter reports he has COPD. Pt is also tachypneic.

## 2022-12-19 NOTE — H&P (Signed)
[place]   PATIENT NAME: Adrian Rodgers    MR#:  [id number]  DATE OF BIRTH:  03/29/1941  DATE OF ADMISSION:  12/19/2022  PRIMARY CARE PHYSICIAN: Ruthell Rummage, PA-C   Patient is coming from: Home  REQUESTING/REFERRING PHYSICIAN: Glyn Ade, MD ([facility]  CHIEF COMPLAINT:   Chief Complaint  Patient presents with   Weakness    HISTORY OF PRESENT ILLNESS:  Adrian Rodgers is a 82 y.o. male with medical history significant for hyperlipidemia, gastric ulcer, H. pylori, hypertension, CVA and tobacco abuse, who presented to the emergency room with acute onset of worsening dyspnea over the last couple days with associated cough productive of yellowish sputum as well as mild wheezing.  He admitted to occasional mild fever and chills.  Did not any chest pain or palpitations.  No nausea or vomiting or abdominal pain.  No dysuria, oliguria or hematuria or flank pain.  No bleeding diathesis.  ED Course: When he came to the ER, heart rate was 107 and respiratory to 40 and later 28 with temperature of 100.2 and otherwise normal vital signs.  Labs revealed a CO2 of 21 with otherwise unremarkable BMP.  CBC showed WBC of 10.6.  Influenza antigens and COVID-19 PCR came back negative. EKG as reviewed by me : EKG showed sinus rhythm with a rate of 99 with left posterior fascicular block Imaging: Portable chest ray showed hazy opacity of the left lower lung suspicious for pneumonia.  The patient was given IV Rocephin and Zithromax and 1 L bolus of IV lactated Ringer.  He was directly admitted to a medical observation bed for further evaluation and management. PAST MEDICAL HISTORY:   Past Medical History:  Diagnosis Date   Aortic atherosclerosis (HCC)    Cerebral aneurysm    Community acquired pneumonia of right middle lobe of lung 09/20/2017   Emphysema of lung (HCC)    Esophagitis    Gastric ulcer    H. 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)    Stroke (HCC) 05/01/2020   Tobacco dependence     PAST SURGICAL HISTORY:   Past Surgical History:  Procedure Laterality Date   BIOPSY  04/10/2022   Procedure: BIOPSY;  Surgeon: Benancio Deeds, MD;  Location: [facility] ENDOSCOPY;  Service: Gastroenterology;;   ESOPHAGOGASTRODUODENOSCOPY (EGD) WITH PROPOFOL N/A 04/10/2022   Procedure: ESOPHAGOGASTRODUODENOSCOPY (EGD) WITH PROPOFOL;  Surgeon: Benancio Deeds, MD;  Location: [facility] ENDOSCOPY;  Service: Gastroenterology;  Laterality: N/A;   HEMOSTASIS CONTROL  04/10/2022   Procedure: HEMOSTASIS CONTROL;  Surgeon: Benancio Deeds, MD;  Location: [facility] ENDOSCOPY;  Service: Gastroenterology;;  purastat    implantable loop recorder placement  06/05/2020   Medtronic Reveal [place] model [id number]) implantable loop recorder     SOCIAL HISTORY:   Social History   Tobacco Use   Smoking status: Every Day    Packs/day: 1.00    Years: 20.00    Additional pack years: 0.00    Total pack years: 20.00    Types: Cigarettes   Smokeless tobacco: Never  Substance Use Topics   Alcohol use: Yes    Alcohol/week: 1.0 standard drink of alcohol    Types: 1 Cans of beer per week    Comment: remote h/o heavy use    FAMILY HISTORY:   Positive for hypertension in his father.  DRUG ALLERGIES:  No Known Allergies  REVIEW OF SYSTEMS:   ROS As per history of present illness. All pertinent systems were reviewed above. Constitutional, HEENT, cardiovascular, respiratory, GI, GU, musculoskeletal, neuro, psychiatric, endocrine, integumentary and hematologic systems were reviewed and are otherwise negative/unremarkable except for positive findings mentioned above in the HPI.   MEDICATIONS AT HOME:   Prior to Admission medications   Medication Sig Start Date End Date Taking? Authorizing Provider  acetaminophen (TYLENOL) 500 MG tablet Take 1,000 mg by mouth as needed  for moderate pain, mild pain, fever or headache.    [provider]  albuterol (PROVENTIL HFA;VENTOLIN HFA) 108 (90 Base) MCG/ACT inhaler Inhale 2 puffs into the lungs every 6 (six) hours as needed for wheezing or shortness of breath. Patient taking differently: Inhale 2 puffs into the lungs as needed for wheezing or shortness of breath. 09/21/17   Purohit, Salli Quarry, MD  atorvastatin (LIPITOR) 80 MG tablet Take 1 tablet (80 mg total) by mouth daily. 04/03/22   Osvaldo Shipper, MD  BREZTRI AEROSPHERE 160-9-4.8 MCG/ACT AERO Inhale 2 puffs into the lungs 2 (two) times daily.    [provider]  clopidogrel (PLAVIX) 75 MG tablet Take 1 tablet by mouth daily. 10/23/21   [provider]  ferrous sulfate 325 (65 FE) MG EC tablet Take 1 tablet (325 mg total) by mouth daily with breakfast. 10/30/22   Armbruster, Willaim Rayas, MD  losartan (COZAAR) 25 MG tablet Take 1 tablet (25 mg total) by mouth daily. 04/13/22 04/13/23  Albertine Grates, MD  Multiple Vitamins-Minerals (CENTRUM SILVER 50+MEN) TABS Take 2 tablets by mouth daily with breakfast.    [provider]  nicotine (NICODERM CQ - DOSED IN MG/24 HOURS) 14 mg/24hr patch Place 14 mg onto the skin daily.    [provider]  pantoprazole (PROTONIX) 20 MG tablet Take 1 tablet (20 mg total) by mouth daily. 10/29/22   Armbruster, Willaim Rayas, MD  polyethylene glycol (MIRALAX) 17 g packet Take 17 g by mouth daily. 12/14/22   Rancour, Jeannett Senior, MD  tamsulosin (FLOMAX) 0.4 MG CAPS capsule Take 1 capsule (0.4 mg total) by mouth daily. 04/14/22   Albertine Grates, MD      VITAL SIGNS:  Blood pressure 106/80, pulse 74, temperature 99.5 F (37.5 C), temperature source Oral, resp. rate 17, height 6' (1.829 m), weight 90.3 kg, SpO2 94 %.  PHYSICAL EXAMINATION:  Physical Exam  GENERAL:  82 y.o.-year-old African-American male patient lying in the bed with no acute distress.  EYES: Pupils equal, round, reactive to light and accommodation. No scleral  icterus. Extraocular muscles intact.  HEENT: Head atraumatic, normocephalic. Oropharynx and nasopharynx clear.  NECK:  Supple, no jugular venous distention. No thyroid enlargement, no tenderness.  LUNGS: Diminished left basal breath sounds with left basal crackles.. No use of accessory muscles of respiration.  CARDIOVASCULAR: Regular rate and rhythm, S1, S2 normal. No murmurs, rubs, or gallops.  ABDOMEN: Soft, nondistended, nontender. Bowel sounds present. No organomegaly or mass.  EXTREMITIES: No pedal edema, cyanosis, or clubbing.  NEUROLOGIC: Cranial nerves II through XII are intact. Muscle strength 5/5 in all extremities. Sensation intact. Gait not checked.  PSYCHIATRIC: The patient is alert and oriented x 3.  Normal affect and good eye contact. SKIN: No obvious rash, lesion, or ulcer.   LABORATORY PANEL:   CBC Recent Labs  Lab 12/19/22 1945  WBC 10.6*  HGB 13.7  HCT 41.7  PLT 383   ------------------------------------------------------------------------------------------------------------------  Chemistries  Recent Labs  Lab 12/13/22 2319 12/19/22  1945  NA 136 135  K 3.7 3.9  CL 104 100  CO2 23 21*  GLUCOSE 141* 99  BUN 20 14  CREATININE 0.88 1.09  CALCIUM 8.6* 8.8*  AST 14*  --   ALT 22  --   ALKPHOS 75  --   BILITOT 0.8  --    ------------------------------------------------------------------------------------------------------------------  Cardiac Enzymes No results for input(s): "TROPONINI" in the last 168 hours. ------------------------------------------------------------------------------------------------------------------  RADIOLOGY:  DG Chest Port 1 View  Result Date: 12/19/2022 CLINICAL DATA:  Respiratory infection that is common for 2 days. Productive cough. EXAM: PORTABLE CHEST 1 VIEW COMPARISON:  CT chest 12/14/2022 and radiographs 01/12/2023 FINDINGS: Stable cardiomegaly. Loop recorder. Bibasilar atelectasis. Hazy opacity in the left lower lung  obscuring the left heart border suspicious for pneumonia. No pleural effusion or pneumothorax. No displaced rib fractures. IMPRESSION: Hazy opacity in the left lower lung suspicious for pneumonia. Electronically Signed   By: Minerva Fester M.D.   On: 12/19/2022 20:13      IMPRESSION AND PLAN:  Assessment and Plan: * LLL pneumonia - The patient will be admitted to a medical observation bed. - We will continue antibiotic therapy with IV Rocephin and Zithromax. - Mucolytic therapy and bronchodilator therapy will be provided. - We will follow blood cultures. - O2 protocol will be followed.  Chronic obstructive pulmonary disease (COPD) (HCC) - The patient has no current exacerbation. - We will continue his inhalers.  Essential hypertension - We will continue his Cozaar.  Dyslipidemia - We will continue statin therapy.  GERD without esophagitis - We will continue PPI therapy.  BPH (benign prostatic hyperplasia) - We will continue Flomax.   DVT prophylaxis: Lovenox.  Advanced Care Planning:  Code Status: full code.  Family Communication:  The plan of care was discussed in details with the patient (and family). I answered all questions. The patient agreed to proceed with the above mentioned plan. Further management will depend upon hospital course. Disposition Plan: Back to previous home environment Consults called: none.  All the records are reviewed and case discussed with ED provider.  Status is: Observation  I certify that at the time of admission, it is my clinical judgment that the patient will require  hospital care extending less than 2 midnights.                            Dispo: The patient is from: Home              Anticipated d/c is to: Home              Patient currently is not medically stable to d/c.              Difficult to place patient: No  Hannah Beat M.D on 12/20/2022 at 3:40 AM  [facility]   From 7 PM-7 AM, contact  night-coverage [email or web address]  CC: Primary care physician; Ruthell Rummage, PA-C

## 2022-12-19 NOTE — ED Notes (Signed)
Pt placed on 2lt Swartz for SATS of 89%

## 2022-12-19 NOTE — Progress Notes (Signed)
   Patient Name: Adrian Rodgers, Adrian Rodgers DOB: 1941/03/14 MRN: [id number] Transferring facility: [facility] Requesting provider: Doran Durand, md Reason for transfer:  82 yo AAM with weakness/fatigue. hx of HTN, prior hx of CVA with chronic gait imbalance. today had fever of 102 at home. too weak at home. EMS called.  LLL PNA on CXR. WBC 10.6, not hypoxic.  Going to: [facility] Admission Status: observation Bed Type: med/surg To Do:  [facility] will assume care on arrival to accepting facility. Until arrival, medical decision making responsibilities remain with the EDP.  However, [facility] available 24/7 for questions and assistance.   Nursing staff please page [facility] Admits and Consults [phone]) as soon as the patient arrives to the hospital.  Carollee Herter, DO [facility]

## 2022-12-19 NOTE — ED Notes (Signed)
[facility] called for transport @21 :10

## 2022-12-19 NOTE — ED Provider Notes (Signed)
[place] [facility] AT [facility] Provider Note   CSN: [id number] Arrival date & time: 12/19/22  1936     History Chief Complaint  Patient presents with   Weakness    HPI Adrian Rodgers is a 82 y.o. male presenting for chief complaint of altered mental status, weakness, fatigue, cough, fever at home.  Has an extensive medical history including COP,  hyperlipidemia, hypertension, stomach ulcers.  Per family, he has been confused for the past 24 hours has not been eating or drinking has been unable to walk today due to weakness.  Patient's recorded medical, surgical, social, medication list and allergies were reviewed in the Snapshot window as part of the initial history.   Review of Systems   Review of Systems  Constitutional:  Positive for fever. Negative for chills.  HENT:  Negative for ear pain and sore throat.   Eyes:  Negative for pain and visual disturbance.  Respiratory:  Positive for cough. Negative for shortness of breath.   Cardiovascular:  Negative for chest pain and palpitations.  Gastrointestinal:  Negative for abdominal pain and vomiting.  Genitourinary:  Negative for dysuria and hematuria.  Musculoskeletal:  Negative for arthralgias and back pain.  Skin:  Negative for color change and rash.  Neurological:  Negative for seizures and syncope.  All other systems reviewed and are negative.   Physical Exam Updated Vital Signs BP 106/80 (BP Location: Right Arm)   Pulse 86   Temp 99.5 F (37.5 C) (Oral)   Resp 17   Ht 6' (1.829 m)   Wt 90.3 kg   SpO2 93%   BMI 26.99 kg/m  Physical Exam Vitals and nursing note reviewed.  Constitutional:      General: He is not in acute distress.    Appearance: He is well-developed.  HENT:     Head: Normocephalic and atraumatic.  Eyes:     Conjunctiva/sclera: Conjunctivae normal.  Cardiovascular:     Rate and Rhythm: Normal rate and regular rhythm.     Heart sounds: No murmur heard. Pulmonary:      Effort: Pulmonary effort is normal. No respiratory distress.     Breath sounds: Normal breath sounds.  Abdominal:     Palpations: Abdomen is soft.     Tenderness: There is no abdominal tenderness.  Musculoskeletal:        General: No swelling.     Cervical back: Neck supple.  Skin:    General: Skin is warm and dry.     Capillary Refill: Capillary refill takes less than 2 seconds.  Neurological:     Mental Status: He is alert.  Psychiatric:        Mood and Affect: Mood normal.      ED Course/ Medical Decision Making/ A&P    Procedures Procedures   Medications Ordered in ED Medications  atorvastatin (LIPITOR) tablet 80 mg (has no administration in time range)  losartan (COZAAR) tablet 25 mg (has no administration in time range)  nicotine (NICODERM CQ - dosed in mg/24 hours) patch 14 mg (has no administration in time range)  pantoprazole (PROTONIX) EC tablet 20 mg (has no administration in time range)  polyethylene glycol (MIRALAX / GLYCOLAX) packet 17 g (has no administration in time range)  tamsulosin (FLOMAX) capsule 0.4 mg (has no administration in time range)  clopidogrel (PLAVIX) tablet 75 mg (has no administration in time range)  ferrous sulfate EC tablet 325 mg (has no administration in time range)  Centrum Silver 50+Men TABS  2 tablet (has no administration in time range)  Budeson-Glycopyrrol-Formoterol 160-9-4.8 MCG/ACT AERO 2 puff (has no administration in time range)  enoxaparin (LOVENOX) injection 40 mg (has no administration in time range)  0.9 %  sodium chloride infusion (has no administration in time range)  acetaminophen (TYLENOL) tablet 650 mg (has no administration in time range)    Or  acetaminophen (TYLENOL) suppository 650 mg (has no administration in time range)  traZODone (DESYREL) tablet 25 mg (has no administration in time range)  magnesium hydroxide (MILK OF MAGNESIA) suspension 30 mL (has no administration in time range)  ondansetron (ZOFRAN)  tablet 4 mg (has no administration in time range)    Or  ondansetron (ZOFRAN) injection 4 mg (has no administration in time range)  cefTRIAXone (ROCEPHIN) 2 g in sodium chloride 0.9 % 100 mL IVPB (has no administration in time range)  azithromycin (ZITHROMAX) 500 mg in sodium chloride 0.9 % 250 mL IVPB (has no administration in time range)  ipratropium-albuterol (DUONEB) 0.5-2.5 (3) MG/3ML nebulizer solution 3 mL (has no administration in time range)  guaiFENesin (MUCINEX) 12 hr tablet 600 mg (has no administration in time range)  chlorpheniramine-HYDROcodone (TUSSIONEX) 10-8 MG/5ML suspension 5 mL (has no administration in time range)  cefTRIAXone (ROCEPHIN) 1 g in sodium chloride 0.9 % 100 mL IVPB (0 g Intravenous Stopped 12/19/22 2131)  azithromycin (ZITHROMAX) 500 mg in sodium chloride 0.9 % 250 mL IVPB (500 mg Intravenous New Bag/Given 12/19/22 2137)  lactated ringers bolus 1,000 mL (1,000 mLs Intravenous New Bag/Given 12/19/22 2102)    Medical Decision Making:    Adrian Rodgers is a 82 y.o. male who presented to the ED today with fever cough congestion, altered mental status detailed above.     Additional history discussed with patient's family/caregivers.  Patient placed on continuous vitals and telemetry monitoring while in ED which was reviewed periodically.   Complete initial physical exam performed, notably the patient  was tachypneic, dyspneic, hypoxic started on 1 L nasal cannula.      Reviewed and confirmed nursing documentation for past medical history, family history, social history.    Initial Assessment:   With the patient's presentation of fever cough congestion, most likely diagnosis is pneumonia. Other diagnoses were considered including (but not limited to) pneumothorax, empyema, ACS, pulmonary embolism, metabolic crisis. These are considered less likely due to history of present illness and physical exam findings.   This is most consistent with an acute life/limb  threatening illness complicated by underlying chronic conditions.  Initial Plan:  Screening labs including CBC and Metabolic panel to evaluate for infectious or metabolic etiology of disease.  Urinalysis with reflex culture ordered to evaluate for UTI or relevant urologic/nephrologic pathology.  CXR to evaluate for structural/infectious intrathoracic pathology.  EKG to evaluate for cardiac pathology. Viral study to evaluate for infectious pathology Objective evaluation as below reviewed with plan for close reassessment  Initial Study Results:   Laboratory  All laboratory results reviewed without evidence of clinically relevant pathology.     EKG EKG was reviewed independently. Rate, rhythm, axis, intervals all examined and without medically relevant abnormality. ST segments without concerns for elevations.    Radiology  All images reviewed independently. Agree with radiology report at this time.   DG Chest Port 1 View  Result Date: 12/19/2022 CLINICAL DATA:  Respiratory infection that is common for 2 days. Productive cough. EXAM: PORTABLE CHEST 1 VIEW COMPARISON:  CT chest 12/14/2022 and radiographs 01/12/2023 FINDINGS: Stable cardiomegaly. Loop recorder. Bibasilar atelectasis. Hazy  opacity in the left lower lung obscuring the left heart border suspicious for pneumonia. No pleural effusion or pneumothorax. No displaced rib fractures. IMPRESSION: Hazy opacity in the left lower lung suspicious for pneumonia. Electronically Signed   By: Minerva Fester M.D.   On: 12/19/2022 20:13     Consults:  Case discussed with hospitalist.   Final Assessment and Plan:   Patient with acute toxic respiratory failure secondary to pneumonia started on broad coverage with ceftriaxone azithromycin arrange for admission for further care and management.   Disposition:   Based on the above findings, I believe this patient is stable for admission.    Patient/family educated about specific findings on our  evaluation and explained exact reasons for admission.  Patient/family educated about clinical situation and time was allowed to answer questions.   Admission team communicated with and agreed with need for admission. Patient admitted. Patient ready to move at this time.     Emergency Department Medication Summary:   Medications  atorvastatin (LIPITOR) tablet 80 mg (has no administration in time range)  losartan (COZAAR) tablet 25 mg (has no administration in time range)  nicotine (NICODERM CQ - dosed in mg/24 hours) patch 14 mg (has no administration in time range)  pantoprazole (PROTONIX) EC tablet 20 mg (has no administration in time range)  polyethylene glycol (MIRALAX / GLYCOLAX) packet 17 g (has no administration in time range)  tamsulosin (FLOMAX) capsule 0.4 mg (has no administration in time range)  clopidogrel (PLAVIX) tablet 75 mg (has no administration in time range)  ferrous sulfate EC tablet 325 mg (has no administration in time range)  Centrum Silver 50+Men TABS 2 tablet (has no administration in time range)  Budeson-Glycopyrrol-Formoterol 160-9-4.8 MCG/ACT AERO 2 puff (has no administration in time range)  enoxaparin (LOVENOX) injection 40 mg (has no administration in time range)  0.9 %  sodium chloride infusion (has no administration in time range)  acetaminophen (TYLENOL) tablet 650 mg (has no administration in time range)    Or  acetaminophen (TYLENOL) suppository 650 mg (has no administration in time range)  traZODone (DESYREL) tablet 25 mg (has no administration in time range)  magnesium hydroxide (MILK OF MAGNESIA) suspension 30 mL (has no administration in time range)  ondansetron (ZOFRAN) tablet 4 mg (has no administration in time range)    Or  ondansetron (ZOFRAN) injection 4 mg (has no administration in time range)  cefTRIAXone (ROCEPHIN) 2 g in sodium chloride 0.9 % 100 mL IVPB (has no administration in time range)  azithromycin (ZITHROMAX) 500 mg in sodium chloride  0.9 % 250 mL IVPB (has no administration in time range)  ipratropium-albuterol (DUONEB) 0.5-2.5 (3) MG/3ML nebulizer solution 3 mL (has no administration in time range)  guaiFENesin (MUCINEX) 12 hr tablet 600 mg (has no administration in time range)  chlorpheniramine-HYDROcodone (TUSSIONEX) 10-8 MG/5ML suspension 5 mL (has no administration in time range)  cefTRIAXone (ROCEPHIN) 1 g in sodium chloride 0.9 % 100 mL IVPB (0 g Intravenous Stopped 12/19/22 2131)  azithromycin (ZITHROMAX) 500 mg in sodium chloride 0.9 % 250 mL IVPB (500 mg Intravenous New Bag/Given 12/19/22 2137)  lactated ringers bolus 1,000 mL (1,000 mLs Intravenous New Bag/Given 12/19/22 2102)         Clinical Impression:  1. Pneumonia of left lower lobe due to infectious organism      Admit   Final Clinical Impression(s) / ED Diagnoses Final diagnoses:  Pneumonia of left lower lobe due to infectious organism    Rx / DC  Orders ED Discharge Orders     None         Glyn Ade, MD 12/19/22 2348

## 2022-12-20 DIAGNOSIS — J189 Pneumonia, unspecified organism: Secondary | ICD-10-CM | POA: Diagnosis present

## 2022-12-20 DIAGNOSIS — Z8249 Family history of ischemic heart disease and other diseases of the circulatory system: Secondary | ICD-10-CM | POA: Diagnosis not present

## 2022-12-20 DIAGNOSIS — N400 Enlarged prostate without lower urinary tract symptoms: Secondary | ICD-10-CM | POA: Insufficient documentation

## 2022-12-20 DIAGNOSIS — R2689 Other abnormalities of gait and mobility: Secondary | ICD-10-CM | POA: Diagnosis present

## 2022-12-20 DIAGNOSIS — Z716 Tobacco abuse counseling: Secondary | ICD-10-CM | POA: Diagnosis not present

## 2022-12-20 DIAGNOSIS — K219 Gastro-esophageal reflux disease without esophagitis: Secondary | ICD-10-CM | POA: Diagnosis not present

## 2022-12-20 DIAGNOSIS — D649 Anemia, unspecified: Secondary | ICD-10-CM

## 2022-12-20 DIAGNOSIS — I10 Essential (primary) hypertension: Secondary | ICD-10-CM | POA: Diagnosis present

## 2022-12-20 DIAGNOSIS — Z8711 Personal history of peptic ulcer disease: Secondary | ICD-10-CM | POA: Diagnosis not present

## 2022-12-20 DIAGNOSIS — J441 Chronic obstructive pulmonary disease with (acute) exacerbation: Secondary | ICD-10-CM

## 2022-12-20 DIAGNOSIS — A419 Sepsis, unspecified organism: Secondary | ICD-10-CM | POA: Diagnosis present

## 2022-12-20 DIAGNOSIS — J439 Emphysema, unspecified: Secondary | ICD-10-CM | POA: Diagnosis present

## 2022-12-20 DIAGNOSIS — E7800 Pure hypercholesterolemia, unspecified: Secondary | ICD-10-CM | POA: Diagnosis present

## 2022-12-20 DIAGNOSIS — Z79899 Other long term (current) drug therapy: Secondary | ICD-10-CM | POA: Diagnosis not present

## 2022-12-20 DIAGNOSIS — Z8673 Personal history of transient ischemic attack (TIA), and cerebral infarction without residual deficits: Secondary | ICD-10-CM | POA: Diagnosis not present

## 2022-12-20 DIAGNOSIS — R0902 Hypoxemia: Secondary | ICD-10-CM | POA: Diagnosis present

## 2022-12-20 DIAGNOSIS — F17210 Nicotine dependence, cigarettes, uncomplicated: Secondary | ICD-10-CM | POA: Diagnosis present

## 2022-12-20 DIAGNOSIS — I445 Left posterior fascicular block: Secondary | ICD-10-CM | POA: Diagnosis present

## 2022-12-20 DIAGNOSIS — J440 Chronic obstructive pulmonary disease with acute lower respiratory infection: Secondary | ICD-10-CM | POA: Diagnosis present

## 2022-12-20 DIAGNOSIS — Z720 Tobacco use: Secondary | ICD-10-CM

## 2022-12-20 DIAGNOSIS — I69998 Other sequelae following unspecified cerebrovascular disease: Secondary | ICD-10-CM | POA: Diagnosis not present

## 2022-12-20 DIAGNOSIS — Z1152 Encounter for screening for COVID-19: Secondary | ICD-10-CM | POA: Diagnosis not present

## 2022-12-20 DIAGNOSIS — R4182 Altered mental status, unspecified: Secondary | ICD-10-CM | POA: Diagnosis present

## 2022-12-20 DIAGNOSIS — Z7902 Long term (current) use of antithrombotics/antiplatelets: Secondary | ICD-10-CM | POA: Diagnosis not present

## 2022-12-20 DIAGNOSIS — I700 Atherosclerosis of aorta: Secondary | ICD-10-CM | POA: Diagnosis present

## 2022-12-20 LAB — EXPECTORATED SPUTUM ASSESSMENT W GRAM STAIN, RFLX TO RESP C

## 2022-12-20 LAB — CULTURE, BLOOD (ROUTINE X 2)

## 2022-12-20 LAB — CBC
HCT: 40.1 % (ref 39.0–52.0)
Hemoglobin: 13.1 g/dL (ref 13.0–17.0)
MCH: 30.8 pg (ref 26.0–34.0)
MCHC: 32.7 g/dL (ref 30.0–36.0)
MCV: 94.1 fL (ref 80.0–100.0)
Platelets: 322 10*3/uL (ref 150–400)
RBC: 4.26 MIL/uL (ref 4.22–5.81)
RDW: 15.5 % (ref 11.5–15.5)
WBC: 8.2 10*3/uL (ref 4.0–10.5)
nRBC: 0 % (ref 0.0–0.2)

## 2022-12-20 LAB — RESPIRATORY PANEL BY PCR

## 2022-12-20 LAB — URINALYSIS, ROUTINE W REFLEX MICROSCOPIC
Bilirubin Urine: NEGATIVE
Glucose, UA: NEGATIVE mg/dL
Hgb urine dipstick: NEGATIVE
Ketones, ur: 20 mg/dL — AB
Nitrite: NEGATIVE
Protein, ur: NEGATIVE mg/dL
Specific Gravity, Urine: 1.011 (ref 1.005–1.030)
pH: 6 (ref 5.0–8.0)

## 2022-12-20 LAB — BASIC METABOLIC PANEL
Anion gap: 11 (ref 5–15)
BUN: 8 mg/dL (ref 8–23)
CO2: 23 mmol/L (ref 22–32)
Calcium: 8.3 mg/dL — ABNORMAL LOW (ref 8.9–10.3)
Chloride: 103 mmol/L (ref 98–111)
Creatinine, Ser: 0.94 mg/dL (ref 0.61–1.24)
GFR, Estimated: >60 mL/min (ref >60)
Glucose, Bld: 78 mg/dL (ref 70–99)
Potassium: 3.6 mmol/L (ref 3.5–5.1)
Sodium: 137 mmol/L (ref 135–145)

## 2022-12-20 LAB — PROCALCITONIN: Procalcitonin: <0.1 ng/mL

## 2022-12-20 LAB — CULTURE, RESPIRATORY W GRAM STAIN

## 2022-12-20 MED ORDER — FLUTICASONE FUROATE-VILANTEROL 200-25 MCG/ACT IN AEPB
1.0000 | INHALATION_SPRAY | Freq: Every day | RESPIRATORY_TRACT | Status: DC
  Administered 2022-12-20 – 2022-12-21 (×2): 1 via RESPIRATORY_TRACT
  Filled 2022-12-20: qty 28

## 2022-12-20 MED ORDER — METHYLPREDNISOLONE SODIUM SUCC 40 MG IJ SOLR
40.0000 mg | Freq: Every day | INTRAMUSCULAR | Status: DC
  Administered 2022-12-20: 40 mg via INTRAVENOUS
  Filled 2022-12-20: qty 1

## 2022-12-20 MED ORDER — IPRATROPIUM-ALBUTEROL 0.5-2.5 (3) MG/3ML IN SOLN
3.0000 mL | Freq: Two times a day (BID) | RESPIRATORY_TRACT | Status: DC
  Administered 2022-12-20 – 2022-12-21 (×2): 3 mL via RESPIRATORY_TRACT
  Filled 2022-12-20 (×2): qty 3

## 2022-12-20 MED ORDER — UMECLIDINIUM BROMIDE 62.5 MCG/ACT IN AEPB
1.0000 | INHALATION_SPRAY | Freq: Every day | RESPIRATORY_TRACT | Status: DC
  Administered 2022-12-20 – 2022-12-21 (×2): 1 via RESPIRATORY_TRACT
  Filled 2022-12-20: qty 7

## 2022-12-20 MED ORDER — IPRATROPIUM-ALBUTEROL 0.5-2.5 (3) MG/3ML IN SOLN: 3.0000 mL | RESPIRATORY_TRACT | Status: DC | PRN

## 2022-12-20 NOTE — Assessment & Plan Note (Signed)
-   We will continue Flomax 

## 2022-12-20 NOTE — Assessment & Plan Note (Signed)
-   The patient has no current exacerbation. - We will continue his inhalers.

## 2022-12-20 NOTE — Assessment & Plan Note (Signed)
-   The patient will be admitted to a medical observation bed. - We will continue antibiotic therapy with IV Rocephin and Zithromax. - Mucolytic therapy and bronchodilator therapy will be provided. - We will follow blood cultures. - O2 protocol will be followed.

## 2022-12-20 NOTE — Care Management Obs Status (Cosign Needed)
MEDICARE OBSERVATION STATUS NOTIFICATION   Patient Details  Name: Adrian Rodgers MRN: [id number] Date of Birth: 01-Feb-1941   Medicare Observation Status Notification Given:  Yes    Janae Bridgeman, RN 12/20/2022, 2:09 PM

## 2022-12-20 NOTE — Progress Notes (Addendum)
Patient is resting in bed with normal respirations. Daughter requested that patient remained on 2L of [place] because father is noncompliant and can have moments of wheezing. I explained to daughter that he ambulated without oxygen and tolerated well. She deeply expressed that he remains on it at night. Night nurse notified.   Lawana Pai, RN

## 2022-12-20 NOTE — Assessment & Plan Note (Signed)
-   We will continue statin therapy. 

## 2022-12-20 NOTE — Progress Notes (Signed)
PROGRESS NOTE  Adrian Rodgers WNU:[id number] DOB: May 15, 1941   PCP: Ruthell Rummage, PA-C  Patient is from: Home.  Lives with his wife.  Independently ambulates at baseline.  DOA: 12/19/2022 LOS: 0  Chief complaints Chief Complaint  Patient presents with   Weakness     Brief Narrative / Interim history: 82 year old M with PMH of COPD, CVA, gastric ulcer, H. pylori, tobacco use disorder, BPH and hyperlipidemia presenting with progressive shortness of breath, productive cough with yellowish phlegm, wheezing, subjective fever and chills for 2 days, and admitted for LLL pneumonia.  Tachycardic and tachypneic.  COVID-19, influenza and RSV PCR nonreactive.  EKG featured sinus rhythm at 99 and LPFB.  Portable CXR with LLL infiltrate.  Blood cultures obtained.  Started on CTX, Zithromax, IV fluid and admitted for further management.    Subjective: Seen and examined earlier this morning.  No major events overnight of this morning.  Reports feeling better from breathing standpoint but still with significant productive cough.  Denies chest pain.   Objective: Vitals:   12/20/22 0523 12/20/22 0811 12/20/22 0815 12/20/22 1102  BP: (!) 154/64  (!) 140/76 135/78  Pulse: 72  81 79  Resp: 14  18   Temp: 99.3 F (37.4 C)  98.1 F (36.7 C)   TempSrc:   Oral   SpO2: 95% 98% 95%   Weight:      Height:        Examination:  GENERAL: No apparent distress.  Nontoxic. HEENT: MMM.  Vision and hearing grossly intact.  NECK: Supple.  No apparent JVD.  RESP:  No IWOB.  Rhonchi.  Frequent cough during exam. CVS:  RRR. Heart sounds normal.  ABD/GI/GU: BS+. Abd soft, NTND.  MSK/EXT:  Moves extremities. No apparent deformity. No edema.  SKIN: no apparent skin lesion or wound NEURO: Awake, alert and oriented appropriately.  No apparent focal neuro deficit. PSYCH: Calm. Normal affect.   Procedures:  None  Microbiology summarized: COVID-19, influenza and RSV PCR nonreactive Blood cultures  NGTD  Assessment and plan: Principal Problem:   LLL pneumonia Active Problems:   COPD with acute exacerbation (HCC)   History of CVA (cerebrovascular accident)   Essential hypertension   Dyslipidemia   Tobacco abuse   Anemia   BPH (benign prostatic hyperplasia)   GERD without esophagitis   Sepsis due to pneumonia (HCC)  Sepsis due to LLL pneumonia: POA.  Presents with progressive SOB, productive cough, wheezing, subjective fever and chills.  Heart tachycardia, tachypnea and mild leukocytosis on presentation.  CXR with LLL infiltrate.  Chronic smoker.  COVID-19, influenza and RSV PCR nonreactive.  Blood cultures NGTD.  Sepsis physiology resolving.  Still with significant cough and rhonchi on exam. -Continue CTX and Zithromax -Add Solu-Medrol -Continue bronchodilators -Incentive spirometry, OOB and ambulatory saturation -Encouraged smoking cessation.  COPD exacerbation: Hide cardinal symptoms of presentation.  Likely due to pneumonia and smoking cigarettes.  On Breztri Aerosphere at home. -Management as above.  Essential hypertension: Normotensive -Continue home Cosopt -Discontinue IV fluid   Dyslipidemia -Continue home statin   GERD without esophagitis/history of continue/H. pylori -Continue PPI  History of CVA: -Continue home Plavix and statin.   BPH (benign prostatic hyperplasia) -Continue Flomax  Tobacco use disorder: Reports smoking about half a pack a day. -Increase to smoking cessation -Nicotine patch  Body mass index is 26.99 kg/m.           DVT prophylaxis:  enoxaparin (LOVENOX) injection 40 mg Start: 12/20/22 1000  Code Status: Full code  Family Communication: None at bedside Level of care: Med-Surg Status is: Observation The patient will require care spanning > 2 midnights and should be moved to inpatient because: Sepsis due to LLL pneumonia, COPD exacerbation   Final disposition: Home Consultants:  None  55 minutes with more than 50% spent  in reviewing records, counseling patient/family and coordinating care.   Sch Meds:  Scheduled Meds:  atorvastatin  80 mg Oral Daily   clopidogrel  75 mg Oral Daily   enoxaparin (LOVENOX) injection  40 mg Subcutaneous Q24H   ferrous sulfate  325 mg Oral Q breakfast   fluticasone furoate-vilanterol  1 puff Inhalation Daily   And   umeclidinium bromide  1 puff Inhalation Daily   guaiFENesin  600 mg Oral BID   ipratropium-albuterol  3 mL Nebulization BID   losartan  25 mg Oral Daily   methylPREDNISolone (SOLU-MEDROL) injection  40 mg Intravenous Daily   multivitamin with minerals  2 tablet Oral Q breakfast   nicotine  14 mg Transdermal Daily   pantoprazole  20 mg Oral Daily   polyethylene glycol  17 g Oral Daily   tamsulosin  0.4 mg Oral Daily   Continuous Infusions:  azithromycin     cefTRIAXone (ROCEPHIN)  IV 2 g (12/20/22 1111)   PRN Meds:.acetaminophen **OR** acetaminophen, chlorpheniramine-HYDROcodone, ipratropium-albuterol, magnesium hydroxide, ondansetron **OR** ondansetron (ZOFRAN) IV, traZODone  Antimicrobials: Anti-infectives (From admission, onward)    Start     Dose/Rate Route Frequency Ordered Stop   12/20/22 2000  azithromycin (ZITHROMAX) 500 mg in sodium chloride 0.9 % 250 mL IVPB        500 mg 250 mL/hr over 60 Minutes Intravenous Every 24 hours 12/19/22 2341 12/25/22 1959   12/20/22 1000  cefTRIAXone (ROCEPHIN) 2 g in sodium chloride 0.9 % 100 mL IVPB        2 g 200 mL/hr over 30 Minutes Intravenous Every 24 hours 12/19/22 2341 12/25/22 0959   12/19/22 2045  cefTRIAXone (ROCEPHIN) 1 g in sodium chloride 0.9 % 100 mL IVPB        1 g 200 mL/hr over 30 Minutes Intravenous  Once 12/19/22 2040 12/19/22 2131   12/19/22 2045  azithromycin (ZITHROMAX) 500 mg in sodium chloride 0.9 % 250 mL IVPB        500 mg 250 mL/hr over 60 Minutes Intravenous  Once 12/19/22 2040 12/19/22 2237        I have personally reviewed the following labs and images: CBC: Recent Labs   Lab 12/13/22 2319 12/19/22 1945 12/20/22 0621  WBC 8.7 10.6* 8.2  NEUTROABS 7.4  --   --   HGB 14.0 13.7 13.1  HCT 43.2 41.7 40.1  MCV 92.1 92.7 94.1  PLT 409* 383 322   BMP &GFR Recent Labs  Lab 12/13/22 2319 12/19/22 1945 12/20/22 0621  NA 136 135 137  K 3.7 3.9 3.6  CL 104 100 103  CO2 23 21* 23  GLUCOSE 141* 99 78  BUN 20 14 8   CREATININE 0.88 1.09 0.94  CALCIUM 8.6* 8.8* 8.3*   Estimated Creatinine Clearance: 67.6 mL/min (by C-G formula based on SCr of 0.94 mg/dL). Liver & Pancreas: Recent Labs  Lab 12/13/22 2319  AST 14*  ALT 22  ALKPHOS 75  BILITOT 0.8  PROT 7.3  ALBUMIN 4.0   Recent Labs  Lab 12/13/22 2319  LIPASE 40   No results for input(s): "AMMONIA" in the last 168 hours. Diabetic: No results for input(s): "HGBA1C" in the last 72  hours. Recent Labs  Lab 12/19/22 2001  GLUCAP 85   Cardiac Enzymes: No results for input(s): "CKTOTAL", "CKMB", "CKMBINDEX", "TROPONINI" in the last 168 hours. No results for input(s): "PROBNP" in the last 8760 hours. Coagulation Profile: No results for input(s): "INR", "PROTIME" in the last 168 hours. Thyroid Function Tests: No results for input(s): "TSH", "T4TOTAL", "FREET4", "T3FREE", "THYROIDAB" in the last 72 hours. Lipid Profile: No results for input(s): "CHOL", "HDL", "LDLCALC", "TRIG", "CHOLHDL", "LDLDIRECT" in the last 72 hours. Anemia Panel: No results for input(s): "VITAMINB12", "FOLATE", "FERRITIN", "TIBC", "IRON", "RETICCTPCT" in the last 72 hours. Urine analysis:    Component Value Date/Time   COLORURINE YELLOW 04/08/2022 2150   APPEARANCEUR CLEAR 04/08/2022 2150   LABSPEC 1.013 04/08/2022 2150   PHURINE 6.0 04/08/2022 2150   GLUCOSEU NEGATIVE 04/08/2022 2150   HGBUR NEGATIVE 04/08/2022 2150   BILIRUBINUR NEGATIVE 04/08/2022 2150   KETONESUR NEGATIVE 04/08/2022 2150   PROTEINUR NEGATIVE 04/08/2022 2150   NITRITE NEGATIVE 04/08/2022 2150   LEUKOCYTESUR TRACE (A) 04/08/2022 2150   Sepsis  Labs: Invalid input(s): "PROCALCITONIN", "LACTICIDVEN"  Microbiology: Recent Results (from the past 240 hour(s))  Resp panel by RT-PCR (RSV, Flu A&B, Covid) Anterior Nasal Swab     Status: None   Collection Time: 12/19/22  7:45 PM   Specimen: Anterior Nasal Swab  Result Value Ref Range Status   SARS Coronavirus 2 by RT PCR NEGATIVE NEGATIVE Final    Comment: (NOTE) SARS-CoV-2 target nucleic acids are NOT DETECTED.  The SARS-CoV-2 RNA is generally detectable in upper respiratory specimens during the acute phase of infection. The lowest concentration of SARS-CoV-2 viral copies this assay can detect is 138 copies/mL. A negative result does not preclude SARS-Cov-2 infection and should not be used as the sole basis for treatment or other patient management decisions. A negative result may occur with  improper specimen collection/handling, submission of specimen other than nasopharyngeal swab, presence of viral mutation(s) within the areas targeted by this assay, and inadequate number of viral copies(<138 copies/mL). A negative result must be combined with clinical observations, patient history, and epidemiological information. The expected result is Negative.  Fact Sheet for Patients:  BloggerCourse.com  Fact Sheet for Healthcare Providers:  SeriousBroker.it  This test is no t yet approved or cleared by the [place] FDA and  has been authorized for detection and/or diagnosis of SARS-CoV-2 by FDA under an Emergency Use Authorization (EUA). This EUA will remain  in effect (meaning this test can be used) for the duration of the COVID-19 declaration under Section 564(b)(1) of the Act, 21 U.S.C.section 360bbb-3(b)(1), unless the authorization is terminated  or revoked sooner.       Influenza A by PCR NEGATIVE NEGATIVE Final   Influenza B by PCR NEGATIVE NEGATIVE Final    Comment: (NOTE) The Xpert Xpress SARS-CoV-2/FLU/RSV plus  assay is intended as an aid in the diagnosis of influenza from Nasopharyngeal swab specimens and should not be used as a sole basis for treatment. Nasal washings and aspirates are unacceptable for Xpert Xpress SARS-CoV-2/FLU/RSV testing.  Fact Sheet for Patients: BloggerCourse.com  Fact Sheet for Healthcare Providers: SeriousBroker.it  This test is not yet approved or cleared by the [place] FDA and has been authorized for detection and/or diagnosis of SARS-CoV-2 by FDA under an Emergency Use Authorization (EUA). This EUA will remain in effect (meaning this test can be used) for the duration of the COVID-19 declaration under Section 564(b)(1) of the Act, 21 U.S.C. section 360bbb-3(b)(1), unless the authorization is terminated  or revoked.     Resp Syncytial Virus by PCR NEGATIVE NEGATIVE Final    Comment: (NOTE) Fact Sheet for Patients: BloggerCourse.com  Fact Sheet for Healthcare Providers: SeriousBroker.it  This test is not yet approved or cleared by the [place] FDA and has been authorized for detection and/or diagnosis of SARS-CoV-2 by FDA under an Emergency Use Authorization (EUA). This EUA will remain in effect (meaning this test can be used) for the duration of the COVID-19 declaration under Section 564(b)(1) of the Act, 21 U.S.C. section 360bbb-3(b)(1), unless the authorization is terminated or revoked.  Performed at [facility], [address] [place], [place] [address]   Blood culture (routine x 2)     Status: None (Preliminary result)   Collection Time: 12/19/22  8:40 PM   Specimen: BLOOD  Result Value Ref Range Status   Specimen Description   Final    BLOOD LEFT ANTECUBITAL Performed at [facility], [address] [place], [place] [address]    Special Requests   Final    BOTTLES DRAWN AEROBIC AND ANAEROBIC Blood Culture  adequate volume Performed at [facility], [address] [place], [place] [address]    Culture   Final    NO GROWTH < 12 HOURS Performed at [facility], [address], [place], [place] [address]    Report Status PENDING  Incomplete  Blood culture (routine x 2)     Status: None (Preliminary result)   Collection Time: 12/19/22  8:45 PM   Specimen: BLOOD  Result Value Ref Range Status   Specimen Description   Final    BLOOD RIGHT ANTECUBITAL Performed at [facility], [address] [place], [place] [address]    Special Requests   Final    BOTTLES DRAWN AEROBIC AND ANAEROBIC Blood Culture adequate volume Performed at [facility], [address] [place], [place] [address]    Culture   Final    NO GROWTH < 12 HOURS Performed at [facility], [address] [place], [place] [address]    Report Status PENDING  Incomplete  Expectorated Sputum Assessment w Gram Stain, Rflx to Resp Cult     Status: None   Collection Time: 12/19/22  9:35 PM   Specimen: Sputum  Result Value Ref Range Status   Specimen Description   Final    SPUTUM Performed at [facility], [address] [place], [place] [address]    Special Requests   Final    NONE Performed at [facility], [address] [place], [place] [address]    Sputum evaluation   Final    THIS SPECIMEN IS ACCEPTABLE FOR SPUTUM CULTURE Performed at [facility], [address] [place], [place] [address]    Report Status 12/20/2022 FINAL  Final  Culture, Respiratory w Gram Stain     Status: None (Preliminary result)   Collection Time: 12/19/22  9:35 PM   Specimen: SPU  Result Value Ref Range Status   Specimen Description SPUTUM  Final   Special Requests NONE Reflexed from [id number]  Final   Gram Stain   Final    FEW SQUAMOUS EPITHELIAL CELLS PRESENT MODERATE WBC PRESENT,BOTH PMN AND MONONUCLEAR FEW GRAM POSITIVE COCCI IN CHAINS FEW GRAM NEGATIVE  RODS Performed at [facility], [address], [place], [place] [address]    Culture PENDING  Incomplete   Report Status PENDING  Incomplete    Radiology Studies: DG Chest Port 1 View  Result Date: 12/19/2022 CLINICAL DATA:  Respiratory infection that is common for 2 days. Productive cough. EXAM: PORTABLE CHEST 1 VIEW COMPARISON:  CT chest 12/14/2022 and radiographs 01/12/2023 FINDINGS: Stable cardiomegaly. Loop recorder. Bibasilar atelectasis. Hazy opacity in the left lower lung obscuring the left heart border suspicious for pneumonia. No pleural effusion or pneumothorax. No displaced rib fractures. IMPRESSION: Hazy opacity in the left lower lung suspicious for pneumonia. Electronically Signed   By: Minerva Fester M.D.   On: 12/19/2022 20:13      Malakie Balis T. Jerzee Jerome [facility]  If 7PM-7AM, please contact night-coverage [email or web address] 12/20/2022, 11:22 AM

## 2022-12-20 NOTE — Plan of Care (Signed)
Patient is resting in bed with normal vitals and respirations. Patient ambulated to bathroom and cleaned himself and tolerated it well. Patient sat up while eating breakfast and dinner. No complaint of pain or distress noted or verbalized. No O2 was needed. O2 nasal cannula is at bedside. Plan of care is ongoing.   Problem: Education: Goal: Knowledge of General Education information will improve Description: Including pain rating scale, medication(s)/side effects and non-pharmacologic comfort measures Outcome: Progressing   Problem: Health Behavior/Discharge Planning: Goal: Ability to manage health-related needs will improve Outcome: Progressing   Problem: Clinical Measurements: Goal: Ability to maintain clinical measurements within normal limits will improve Outcome: Progressing Goal: Will remain free from infection Outcome: Progressing Goal: Diagnostic test results will improve Outcome: Progressing Goal: Respiratory complications will improve Outcome: Progressing Goal: Cardiovascular complication will be avoided Outcome: Progressing   Problem: Activity: Goal: Risk for activity intolerance will decrease Outcome: Progressing   Problem: Nutrition: Goal: Adequate nutrition will be maintained Outcome: Progressing   Problem: Coping: Goal: Level of anxiety will decrease Outcome: Progressing   Problem: Elimination: Goal: Will not experience complications related to bowel motility Outcome: Progressing Goal: Will not experience complications related to urinary retention Outcome: Progressing   Problem: Pain Managment: Goal: General experience of comfort will improve Outcome: Progressing   Problem: Safety: Goal: Ability to remain free from injury will improve Outcome: Progressing   Problem: Skin Integrity: Goal: Risk for impaired skin integrity will decrease Outcome: Progressing   Problem: Activity: Goal: Ability to tolerate increased activity will improve Outcome:  Progressing   Problem: Clinical Measurements: Goal: Ability to maintain a body temperature in the normal range will improve Outcome: Progressing   Problem: Respiratory: Goal: Ability to maintain adequate ventilation will improve Outcome: Progressing Goal: Ability to maintain a clear airway will improve Outcome: Progressing

## 2022-12-20 NOTE — TOC Initial Note (Signed)
Transition of Care [facility]) - Initial/Assessment Note    Patient Details  Name: Adrian Rodgers MRN: [id number] Date of Birth: 1941/06/08  Transition of Care [facility]) CM/SW Contact:    Janae Bridgeman, RN Phone Number: 12/20/2022, 2:46 PM  Clinical Narrative:                 CM met with the patient and daughter, Patsy Lager at the bedside to discuss TOC needs.  The patient lives alone and was admitted to the hospital for LLL pneumonia.  The patient was provided with Medicare observation letter.  The patient is a current smoking - smoking cessation information was provided.  The patient currently sees a pulmonologist and was placed on home oxygen through [employer] about 1 year ago for home oxygen at 2L/min but patient has been noncompliant and has not been using.  The daughter requested that I reach out to [employer] to have them contact her to set the home equipment back up.  Patient has a concentrator at the home currently.  I called [employer] and notified Mitchel, CM at [employer] and they will reach out to the daughter at the home.  DME at the home includes home oxygen through [employer], [place], RW x 2, WC.  The patient is independent and drives.  The patient has no home health services and is independent at the home.  No needs at this time regarding TOC needs.  Expected Discharge Plan: Home/Self Care Barriers to Discharge: Continued Medical Work up   Patient Goals and CMS Choice Patient states their goals for this hospitalization and ongoing recovery are:: To return home CMS Medicare.gov Compare Post Acute Care list provided to:: Patient Choice offered to / list presented to : Patient [place] ownership interest in [facility].provided to:: Patient    Expected Discharge Plan and Services   Discharge Planning Services: CM Consult Post Acute Care Choice: Resumption of Svcs/PTA Provider Living arrangements for the past 2 months: Single Family Home                     Date DME Agency  Contacted: 12/20/22 Time DME Agency Contacted: [phone] Representative spoke with at DME Agency: [employer] was called to follow up at the home to provide education to the daughter to set home oxygen back up since patient is not using            Prior Living Arrangements/Services Living arrangements for the past 2 months: Single Family Home   Patient language and need for interpreter reviewed:: Yes Do you feel safe going back to the place where you live?: Yes      Need for Family Participation in Patient Care: Yes (Comment) Care giver support system in place?: Yes (comment) Current home services: DME (Home oxygen at 2L/min [place] thru [employer] - (does not wear per daughter), Gilmer Mor, RWx 2 , Wheelchair) Criminal Activity/Legal Involvement Pertinent to Current Situation/Hospitalization: No - Comment as needed  Activities of Daily Living Home Assistive Devices/Equipment: None ADL Screening (condition at time of admission) Patient's cognitive ability adequate to safely complete daily activities?: Yes Is the patient deaf or have difficulty hearing?: No Does the patient have difficulty seeing, even when wearing glasses/contacts?: No Does the patient have difficulty concentrating, remembering, or making decisions?: No Patient able to express need for assistance with ADLs?: No Does the patient have difficulty dressing or bathing?: Yes Independently performs ADLs?: No Communication: Independent Dressing (OT): Needs assistance Grooming: Needs assistance Feeding: Independent Bathing: Needs assistance Toileting: Needs  assistance In/Out Bed: Needs assistance Walks in Home: Needs assistance Does the patient have difficulty walking or climbing stairs?: Yes Weakness of Legs: Both Weakness of Arms/Hands: Both  Permission Sought/Granted Permission sought to share information with : Case Manager Permission granted to share information with : Yes, Verbal Permission Granted     Permission granted to share info  w AGENCY: [employer] - Called [employer] to reach out to the daughter to set home oxygen back up  Permission granted to share info w Relationship: daughter Patsy Lager - [phone]     Emotional Assessment Appearance:: Appears stated age Attitude/Demeanor/Rapport: Gracious Affect (typically observed): Accepting Orientation: : Oriented to Self, Oriented to Place, Oriented to  Time, Oriented to Situation Alcohol / Substance Use: Tobacco Use Psych Involvement: No (comment)  Admission diagnosis:  LLL pneumonia [J18.9] Pneumonia of left lower lobe due to infectious organism [J18.9] Sepsis due to pneumonia (HCC) [J18.9, A41.9] Patient Active Problem List   Diagnosis Date Noted   BPH (benign prostatic hyperplasia) 12/20/2022   GERD without esophagitis 12/20/2022   Sepsis due to pneumonia (HCC) 12/20/2022   LLL pneumonia 12/19/2022   GI bleed 04/11/2022   Upper GI bleed 04/10/2022   Acute gastric ulcer with hemorrhage 04/10/2022   Dark stools 04/09/2022   Heme positive stool 04/09/2022   Anemia 04/09/2022   Antiplatelet or antithrombotic long-term use 04/09/2022   Hypotension due to hypovolemia 04/08/2022   SIRS (systemic inflammatory response syndrome) (HCC) 04/08/2022   Acute encephalopathy 04/08/2022   Tobacco abuse 03/27/2022   Acute respiratory failure due to COVID-19 (HCC) 03/25/2022   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   Generalized weakness 03/27/2021   Ascending aortic aneurysm (HCC) 09/22/2020   COPD with acute exacerbation (HCC) 05/18/2020   Essential hypertension 05/13/2020   Dyslipidemia 05/13/2020   History of CVA (cerebrovascular accident) 05/12/2020   PCP:  Ruthell Rummage, PA-C Pharmacy:   [facility] [phone] - [place], [place] - [address] [place] [id number] Phone: [phone] Fax: [phone]     Social Determinants of Health (SDOH) Social History: SDOH Screenings   Food Insecurity: No Food Insecurity  (12/19/2022)  Housing: Low Risk  (12/19/2022)  Transportation Needs: No Transportation Needs (12/19/2022)  Utilities: Not At Risk (12/19/2022)  Tobacco Use: High Risk (12/19/2022)   SDOH Interventions:     Readmission Risk Interventions    12/20/2022    2:41 PM  Readmission Risk Prevention Plan  Transportation Screening Complete  PCP or Specialist Appt within 5-7 Days Complete  Home Care Screening Complete  Medication Review (RN CM) Complete

## 2022-12-20 NOTE — Assessment & Plan Note (Signed)
-   We will continue his Cozaar. 

## 2022-12-20 NOTE — Assessment & Plan Note (Signed)
-   We will continue PPI therapy 

## 2022-12-21 DIAGNOSIS — I10 Essential (primary) hypertension: Secondary | ICD-10-CM | POA: Diagnosis not present

## 2022-12-21 DIAGNOSIS — Z8673 Personal history of transient ischemic attack (TIA), and cerebral infarction without residual deficits: Secondary | ICD-10-CM | POA: Diagnosis not present

## 2022-12-21 DIAGNOSIS — J189 Pneumonia, unspecified organism: Secondary | ICD-10-CM | POA: Diagnosis not present

## 2022-12-21 DIAGNOSIS — N400 Enlarged prostate without lower urinary tract symptoms: Secondary | ICD-10-CM | POA: Diagnosis not present

## 2022-12-21 LAB — RETICULOCYTES
Immature Retic Fract: 12.8 % (ref 2.3–15.9)
RBC.: 4.31 MIL/uL (ref 4.22–5.81)
Retic Count, Absolute: 54.3 10*3/uL (ref 19.0–186.0)
Retic Ct Pct: 1.3 % (ref 0.4–3.1)

## 2022-12-21 LAB — RENAL FUNCTION PANEL
Albumin: 3.1 g/dL — ABNORMAL LOW (ref 3.5–5.0)
Anion gap: 14 (ref 5–15)
BUN: 9 mg/dL (ref 8–23)
CO2: 23 mmol/L (ref 22–32)
Calcium: 8.9 mg/dL (ref 8.9–10.3)
Chloride: 102 mmol/L (ref 98–111)
Creatinine, Ser: 0.84 mg/dL (ref 0.61–1.24)
GFR, Estimated: >60 mL/min (ref >60)
Glucose, Bld: 111 mg/dL — ABNORMAL HIGH (ref 70–99)
Phosphorus: 3.3 mg/dL (ref 2.5–4.6)
Potassium: 4 mmol/L (ref 3.5–5.1)
Sodium: 139 mmol/L (ref 135–145)

## 2022-12-21 LAB — CBC
HCT: 39.4 % (ref 39.0–52.0)
Hemoglobin: 13 g/dL (ref 13.0–17.0)
MCH: 30.6 pg (ref 26.0–34.0)
MCHC: 33 g/dL (ref 30.0–36.0)
MCV: 92.7 fL (ref 80.0–100.0)
Platelets: 359 10*3/uL (ref 150–400)
RBC: 4.25 MIL/uL (ref 4.22–5.81)
RDW: 15.2 % (ref 11.5–15.5)
WBC: 8.1 10*3/uL (ref 4.0–10.5)
nRBC: 0 % (ref 0.0–0.2)

## 2022-12-21 LAB — IRON AND TIBC
Iron: 19 ug/dL — ABNORMAL LOW (ref 45–182)
Saturation Ratios: 7 % — ABNORMAL LOW (ref 17.9–39.5)
TIBC: 284 ug/dL (ref 250–450)
UIBC: 265 ug/dL

## 2022-12-21 LAB — CULTURE, BLOOD (ROUTINE X 2): Special Requests: ADEQUATE

## 2022-12-21 LAB — FERRITIN: Ferritin: 91 ng/mL (ref 24–336)

## 2022-12-21 LAB — VITAMIN B12: Vitamin B-12: 1312 pg/mL — ABNORMAL HIGH (ref 180–914)

## 2022-12-21 LAB — MAGNESIUM: Magnesium: 2.3 mg/dL (ref 1.7–2.4)

## 2022-12-21 LAB — CULTURE, RESPIRATORY W GRAM STAIN

## 2022-12-21 LAB — FOLATE: Folate: 18.8 ng/mL (ref >5.9)

## 2022-12-21 MED ORDER — AZITHROMYCIN 250 MG PO TABS: 250.0000 mg | ORAL_TABLET | Freq: Every day | ORAL | 0 refills | Status: DC

## 2022-12-21 MED ORDER — AMOXICILLIN-POT CLAVULANATE 875-125 MG PO TABS: 1.0000 | ORAL_TABLET | Freq: Two times a day (BID) | ORAL | 0 refills | Status: AC

## 2022-12-21 MED ORDER — METHYLPREDNISOLONE SODIUM SUCC 40 MG IJ SOLR
40.0000 mg | Freq: Every day | INTRAMUSCULAR | Status: DC
  Administered 2022-12-21: 40 mg via INTRAVENOUS
  Filled 2022-12-21: qty 1

## 2022-12-21 MED ORDER — SODIUM CHLORIDE 0.9 % IV SOLN
250.0000 mg | Freq: Every day | INTRAVENOUS | Status: DC
  Administered 2022-12-21: 250 mg via INTRAVENOUS
  Filled 2022-12-21: qty 20

## 2022-12-21 MED ORDER — PREDNISONE 50 MG PO TABS: 50.0000 mg | ORAL_TABLET | Freq: Every day | ORAL | 0 refills | Status: AC

## 2022-12-21 MED ORDER — SODIUM CHLORIDE 0.9 % IV SOLN
2.0000 g | INTRAVENOUS | Status: DC
  Administered 2022-12-21: 2 g via INTRAVENOUS
  Filled 2022-12-21: qty 20

## 2022-12-21 NOTE — Evaluation (Signed)
Occupational Therapy Evaluation Patient Details Name: Adrian Rodgers MRN: [id number] DOB: August 20, 1940 Today's Date: 12/21/2022   History of Present Illness 82 y.o. male presenting 12/19/2022 with progressive shortness of breath, productive cough with yellowish phlegm, wheezing, subjective fever and chills for 2 days. Admitted for LLL pneumonia; CXR LLL inriltrate. PMH significant for  hyperlipidemia, gastric ulcer, H. pylori, hypertension, CVA and tobacco abuse.   Clinical Impression   PTA, pt lived alone and was mod I for ADL and IADl; daughter orders groceries and double checks for correct pillbox set-up/financial management. Upon eval, pt performing grossly at mod I level and supervision A for tub transfer. Basic cognitive screen and WFL. Pt reporting his breathing feels normal, and found to desat to ~88 once during session, but quick recovery. SpO2 also checked and >90 after retrieving items from floor and tub transfer. Energy conservation reviewed. Will follow to optimize endurance but do not suspect any post-acute OT needs.    Recommendations for follow up therapy are one component of a multi-disciplinary discharge planning process, led by the attending physician.  Recommendations may be updated based on patient status, additional functional criteria and insurance authorization.   Assistance Recommended at Discharge Intermittent Supervision/Assistance  Patient can return home with the following Assistance with cooking/housework;Help with stairs or ramp for entrance (as needed)    Functional Status Assessment  Patient has had a recent decline in their functional status and demonstrates the ability to make significant improvements in function in a reasonable and predictable amount of time.  Equipment Recommendations  None recommended by OT    Recommendations for Other Services       Precautions / Restrictions Precautions Precaution Comments: watch SpO2 Restrictions Weight Bearing  Restrictions: No      Mobility Bed Mobility Overal bed mobility: Modified Independent             General bed mobility comments: no assist    Transfers Overall transfer level: Modified independent Equipment used: None               General transfer comment: supervision in session for safety, no concerns      Balance Overall balance assessment: Needs assistance Sitting-balance support: No upper extremity supported, Feet supported Sitting balance-Leahy Scale: Good     Standing balance support: No upper extremity supported, During functional activity Standing balance-Leahy Scale: Fair Standing balance comment: poor tolerance for challenge                           ADL either performed or assessed with clinical judgement   ADL Overall ADL's : Needs assistance/impaired Eating/Feeding: Modified independent   Grooming: Modified independent   Upper Body Bathing: Modified independent   Lower Body Bathing: Modified independent   Upper Body Dressing : Modified independent   Lower Body Dressing: Modified independent   Toilet Transfer: Modified Independent   Toileting- Clothing Manipulation and Hygiene: Modified independent   Tub/ Shower Transfer: Tub transfer;Supervision/safety;Ambulation Tub/Shower Transfer Details (indicate cue type and reason): for safety only         Vision Baseline Vision/History: 0 No visual deficits (reading glasses) Ability to See in Adequate Light: 0 Adequate Patient Visual Report: No change from baseline Vision Assessment?: No apparent visual deficits Additional Comments: able to read OT name badge and signage in room     Perception     Praxis      Pertinent Vitals/Pain Pain Assessment Pain Assessment: No/denies pain     Hand  Dominance Right   Extremity/Trunk Assessment Upper Extremity Assessment Upper Extremity Assessment: Overall WFL for tasks assessed   Lower Extremity Assessment Lower Extremity  Assessment: Defer to PT evaluation   Cervical / Trunk Assessment Cervical / Trunk Assessment: Normal   Communication Communication Communication: No difficulties   Cognition Arousal/Alertness: Awake/alert Behavior During Therapy: WFL for tasks assessed/performed Overall Cognitive Status: Within Functional Limits for tasks assessed                                 General Comments: Able to perform simple 3 min delayed memory recall task, money management, and answer questions in regard to how he knows whether he has already taken his medications. Pt reports daughter orders his grocery pick up and intermittently supervises/checks to ensure proper medication management by checking his pill box after he sets it up.     General Comments  Up in restroom on arrival. Pt SpO2 89% after restroom and washing hands at sink but questionable pleth. All additional times SpO2 checked taking several seconds to read with good pleth, but >90%. Pt with no apparent distress and reporting his breathing feels like his normal    Exercises     Shoulder Instructions      Home Living Family/patient expects to be discharged to:: Private residence Living Arrangements: Alone Available Help at Discharge: Family;Available PRN/intermittently Type of Home: House Home Access: Stairs to enter [employer] of Steps: 4 Entrance Stairs-Rails: Right Home Layout: One level     Bathroom Shower/Tub: Chief Strategy Officer: Standard     Home Equipment: Agricultural consultant (2 wheels);Rollator (4 wheels);Cane - single point;Grab bars - tub/shower;Hospital bed;Wheelchair - manual;Shower seat (reports he does not use shower seat but does have on)   Additional Comments: daughter works from home and often works from the [employer]      Prior Functioning/Environment Prior Level of Function : Independent/Modified Independent;Driving             Mobility Comments: independent, using cane  in community ADLs Comments: independent in ADL, home management and driving. Pt reports daughter orders his grocery pick up and intermittently supervises/checks to ensure proper medication management by checking his pill box after he sets it up.        OT Problem List: Impaired balance (sitting and/or standing);Decreased activity tolerance      OT Treatment/Interventions: Self-care/ADL training;Therapeutic exercise;DME and/or AE instruction;Balance training;Patient/family education;Therapeutic activities    OT Goals(Current goals can be found in the care plan section) Acute Rehab OT Goals Patient Stated Goal: go home OT Goal Formulation: With patient Time For Goal Achievement: 01/04/23 Potential to Achieve Goals: Good  OT Frequency: Min 2X/week    Co-evaluation              AM-PAC OT "6 Clicks" Daily Activity     Outcome Measure Help from another person eating meals?: None Help from another person taking care of personal grooming?: None Help from another person toileting, which includes using toliet, bedpan, or urinal?: None Help from another person bathing (including washing, rinsing, drying)?: None Help from another person to put on and taking off regular upper body clothing?: None Help from another person to put on and taking off regular lower body clothing?: None 6 Click Score: 24   End of Session Equipment Utilized During Treatment: Gait belt Nurse Communication: Mobility status  Activity Tolerance: Patient tolerated treatment well Patient left: in chair;with  call bell/phone within reach  OT Visit Diagnosis: Unsteadiness on feet (R26.81);Muscle weakness (generalized) (M62.81)                Time: 8295-6213 OT Time Calculation (min): 18 min Charges:  OT General Charges $OT Visit: 1 Visit OT Evaluation $OT Eval Low Complexity: 1 Low  Tyler Deis, OTR/L [facility] Acute Rehabilitation Office: [phone]   Myrla Halsted 12/21/2022, 10:26 AM

## 2022-12-21 NOTE — Evaluation (Addendum)
Physical Therapy Evaluation Patient Details Name: Adrian Rodgers MRN: [id number] DOB: [birth year] Today's Date: 12/21/2022  History of Present Illness  82 y.o. male presenting 12/19/2022 with progressive shortness of breath, productive cough with yellowish phlegm, wheezing, subjective fever and chills for 2 days. Admitted for LLL pneumonia; CXR LLL inriltrate. PMH significant for  hyperlipidemia, gastric ulcer, H. pylori, hypertension, CVA and tobacco abuse.   Clinical Impression  Pt in bed upon arrival of PT, agreeable to evaluation at this time. Prior to admission the pt was independent in the home with use if [facility] intermittently for balance. He reports living alone but with frequent visits from his daughter who could stay for a few days as needed. The pt was able to complete bed mobility and transfers without assist and with good stability, then completed ~200 ft hallway ambulation with supervision and intermittent use of hallway rail. Pt with minor increase in work of breathing and 2/4 DOE, but reports this feels close to his baseline. Would benefit from continued endurance training but is safe to return home with family support when medically stable for d/c.   SpO2 on RA at rest: 94%  SpO2 on RA with ambulation: 88-91%   Recommendations for follow up therapy are one component of a multi-disciplinary discharge planning process, led by the attending physician.  Recommendations may be updated based on patient status, additional functional criteria and insurance authorization.  Follow Up Recommendations       Assistance Recommended at Discharge Frequent or constant Supervision/Assistance  Patient can return home with the following  Assistance with cooking/housework;Assist for transportation;Help with stairs or ramp for entrance    Equipment Recommendations None recommended by PT  Recommendations for Other Services       Functional Status Assessment Patient has had a recent decline in their  functional status and demonstrates the ability to make significant improvements in function in a reasonable and predictable amount of time.     Precautions / Restrictions Precautions Precaution Comments: watch SpO2 Restrictions Weight Bearing Restrictions: No      Mobility  Bed Mobility Overal bed mobility: Modified Independent             General bed mobility comments: no assist    Transfers Overall transfer level: Modified independent Equipment used: None               General transfer comment: supervision in session for safety, no concerns    Ambulation/Gait Ambulation/Gait assistance: Supervision Gait Distance (Feet): 200 Feet Assistive device: None (hallway rail in LUE) Gait Pattern/deviations: Step-through pattern, Decreased stride length, Decreased dorsiflexion - right, Decreased dorsiflexion - left Gait velocity: decreased, but pt reports close to baseline Gait velocity interpretation: <1.31 ft/sec, indicative of household ambulator   General Gait Details: smaller stride and clearance with BLE, no overt LOB or need for assistance. Pt with SpO2 to low of 88%, but mostly 89-91% while walking. 2/4 DOE    Balance Overall balance assessment: Needs assistance Sitting-balance support: No upper extremity supported, Feet supported Sitting balance-Leahy Scale: Good     Standing balance support: No upper extremity supported, During functional activity Standing balance-Leahy Scale: Fair Standing balance comment: poor tolerance for challenge                             Pertinent Vitals/Pain Pain Assessment Pain Assessment: No/denies pain    Home Living Family/patient expects to be discharged to:: Private residence Living Arrangements: Alone Available Help at  Discharge: Family;Available PRN/intermittently Type of Home: House Home Access: Stairs to enter Entrance Stairs-Rails: Right Entrance Stairs-Number of Steps: 4   Home Layout: One  level Home Equipment: Agricultural consultant (2 wheels);Rollator (4 wheels);Cane - single point;Grab bars - tub/shower;Hospital bed;Wheelchair - manual Additional Comments: daughter works from home and often works from the [employer]    Prior Function Prior Level of Function : Independent/Modified Independent;Driving             Mobility Comments: independent, using cane in community ADLs Comments: independent     Hand Dominance   Dominant Hand: Right    Extremity/Trunk Assessment   Upper Extremity Assessment Upper Extremity Assessment: Defer to OT evaluation    Lower Extremity Assessment Lower Extremity Assessment: Overall WFL for tasks assessed    Cervical / Trunk Assessment Cervical / Trunk Assessment: Normal  Communication   Communication: No difficulties  Cognition Arousal/Alertness: Awake/alert Behavior During Therapy: WFL for tasks assessed/performed Overall Cognitive Status: Within Functional Limits for tasks assessed                                          General Comments General comments (skin integrity, edema, etc.): SpO2 95% on RA at rest,88-91% with ambulation. 2/4 DOE        Assessment/Plan    PT Assessment Patient needs continued PT services  PT Problem List Cardiopulmonary status limiting activity;Decreased activity tolerance;Decreased balance;Decreased mobility       PT Treatment Interventions DME instruction;Gait training;Stair training;Therapeutic exercise;Therapeutic activities    PT Goals (Current goals can be found in the Care Plan section)  Acute Rehab PT Goals Patient Stated Goal: return home PT Goal Formulation: With patient Time For Goal Achievement: 12/28/22 Potential to Achieve Goals: Good    Frequency Min 3X/week        AM-PAC PT "6 Clicks" Mobility  Outcome Measure Help needed turning from your back to your side while in a flat bed without using bedrails?: None Help needed moving from lying on your back to  sitting on the side of a flat bed without using bedrails?: None Help needed moving to and from a bed to a chair (including a wheelchair)?: None Help needed standing up from a chair using your arms (e.g., wheelchair or bedside chair)?: A Little Help needed to walk in hospital room?: A Little Help needed climbing 3-5 steps with a railing? : A Little 6 Click Score: 21    End of Session Equipment Utilized During Treatment: Gait belt Activity Tolerance: Patient tolerated treatment well Patient left: in chair;with call bell/phone within reach Nurse Communication: Mobility status PT Visit Diagnosis: Other abnormalities of gait and mobility (R26.89)    Time: 4098-1191 PT Time Calculation (min) (ACUTE ONLY): 27 min   Charges:   PT Evaluation $PT Eval Low Complexity: 1 Low PT Treatments $Therapeutic Exercise: 8-22 mins        Vickki Muff, PT, DPT   Acute Rehabilitation Department Office [phone] Secure Chat Communication Preferred  Ronnie Derby 12/21/2022, 8:57 AM

## 2022-12-21 NOTE — Discharge Summary (Signed)
Physician Discharge Summary  Adrian Rodgers ION:[id number] DOB: 21-Nov-1940 DOA: 12/19/2022  PCP: Ruthell Rummage, PA-C  Admit date: 12/19/2022 Discharge date: 12/21/2022 Admitted From: Home Disposition: Home Recommendations for Outpatient Follow-up:  Follow up with PCP in 1 week Encouraged smoking cessation Please follow up on the following pending results: Respiratory culture  Home Health: Not indicated Equipment/Devices: Patient has home oxygen if needed  Discharge Condition: Stable CODE STATUS: Full code  Follow-up Information     [facility], [facility] Follow up.   Why: [facility] will continue to provide home oxygen needs. Contact information: [address] [address] [phone]         Ruthell Rummage, PA-C. Schedule an appointment as soon as possible for a visit in 1 week(s).   Specialty: Internal Medicine Contact information: [address] [place] [address] [phone]                 Hospital course 82 year old M with PMH of COPD, CVA, gastric ulcer, H. pylori, tobacco use disorder, BPH and hyperlipidemia presenting with progressive shortness of breath, productive cough with yellowish phlegm, wheezing, subjective fever and chills for 2 days, and admitted for LLL pneumonia.  Tachycardic and tachypneic.  COVID-19, influenza and RSV PCR nonreactive.  EKG featured sinus rhythm at 99 and LPFB.  Portable CXR with LLL infiltrate.  Blood cultures obtained.  Started on CTX, Zithromax, IV fluid and admitted for further management.   The next day, steroid added.  Full RVP nonreactive.  Blood cultures NGTD.  Continued on ceftriaxone, Zithromax and nebulizers.  On the day of discharge, respiratory symptoms improved.  He feels ready to go home.  Respiratory culture with beta-lactamase negative few haemophilus influenza.  Ambulated on room air and maintained appropriate saturation.  Discharged on p.o. Augmentin,  Zithromax, prednisone and home triple therapy with rescue inhalers.  Encouraged to quit smoking cigarettes.  Provided with resources.  See individual problem list below for more.   Problems addressed during this hospitalization Principal Problem:   LLL pneumonia Active Problems:   COPD with acute exacerbation (HCC)   History of CVA (cerebrovascular accident)   Essential hypertension   Dyslipidemia   Tobacco abuse   Anemia   BPH (benign prostatic hyperplasia)   GERD without esophagitis   Sepsis due to pneumonia (HCC)   Sepsis due to LLL pneumonia: POA.  Presents with progressive SOB, productive cough, wheezing, subjective fever and chills.  Heart tachycardia, tachypnea and mild leukocytosis on presentation.  CXR with LLL infiltrate.  Chronic smoker.  COVID-19, influenza and RSV PCR nonreactive.  Full RVP nonreactive.  Blood cultures NGTD.  Respiratory culture with haemophilus influenza.  Beta-lactamase negative.  Ambulated on room air and maintained appropriate saturation.  Sepsis physiology resolved. -Received 2 doses of CTX, Zithromax and Solu-Medrol.  Discharged on p.o. Augmentin, Zithromax and prednisone for 3 more days. -Continue continue Breztri and rescue inhaler. -Counseled on smoking cessation   COPD exacerbation: Hide cardinal symptoms of presentation.  Likely due to pneumonia and smoking cigarettes.  On Breztri Aerosphere at home. -Management as above.   Essential hypertension: Normotensive -Continue home meds.   Dyslipidemia -Continue home statin   GERD without esophagitis/history of continue/H. pylori -Continue PPI   History of CVA: -Continue home Plavix and statin.   BPH (benign prostatic hyperplasia) -Continue Flomax   Tobacco use disorder: Reports smoking about half a pack a day. -Encouraged smoking cessation.           Time  spent 35 minutes  Vital signs Vitals:   12/21/22 0013 12/21/22 0449 12/21/22 0810 12/21/22 0852  BP: 135/83 (!) 164/87 (!)  155/88   Pulse: 67 (!) 58 75   Temp: 98.1 F (36.7 C) 97.7 F (36.5 C) 97.6 F (36.4 C)   Resp: 16 16 18    Height:      Weight:      SpO2: 94% 98% 92% 94%  TempSrc: Oral Oral    BMI (Calculated):         Discharge exam  GENERAL: No apparent distress.  Nontoxic. HEENT: MMM.  Vision and hearing grossly intact.  NECK: Supple.  No apparent JVD.  RESP:  No IWOB.  Fair aeration bilaterally. CVS:  RRR. Heart sounds normal.  ABD/GI/GU: BS+. Abd soft, NTND.  MSK/EXT:  Moves extremities. No apparent deformity. No edema.  SKIN: no apparent skin lesion or wound NEURO: Awake and alert. Oriented appropriately.  No apparent focal neuro deficit. PSYCH: Calm. Normal affect.   Discharge Instructions Discharge Instructions     Call MD for:  difficulty breathing, headache or visual disturbances   Complete by: As directed    Call MD for:  extreme fatigue   Complete by: As directed    Call MD for:  persistant dizziness or light-headedness   Complete by: As directed    Diet - low sodium heart healthy   Complete by: As directed    Discharge instructions   Complete by: As directed    It has been a pleasure taking care of you!  You were hospitalized due to pneumonia and COPD exacerbation for which you have been started on medication.  Your symptoms improved.  We are discharging you more antibiotics and steroid to complete treatment course.  It is also very important that you use your inhaler as prescribed and quit smoking cigarettes.  Please review your new medication list and the directions on your medications before you take them.  Follow-up with your primary care doctor in 1 to 2 weeks or sooner if needed.  It is important that you quit smoking cigarettes.  You may use nicotine patch to help you quit smoking.  Nicotine patch is available over-the-counter.  You may also discuss other options to help you quit smoking with your primary care doctor. You can also talk to professional counselors at  1-800-QUIT-NOW 860-388-3328) for free smoking cessation counseling.     Take care,   Increase activity slowly   Complete by: As directed       Allergies as of 12/21/2022   No Known Allergies      Medication List     TAKE these medications    acetaminophen 500 MG tablet Commonly known as: TYLENOL Take 1,000 mg by mouth as needed for moderate pain, mild pain, fever or headache.   albuterol 108 (90 Base) MCG/ACT inhaler Commonly known as: VENTOLIN HFA Inhale 2 puffs into the lungs every 6 (six) hours as needed for wheezing or shortness of breath. What changed: when to take this   amoxicillin-clavulanate 875-125 MG tablet Commonly known as: AUGMENTIN Take 1 tablet by mouth 2 (two) times daily for 3 days. Start taking on: December 22, 2022   atorvastatin 80 MG tablet Commonly known as: LIPITOR Take 1 tablet (80 mg total) by mouth daily.   azithromycin 250 MG tablet Commonly known as: Zithromax Take 1 tablet (250 mg total) by mouth daily.   Breztri Aerosphere 160-9-4.8 MCG/ACT Aero Generic drug: Budeson-Glycopyrrol-Formoterol Inhale 2 puffs into the lungs 2 (  two) times daily.   Centrum Silver 50+Men Tabs Take 1 tablet by mouth daily with breakfast.   clopidogrel 75 MG tablet Commonly known as: PLAVIX Take 75 mg by mouth daily.   ferrous sulfate 325 (65 FE) MG EC tablet Take 1 tablet (325 mg total) by mouth daily with breakfast.   losartan 50 MG tablet Commonly known as: COZAAR Take 50 mg by mouth daily.   nicotine 14 mg/24hr patch Commonly known as: NICODERM CQ - dosed in mg/24 hours Place 14 mg onto the skin daily.   pantoprazole 20 MG tablet Commonly known as: Protonix Take 1 tablet (20 mg total) by mouth daily.   polyethylene glycol 17 g packet Commonly known as: MiraLax Take 17 g by mouth daily.   predniSONE 50 MG tablet Commonly known as: DELTASONE Take 1 tablet (50 mg total) by mouth daily for 2 days. Start taking on: December 22, 2022    tamsulosin 0.4 MG Caps capsule Commonly known as: FLOMAX Take 1 capsule (0.4 mg total) by mouth daily.        Consultations: None  Procedures/Studies:   DG Chest Port 1 View  Result Date: 12/19/2022 CLINICAL DATA:  Respiratory infection that is common for 2 days. Productive cough. EXAM: PORTABLE CHEST 1 VIEW COMPARISON:  CT chest 12/14/2022 and radiographs 01/12/2023 FINDINGS: Stable cardiomegaly. Loop recorder. Bibasilar atelectasis. Hazy opacity in the left lower lung obscuring the left heart border suspicious for pneumonia. No pleural effusion or pneumothorax. No displaced rib fractures. IMPRESSION: Hazy opacity in the left lower lung suspicious for pneumonia. Electronically Signed   By: Minerva Fester M.D.   On: 12/19/2022 20:13   CUP PACEART REMOTE DEVICE CHECK  Result Date: 12/16/2022 ILR summary report received. Battery status OK. Normal device function. No new symptom brady, or pause episodes. No new AF episodes. Monthly summary reports and ROV/PRN 1 tachy event occurred 5/11 @ 08:55, duration 12sec, HR 200, narrow regular rhythm.  Previously reviewed and routed. LA, CVRS  CT ABDOMEN PELVIS W CONTRAST  Result Date: 12/14/2022 CLINICAL DATA:  Abdominal pain, acute, nonlocalized.  Constipation EXAM: CT ABDOMEN AND PELVIS WITH CONTRAST TECHNIQUE: Multidetector CT imaging of the abdomen and pelvis was performed using the standard protocol following bolus administration of intravenous contrast. RADIATION DOSE REDUCTION: This exam was performed according to the departmental dose-optimization program which includes automated exposure control, adjustment of the mA and/or kV according to patient size and/or use of iterative reconstruction technique. CONTRAST:  OMNIPAQUE IOHEXOL 350 MG/ML SOLN COMPARISON:  None Available. FINDINGS: Lower chest: No acute abnormality Hepatobiliary: No focal hepatic abnormality. Gallbladder unremarkable. Pancreas: No focal abnormality or ductal  dilatation. Spleen: No focal abnormality.  Normal size. Adrenals/Urinary Tract: Adrenal glands normal. Bilateral renal cysts appear benign. The largest is in the left midpole measuring 5.5 cm. No follow-up imaging recommended. No stones or hydronephrosis. Urinary bladder unremarkable. Stomach/Bowel: Moderate stool burden throughout the colon. Large stool burden in the rectum. Cannot exclude early fecal impaction. Stomach and small bowel decompressed, unremarkable. No bowel obstruction. Vascular/Lymphatic: No evidence of aneurysm or adenopathy. Aortic atherosclerosis. Reproductive: Significantly enlarged prostate protruding into the base of the bladder. Peripheral calcifications throughout the prostate. Other: No free fluid or free air. Musculoskeletal: No acute bony abnormality. IMPRESSION: Moderate stool burden throughout the colon with large stool burden in the rectum. Cannot exclude early fecal impaction. Aortic atherosclerosis. Significant prostate enlargement with extensive peripheral calcifications. Electronically Signed   By: Charlett Nose M.D.   On: 12/14/2022 02:48  CT Angio Chest PE W and/or Wo Contrast  Result Date: 12/14/2022 CLINICAL DATA:  Pulmonary embolism (PE) suspected, high prob EXAM: CT ANGIOGRAPHY CHEST WITH CONTRAST TECHNIQUE: Multidetector CT imaging of the chest was performed using the standard protocol during bolus administration of intravenous contrast. Multiplanar CT image reconstructions and MIPs were obtained to evaluate the vascular anatomy. RADIATION DOSE REDUCTION: This exam was performed according to the departmental dose-optimization program which includes automated exposure control, adjustment of the mA and/or kV according to patient size and/or use of iterative reconstruction technique. CONTRAST:  OMNIPAQUE IOHEXOL 350 MG/ML SOLN COMPARISON:  10/23/2022 FINDINGS: Cardiovascular: No filling defects in the pulmonary arteries to suggest pulmonary emboli. Heart is normal  size. Aorta is normal caliber. Scattered coronary artery and aortic calcifications. Mediastinum/Nodes: No mediastinal, hilar, or axillary adenopathy. Trachea and esophagus are unremarkable. Thyroid unremarkable. Lungs/Pleura: Mild centrilobular emphysema. No confluent airspace opacities or effusions. Upper Abdomen: No acute findings Musculoskeletal: Chest wall soft tissues are unremarkable. No acute bony abnormality. Review of the MIP images confirms the above findings. IMPRESSION: No evidence of pulmonary embolus. Coronary artery disease. No acute cardiopulmonary disease. Aortic Atherosclerosis (ICD10-I70.0). Electronically Signed   By: Charlett Nose M.D.   On: 12/14/2022 02:45   DG Abdomen Acute W/Chest  Result Date: 12/13/2022 CLINICAL DATA:  Constipation EXAM: DG ABDOMEN ACUTE WITH 1 VIEW CHEST COMPARISON:  04/08/2022 FINDINGS: Cardiac shadow is stable. Loop recorder is seen. Lungs are hyperinflated but clear. Scattered large and small bowel gas is noted. No abnormal mass or abnormal calcifications are seen. No free air is noted. No acute bony abnormality is seen. IMPRESSION: No acute abnormality in the chest and abdomen. Electronically Signed   By: Alcide Clever M.D.   On: 12/13/2022 23:51       The results of significant diagnostics from this hospitalization (including imaging, microbiology, ancillary and laboratory) are listed below for reference.     Microbiology: Recent Results (from the past 240 hour(s))  Resp panel by RT-PCR (RSV, Flu A&B, Covid) Anterior Nasal Swab     Status: None   Collection Time: 12/19/22  7:45 PM   Specimen: Anterior Nasal Swab  Result Value Ref Range Status   SARS Coronavirus 2 by RT PCR NEGATIVE NEGATIVE Final    Comment: (NOTE) SARS-CoV-2 target nucleic acids are NOT DETECTED.  The SARS-CoV-2 RNA is generally detectable in upper respiratory specimens during the acute phase of infection. The lowest concentration of SARS-CoV-2 viral copies this assay can  detect is 138 copies/mL. A negative result does not preclude SARS-Cov-2 infection and should not be used as the sole basis for treatment or other patient management decisions. A negative result may occur with  improper specimen collection/handling, submission of specimen other than nasopharyngeal swab, presence of viral mutation(s) within the areas targeted by this assay, and inadequate number of viral copies(<138 copies/mL). A negative result must be combined with clinical observations, patient history, and epidemiological information. The expected result is Negative.  Fact Sheet for Patients:  BloggerCourse.com  Fact Sheet for Healthcare Providers:  SeriousBroker.it  This test is no t yet approved or cleared by the [place] FDA and  has been authorized for detection and/or diagnosis of SARS-CoV-2 by FDA under an Emergency Use Authorization (EUA). This EUA will remain  in effect (meaning this test can be used) for the duration of the COVID-19 declaration under Section 564(b)(1) of the Act, 21 U.S.C.section 360bbb-3(b)(1), unless the authorization is terminated  or revoked sooner.  Influenza A by PCR NEGATIVE NEGATIVE Final   Influenza B by PCR NEGATIVE NEGATIVE Final    Comment: (NOTE) The Xpert Xpress SARS-CoV-2/FLU/RSV plus assay is intended as an aid in the diagnosis of influenza from Nasopharyngeal swab specimens and should not be used as a sole basis for treatment. Nasal washings and aspirates are unacceptable for Xpert Xpress SARS-CoV-2/FLU/RSV testing.  Fact Sheet for Patients: BloggerCourse.com  Fact Sheet for Healthcare Providers: SeriousBroker.it  This test is not yet approved or cleared by the [place] FDA and has been authorized for detection and/or diagnosis of SARS-CoV-2 by FDA under an Emergency Use Authorization (EUA). This EUA will remain in  effect (meaning this test can be used) for the duration of the COVID-19 declaration under Section 564(b)(1) of the Act, 21 U.S.C. section 360bbb-3(b)(1), unless the authorization is terminated or revoked.     Resp Syncytial Virus by PCR NEGATIVE NEGATIVE Final    Comment: (NOTE) Fact Sheet for Patients: BloggerCourse.com  Fact Sheet for Healthcare Providers: SeriousBroker.it  This test is not yet approved or cleared by the [place] FDA and has been authorized for detection and/or diagnosis of SARS-CoV-2 by FDA under an Emergency Use Authorization (EUA). This EUA will remain in effect (meaning this test can be used) for the duration of the COVID-19 declaration under Section 564(b)(1) of the Act, 21 U.S.C. section 360bbb-3(b)(1), unless the authorization is terminated or revoked.  Performed at [facility], [address] [place], [place] [address]   Blood culture (routine x 2)     Status: None (Preliminary result)   Collection Time: 12/19/22  8:40 PM   Specimen: BLOOD  Result Value Ref Range Status   Specimen Description   Final    BLOOD LEFT ANTECUBITAL Performed at [facility], [address] [place], [place] [address]    Special Requests   Final    BOTTLES DRAWN AEROBIC AND ANAEROBIC Blood Culture adequate volume Performed at [facility], [address] [place], [place] [address]    Culture   Final    NO GROWTH 2 DAYS Performed at [facility], [address], [place], [place] [address]    Report Status PENDING  Incomplete  Blood culture (routine x 2)     Status: None (Preliminary result)   Collection Time: 12/19/22  8:45 PM   Specimen: BLOOD  Result Value Ref Range Status   Specimen Description   Final    BLOOD RIGHT ANTECUBITAL Performed at [facility], [address] [place], [place] [address]    Special Requests   Final    BOTTLES DRAWN AEROBIC AND  ANAEROBIC Blood Culture adequate volume Performed at [facility], [address] [place], [place] [address]    Culture   Final    NO GROWTH 2 DAYS Performed at [facility], [address], [place], [place] [address]    Report Status PENDING  Incomplete  Expectorated Sputum Assessment w Gram Stain, Rflx to Resp Cult     Status: None   Collection Time: 12/19/22  9:35 PM   Specimen: Sputum  Result Value Ref Range Status   Specimen Description   Final    SPUTUM Performed at [facility], [address] [place], [place] [address]    Special Requests   Final    NONE Performed at [facility], [address] [place], [place]  [address]    Sputum evaluation   Final    THIS SPECIMEN IS ACCEPTABLE FOR SPUTUM CULTURE Performed at [facility], [address] [place], [place] [address]    Report Status 12/20/2022 FINAL  Final  Culture, Respiratory w Gram Stain     Status: None (Preliminary result)   Collection Time: 12/19/22  9:35 PM   Specimen: SPU  Result Value Ref Range Status   Specimen Description SPUTUM  Final   Special Requests NONE Reflexed from [id number]  Final   Gram Stain   Final    FEW SQUAMOUS EPITHELIAL CELLS PRESENT MODERATE WBC PRESENT,BOTH PMN AND MONONUCLEAR FEW GRAM POSITIVE COCCI IN CHAINS FEW GRAM NEGATIVE RODS    Culture   Final    FEW HAEMOPHILUS INFLUENZAE BETA LACTAMASE NEGATIVE Performed at [facility], [address], [place], [place] [address]    Report Status PENDING  Incomplete  Respiratory (~20 pathogens) panel by PCR     Status: None   Collection Time: 12/20/22  6:22 PM   Specimen: Nasopharyngeal Swab; Respiratory  Result Value Ref Range Status   Adenovirus NOT DETECTED NOT DETECTED Final   Coronavirus 229E NOT DETECTED NOT DETECTED Final    Comment: (NOTE) The Coronavirus on the Respiratory Panel, DOES NOT test for the novel  Coronavirus (2019 nCoV)    Coronavirus HKU1 NOT DETECTED NOT  DETECTED Final   Coronavirus NL63 NOT DETECTED NOT DETECTED Final   Coronavirus OC43 NOT DETECTED NOT DETECTED Final   Metapneumovirus NOT DETECTED NOT DETECTED Final   Rhinovirus / Enterovirus NOT DETECTED NOT DETECTED Final   Influenza A NOT DETECTED NOT DETECTED Final   Influenza B NOT DETECTED NOT DETECTED Final   Parainfluenza Virus 1 NOT DETECTED NOT DETECTED Final   Parainfluenza Virus 2 NOT DETECTED NOT DETECTED Final   Parainfluenza Virus 3 NOT DETECTED NOT DETECTED Final   Parainfluenza Virus 4 NOT DETECTED NOT DETECTED Final   Respiratory Syncytial Virus NOT DETECTED NOT DETECTED Final   Bordetella pertussis NOT DETECTED NOT DETECTED Final   Bordetella Parapertussis NOT DETECTED NOT DETECTED Final   Chlamydophila pneumoniae NOT DETECTED NOT DETECTED Final   Mycoplasma pneumoniae NOT DETECTED NOT DETECTED Final    Comment: Performed at [facility], [address], [place], [place] [address]     Labs:  CBC: Recent Labs  Lab 12/19/22 1945 12/20/22 0621 12/21/22 0548  WBC 10.6* 8.2 8.1  HGB 13.7 13.1 13.0  HCT 41.7 40.1 39.4  MCV 92.7 94.1 92.7  PLT 383 322 359   BMP &GFR Recent Labs  Lab 12/19/22 1945 12/20/22 0621 12/21/22 0548  NA 135 137 139  K 3.9 3.6 4.0  CL 100 103 102  CO2 21* 23 23  GLUCOSE 99 78 111*  BUN 14 8 9   CREATININE 1.09 0.94 0.84  CALCIUM 8.8* 8.3* 8.9  MG  --   --  2.3  PHOS  --   --  3.3   Estimated Creatinine Clearance: 75.7 mL/min (by C-G formula based on SCr of 0.84 mg/dL). Liver & Pancreas: Recent Labs  Lab 12/21/22 0548  ALBUMIN 3.1*   No results for input(s): "LIPASE", "AMYLASE" in the last 168 hours. No results for input(s): "AMMONIA" in the last 168 hours. Diabetic: No results for input(s): "HGBA1C" in the last 72 hours. Recent Labs  Lab 12/19/22 2001  GLUCAP 85   Cardiac Enzymes: No results for input(s): "CKTOTAL", "CKMB", "CKMBINDEX", "TROPONINI" in the last 168 hours. No  results for input(s): "PROBNP" in  the last 8760 hours. Coagulation Profile: No results for input(s): "INR", "PROTIME" in the last 168 hours. Thyroid Function Tests: No results for input(s): "TSH", "T4TOTAL", "FREET4", "T3FREE", "THYROIDAB" in the last 72 hours. Lipid Profile: No results for input(s): "CHOL", "HDL", "LDLCALC", "TRIG", "CHOLHDL", "LDLDIRECT" in the last 72 hours. Anemia Panel: Recent Labs    12/21/22 0548  VITAMINB12 1,312*  FOLATE 18.8  FERRITIN 91  TIBC 284  IRON 19*  RETICCTPCT 1.3   Urine analysis:    Component Value Date/Time   COLORURINE YELLOW 12/19/2022 2240   APPEARANCEUR HAZY (A) 12/19/2022 2240   LABSPEC 1.011 12/19/2022 2240   PHURINE 6.0 12/19/2022 2240   GLUCOSEU NEGATIVE 12/19/2022 2240   HGBUR NEGATIVE 12/19/2022 2240   BILIRUBINUR NEGATIVE 12/19/2022 2240   KETONESUR 20 (A) 12/19/2022 2240   PROTEINUR NEGATIVE 12/19/2022 2240   NITRITE NEGATIVE 12/19/2022 2240   LEUKOCYTESUR LARGE (A) 12/19/2022 2240   Sepsis Labs: Invalid input(s): "PROCALCITONIN", "LACTICIDVEN"   SIGNED:  Almon Hercules, MD  [facility] 12/21/2022, 9:46 PM

## 2022-12-21 NOTE — Plan of Care (Addendum)
Discharge instructions discussed with patient and daughter Patsy Lager.  Patient and daughter instructed on home medications, restrictions, and follow up appointments. Belongings gathered and sent with patient.  Patients medications to be picked up at [facility] in [facility]. Daughter will be available to pick patient up after 12:30 today.

## 2022-12-21 NOTE — Plan of Care (Addendum)
Patient is resting in chair with normal vitals and respirations. Patient is being prepared for discharge by discharge nurse. All meds have been given and tolerated well. Education has been given to patient and daughter by discharge nurse. No confusion present. No distress or pain noted or verbalized. Plan of care is ongoing.  Problem: Education: Goal: Knowledge of General Education information will improve Description: Including pain rating scale, medication(s)/side effects and non-pharmacologic comfort measures Outcome: Progressing   Problem: Health Behavior/Discharge Planning: Goal: Ability to manage health-related needs will improve Outcome: Progressing   Problem: Clinical Measurements: Goal: Ability to maintain clinical measurements within normal limits will improve Outcome: Progressing Goal: Will remain free from infection Outcome: Progressing Goal: Diagnostic test results will improve Outcome: Progressing Goal: Respiratory complications will improve Outcome: Progressing Goal: Cardiovascular complication will be avoided Outcome: Progressing   Problem: Activity: Goal: Risk for activity intolerance will decrease Outcome: Progressing   Problem: Nutrition: Goal: Adequate nutrition will be maintained Outcome: Progressing   Problem: Coping: Goal: Level of anxiety will decrease Outcome: Progressing   Problem: Elimination: Goal: Will not experience complications related to bowel motility Outcome: Progressing Goal: Will not experience complications related to urinary retention Outcome: Progressing   Problem: Pain Managment: Goal: General experience of comfort will improve Outcome: Progressing   Problem: Safety: Goal: Ability to remain free from injury will improve Outcome: Progressing   Problem: Skin Integrity: Goal: Risk for impaired skin integrity will decrease Outcome: Progressing   Problem: Activity: Goal: Ability to tolerate increased activity will  improve Outcome: Progressing   Problem: Clinical Measurements: Goal: Ability to maintain a body temperature in the normal range will improve Outcome: Progressing   Problem: Respiratory: Goal: Ability to maintain adequate ventilation will improve Outcome: Progressing Goal: Ability to maintain a clear airway will improve Outcome: Progressing

## 2022-12-22 LAB — CULTURE, RESPIRATORY W GRAM STAIN

## 2022-12-23 ENCOUNTER — Ambulatory Visit: Payer: Medicare Other

## 2022-12-23 LAB — CULTURE, BLOOD (ROUTINE X 2): Culture: NO GROWTH

## 2022-12-24 LAB — CULTURE, BLOOD (ROUTINE X 2)
Culture: NO GROWTH
Special Requests: ADEQUATE

## 2022-12-31 NOTE — Progress Notes (Signed)
[facility] Summary Report / Loop Recorder 

## 2023-01-15 ENCOUNTER — Ambulatory Visit (INDEPENDENT_AMBULATORY_CARE_PROVIDER_SITE_OTHER): Payer: Medicare Other

## 2023-01-15 DIAGNOSIS — I639 Cerebral infarction, unspecified: Secondary | ICD-10-CM | POA: Diagnosis not present

## 2023-01-16 LAB — CUP PACEART REMOTE DEVICE CHECK
Date Time Interrogation Session: 20240718003034
Implantable Pulse Generator Implant Date: 20211206

## 2023-01-28 ENCOUNTER — Telehealth: Payer: Self-pay

## 2023-01-28 NOTE — Telephone Encounter (Signed)
-----   Message from Nurse [place] P sent at 10/30/2022  3:18 PM EDT ----- Regarding: 63-month labs CBC and IBC + Ferritin due - OV on 8/1

## 2023-01-28 NOTE — Telephone Encounter (Signed)
Called and spoke with patient regarding to lab reminder. Pt requested that I call his daughter Adrian Rodgers. Called and spoke with Adrian Rodgers, she is aware that patient is due for repeat labs prior to his office visit. Adrian Rodgers is aware that no appt is necessary and pt can stop by tomorrow at their convenience.Adrian Rodgers states that she did discontinue patient's iron about a month ago due to constipation. I advised Adrian Rodgers that pt's iron level from last month was 19, if he is not tolerating oral iron then Dr. Adela Lank may order IV iron at his visit later this week depending on what the labs show. [name] verbalized understanding and had no concerns at the end of the call.

## 2023-01-29 ENCOUNTER — Other Ambulatory Visit (INDEPENDENT_AMBULATORY_CARE_PROVIDER_SITE_OTHER): Payer: Medicare Other

## 2023-01-29 DIAGNOSIS — D649 Anemia, unspecified: Secondary | ICD-10-CM | POA: Diagnosis not present

## 2023-01-29 DIAGNOSIS — Z7902 Long term (current) use of antithrombotics/antiplatelets: Secondary | ICD-10-CM

## 2023-01-29 DIAGNOSIS — K250 Acute gastric ulcer with hemorrhage: Secondary | ICD-10-CM

## 2023-01-29 DIAGNOSIS — Z8619 Personal history of other infectious and parasitic diseases: Secondary | ICD-10-CM

## 2023-01-29 DIAGNOSIS — Z9981 Dependence on supplemental oxygen: Secondary | ICD-10-CM

## 2023-01-29 LAB — CBC WITH DIFFERENTIAL/PLATELET
Basophils Absolute: 0.1 10*3/uL (ref 0.0–0.1)
Basophils Relative: 1 % (ref 0.0–3.0)
Eosinophils Absolute: 0.2 10*3/uL (ref 0.0–0.7)
Eosinophils Relative: 3.9 % (ref 0.0–5.0)
HCT: 42.6 % (ref 39.0–52.0)
Hemoglobin: 13.9 g/dL (ref 13.0–17.0)
Lymphocytes Relative: 27 % (ref 12.0–46.0)
Lymphs Abs: 1.4 10*3/uL (ref 0.7–4.0)
MCHC: 32.7 g/dL (ref 30.0–36.0)
MCV: 92.9 fl (ref 78.0–100.0)
Monocytes Absolute: 0.4 10*3/uL (ref 0.1–1.0)
Monocytes Relative: 8 % (ref 3.0–12.0)
Neutro Abs: 3.1 10*3/uL (ref 1.4–7.7)
Neutrophils Relative %: 60.1 % (ref 43.0–77.0)
Platelets: 384 10*3/uL (ref 150.0–400.0)
RBC: 4.59 Mil/uL (ref 4.22–5.81)
RDW: 15.1 % (ref 11.5–15.5)
WBC: 5.1 10*3/uL (ref 4.0–10.5)

## 2023-01-29 LAB — IBC + FERRITIN
Ferritin: 51.3 ng/mL (ref 22.0–322.0)
Iron: 90 ug/dL (ref 42–165)
Saturation Ratios: 29.5 % (ref 20.0–50.0)
TIBC: 305.2 ug/dL (ref 250.0–450.0)
Transferrin: 218 mg/dL (ref 212.0–360.0)

## 2023-01-30 ENCOUNTER — Ambulatory Visit (INDEPENDENT_AMBULATORY_CARE_PROVIDER_SITE_OTHER): Payer: Medicare Other | Admitting: Gastroenterology

## 2023-01-30 ENCOUNTER — Encounter: Payer: Self-pay | Admitting: Gastroenterology

## 2023-01-30 VITALS — BP 118/70 | HR 80 | Ht 72.0 in | Wt 191.0 lb

## 2023-01-30 DIAGNOSIS — Z8619 Personal history of other infectious and parasitic diseases: Secondary | ICD-10-CM

## 2023-01-30 DIAGNOSIS — D509 Iron deficiency anemia, unspecified: Secondary | ICD-10-CM | POA: Diagnosis not present

## 2023-01-30 DIAGNOSIS — Z8711 Personal history of peptic ulcer disease: Secondary | ICD-10-CM

## 2023-01-30 DIAGNOSIS — Z79899 Other long term (current) drug therapy: Secondary | ICD-10-CM

## 2023-01-30 DIAGNOSIS — Z7902 Long term (current) use of antithrombotics/antiplatelets: Secondary | ICD-10-CM

## 2023-01-30 DIAGNOSIS — K5900 Constipation, unspecified: Secondary | ICD-10-CM

## 2023-01-30 NOTE — Patient Instructions (Signed)
Continue Protonix and Miralax.  Your provider has ordered "Diatherix" stool testing for you. You have received a kit from our office today containing all necessary supplies to complete this test. Please carefully read the stool collection instructions provided in the kit before opening the accompanying materials. In addition, be sure to place the label from the top left corner of the laboratory request sheet onto the "puritan opti-swab" tube that is supplied in the kit. This label should include your full name and date of birth. After completing the test, you should secure the purtian tube into the specimen biohazard bag. The laboratory request information sheet (including date and time of specimen collection) should be placed into the outside pocket of the specimen biohazard bag and returned to the [facility] lab with 2 days of collection.   You will be due for labs in 6 months.  Thank you for entrusting me with your care and for choosing [facility], Dr. Ileene Patrick    If your blood pressure at your visit was 140/90 or greater, please contact your primary care physician to follow up on this. ______________________________________________________  If you are age 43 or older, your body mass index should be between 23-30. Your Body mass index is 25.9 kg/m. If this is out of the aforementioned range listed, please consider follow up with your Primary Care Provider.  If you are age 51 or younger, your body mass index should be between 19-25. Your Body mass index is 25.9 kg/m. If this is out of the aformentioned range listed, please consider follow up with your Primary Care Provider.  ________________________________________________________  The [facility] providers would like to encourage you to use [facility] to communicate with providers for non-urgent requests or questions.  Due to long hold times on the telephone, sending your provider a message by [facility] may be a faster and more  efficient way to get a response.  Please allow 48 business hours for a response.  Please remember that this is for non-urgent requests.  _______________________________________________________  Due to recent changes in healthcare laws, you may see the results of your imaging and laboratory studies on MyChart before your provider has had a chance to review them.  We understand that in some cases there may be results that are confusing or concerning to you. Not all laboratory results come back in the same time frame and the provider may be waiting for multiple results in order to interpret others.  Please give [place] 48 hours in order for your provider to thoroughly review all the results before contacting the office for clarification of your results.

## 2023-01-30 NOTE — Progress Notes (Signed)
[facility] Summary Report / Loop Recorder 

## 2023-01-30 NOTE — Progress Notes (Signed)
HPI :  82 year old male accompanied by his daughter today, here for a follow-up visit for history of gastric ulcer, H pylori.    Recall he was admitted to the hospital in October 2023, had recent admission for COVID with hypoxic respiratory failure, on supplemental oxygen.  He was admitted with hypotension and mild confusion.  He had heme positive stool with a downtrending hemoglobin.  I performed an upper endoscopy which showed multiple gastric ulcers, 1 of which had stigmata for bleeding with an adherent clot, treated endoscopically with Puristat with a good response.  Biopsies were taken and positive for H. pylori which was the likely cause of his ulcers.  He did not endorse NSAID use at the time.  He was treated with a combination of amoxicillin, clarithromycin, and Flagyl along with PPI twice daily.  We had recommended follow-up H. pylori eradication testing with a stool test but he has not had that done yet.  I last saw him on April 30.  He was doing quite well at that time in regards to no recurrent bleeding issues or anemia.  We had discussed at the time if he wanted to proceed with surveillance endoscopy to confirm mucosal healing.  Given his supplemental oxygen use and comorbidities we decided to hold off on that.  I did recommend an H. pylori Diatherix swab to check for H. pylori eradication, daughter states he did not do it and likely will do it at home.  He has continued pantoprazole dose to 20 mg once daily.  Since his last visit he was admitted at the end of June with pneumonia.  Treated with antibiotics for a few days and eventually discharged.  He is in respiratory therapy.  He is supposed to take supplemental oxygen at home but daughter states he does not do it.  He has baseline dyspnea from COPD.  He was previously on iron supplement but stopped all of this.  During his hospitalization he was found to have a slightly low iron level of 19 with a low iron sat of 7%.  Ferritin was normal  at 91, his hemoglobin was 13.0 at the time.  He stopped iron as it was causing constipation.  He has been on MiraLAX for constipation and that has worked well for him.  He denies any dark stools or no blood in the stool.  He had labs repeated yesterday, 7/31, with hemoglobin of 13.9, and normal iron studies while off iron.  He denies any abdominal pains.  No reflux symptoms.  He tolerates Protonix well.  He is not using any NSAIDs.  Remains on Plavix.  Remote colonoscopy, results unavailable.   EF 04/09/22 - EF 70-75%, grade I DD     Lab Results  Component Value Date   WBC 5.1 01/29/2023   HGB 13.9 01/29/2023   HCT 42.6 01/29/2023   MCV 92.9 01/29/2023   PLT 384.0 01/29/2023       Latest Ref Rng & Units 01/29/2023    8:08 AM 12/21/2022    5:48 AM 12/20/2022    6:21 AM  CBC  WBC 4.0 - 10.5 K/uL 5.1  8.1  8.2   Hemoglobin 13.0 - 17.0 g/dL 16.1  09.6  04.5   Hematocrit 39.0 - 52.0 % 42.6  39.4  40.1   Platelets 150.0 - 400.0 K/uL 384.0  359  322     Lab Results  Component Value Date   IRON 90 01/29/2023   TIBC 305.2 01/29/2023   FERRITIN 51.3  01/29/2023    CT abdomen / pelvis 12/14/22: IMPRESSION: Moderate stool burden throughout the colon with large stool burden in the rectum. Cannot exclude early fecal impaction. Aortic atherosclerosis. Significant prostate enlargement with extensive peripheral calcifications.  CT PE protocol 12/14/22: IMPRESSION: No evidence of pulmonary embolus. Coronary artery disease. No acute cardiopulmonary disease. Aortic Atherosclerosis (ICD10-I70.0).  CXR 12/19/22: IMPRESSION: Hazy opacity in the left lower lung suspicious for pneumonia.    Past Medical History:  Diagnosis Date   Anemia    Aortic atherosclerosis (HCC)    Cerebral aneurysm    Community acquired pneumonia of right middle lobe of lung 09/20/2017   Emphysema of lung (HCC)    Esophagitis    Gastric ulcer    H. 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)    Stroke (HCC) 05/01/2020   Tobacco dependence      Past Surgical History:  Procedure Laterality Date   BIOPSY  04/10/2022   Procedure: BIOPSY;  Surgeon: Benancio Deeds, MD;  Location: [facility] ENDOSCOPY;  Service: Gastroenterology;;   ESOPHAGOGASTRODUODENOSCOPY (EGD) WITH PROPOFOL N/A 04/10/2022   Procedure: ESOPHAGOGASTRODUODENOSCOPY (EGD) WITH PROPOFOL;  Surgeon: Benancio Deeds, MD;  Location: [facility] ENDOSCOPY;  Service: Gastroenterology;  Laterality: N/A;   HEMOSTASIS CONTROL  04/10/2022   Procedure: HEMOSTASIS CONTROL;  Surgeon: Benancio Deeds, MD;  Location: [facility] ENDOSCOPY;  Service: Gastroenterology;;  purastat    implantable loop recorder placement  06/05/2020   [employer] Reveal [place] model UUV25 [id number]) implantable loop recorder    Family History  Family history unknown: Yes   Social History   Tobacco Use   Smoking status: Every Day    Current packs/day: 1.00    Average packs/day: 1 pack/day for 20.0 years (20.0 ttl pk-yrs)    Types: Cigarettes   Smokeless tobacco: Never  Vaping Use   Vaping status: Never Used  Substance Use Topics   Alcohol use: Not Currently    Alcohol/week: 1.0 standard drink of alcohol    Types: 1 Cans of beer per week    Comment: remote h/o heavy use   Drug use: Never   Current Outpatient Medications  Medication Sig Dispense Refill   acetaminophen (TYLENOL) 500 MG tablet Take 1,000 mg by mouth as needed for moderate pain, mild pain, fever or headache.     albuterol (PROVENTIL HFA;VENTOLIN HFA) 108 (90 Base) MCG/ACT inhaler Inhale 2 puffs into the lungs every 6 (six) hours as needed for wheezing or shortness of breath. (Patient taking differently: Inhale 2 puffs into the lungs as needed for wheezing or shortness of breath.) 1 Inhaler 2   atorvastatin (LIPITOR) 80 MG tablet Take 1 tablet (80 mg total) by mouth daily.     azithromycin (ZITHROMAX) 250 MG  tablet Take 1 tablet (250 mg total) by mouth daily. 3 each 0   BREZTRI AEROSPHERE 160-9-4.8 MCG/ACT AERO Inhale 2 puffs into the lungs 2 (two) times daily.     clopidogrel (PLAVIX) 75 MG tablet Take 75 mg by mouth daily.     losartan (COZAAR) 50 MG tablet Take 50 mg by mouth daily.     Multiple Vitamins-Minerals (CENTRUM SILVER 50+MEN) TABS Take 1 tablet by mouth daily with breakfast.     pantoprazole (PROTONIX) 20 MG tablet Take 1 tablet (20 mg total) by mouth daily. 30 tablet 6   polyethylene glycol (MIRALAX) 17 g packet Take 17 g by  mouth daily. 14 each 0   tamsulosin (FLOMAX) 0.4 MG CAPS capsule Take 1 capsule (0.4 mg total) by mouth daily. 30 capsule 0   No current facility-administered medications for this visit.   No Known Allergies   Review of Systems: All systems reviewed and negative except where noted in HPI.   Labs per HPI, reviewed in Epic  Physical Exam: BP 118/70   Pulse 80   Ht 6' (1.829 m)   Wt 191 lb (86.6 kg)   BMI 25.90 kg/m  Constitutional: Pleasant,well-developed, male in no acute distress. Neurological: Alert and oriented to person place and time. Skin: Skin is warm and dry. No rashes noted. Psychiatric: Normal mood and affect. Behavior is normal.   ASSESSMENT: 82 y.o. male here for assessment of the following  1. History of gastric ulcer   2. History of Helicobacter pylori infection   3. Iron deficiency anemia, unspecified iron deficiency anemia type   4. Antiplatelet or antithrombotic long-term use   5. Long-term current use of proton pump inhibitor therapy   6. Constipation, unspecified constipation type    History of gastric ulcer with GI bleeding related to H. pylori infection at the time.  He was treated for H. pylori but has failed to follow-up for H. pylori eradication testing with stool test.  Given his comorbidities and supplemental oxygen use he is higher than average risk for anesthesia.  We had another discussion with the patient and his  daughter today about whether or not they wanted her surveillance endoscopy to confirm mucosal healing.  Following discussion of risks and benefits of this, given he is feeling well at this time and no anemia, they wish to hold off on endoscopy and colonoscopy which I think is reasonable given his comorbidities and recent hospitalization for respiratory issues.  Hopefully this also has healed with PPI and H. pylori treatment.  That being said, I do recommend H. pylori eradication testing.  Daughter does not think he will be able to submit a stool test from home.  In this light I offered him H. pylori Diatherix swab in the office today so we can get this test done for him, while on PPI.  They were agreeable and this was completed, will let them know the results when it returns.  Interestingly history of iron deficiency, off supplemental iron his iron levels were slightly low without anemia when hospitalized in June.  I repeated his labs yesterday and his iron studies are normal while off supplemental iron, hemoglobin normal.  This is reassuring.  Given his history of peptic ulcer disease in the past, we will continue Protonix for prophylaxis moving forward.  They understand the long-term risks of chronic PPI use, currently on lowest dose Protonix 20 mg daily which we will continue.  Otherwise we will plan on repeating his iron studies and blood work in about 6 months or so to make sure stable.  He will continue to use MiraLAX for constipation as needed.  PLAN: - IDA resolved off iron, repeat labs in 6 months for reassessment - offered EGD and colonoscopy at the hospital - discussed risks / benefits - they decline due to concern for his respiratory status which is reasonable, especially given recent course with hospitalization for pneumonia and supplemental oxygen use - continue protonix 20mg  / day, avoid NSAIDs. Counseled on risks of chronic PPI - H pylori diatherix swab done in the office today (he did  not submit stool sample previously) - continue Miralax for constipation -  lab - CBC, TIBC / ferritin in 6 months  Harlin Rain, MD [facility]

## 2023-02-13 ENCOUNTER — Encounter: Payer: Self-pay | Admitting: Gastroenterology

## 2023-02-17 ENCOUNTER — Ambulatory Visit: Payer: Medicare Other

## 2023-02-17 DIAGNOSIS — I639 Cerebral infarction, unspecified: Secondary | ICD-10-CM

## 2023-02-18 LAB — CUP PACEART REMOTE DEVICE CHECK
Date Time Interrogation Session: 20240820003331
Implantable Pulse Generator Implant Date: 20211206

## 2023-02-28 NOTE — Progress Notes (Signed)
[facility] Summary Report / Loop Recorder 

## 2023-03-24 ENCOUNTER — Ambulatory Visit (INDEPENDENT_AMBULATORY_CARE_PROVIDER_SITE_OTHER): Payer: Medicare Other

## 2023-03-24 DIAGNOSIS — I639 Cerebral infarction, unspecified: Secondary | ICD-10-CM

## 2023-03-24 LAB — CUP PACEART REMOTE DEVICE CHECK
Date Time Interrogation Session: 20240922003701
Implantable Pulse Generator Implant Date: 20211206

## 2023-04-08 NOTE — Progress Notes (Signed)
[facility] Summary Report / Loop Recorder 

## 2023-04-28 ENCOUNTER — Ambulatory Visit (INDEPENDENT_AMBULATORY_CARE_PROVIDER_SITE_OTHER): Payer: Medicare Other

## 2023-04-28 DIAGNOSIS — I639 Cerebral infarction, unspecified: Secondary | ICD-10-CM

## 2023-04-28 LAB — CUP PACEART REMOTE DEVICE CHECK
Date Time Interrogation Session: 20241027230358
Implantable Pulse Generator Implant Date: 20211206

## 2023-05-19 NOTE — Progress Notes (Signed)
[facility] Summary Report / Loop Recorder 

## 2023-06-01 LAB — CUP PACEART REMOTE DEVICE CHECK
Date Time Interrogation Session: 20241130230235
Implantable Pulse Generator Implant Date: 20211206

## 2023-06-02 ENCOUNTER — Ambulatory Visit: Payer: Medicare Other

## 2023-06-02 DIAGNOSIS — I639 Cerebral infarction, unspecified: Secondary | ICD-10-CM | POA: Diagnosis not present

## 2023-06-23 ENCOUNTER — Telehealth: Payer: Self-pay

## 2023-06-23 NOTE — Telephone Encounter (Signed)
Alert received from [employer] for AF event 12/16 @ 02:07, duration 2hrs , mean HR 125.  No hx of PAF, no OAC per EPIC Route to triage high alert per protocol 1 tachy event, c/w SVT, 12/15 @ 18:38, duration 44sec, mean HR 207.  Attempted to contact patient. No answer, LMTCB. Spoke to pts daughter Adrian Rodgers who states she will have patient call the device clinic.  Need pt to send manual transmission to force [employer]GM's of AF.   If AF is confirmed, recommend AF clinic referral to discuss OAC.

## 2023-06-23 NOTE — Telephone Encounter (Signed)
I got the pt to send a manual transmission for the nurse to review. I told him once the nurse review it, the nurse will give him a call back.

## 2023-06-23 NOTE — Telephone Encounter (Signed)
Outreach made to Pt.  Advised afib found on loop recorder. Will schedule follow up with Afib clinic for June 27, 2023 at 1:30 pm.  Outreach also made to daughter Patsy Lager.  Directions given to afib clinic.  She had questions regarding atrial fibrillation and these were addressed.  Phone number given to afib clinic.

## 2023-06-24 ENCOUNTER — Ambulatory Visit (HOSPITAL_COMMUNITY)
Admission: RE | Admit: 2023-06-24 | Discharge: 2023-06-24 | Disposition: A | Discharge status: Home / Self Care | Payer: Medicare Other | Source: Ambulatory Visit | Attending: Internal Medicine | Admitting: Internal Medicine

## 2023-06-24 VITALS — BP 132/82 | HR 85 | Ht 72.0 in | Wt 186.0 lb

## 2023-06-24 DIAGNOSIS — I4891 Unspecified atrial fibrillation: Secondary | ICD-10-CM | POA: Diagnosis not present

## 2023-06-24 DIAGNOSIS — I480 Paroxysmal atrial fibrillation: Secondary | ICD-10-CM | POA: Insufficient documentation

## 2023-06-24 DIAGNOSIS — Z7901 Long term (current) use of anticoagulants: Secondary | ICD-10-CM | POA: Insufficient documentation

## 2023-06-24 DIAGNOSIS — I10 Essential (primary) hypertension: Secondary | ICD-10-CM | POA: Diagnosis not present

## 2023-06-24 DIAGNOSIS — E785 Hyperlipidemia, unspecified: Secondary | ICD-10-CM | POA: Insufficient documentation

## 2023-06-24 DIAGNOSIS — J449 Chronic obstructive pulmonary disease, unspecified: Secondary | ICD-10-CM | POA: Insufficient documentation

## 2023-06-24 DIAGNOSIS — D6869 Other thrombophilia: Secondary | ICD-10-CM | POA: Insufficient documentation

## 2023-06-24 DIAGNOSIS — Z8673 Personal history of transient ischemic attack (TIA), and cerebral infarction without residual deficits: Secondary | ICD-10-CM | POA: Insufficient documentation

## 2023-06-24 DIAGNOSIS — F17210 Nicotine dependence, cigarettes, uncomplicated: Secondary | ICD-10-CM | POA: Diagnosis not present

## 2023-06-24 MED ORDER — APIXABAN 5 MG PO TABS: 5.0000 mg | ORAL_TABLET | Freq: Two times a day (BID) | ORAL | 3 refills | Status: DC

## 2023-06-24 NOTE — Patient Instructions (Signed)
Stop plavix  Start Eliquis 5mg  twice a day     Atrial fibrillation (AFib) is a type of heartbeat that is irregular or fast. If you have AFib, your heart beats without any order. This makes it hard for your heart to pump blood in a normal way. AFib may come and go, or it may become a long-lasting problem. If AFib is not treated, it can put you at higher risk for stroke, heart failure, and other heart problems. What are the causes? AFib may be caused by diseases that damage the heart's electrical system. They include: High blood pressure. Heart failure. Heart valve diseases. Heart surgery. Diabetes. Thyroid disease. Kidney disease. Lung diseases, such as pneumonia or COPD. Sleep apnea. Sometimes the cause is not known. What increases the risk? You are more likely to develop AFib if: You are older. You exercise often and very hard. You have a family history of AFib. You are male. You are Caucasian. You are overweight. You smoke. You drink a lot of alcohol. What are the signs or symptoms? Common symptoms of this condition include: A feeling that your heart is beating very fast. Chest pain or discomfort. Feeling short of breath. Suddenly feeling light-headed or weak. Getting tired easily during activity. Fainting. Sweating. In some cases, there are no symptoms. How is this treated? Medicines to: Prevent blood clots. Treat heart rate or heart rhythm problems. Using devices, such as a pacemaker, to correct heart rhythm problems. Doing surgery to remove the part of the heart that sends bad signals. Closing an area where clots can form in the heart (left atrial appendage). In some cases, your doctor will treat other underlying conditions. Follow these instructions at home: Medicines Take over-the-counter and prescription medicines only as told by your doctor. Do not take any new medicines without first talking to your doctor. If you are taking blood thinners: Talk with  your doctor before taking aspirin or NSAIDs, such as ibuprofen. Take your medicines as told. Take them at the same time each day. Do not do things that could hurt or bruise you. Be careful to avoid falls. Wear an alert bracelet or carry a card that says you take blood thinners. Lifestyle Do not smoke or use any products that contain nicotine or tobacco. If you need help quitting, ask your doctor. Eat heart-healthy foods. Talk with your doctor about the right eating plan for you. Exercise regularly as told by your doctor. Do not drink alcohol. Lose weight if you are overweight. General instructions If you have sleep apnea, treat it as told by your doctor. Do not use diet pills unless your doctor says they are safe for you. Diet pills may make heart problems worse. Keep all follow-up visits. Your doctor will check your heart rate and rhythm regularly. Contact a doctor if: You notice a change in the speed, rhythm, or strength of your heartbeat. You are taking a blood-thinning medicine and you get more bruising. You get tired more easily when you move or exercise. You have a sudden change in weight. Get help right away if:  You have pain in your chest. You have trouble breathing. You have side effects of blood thinners, such as blood in your vomit, poop (stool), or pee (urine), or bleeding that cannot stop. You have any signs of a stroke. "BE FAST" is an easy way to remember the main warning signs: B - Balance. Dizziness, sudden trouble walking, or loss of balance. E - Eyes. Trouble seeing or a change in  how you see. F - Face. Sudden weakness or loss of feeling in the face. The face or eyelid may droop on one side. A - Arms.Weakness or loss of feeling in an arm. This happens suddenly and usually on one side of the body. S - Speech. Sudden trouble speaking, slurred speech, or trouble understanding what people say. T - Time.Time to call emergency services. Write down what time symptoms  started. You have other signs of a stroke, such as: A sudden, very bad headache with no known cause. Feeling like you may vomit (nausea). Vomiting. A seizure. These symptoms may be an emergency. Get help right away. Call 911. Do not wait to see if the symptoms will go away. Do not drive yourself to the hospital. This information is not intended to replace advice given to you by your health care provider. Make sure you discuss any questions you have with your health care provider. Document Revised: 03/06/2022 Document Reviewed: 03/06/2022 Elsevier Patient Education  2024 [employer].

## 2023-06-24 NOTE — Progress Notes (Addendum)
Primary Care Physician: Ruthell Rummage, PA-C Primary Cardiologist: None Electrophysiologist: None     Referring Physician: Device clinic     Adrian Rodgers is a 82 y.o. 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 Pylori, tobacco use, BPH, HLD, and paroxysmal atrial fibrillation who presents for consultation in the [facility]. Device clinic alert on 12/23 for new onset Afib noted on loop recorder. He is on plavix per Neurology for secondary stroke prevention. Patient has a CHADS2VASC score of 5.  On evaluation today, he is currently in NSR. ILR review shows Afib episodes on 12/15 and 12/16 with longest episode 2 hours in duration. He does not have cardiac awareness of Afib. He was not doing anything particularly strenuous those days; he admits to overall being not very physically active. He does not drink alcohol. He drinks 2 cups of coffee daily. He lives alone so unaware if he snores or stops breathing at night.  Today, he denies symptoms of palpitations, chest pain, shortness of breath, orthopnea, PND, lower extremity edema, dizziness, presyncope, syncope, snoring, daytime somnolence, bleeding, or neurologic sequela. The patient is tolerating medications without difficulties and is otherwise without complaint today.   he has a BMI of Body mass index is 25.23 kg/m.Marland Kitchen Filed Weights   06/24/23 1124  Weight: 84.4 kg    Current Outpatient Medications  Medication Sig Dispense Refill   acetaminophen (TYLENOL) 500 MG tablet Take 1,000 mg by mouth as needed for moderate pain, mild pain, fever or headache.     albuterol (PROVENTIL HFA;VENTOLIN HFA) 108 (90 Base) MCG/ACT inhaler Inhale 2 puffs into the lungs every 6 (six) hours as needed for wheezing or shortness of breath. (Patient taking differently: Inhale 2 puffs into the lungs as needed for wheezing or shortness of breath.) 1 Inhaler 2   apixaban (ELIQUIS) 5 MG TABS tablet Take 1  tablet (5 mg total) by mouth 2 (two) times daily. 60 tablet 3   atorvastatin (LIPITOR) 80 MG tablet Take 1 tablet (80 mg total) by mouth daily.     BREZTRI AEROSPHERE 160-9-4.8 MCG/ACT AERO Inhale 2 puffs into the lungs 2 (two) times daily.     losartan (COZAAR) 50 MG tablet Take 50 mg by mouth daily.     Multiple Vitamins-Minerals (CENTRUM SILVER 50+MEN) TABS Take 1 tablet by mouth daily with breakfast.     pantoprazole (PROTONIX) 20 MG tablet Take 1 tablet (20 mg total) by mouth daily. 30 tablet 6   polyethylene glycol (MIRALAX) 17 g packet Take 17 g by mouth daily. 14 each 0   tamsulosin (FLOMAX) 0.4 MG CAPS capsule Take 1 capsule (0.4 mg total) by mouth daily. 30 capsule 0   No current facility-administered medications for this encounter.    Atrial Fibrillation Management history:  Previous antiarrhythmic drugs: none Previous cardioversions: none Previous ablations: none Anticoagulation history: none   ROS- All systems are reviewed and negative except as per the HPI above.  Physical Exam: BP 132/82   Pulse 85   Ht 6' (1.829 m)   Wt 84.4 kg   BMI 25.23 kg/m   GEN: Well nourished, well developed in no acute distress NECK: No JVD; No carotid bruits CARDIAC: Regular rate and rhythm, no murmurs, rubs, gallops RESPIRATORY:  Clear to auscultation without rales, wheezing or rhonchi  ABDOMEN: Soft, non-tender, non-distended EXTREMITIES:  No edema; No deformity   EKG today demonstrates  Vent. rate 85 BPM PR interval 184 ms QRS duration  102 ms QT/QTcB 388/461 ms P-R-T axes -19 103 56 Normal sinus rhythm Rightward axis Incomplete right bundle branch block Borderline ECG When compared with ECG of 24-Jun-2023 11:35, PREVIOUS ECG IS PRESENT  Echo 04/09/22 demonstrated  1. Left ventricular ejection fraction, by estimation, is 70 to 75%. The  left ventricle has hyperdynamic function. The left ventricle has no  regional wall motion abnormalities. There is mild concentric left   ventricular hypertrophy. Left ventricular  diastolic parameters are consistent with Grade I diastolic dysfunction  (impaired relaxation). Intracavitary gradient due to hyperdynamic  function.   2. Right ventricular systolic function is hyperdynamic. The right  ventricular size is normal. Tricuspid regurgitation signal is inadequate  for assessing PA pressure.   3. The mitral valve is normal in structure. No evidence of mitral valve  regurgitation. No evidence of mitral stenosis.   4. The aortic valve was not well visualized. Aortic valve regurgitation  is not visualized. No aortic stenosis is present.   5. Aortic dilatation noted. There is mild dilatation of the ascending  aorta, measuring 42 mm.   6. The inferior vena cava is normal in size with greater than 50%  respiratory variability, suggesting right atrial pressure of 3 mmHg.   ASSESSMENT & PLAN CHA2DS2-VASc Score = 5  The patient's score is based upon: CHF History: 0 HTN History: 1 Diabetes History: 0 Stroke History: 2 Vascular Disease History: 0 Age Score: 2 Gender Score: 0       ASSESSMENT AND PLAN: Paroxysmal Atrial Fibrillation (ICD10:  I48.0) The patient's CHA2DS2-VASc score is 5, indicating a 7.2% annual risk of stroke.    He is currently in NSR. Education provided about Afib. Discussion about medication treatments and ablation going forward if indicated. After discussion, we will proceed with conservative observation at this time via ILR. Will have him f/u in 1 month to assess burden; if he has increased burden noted and high ventricular rates will likely begin Lopressor 12.5 mg BID at that time. Rhythm monitoring device recommended.    Secondary Hypercoagulable State (ICD10:  D68.69) The patient is at significant risk for stroke/thromboembolism based upon his CHA2DS2-VASc Score of 5.  Start Apixaban (Eliquis).  After discussion of risks vs benefits of anticoagulation to prevent stroke secondary to atrial  fibrillation, patient wishes to begin anticoagulation. Will stop plavix and begin Eliquis 5 mg BID. Dosage is correct based on weight > 60 kg and creatinine < 1.5.     Follow up in 1 month for repeat CBC.    Lake Bells, PA-C  Afib Clinic [facility] [address] [place], [place] [address] [phone]

## 2023-06-27 ENCOUNTER — Ambulatory Visit (HOSPITAL_COMMUNITY): Payer: Medicare Other | Admitting: Internal Medicine

## 2023-07-07 ENCOUNTER — Ambulatory Visit (INDEPENDENT_AMBULATORY_CARE_PROVIDER_SITE_OTHER): Payer: Medicare Other

## 2023-07-07 DIAGNOSIS — I639 Cerebral infarction, unspecified: Secondary | ICD-10-CM

## 2023-07-09 LAB — CUP PACEART REMOTE DEVICE CHECK
Date Time Interrogation Session: 20250105232657
Implantable Pulse Generator Implant Date: 20211206

## 2023-07-18 ENCOUNTER — Other Ambulatory Visit: Payer: Self-pay | Admitting: Physician Assistant

## 2023-07-25 ENCOUNTER — Ambulatory Visit (HOSPITAL_COMMUNITY)
Admission: RE | Admit: 2023-07-25 | Discharge: 2023-07-25 | Disposition: A | Discharge status: Home / Self Care | Payer: Medicare Other | Source: Ambulatory Visit | Attending: Internal Medicine | Admitting: Internal Medicine

## 2023-07-25 VITALS — BP 122/78 | HR 88 | Ht 72.0 in | Wt 187.8 lb

## 2023-07-25 DIAGNOSIS — Z79899 Other long term (current) drug therapy: Secondary | ICD-10-CM | POA: Diagnosis not present

## 2023-07-25 DIAGNOSIS — I480 Paroxysmal atrial fibrillation: Secondary | ICD-10-CM | POA: Insufficient documentation

## 2023-07-25 DIAGNOSIS — I4510 Unspecified right bundle-branch block: Secondary | ICD-10-CM | POA: Diagnosis not present

## 2023-07-25 DIAGNOSIS — B9681 Helicobacter pylori [H. pylori] as the cause of diseases classified elsewhere: Secondary | ICD-10-CM | POA: Insufficient documentation

## 2023-07-25 DIAGNOSIS — E785 Hyperlipidemia, unspecified: Secondary | ICD-10-CM | POA: Diagnosis not present

## 2023-07-25 DIAGNOSIS — Z95811 Presence of heart assist device: Secondary | ICD-10-CM | POA: Insufficient documentation

## 2023-07-25 DIAGNOSIS — Z8673 Personal history of transient ischemic attack (TIA), and cerebral infarction without residual deficits: Secondary | ICD-10-CM | POA: Insufficient documentation

## 2023-07-25 DIAGNOSIS — Z7901 Long term (current) use of anticoagulants: Secondary | ICD-10-CM | POA: Insufficient documentation

## 2023-07-25 DIAGNOSIS — I10 Essential (primary) hypertension: Secondary | ICD-10-CM | POA: Insufficient documentation

## 2023-07-25 DIAGNOSIS — K259 Gastric ulcer, unspecified as acute or chronic, without hemorrhage or perforation: Secondary | ICD-10-CM | POA: Diagnosis not present

## 2023-07-25 DIAGNOSIS — Z720 Tobacco use: Secondary | ICD-10-CM | POA: Insufficient documentation

## 2023-07-25 DIAGNOSIS — N400 Enlarged prostate without lower urinary tract symptoms: Secondary | ICD-10-CM | POA: Diagnosis not present

## 2023-07-25 DIAGNOSIS — D6869 Other thrombophilia: Secondary | ICD-10-CM | POA: Diagnosis not present

## 2023-07-25 DIAGNOSIS — J449 Chronic obstructive pulmonary disease, unspecified: Secondary | ICD-10-CM | POA: Insufficient documentation

## 2023-07-25 LAB — CBC
HCT: 45 % (ref 39.0–52.0)
Hemoglobin: 14.8 g/dL (ref 13.0–17.0)
MCH: 31 pg (ref 26.0–34.0)
MCHC: 32.9 g/dL (ref 30.0–36.0)
MCV: 94.1 fL (ref 80.0–100.0)
Platelets: 381 10*3/uL (ref 150–400)
RBC: 4.78 MIL/uL (ref 4.22–5.81)
RDW: 14.8 % (ref 11.5–15.5)
WBC: 6.2 10*3/uL (ref 4.0–10.5)
nRBC: 0 % (ref 0.0–0.2)

## 2023-07-25 NOTE — Progress Notes (Signed)
Primary Care Physician: Ruthell Rummage, PA-C Primary Cardiologist: None Electrophysiologist: None     Referring Physician: Device clinic    Adrian Rodgers is a 83 y.o. 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 Pylori, tobacco use, BPH, HLD, and paroxysmal atrial fibrillation who presents for consultation in the [facility]. Device clinic alert on 12/23 for new onset Afib noted on loop recorder. He is on plavix per Neurology for secondary stroke prevention. Patient has a CHADS2VASC score of 5.  On evaluation today, he is currently in NSR. ILR review shows Afib episodes on 12/15 and 12/16 with longest episode 2 hours in duration. He does not have cardiac awareness of Afib. He was not doing anything particularly strenuous those days; he admits to overall being not very physically active. He does not drink alcohol. He drinks 2 cups of coffee daily. He lives alone so unaware if he snores or stops breathing at night.  On follow up 07/25/23, he is currently in NSR. ILR review shows no new Afib episodes since 07/01/23. He is currently on Eliquis 5 mg BID and does not have any bleeding issues.   Today, he denies symptoms of palpitations, chest pain, shortness of breath, orthopnea, PND, lower extremity edema, dizziness, presyncope, syncope, snoring, daytime somnolence, bleeding, or neurologic sequela. The patient is tolerating medications without difficulties and is otherwise without complaint today.   he has a BMI of Body mass index is 25.47 kg/m.Marland Kitchen Filed Weights   07/25/23 1204  Weight: 85.2 kg     Current Outpatient Medications  Medication Sig Dispense Refill   acetaminophen (TYLENOL) 500 MG tablet Take 1,000 mg by mouth as needed for moderate pain, mild pain, fever or headache.     albuterol (PROVENTIL HFA;VENTOLIN HFA) 108 (90 Base) MCG/ACT inhaler Inhale 2 puffs into the lungs every 6 (six) hours as needed for wheezing or shortness  of breath. (Patient taking differently: Inhale 2 puffs into the lungs as needed for wheezing or shortness of breath.) 1 Inhaler 2   apixaban (ELIQUIS) 5 MG TABS tablet Take 1 tablet (5 mg total) by mouth 2 (two) times daily. 60 tablet 3   atorvastatin (LIPITOR) 80 MG tablet Take 1 tablet (80 mg total) by mouth daily.     BREZTRI AEROSPHERE 160-9-4.8 MCG/ACT AERO Inhale 2 puffs into the lungs 2 (two) times daily.     losartan (COZAAR) 50 MG tablet Take 50 mg by mouth daily.     Multiple Vitamins-Minerals (CENTRUM SILVER 50+MEN) TABS Take 1 tablet by mouth daily with breakfast.     pantoprazole (PROTONIX) 20 MG tablet Take 1 tablet (20 mg total) by mouth daily. 30 tablet 6   polyethylene glycol (MIRALAX) 17 g packet Take 17 g by mouth daily. 14 each 0   tamsulosin (FLOMAX) 0.4 MG CAPS capsule Take 1 capsule (0.4 mg total) by mouth daily. 30 capsule 0   No current facility-administered medications for this encounter.    Atrial Fibrillation Management history:  Previous antiarrhythmic drugs: none Previous cardioversions: none Previous ablations: none Anticoagulation history: Eliquis   ROS- All systems are reviewed and negative except as per the HPI above.  Physical Exam: BP 122/78   Pulse 88   Ht 6' (1.829 m)   Wt 85.2 kg   BMI 25.47 kg/m   GEN- The patient is well appearing, alert and oriented x 3 today.   Neck - no JVD or carotid bruit noted Lungs- Clear to ausculation  bilaterally, normal work of breathing Heart- Regular rate and rhythm, no murmurs, rubs or gallops, PMI not laterally displaced Extremities- no clubbing, cyanosis, or edema Skin - no rash or ecchymosis noted   EKG today demonstrates  Vent. rate 88 BPM PR interval 168 ms QRS duration 102 ms QT/QTcB 384/464 ms P-R-T axes * 94 57 Normal sinus rhythm Rightward axis Incomplete right bundle branch block Borderline ECG When compared with ECG of 24-Jun-2023 11:36, PREVIOUS ECG IS PRESENT  Echo 04/09/22  demonstrated  1. Left ventricular ejection fraction, by estimation, is 70 to 75%. The  left ventricle has hyperdynamic function. The left ventricle has no  regional wall motion abnormalities. There is mild concentric left  ventricular hypertrophy. Left ventricular  diastolic parameters are consistent with Grade I diastolic dysfunction  (impaired relaxation). Intracavitary gradient due to hyperdynamic  function.   2. Right ventricular systolic function is hyperdynamic. The right  ventricular size is normal. Tricuspid regurgitation signal is inadequate  for assessing PA pressure.   3. The mitral valve is normal in structure. No evidence of mitral valve  regurgitation. No evidence of mitral stenosis.   4. The aortic valve was not well visualized. Aortic valve regurgitation  is not visualized. No aortic stenosis is present.   5. Aortic dilatation noted. There is mild dilatation of the ascending  aorta, measuring 42 mm.   6. The inferior vena cava is normal in size with greater than 50%  respiratory variability, suggesting right atrial pressure of 3 mmHg.   ASSESSMENT & PLAN CHA2DS2-VASc Score = 5  The patient's score is based upon: CHF History: 0 HTN History: 1 Diabetes History: 0 Stroke History: 2 Vascular Disease History: 0 Age Score: 2 Gender Score: 0       ASSESSMENT AND PLAN: Paroxysmal Atrial Fibrillation (ICD10:  I48.0) The patient's CHA2DS2-VASc score is 5, indicating a 7.2% annual risk of stroke.    He is currently in NSR. ILR review shows no new episodes. Due to low burden, will not at this time begin rate control. We will proceed with conservative observation at this point via subsequent ILR reports. Rhythm monitoring device again recommended.   If he has increased burden noted and high ventricular rates will likely begin Lopressor 12.5 mg BID at that time.    Secondary Hypercoagulable State (ICD10:  D68.69) The patient is at significant risk for  stroke/thromboembolism based upon his CHA2DS2-VASc Score of 5.  Start Apixaban (Eliquis).  Continue Eliquis 5 mg BID without interruption.  CBC drawn today.      Follow up Afib clinic prn.    Lake Bells, PA-C  Afib Clinic [facility] [address] [place], [place] [address] [phone]

## 2023-08-08 ENCOUNTER — Telehealth: Payer: Self-pay

## 2023-08-08 NOTE — Telephone Encounter (Signed)
 ILR alert for tachy, no details available Route to triage LA, CVRS   Will review manual transmission once received. Per tech services EGM unavailable because its been disconnected for an extended period and once it sends through a manual transmission it will push through EGMs w/ it. See previous note from [facility].

## 2023-08-08 NOTE — Telephone Encounter (Signed)
 Lvm for pt to send a remote transmission and left DC direct number to call back

## 2023-08-11 ENCOUNTER — Ambulatory Visit (INDEPENDENT_AMBULATORY_CARE_PROVIDER_SITE_OTHER): Payer: Medicare Other

## 2023-08-11 DIAGNOSIS — I639 Cerebral infarction, unspecified: Secondary | ICD-10-CM | POA: Diagnosis not present

## 2023-08-11 LAB — CUP PACEART REMOTE DEVICE CHECK
Date Time Interrogation Session: 20250209232629
Implantable Pulse Generator Implant Date: 20211206

## 2023-08-13 NOTE — Telephone Encounter (Signed)
Summary report received and exported to covering physician for review.  No further action needed.

## 2023-08-14 ENCOUNTER — Encounter: Payer: Self-pay | Admitting: Cardiology

## 2023-08-18 NOTE — Progress Notes (Signed)
 [facility] Summary Report / Loop Recorder

## 2023-08-18 NOTE — Addendum Note (Signed)
Addended by: Geralyn Flash D on: 08/18/2023 04:33 PM   Modules accepted: Orders

## 2023-08-19 ENCOUNTER — Other Ambulatory Visit (HOSPITAL_COMMUNITY): Payer: Self-pay

## 2023-08-19 MED ORDER — APIXABAN 5 MG PO TABS: 5.0000 mg | ORAL_TABLET | Freq: Two times a day (BID) | ORAL | Status: DC

## 2023-09-15 ENCOUNTER — Ambulatory Visit (INDEPENDENT_AMBULATORY_CARE_PROVIDER_SITE_OTHER): Payer: Medicare Other

## 2023-09-15 DIAGNOSIS — I639 Cerebral infarction, unspecified: Secondary | ICD-10-CM | POA: Diagnosis not present

## 2023-09-16 LAB — CUP PACEART REMOTE DEVICE CHECK
Date Time Interrogation Session: 20250317002708
Implantable Pulse Generator Implant Date: 20211206

## 2023-09-18 NOTE — Progress Notes (Signed)
 [facility] Summary Report / Loop Recorder

## 2023-09-21 ENCOUNTER — Encounter: Payer: Self-pay | Admitting: Cardiology

## 2023-10-20 ENCOUNTER — Ambulatory Visit (INDEPENDENT_AMBULATORY_CARE_PROVIDER_SITE_OTHER): Payer: Medicare Other

## 2023-10-20 DIAGNOSIS — I639 Cerebral infarction, unspecified: Secondary | ICD-10-CM

## 2023-10-20 LAB — CUP PACEART REMOTE DEVICE CHECK
Date Time Interrogation Session: 20250421002133
Implantable Pulse Generator Implant Date: 20211206

## 2023-10-21 ENCOUNTER — Encounter: Payer: Self-pay | Admitting: Cardiology

## 2023-11-05 NOTE — Addendum Note (Signed)
 Addended by: Edra Govern D on: 11/05/2023 04:18 PM   Modules accepted: Orders

## 2023-11-05 NOTE — Progress Notes (Signed)
 [facility] Summary Report / Loop Recorder

## 2023-11-14 ENCOUNTER — Other Ambulatory Visit (HOSPITAL_COMMUNITY): Payer: Self-pay

## 2023-11-14 MED ORDER — APIXABAN 5 MG PO TABS: 5.0000 mg | ORAL_TABLET | Freq: Two times a day (BID) | ORAL | 9 refills | Status: DC

## 2023-11-17 ENCOUNTER — Other Ambulatory Visit (HOSPITAL_COMMUNITY): Payer: Self-pay | Admitting: *Deleted

## 2023-11-17 MED ORDER — APIXABAN 5 MG PO TABS: 5.0000 mg | ORAL_TABLET | Freq: Two times a day (BID) | ORAL | 9 refills | Status: AC

## 2023-11-20 ENCOUNTER — Ambulatory Visit: Payer: Self-pay | Admitting: Cardiology

## 2023-11-20 ENCOUNTER — Ambulatory Visit (INDEPENDENT_AMBULATORY_CARE_PROVIDER_SITE_OTHER)

## 2023-11-20 DIAGNOSIS — I639 Cerebral infarction, unspecified: Secondary | ICD-10-CM | POA: Diagnosis not present

## 2023-11-20 LAB — CUP PACEART REMOTE DEVICE CHECK
Date Time Interrogation Session: 20250521230317
Implantable Pulse Generator Implant Date: 20211206

## 2023-11-25 ENCOUNTER — Encounter (HOSPITAL_BASED_OUTPATIENT_CLINIC_OR_DEPARTMENT_OTHER): Payer: Self-pay | Admitting: Urology

## 2023-11-25 ENCOUNTER — Emergency Department (HOSPITAL_BASED_OUTPATIENT_CLINIC_OR_DEPARTMENT_OTHER)

## 2023-11-25 ENCOUNTER — Observation Stay (HOSPITAL_BASED_OUTPATIENT_CLINIC_OR_DEPARTMENT_OTHER)
Admission: EM | Admit: 2023-11-25 | Discharge: 2023-11-26 | Disposition: A | Discharge status: Home Health | Attending: Family Medicine | Admitting: Family Medicine

## 2023-11-25 ENCOUNTER — Other Ambulatory Visit: Payer: Self-pay

## 2023-11-25 DIAGNOSIS — I480 Paroxysmal atrial fibrillation: Secondary | ICD-10-CM | POA: Diagnosis not present

## 2023-11-25 DIAGNOSIS — Z950 Presence of cardiac pacemaker: Secondary | ICD-10-CM | POA: Insufficient documentation

## 2023-11-25 DIAGNOSIS — I10 Essential (primary) hypertension: Secondary | ICD-10-CM | POA: Diagnosis not present

## 2023-11-25 DIAGNOSIS — E785 Hyperlipidemia, unspecified: Secondary | ICD-10-CM | POA: Diagnosis not present

## 2023-11-25 DIAGNOSIS — H532 Diplopia: Secondary | ICD-10-CM | POA: Diagnosis present

## 2023-11-25 DIAGNOSIS — F17210 Nicotine dependence, cigarettes, uncomplicated: Secondary | ICD-10-CM | POA: Insufficient documentation

## 2023-11-25 DIAGNOSIS — I6381 Other cerebral infarction due to occlusion or stenosis of small artery: Secondary | ICD-10-CM | POA: Diagnosis not present

## 2023-11-25 DIAGNOSIS — J449 Chronic obstructive pulmonary disease, unspecified: Secondary | ICD-10-CM | POA: Insufficient documentation

## 2023-11-25 DIAGNOSIS — N400 Enlarged prostate without lower urinary tract symptoms: Secondary | ICD-10-CM | POA: Diagnosis not present

## 2023-11-25 DIAGNOSIS — Z79899 Other long term (current) drug therapy: Secondary | ICD-10-CM | POA: Insufficient documentation

## 2023-11-25 DIAGNOSIS — Z7901 Long term (current) use of anticoagulants: Secondary | ICD-10-CM | POA: Insufficient documentation

## 2023-11-25 DIAGNOSIS — H538 Other visual disturbances: Secondary | ICD-10-CM | POA: Diagnosis present

## 2023-11-25 DIAGNOSIS — I639 Cerebral infarction, unspecified: Secondary | ICD-10-CM | POA: Diagnosis present

## 2023-11-25 DIAGNOSIS — Z720 Tobacco use: Secondary | ICD-10-CM | POA: Diagnosis present

## 2023-11-25 LAB — BASIC METABOLIC PANEL WITH GFR
Anion gap: 11 (ref 5–15)
BUN: 11 mg/dL (ref 8–23)
CO2: 26 mmol/L (ref 22–32)
Calcium: 9.2 mg/dL (ref 8.9–10.3)
Chloride: 108 mmol/L (ref 98–111)
Creatinine, Ser: 0.97 mg/dL (ref 0.61–1.24)
GFR, Estimated: >60 mL/min (ref >60)
Glucose, Bld: 84 mg/dL (ref 70–99)
Potassium: 3.5 mmol/L (ref 3.5–5.1)
Sodium: 145 mmol/L (ref 135–145)

## 2023-11-25 LAB — CBC WITH DIFFERENTIAL/PLATELET
Abs Immature Granulocytes: 0.01 10*3/uL (ref 0.00–0.07)
Basophils Absolute: 0 10*3/uL (ref 0.0–0.1)
Basophils Relative: 1 %
Eosinophils Absolute: 0.3 10*3/uL (ref 0.0–0.5)
Eosinophils Relative: 6 %
HCT: 42.5 % (ref 39.0–52.0)
Hemoglobin: 14 g/dL (ref 13.0–17.0)
Immature Granulocytes: 0 %
Lymphocytes Relative: 30 %
Lymphs Abs: 1.5 10*3/uL (ref 0.7–4.0)
MCH: 30.8 pg (ref 26.0–34.0)
MCHC: 32.9 g/dL (ref 30.0–36.0)
MCV: 93.4 fL (ref 80.0–100.0)
Monocytes Absolute: 0.4 10*3/uL (ref 0.1–1.0)
Monocytes Relative: 8 %
Neutro Abs: 2.8 10*3/uL (ref 1.7–7.7)
Neutrophils Relative %: 55 %
Platelets: 367 10*3/uL (ref 150–400)
RBC: 4.55 MIL/uL (ref 4.22–5.81)
RDW: 14.6 % (ref 11.5–15.5)
WBC: 5.1 10*3/uL (ref 4.0–10.5)
nRBC: 0 % (ref 0.0–0.2)

## 2023-11-25 MED ORDER — IOHEXOL 350 MG/ML SOLN
75.0000 mL | Freq: Once | INTRAVENOUS | Status: AC | PRN
  Administered 2023-11-25: 75 mL via INTRAVENOUS

## 2023-11-25 NOTE — ED Triage Notes (Signed)
 Pt states states woke up this am with left eye double vision Noticed left eye deviation LKW was 5/26 at 2030 Denies pain No other neuro symptoms noted    H/o stroke

## 2023-11-25 NOTE — ED Provider Notes (Signed)
 [facility] Provider Note   CSN: [id number] Arrival date & time: 11/25/23  1934     History  Chief Complaint  Patient presents with   Eye Problem    Adrian Rodgers is a 83 y.o. male.  He is here with complaint of double vision.  He has a history of COPD, A-fib on Eliquis , stroke on Plavix , intracranial aneurysm.  He woke up this morning with double vision.  No headache eye pain numbness or weakness.  No chest pain or shortness of breath.  Never had the symptoms before.  The history is provided by the patient.  Eye Problem Location:  Left eye Quality: double vision. Timing:  Constant Progression:  Unchanged Chronicity:  New Associated symptoms: double vision   Associated symptoms: no headaches, no nausea, no photophobia and no weakness        Home Medications Prior to Admission medications   Medication Sig Start Date End Date Taking? Authorizing Provider  acetaminophen  (TYLENOL ) 500 MG tablet Take 1,000 mg by mouth as needed for moderate pain, mild pain, fever or headache.    [provider]  albuterol  (PROVENTIL  HFA;VENTOLIN  HFA) 108 (90 Base) MCG/ACT inhaler Inhale 2 puffs into the lungs every 6 (six) hours as needed for wheezing or shortness of breath. Patient taking differently: Inhale 2 puffs into the lungs as needed for wheezing or shortness of breath. 09/21/17   Purohit, Michial Akin, MD  apixaban  (ELIQUIS ) 5 MG TABS tablet Take 1 tablet (5 mg total) by mouth 2 (two) times daily. 11/17/23   Nathanel Bal, PA-C  atorvastatin  (LIPITOR ) 80 MG tablet Take 1 tablet (80 mg total) by mouth daily. 04/03/22   Krishnan, Gokul, MD  BREZTRI AEROSPHERE 160-9-4.8 MCG/ACT AERO Inhale 2 puffs into the lungs 2 (two) times daily.    [provider]  losartan  (COZAAR ) 50 MG tablet Take 50 mg by mouth daily. 12/06/22 12/06/23  [provider]  Multiple Vitamins-Minerals (CENTRUM SILVER 50+MEN) TABS Take 1 tablet by mouth daily  with breakfast.    [provider]  pantoprazole  (PROTONIX ) 20 MG tablet Take 1 tablet (20 mg total) by mouth daily. 10/29/22   Armbruster, Lendon Queen, MD  polyethylene glycol (MIRALAX ) 17 g packet Take 17 g by mouth daily. 12/14/22   Rancour, Mara Seminole, MD  tamsulosin  (FLOMAX ) 0.4 MG CAPS capsule Take 1 capsule (0.4 mg total) by mouth daily. 04/14/22   Lavaughn Portland, MD      Allergies    Patient has no known allergies.    Review of Systems   Review of Systems  Constitutional:  Negative for fever.  Eyes:  Positive for double vision. Negative for photophobia.  Respiratory:  Negative for shortness of breath.   Cardiovascular:  Negative for chest pain.  Gastrointestinal:  Negative for nausea.  Neurological:  Negative for speech difficulty, weakness and headaches.    Physical Exam Updated Vital Signs BP (!) 143/101 (BP Location: Right Arm)   Pulse 84   Temp 97.9 F (36.6 C)   Resp 20   Ht 6' (1.829 m)   Wt 85.2 kg   SpO2 92%   BMI 25.47 kg/m  Physical Exam Vitals and nursing note reviewed.  Constitutional:      General: He is not in acute distress.    Appearance: Normal appearance. He is well-developed.  HENT:     Head: Normocephalic and atraumatic.  Eyes:     Conjunctiva/sclera: Conjunctivae normal.  Cardiovascular:  Rate and Rhythm: Normal rate and regular rhythm.     Heart sounds: No murmur heard. Pulmonary:     Effort: Pulmonary effort is normal. No respiratory distress.     Breath sounds: Normal breath sounds.  Abdominal:     Palpations: Abdomen is soft.     Tenderness: There is no abdominal tenderness.  Musculoskeletal:        General: No swelling.     Cervical back: Neck supple.  Skin:    General: Skin is warm and dry.     Capillary Refill: Capillary refill takes less than 2 seconds.  Neurological:     Mental Status: He is alert.     Cranial Nerves: Cranial nerve deficit present.     Sensory: No sensory deficit.     Motor: No weakness.     Comments:  Left eye does not track past the midline on medial movement.     ED Results / Procedures / Treatments   Labs (all labs ordered are listed, but only abnormal results are displayed) Labs Reviewed  BASIC METABOLIC PANEL WITH GFR  CBC WITH DIFFERENTIAL/PLATELET    EKG EKG Interpretation Date/Time:  Tuesday Nov 25 2023 20:25:47 EDT Ventricular Rate:  69 PR Interval:  201 QRS Duration:  109 QT Interval:  428 QTC Calculation: 459 R Axis:   170  Text Interpretation: Sinus or ectopic atrial rhythm Right axis deviation ST elevation, consider inferior injury No significant change since prior 1/25 Confirmed by Racheal Buddle [phone]) on 11/25/2023 11:01:20 PM  Radiology CT ANGIO HEAD NECK W WO CM Result Date: 11/25/2023 CLINICAL DATA:  Stroke/TIA.  Epiploic via. EXAM: CT ANGIOGRAPHY HEAD AND NECK WITH AND WITHOUT CONTRAST TECHNIQUE: Multidetector CT imaging of the head and neck was performed using the standard protocol during bolus administration of intravenous contrast. Multiplanar CT image reconstructions and MIPs were obtained to evaluate the vascular anatomy. Carotid stenosis measurements (when applicable) are obtained utilizing NASCET criteria, using the distal internal carotid diameter as the denominator. RADIATION DOSE REDUCTION: This exam was performed according to the departmental dose-optimization program which includes automated exposure control, adjustment of the mA and/or kV according to patient size and/or use of iterative reconstruction technique. CONTRAST:  75mL OMNIPAQUE  IOHEXOL  350 MG/ML SOLN COMPARISON:  04/08/2022, 03/25/2021 FINDINGS: CT HEAD FINDINGS Brain: There is no mass, hemorrhage or extra-axial collection. There is generalized atrophy without lobar predilection. Hypodensity of the white matter is most commonly associated with chronic microvascular disease. Bilateral basal ganglia mineralization. Vascular: No hyperdense vessel or unexpected vascular calcification. Skull: The  visualized skull base, calvarium and extracranial soft tissues are normal. Sinuses/Orbits: No fluid levels or advanced mucosal thickening of the visualized paranasal sinuses. No mastoid or middle ear effusion. Normal orbits. CTA NECK FINDINGS Skeleton: No acute abnormality or high grade bony spinal canal stenosis. Other neck: Normal pharynx, larynx and major salivary glands. No cervical lymphadenopathy. Unremarkable thyroid gland. Upper chest: Biapical emphysema Aortic arch: There is calcific atherosclerosis of the aortic arch. Conventional 3 vessel aortic branching pattern. RIGHT carotid system: Normal without aneurysm, dissection or stenosis. LEFT carotid system: Normal without aneurysm, dissection or stenosis. Vertebral arteries: Codominant configuration. There is no dissection, occlusion or flow-limiting stenosis to the skull base (V1-V3 segments). CTA HEAD FINDINGS POSTERIOR CIRCULATION: Vertebral arteries are normal. No proximal occlusion of the anterior or inferior cerebellar arteries. Basilar artery is normal. Superior cerebellar arteries are normal. Posterior cerebral arteries are normal. ANTERIOR CIRCULATION: Intracranial internal carotid arteries are normal. Anterior cerebral arteries are normal. Middle  cerebral arteries are normal. Venous sinuses: As permitted by contrast timing, patent. Anatomic variants: None Review of the MIP images confirms the above findings. IMPRESSION: No emergent large vessel occlusion or high-grade stenosis of the intracranial arteries. Aortic Atherosclerosis (ICD10-I70.0) and Emphysema (ICD10-J43.9). Electronically Signed   By: Juanetta Nordmann M.D.   On: 11/25/2023 22:16    Procedures Procedures    Medications Ordered in ED Medications  iohexol  (OMNIPAQUE ) 350 MG/ML injection 75 mL (75 mLs Intravenous Contrast Given 11/25/23 2159)    ED Course/ Medical Decision Making/ A&P Clinical Course as of 11/25/23 2331  Tue Nov 25, 2023  2027 Reviewed case with neurology  on-call Dr. Renaee Caro.  He said the patient will need a CTA head and neck and then will need admission to get an MRI with and without contrast. [MB]  2331 Discussed with [facility] hospitalist Dr. Sundil who will put the patient in for a bed at [facility]. [MB]    Clinical Course User Index [MB] Tonya Fredrickson, MD                                 Medical Decision Making Amount and/or Complexity of Data Reviewed Labs: ordered. Radiology: ordered.  Risk Prescription drug management. Decision regarding hospitalization.   This patient complains of double vision; this involves an extensive number of treatment Options and is a complaint that carries with it a high risk of complications and morbidity. The differential includes stroke, bleed, muscle imbalance  I ordered, reviewed and interpreted labs, which included CBC and chemistries unremarkable I ordered imaging studies which included CT angio head and neck and I independently    visualized and interpreted imaging which showed no large stroke or large vessel occlusion Additional history obtained from patient's family members Previous records obtained and reviewed in epic including recent cardiology notes I consulted neurology Dr. Renaee Caro and [facility] hospitalist and discussed lab and imaging findings and discussed disposition.  Cardiac monitoring reviewed, sinus rhythm Social determinants considered, tobacco use Critical Interventions: None  After the interventions stated above, I reevaluated the patient and found fairly asymptomatic other than the double vision Admission and further testing considered, he would benefit from mission to the hospital for MRI and further neurology workup.  Patient in agreement with plan for admission         Final Clinical Impression(s) / ED Diagnoses Final diagnoses:  Binocular vision disorder with diplopia    Rx / DC Orders ED Discharge Orders     None         Tonya Fredrickson, MD 11/25/23  2332

## 2023-11-25 NOTE — Plan of Care (Addendum)
 [facility] ED to [facility] transfer medical telemetry unit:  83 year old man history of COPD, hypertension, CVA, gastric ulcer, chronic smoking cigarette, hyperlipidemia and paroxysmal atrial fibrillation on Eliquis  presented to emergency department with complaining double vision of the left eye and left eye deviation. Last known well at 8:30am 11/25/23.  Other neuroexam is nonfocal. Patient is already on Eliquis  at home   At presentation to ED patient is hemodynamically stable. CT angio head and neck no large vessel occlusion or high-grade stenosis. CBC and CMP unremarkable.  Dr. Randal Bury discussed case with neurology Dr.Lindzen who recommended CTA head and neck and then admission to get MRI with and without contrast.    Hospitalist has been consulted for further evaluation for left eye double vision and admission for stroke workup. Please inform on-call neurology upon arrival to [facility].

## 2023-11-26 ENCOUNTER — Observation Stay (HOSPITAL_COMMUNITY)

## 2023-11-26 ENCOUNTER — Other Ambulatory Visit (HOSPITAL_COMMUNITY): Payer: Self-pay

## 2023-11-26 ENCOUNTER — Encounter (HOSPITAL_BASED_OUTPATIENT_CLINIC_OR_DEPARTMENT_OTHER): Payer: Self-pay | Admitting: Internal Medicine

## 2023-11-26 DIAGNOSIS — I639 Cerebral infarction, unspecified: Secondary | ICD-10-CM | POA: Diagnosis not present

## 2023-11-26 DIAGNOSIS — I739 Peripheral vascular disease, unspecified: Secondary | ICD-10-CM | POA: Diagnosis not present

## 2023-11-26 DIAGNOSIS — Z79899 Other long term (current) drug therapy: Secondary | ICD-10-CM | POA: Diagnosis not present

## 2023-11-26 DIAGNOSIS — N400 Enlarged prostate without lower urinary tract symptoms: Secondary | ICD-10-CM | POA: Diagnosis not present

## 2023-11-26 DIAGNOSIS — F17210 Nicotine dependence, cigarettes, uncomplicated: Secondary | ICD-10-CM | POA: Diagnosis not present

## 2023-11-26 DIAGNOSIS — I6389 Other cerebral infarction: Secondary | ICD-10-CM | POA: Diagnosis not present

## 2023-11-26 DIAGNOSIS — I10 Essential (primary) hypertension: Secondary | ICD-10-CM

## 2023-11-26 DIAGNOSIS — E785 Hyperlipidemia, unspecified: Secondary | ICD-10-CM

## 2023-11-26 DIAGNOSIS — I4891 Unspecified atrial fibrillation: Secondary | ICD-10-CM

## 2023-11-26 DIAGNOSIS — J449 Chronic obstructive pulmonary disease, unspecified: Secondary | ICD-10-CM | POA: Diagnosis not present

## 2023-11-26 DIAGNOSIS — H532 Diplopia: Secondary | ICD-10-CM

## 2023-11-26 DIAGNOSIS — I6381 Other cerebral infarction due to occlusion or stenosis of small artery: Secondary | ICD-10-CM | POA: Diagnosis not present

## 2023-11-26 DIAGNOSIS — I480 Paroxysmal atrial fibrillation: Secondary | ICD-10-CM

## 2023-11-26 DIAGNOSIS — Q2112 Patent foramen ovale: Secondary | ICD-10-CM

## 2023-11-26 DIAGNOSIS — Z7901 Long term (current) use of anticoagulants: Secondary | ICD-10-CM

## 2023-11-26 DIAGNOSIS — Z720 Tobacco use: Secondary | ICD-10-CM

## 2023-11-26 DIAGNOSIS — Z950 Presence of cardiac pacemaker: Secondary | ICD-10-CM | POA: Diagnosis not present

## 2023-11-26 LAB — LIPID PANEL
Cholesterol: 135 mg/dL (ref 0–200)
HDL: 40 mg/dL — ABNORMAL LOW (ref >40)
LDL Cholesterol: 85 mg/dL (ref 0–99)
Total CHOL/HDL Ratio: 3.4 ratio
Triglycerides: 50 mg/dL (ref <150)
VLDL: 10 mg/dL (ref 0–40)

## 2023-11-26 LAB — ECHOCARDIOGRAM COMPLETE
AR max vel: 2.73 cm2
AV Area VTI: 2.68 cm2
AV Area mean vel: 2.58 cm2
AV Mean grad: 2 mmHg
AV Peak grad: 4 mmHg
AV Vena cont: 0.4 cm
Ao pk vel: 0.99 m/s
Area-P 1/2: 2.65 cm2
Height: 72 in
P 1/2 time: 339 ms
S' Lateral: 2.6 cm
Weight: 2939.2 [oz_av]

## 2023-11-26 LAB — HEMOGLOBIN A1C
Hgb A1c MFr Bld: 5.8 % — ABNORMAL HIGH (ref 4.8–5.6)
Mean Plasma Glucose: 119.76 mg/dL

## 2023-11-26 MED ORDER — SENNOSIDES-DOCUSATE SODIUM 8.6-50 MG PO TABS: 1.0000 | ORAL_TABLET | Freq: Every evening | ORAL | Status: DC | PRN

## 2023-11-26 MED ORDER — SODIUM CHLORIDE 0.9 % IV SOLN: INTRAVENOUS | Status: DC

## 2023-11-26 MED ORDER — GADOBUTROL 1 MMOL/ML IV SOLN
8.0000 mL | Freq: Once | INTRAVENOUS | Status: AC | PRN
  Administered 2023-11-26: 8 mL via INTRAVENOUS

## 2023-11-26 MED ORDER — ACETAMINOPHEN 650 MG RE SUPP: 650.0000 mg | RECTAL | Status: DC | PRN

## 2023-11-26 MED ORDER — ATORVASTATIN CALCIUM 80 MG PO TABS
80.0000 mg | ORAL_TABLET | Freq: Every day | ORAL | Status: DC
  Administered 2023-11-26: 80 mg via ORAL
  Filled 2023-11-26: qty 1

## 2023-11-26 MED ORDER — ASPIRIN 81 MG PO TBEC
81.0000 mg | DELAYED_RELEASE_TABLET | Freq: Every day | ORAL | 0 refills | Status: AC
  Filled 2023-11-26: qty 90, 90d supply, fill #0

## 2023-11-26 MED ORDER — ASPIRIN 81 MG PO TBEC
81.0000 mg | DELAYED_RELEASE_TABLET | Freq: Every day | ORAL | Status: DC
  Administered 2023-11-26: 81 mg via ORAL
  Filled 2023-11-26: qty 1

## 2023-11-26 MED ORDER — TAMSULOSIN HCL 0.4 MG PO CAPS
0.4000 mg | ORAL_CAPSULE | Freq: Every day | ORAL | Status: DC
  Administered 2023-11-26: 0.4 mg via ORAL
  Filled 2023-11-26: qty 1

## 2023-11-26 MED ORDER — STROKE: EARLY STAGES OF RECOVERY BOOK: Freq: Once | Status: DC

## 2023-11-26 MED ORDER — ACETAMINOPHEN 160 MG/5ML PO SOLN: 650.0000 mg | ORAL | Status: DC | PRN

## 2023-11-26 MED ORDER — BUDESON-GLYCOPYRROL-FORMOTEROL 160-9-4.8 MCG/ACT IN AERO
2.0000 | INHALATION_SPRAY | Freq: Two times a day (BID) | RESPIRATORY_TRACT | Status: DC
  Filled 2023-11-26: qty 5.9

## 2023-11-26 MED ORDER — APIXABAN 5 MG PO TABS
5.0000 mg | ORAL_TABLET | Freq: Two times a day (BID) | ORAL | Status: DC
  Administered 2023-11-26: 5 mg via ORAL
  Filled 2023-11-26: qty 1

## 2023-11-26 MED ORDER — EZETIMIBE 10 MG PO TABS
10.0000 mg | ORAL_TABLET | Freq: Every day | ORAL | 0 refills | Status: AC
  Filled 2023-11-26: qty 90, 90d supply, fill #0

## 2023-11-26 MED ORDER — ACETAMINOPHEN 325 MG PO TABS: 650.0000 mg | ORAL_TABLET | ORAL | Status: DC | PRN

## 2023-11-26 MED ORDER — EZETIMIBE 10 MG PO TABS
10.0000 mg | ORAL_TABLET | Freq: Every day | ORAL | Status: DC
  Administered 2023-11-26: 10 mg via ORAL
  Filled 2023-11-26: qty 1

## 2023-11-26 NOTE — ED Notes (Signed)
 [facility] called for transport.

## 2023-11-26 NOTE — Progress Notes (Addendum)
 Occupational Therapy Treatment Patient Details Name: Adrian Rodgers MRN: [id number] DOB: 1940/07/28 Today's Date: 11/26/2023   History of present illness Pt is a 83 year old male who presents 11/25/23 from [facility] due to experience double vision in his L eye. CT negative. 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  PMH significant for  hyperlipidemia, gastric ulcer, H. pylori, hypertension, CVA, PNA and tobacco abuse.   OT comments  Pt seen for administration of occlusion glasses for L INO. Pt reporting double vision in L eye only and often keeps L eye closed to compensate. Pt provided with blank glasses and occluded 1/3 nasal portion of L eye initially, pt still reported diplopia, occluded 1/3 nasal portion bilaterally, and pt reports improvement in diplopia symptoms, states "this is a lot better" and is able to read short paragraph easily with glasses donned. Educated on reason for taping and administered handout with instructions to remove tape from outside in bilaterally every day or two as he can tolerate. Pt verbalized understanding, states he will also have his daughter at home for support. Pt presenting with impairments listed below, will follow acutely. Continue to recommend OP OT (vision) at d/c.      If plan is discharge home, recommend the following:  A little help with walking and/or transfers;A little help with bathing/dressing/bathroom;Assist for transportation;Help with stairs or ramp for entrance;Direct supervision/assist for medications management;Direct supervision/assist for financial management;Assistance with cooking/housework   Equipment Recommendations  None recommended by OT    Recommendations for Other Services      Precautions / Restrictions Precautions Precautions:  Fall Recall of Precautions/Restrictions: Intact Restrictions Weight Bearing Restrictions Per Provider Order: No       Mobility Bed Mobility               General bed mobility comments: OOB in chair upon arrival    Transfers Overall transfer level: Needs assistance Equipment used: Straight cane Transfers: Sit to/from Stand Sit to Stand: Contact guard assist           General transfer comment: CGA ambulating to transport chair for d/c     Balance Overall balance assessment: Needs assistance Sitting-balance support: Feet supported Sitting balance-Leahy Scale: Good     Standing balance support: Bilateral upper extremity supported, Single extremity supported Standing balance-Leahy Scale: Fair                             ADL either performed or assessed with clinical judgement   ADL                                         General ADL Comments: pt seen for vision assessment and occlusion glasses    Extremity/Trunk Assessment Upper Extremity Assessment LUE Deficits / Details: Pt reported at baseline LUE/Le are slightly weaker versus R side due to past hx of stroke but [facility]   Lower Extremity Assessment Lower Extremity Assessment: Defer to PT evaluation        Vision   Vision Assessment?: Yes Eye Alignment: Impaired (comment) (L eye lateral gaze to L) Ocular Range of Motion: Restricted on the left;Restricted on the right Diplopia Assessment: Disappears with one eye closed;Other (comment) (disappears with  L eye closed, pt only reports diplopia in L eye) Additional Comments: keeps L eye closed to compensate for diplopia. Administered blank glasses, occluded medial 1/3 of glasses on L side initially, pt still reporting diplipia, occluded medial 1/3 nasal portion bilaterally and pt reports drastic improvement states "this is a lot better", educated pt on tearing small strip off every day or two on each side, an working from the outside in.  Also provided pt with handout and verbalized understanding.   Perception Perception Perception: Impaired Preception Impairment Details: Spatial orientation   Praxis     Communication Communication Communication: Impaired Factors Affecting Communication: Reduced clarity of speech   Cognition Arousal: Alert Behavior During Therapy: WFL for tasks assessed/performed Cognition: No apparent impairments             OT - Cognition Comments: Pt noted reporting starting to be a little forgetful but would never forget medications                 Following commands: Intact        Cueing   Cueing Techniques: Verbal cues  Exercises      Shoulder Instructions       General Comments VSS    Pertinent Vitals/ Pain       Pain Assessment Pain Assessment: No/denies pain  Home Living                                          Prior Functioning/Environment              Frequency  Min 2X/week        Progress Toward Goals  OT Goals(current goals can now be found in the care plan section)  Progress towards OT goals: Progressing toward goals  Acute Rehab OT Goals Patient Stated Goal: none stated OT Goal Formulation: With patient Time For Goal Achievement: 12/10/23 Potential to Achieve Goals: Fair ADL Goals Pt Will Perform Tub/Shower Transfer: Shower transfer;with supervision;shower seat;ambulating Additional ADL Goal #1: Pt will voice 3 visual modificaion stratigies with the return to home Additional ADL Goal #2: Pt will be able to complete 2 visual tasks/test without fatigue to increase use of L eye  Plan      Co-evaluation                 AM-PAC OT "6 Clicks" Daily Activity     Outcome Measure   Help from another person eating meals?: None Help from another person taking care of personal grooming?: A Little Help from another person toileting, which includes using toliet, bedpan, or urinal?: A Little Help from another person  bathing (including washing, rinsing, drying)?: A Little Help from another person to put on and taking off regular upper body clothing?: A Little Help from another person to put on and taking off regular lower body clothing?: A Little 6 Click Score: 19    End of Session Equipment Utilized During Treatment: Other (comment) (SPC)  OT Visit Diagnosis: Unsteadiness on feet (R26.81);Other abnormalities of gait and mobility (R26.89);Muscle weakness (generalized) (M62.81)   Activity Tolerance Patient tolerated treatment well   Patient Left Other (comment) (in transport chair to d/c)   Nurse Communication Mobility status        Time: 2440-1027 OT Time Calculation (min): 13 min  Charges: OT General Charges $OT Visit: 1 Visit OT Treatments $Neuromuscular Re-education: 8-22 mins  Felicite Zeimet K, OTD,  OTR/L SecureChat Preferred Acute Rehab [phone]   Antionette Kirks 11/26/2023, 4:29 PM

## 2023-11-26 NOTE — Hospital Course (Signed)
 83 y.o. M with pAF on Eliquis , COPD, HLD, HTN, hx stroke who presented with 1 day diplopia.  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

## 2023-11-26 NOTE — Consult Note (Signed)
 NEUROLOGY CONSULT NOTE   Date of service: Nov 26, 2023 Patient Name: Adrian Rodgers MRN:  [id number] DOB:  1940/10/19 Chief Complaint: "Double vision" Requesting Provider: Lena Qualia, MD  History of Present Illness  Adrian Rodgers is a 83 y.o. male with a PMHx of anemia, aortic atherosclerosis, cerebral aneurysm, atrial fibrillation (on Eliquis ), emphysema of lung, gastric ulcer, hypercholesterolemia, HTN, stroke (2021) and tobacco use who presented to [facility] on Tuesday with acute onset of double vision. Exam there revealed findings most consistent with a left INO. He was transferred to [facility] for MRI brain, which revealed an acute punctate ischemic infarction on the left at the ponto-mesencephalic junction posteriorly.   Home medications include Eliquis  and atorvastatin .    ROS  Comprehensive ROS performed and pertinent positives documented in HPI    Past History   Past Medical History:  Diagnosis Date   Anemia    Aortic atherosclerosis (HCC)    Cerebral aneurysm    Community acquired pneumonia of right middle lobe of lung 09/20/2017   Emphysema of lung (HCC)    Esophagitis    Gastric ulcer    H. 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)    Stroke (HCC) 05/01/2020   Tobacco dependence     Past Surgical History:  Procedure Laterality Date   BIOPSY  04/10/2022   Procedure: BIOPSY;  Surgeon: Ace Holder, MD;  Location: [facility] ENDOSCOPY;  Service: Gastroenterology;;   ESOPHAGOGASTRODUODENOSCOPY (EGD) WITH PROPOFOL  N/A 04/10/2022   Procedure: ESOPHAGOGASTRODUODENOSCOPY (EGD) WITH PROPOFOL ;  Surgeon: Ace Holder, MD;  Location: [facility] ENDOSCOPY;  Service: Gastroenterology;  Laterality: N/A;   HEMOSTASIS CONTROL  04/10/2022   Procedure: HEMOSTASIS CONTROL;  Surgeon: Ace Holder, MD;  Location: [facility] ENDOSCOPY;  Service: Gastroenterology;;  purastat    implantable  loop recorder placement  06/05/2020   [employer] Reveal [place] model [id number] [id number] [id number]) implantable loop recorder     Family History: Family History  Family history unknown: Yes    Social History  reports that he has been smoking cigarettes. He has a 20 pack-year smoking history. He has never used smokeless tobacco. He reports that he does not currently use alcohol after a past usage of about 1.0 standard drink of alcohol per week. He reports that he does not use drugs.  No Known Allergies  Medications   Current Facility-Administered Medications:    [START ON 11/27/2023]  stroke: early stages of recovery book, , Does not apply, Once, Smith, Rondell A, MD   0.9 %  sodium chloride  infusion, , Intravenous, Continuous, Manny Sees A, MD, Last Rate: 40 mL/hr at 11/26/23 0409, New Bag at 11/26/23 0409   acetaminophen  (TYLENOL ) tablet 650 mg, 650 mg, Oral, Q4H PRN **OR** acetaminophen  (TYLENOL ) 160 MG/5ML solution 650 mg, 650 mg, Per Tube, Q4H PRN **OR** acetaminophen  (TYLENOL ) suppository 650 mg, 650 mg, Rectal, Q4H PRN, Smith, Rondell A, MD   atorvastatin  (LIPITOR ) tablet 80 mg, 80 mg, Oral, Daily, Smith, Rondell A, MD   budesonide-glycopyrrolate-formoterol  (BREZTRI) 160-9-4.8 MCG/ACT inhaler 2 puff, 2 puff, Inhalation, BID, Smith, Rondell A, MD   senna-docusate (Senokot-S) tablet 1 tablet, 1 tablet, Oral, QHS PRN, Smith, Rondell A, MD   tamsulosin  (FLOMAX ) capsule 0.4 mg, 0.4 mg, Oral, Daily, Smith, Rondell A, MD  No current facility-administered medications on file prior to encounter.   Current Outpatient Medications on File Prior to Encounter  Medication Sig  Dispense Refill   acetaminophen  (TYLENOL ) 500 MG tablet Take 1,000 mg by mouth as needed for moderate pain, mild pain, fever or headache.     albuterol  (PROVENTIL  HFA;VENTOLIN  HFA) 108 (90 Base) MCG/ACT inhaler Inhale 2 puffs into the lungs every 6 (six) hours as needed for wheezing or shortness of breath. (Patient taking differently:  Inhale 2 puffs into the lungs as needed for wheezing or shortness of breath.) 1 Inhaler 2   apixaban  (ELIQUIS ) 5 MG TABS tablet Take 1 tablet (5 mg total) by mouth 2 (two) times daily. 60 tablet 9   atorvastatin  (LIPITOR ) 80 MG tablet Take 1 tablet (80 mg total) by mouth daily.     BREZTRI AEROSPHERE 160-9-4.8 MCG/ACT AERO Inhale 2 puffs into the lungs 2 (two) times daily.     losartan  (COZAAR ) 50 MG tablet Take 50 mg by mouth daily.     Multiple Vitamins-Minerals (CENTRUM SILVER 50+MEN) TABS Take 1 tablet by mouth daily with breakfast.     pantoprazole  (PROTONIX ) 20 MG tablet Take 1 tablet (20 mg total) by mouth daily. 30 tablet 6   polyethylene glycol (MIRALAX ) 17 g packet Take 17 g by mouth daily. 14 each 0   tamsulosin  (FLOMAX ) 0.4 MG CAPS capsule Take 1 capsule (0.4 mg total) by mouth daily. 30 capsule 0     Vitals   Vitals:   11/26/23 0056 11/26/23 0203 11/26/23 0430 11/26/23 0500  BP: (!) 153/102 (!) 167/95 (!) 169/95   Pulse: 64 65 63 63  Resp: 18 18 19    Temp: 98.1 F (36.7 C) 98.1 F (36.7 C) 98.4 F (36.9 C)   TempSrc: Oral Oral Oral   SpO2: 95% 99% 97%   Weight:    83.3 kg  Height:        Body mass index is 24.91 kg/m.  Physical Exam   Physical Exam  HEENT-  [place]/AT    Lungs- Respirations unlabored Extremities- No edema  Neurological Examination Mental Status: Alert, oriented x 5, thought content appropriate.  Speech fluent without evidence of aphasia.  Able to follow all commands without difficulty. Cranial Nerves: II: Temporal visual fields intact with no extinction to DSS. PERRL  III,IV, VI: No ptosis. EOMI OD. Left eye can supraduct, infraduct and gaze to the left, but unable to gaze to the right past midline. Subtle nystagmus OD with rightward gaze.  V: Temp sensation equal bilaterally  VII: Smile symmetric VIII: Hearing intact to voice IX,X: No hoarseness XI: Symmetric shoulder shrug XII: Midline tongue extension Motor: BUE 5/5 proximally and  distally BLE 5/5 proximally and distally  No pronator drift.  Sensory: Temp and light touch intact throughout, bilaterally. No extinction to DSS.  Deep Tendon Reflexes: 1+ and symmetric throughout Cerebellar: No ataxia with FNF bilaterally  Gait: Deferred   Labs/Imaging/Neurodiagnostic studies   CBC:  Recent Labs  Lab December 02, 2023 2030  WBC 5.1  NEUTROABS 2.8  HGB 14.0  HCT 42.5  MCV 93.4  PLT 367   Basic Metabolic Panel:  Lab Results  Component Value Date   NA 145 02-Dec-2023   K 3.5 12/02/2023   CO2 26 12/02/23   GLUCOSE 84 2023/12/02   BUN 11 12/02/2023   CREATININE 0.97 2023/12/02   CALCIUM  9.2 Dec 02, 2023   GFRNONAA >60 02-Dec-2023   GFRAA >60 09/19/2017   Lipid Panel:  Lab Results  Component Value Date   LDLCALC 83 03/27/2021     ASSESSMENT  83 y.o. male with a PMHx of anemia, aortic atherosclerosis, cerebral aneurysm,  atrial fibrillation (on Eliquis ), emphysema of lung, gastric ulcer, hypercholesterolemia, HTN, stroke (2021) and tobacco use who presented to [facility] on Tuesday with acute onset of double vision. Exam there revealed findings most consistent with a left INO. He was transferred to [facility] for MRI brain, which revealed an acute punctate ischemic infarction on the left at the ponto-mesencephalic junction posteriorly. Home medications include Eliquis  and atorvastatin .  - Exam reveals a left INO.  - MRI brain w/wo contrast: Positive for a subcentimeter Acute Infarct at the left dorsal midbrain. Query left medial longitudinal fasciculus involvement. No associated hemorrhage or mass effect. 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 No abnormal enhancement identified.  - Impression: Acute ischemic infarct within the posterior ponto-mesencephalic junction on the left, with corresponding acute neurological deficit of left INO.   RECOMMENDATIONS  1. HgbA1c, fasting lipid panel 2. CTA of head and neck   3. PT consult, OT  consult, Speech consult 4. Echocardiogram 5. Continue home Eliquis  and atorvastatin  6. Permissive HTN x 24 hours  7. Risk factor modification 8. Telemetry monitoring 9. Frequent neuro checks 10. NPO until passes stroke swallow screen   Addendum (5/29 at 10:16 AM): 45 minutes was spent in the neurological evaluation and management of this patient.  ______________________________________________________________________    Hope Ly, Lutisha Knoche, MD [facility]

## 2023-11-26 NOTE — Discharge Summary (Signed)
 Physician Discharge Summary   Patient: Adrian Rodgers MRN: [id number] DOB: 08-03-1940  Admit date:     11/25/2023  Discharge date: 11/26/23  Discharge Physician: Ephriam Hashimoto   PCP: Lesley Rasher, NP     Recommendations at discharge:  Follow up with PCP for new stroke in 1 week Follow up with Neurology for stroke in 4-6 weeks     Discharge Diagnoses: Principal Problem:   Acute ischemic stroke [facility]) Active Problems:   Essential hypertension   Paroxysmal atrial fibrillation (HCC)   Dyslipidemia   COPD (chronic obstructive pulmonary disease) (HCC)   BPH (benign prostatic hyperplasia)   Tobacco abuse      Hospital Course: 83 y.o. M with pAF on Eliquis , COPD, HLD, HTN, hx stroke who presented with 1 day diplopia.  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     Acute ischemic stroke, left dorsal median pons MRI brain showed small pontine stroke, correlates well to INO.  Dilated by neurology, suspected small vessel disease due to smoking, hypercholesterolemia. - CTA head and neck unremarkable, echocardiogram with PFO, likely unrelated LDL elevated at 85 Hemoglobin A1c normal PT and OT recommended outpatient follow-up Smoking cessation recommended, patient not interested in quitting He should start new Zetia , continue aspirin  and Eliquis  daily Outpatient follow-up with neurology was arranged   Paroxysmal atrial fibrillation Relatively new diagnosis, A-fib found on loop recorder in January 2025 and started on Eliquis .  Needs medication reconciliation with PCP.            The [place]  Controlled Substances Registry was reviewed for this patient prior to discharge.   Consultants: Neurology Procedures performed: MRI brain CTA head and neck Echo  Disposition: Home Diet recommendation:  Discharge Diet Orders (From admission, onward)     Start     Ordered   11/26/23 0000  Diet - low sodium heart healthy        11/26/23 1410              DISCHARGE MEDICATION: Allergies as of 11/26/2023   No Known Allergies      Medication List     TAKE these medications    acetaminophen  500 MG tablet Commonly known as: TYLENOL  Take 1,000 mg by mouth as needed for moderate pain, mild pain, fever or headache.   albuterol  108 (90 Base) MCG/ACT inhaler Commonly known as: VENTOLIN  HFA Inhale 2 puffs into the lungs every 6 (six) hours as needed for wheezing or shortness of breath.   apixaban  5 MG Tabs tablet Commonly known as: Eliquis  Take 1 tablet (5 mg total) by mouth 2 (two) times daily.   aspirin  EC 81 MG tablet Take 1 tablet (81 mg total) by mouth daily. Swallow whole.   atorvastatin  80 MG tablet Commonly known as: LIPITOR  Take 1 tablet (80 mg total) by mouth daily. What changed: when to take this   Breztri  Aerosphere 160-9-4.8 MCG/ACT Aero inhaler Generic drug: budesonide -glycopyrrolate -formoterol  Inhale 2 puffs into the lungs 2 (two) times daily.   buPROPion  150 MG 24 hr tablet Commonly known as: WELLBUTRIN  XL Take 150 mg by mouth every morning.   Centrum Silver 50+Men Tabs Take 1 tablet by mouth daily with breakfast.   ezetimibe  10 MG tablet Commonly known as: ZETIA  Take 1 tablet (10 mg total) by mouth daily.   losartan  50 MG tablet Commonly known as: COZAAR  Take 50 mg by mouth daily.   pantoprazole  40 MG tablet Commonly known as: PROTONIX  Take 40 mg by mouth daily.  polyethylene glycol 17 g packet Commonly known as: MiraLax  Take 17 g by mouth daily. What changed:  when to take this reasons to take this   tamsulosin  0.4 MG Caps capsule Commonly known as: FLOMAX  Take 1 capsule (0.4 mg total) by mouth daily.        Follow-up Information     Consuelo Denmark, MD. Schedule an appointment as soon as possible for a visit in 2 month(s).   Specialty: Neurology Contact information: [address] [place] [address] [place] [address] [phone]         [facility]  [facility] Follow up.   Specialty: Rehabilitation Why: Outpatient PT/OT-office to call with visit times within 3-5 business days. Contact information: [address] [facility] [place]  [address] [phone]        Lesley Rasher, NP. Schedule an appointment as soon as possible for a visit in 1 week(s).   Contact information: [address] [place] [address] [phone]                 Discharge Instructions     Ambulatory referral to Neurology   Complete by: As directed    Follow up with stroke clinic NP Jessica at [facility] in about 4-6 weeks. Thanks.   Ambulatory referral to Occupational Therapy   Complete by: As directed    Outpatient OT evaluation and treatment for neuro- CVA. Double vision left eye.   Ambulatory referral to Physical Therapy   Complete by: As directed    Outpatient Physical Therapy Neuro for CVA evaluation and treatment. Double vision Left eye.   Diet - low sodium heart healthy   Complete by: As directed    Discharge instructions   Complete by: As directed    **IMPORTANT DISCHARGE INSTRUCTIONS**   From Dr. Darlyn Eke: You were admitted for a stroke  The stroke affected the part of the brain that controls horizontal eye movements  This leads to double vision  Wear the glasses provided to you by Occupational Therapy and follow up with OT and PT in the office   The stroke was caused by small vessel damage from smoking, high cholesterol, high blood pressure and so you should stop smoking and control these better  To stop smoking, continue the Wellbutrin  and discuss with your PCP I recommend you use nicotine  gum and/or patches  To control cholesterol better, add the medicine ezetimibe to your atorvastatin /Lipitor   ADD aspirin  81 mg once daily to your blood thinner Eliquis /apixaban   Lastly, reduce red meat and salt in your diet.  Go see your primary care doctor in 1 week Bring all your medicines and review them  with your PCP  Go see the neurologists in 6 weeks   Increase activity slowly   Complete by: As directed        Discharge Exam: Filed Weights   11/25/23 1941 11/26/23 0500  Weight: 85.2 kg 83.3 kg    General: Pt is alert, awake, not in acute distress Cardiovascular: RRR, nl S1-S2, no murmurs appreciated.   No LE edema.   Respiratory: Normal respiratory rate and rhythm.  CTAB without rales or wheezes. Abdominal: Abdomen soft and non-tender.  No distension or HSM.   Neuro/Psych: Strength symmetric in upper and lower extremities.  Judgment and insight appear normal.  Left eye INO noted.   Condition at discharge: fair  The results of significant diagnostics from this hospitalization (including imaging, microbiology, ancillary and laboratory) are listed below  for reference.   Imaging Studies: ECHOCARDIOGRAM COMPLETE Result Date: 11/26/2023    ECHOCARDIOGRAM REPORT   Patient Name:   Adrian Rodgers Date of Exam: 11/26/2023 Medical Rec #:  [id number]       Height:       72.0 in Accession #:    [id number]      Weight:       183.7 lb Date of Birth:  [birth year]       BSA:          2.055 m Patient Age:    83 years        BP:           144/106 mmHg Patient Gender: M               HR:           61 bpm. Exam Location:  Inpatient Procedure: 2D Echo, Color Doppler, Cardiac Doppler and Saline Contrast Bubble            Study (Both Spectral and Color Flow Doppler were utilized during            procedure). Indications:    Stroke i63.9  History:        Patient has prior history of Echocardiogram examinations, most                 recent 04/09/2022. COPD, Arrythmias:Atrial Fibrillation; Risk                 Factors:Hypertension and Dyslipidemia.  Sonographer:    Jeralene Mom Referring Phys: [id number] RONDELL A SMITH IMPRESSIONS  1. Left ventricular ejection fraction, by estimation, is 60 to 65%. The left ventricle has normal function. The left ventricle has no regional wall motion abnormalities. There is  mild concentric left ventricular hypertrophy. Left ventricular diastolic parameters are consistent with Grade I diastolic dysfunction (impaired relaxation).  2. Right ventricular systolic function is normal. The right ventricular size is normal. There is normal pulmonary artery systolic pressure.  3. Left atrial size was mildly dilated.  4. The mitral valve is normal in structure. Trivial mitral valve regurgitation. No evidence of mitral stenosis.  5. The aortic valve is tricuspid. Aortic valve regurgitation is mild. No aortic stenosis is present.  6. Aortic dilatation noted. There is mild dilatation of the aortic root, measuring 41 mm.  7. The inferior vena cava is normal in size with greater than 50% respiratory variability, suggesting right atrial pressure of 3 mmHg.  8. Agitated saline contrast bubble study was positive with shunting observed within 3-6 cardiac cycles suggestive of interatrial shunt. FINDINGS  Left Ventricle: Left ventricular ejection fraction, by estimation, is 60 to 65%. The left ventricle has normal function. The left ventricle has no regional wall motion abnormalities. The left ventricular internal cavity size was normal in size. There is  mild concentric left ventricular hypertrophy. Left ventricular diastolic parameters are consistent with Grade I diastolic dysfunction (impaired relaxation). Indeterminate filling pressures. Right Ventricle: The right ventricular size is normal. No increase in right ventricular wall thickness. Right ventricular systolic function is normal. There is normal pulmonary artery systolic pressure. The tricuspid regurgitant velocity is 2.39 m/s, and  with an assumed right atrial pressure of 3 mmHg, the estimated right ventricular systolic pressure is 25.8 mmHg. Left Atrium: Left atrial size was mildly dilated. Right Atrium: Right atrial size was normal in size. Pericardium: There is no evidence of pericardial effusion. Mitral Valve: The mitral valve is normal in  structure. Trivial mitral  valve regurgitation. No evidence of mitral valve stenosis. Tricuspid Valve: The tricuspid valve is normal in structure. Tricuspid valve regurgitation is not demonstrated. No evidence of tricuspid stenosis. Aortic Valve: The aortic valve is tricuspid. Aortic valve regurgitation is mild. Aortic regurgitation PHT measures 339 msec. No aortic stenosis is present. Aortic valve mean gradient measures 2.0 mmHg. Aortic valve peak gradient measures 4.0 mmHg. Aortic  valve area, by VTI measures 2.68 cm. Pulmonic Valve: The pulmonic valve was normal in structure. Pulmonic valve regurgitation is mild. No evidence of pulmonic stenosis. Aorta: Aortic dilatation noted. There is mild dilatation of the aortic root, measuring 41 mm. Venous: The inferior vena cava is normal in size with greater than 50% respiratory variability, suggesting right atrial pressure of 3 mmHg. IAS/Shunts: No atrial level shunt detected by color flow Doppler. Agitated saline contrast was given intravenously to evaluate for intracardiac shunting. Agitated saline contrast bubble study was positive with shunting observed within 3-6 cardiac cycles suggestive of interatrial shunt.  LEFT VENTRICLE PLAX 2D LVIDd:         3.70 cm   Diastology LVIDs:         2.60 cm   LV e' medial:    3.92 cm/s LV PW:         1.10 cm   LV E/e' medial:  12.8 LV IVS:        1.20 cm   LV e' lateral:   7.29 cm/s LVOT diam:     2.30 cm   LV E/e' lateral: 6.9 LV SV:         64 LV SV Index:   31 LVOT Area:     4.15 cm  RIGHT VENTRICLE RV Basal diam:  4.40 cm RV Mid diam:    3.70 cm RV S prime:     14.00 cm/s TAPSE (M-mode): 2.6 cm LEFT ATRIUM             Index        RIGHT ATRIUM           Index LA diam:        2.50 cm 1.22 cm/m   RA Area:     20.50 cm LA Vol (A2C):   25.9 ml 12.60 ml/m  RA Volume:   66.30 ml  32.26 ml/m LA Vol (A4C):   21.6 ml 10.51 ml/m LA Biplane Vol: 23.6 ml 11.48 ml/m  AORTIC VALVE                    PULMONIC VALVE AV Area (Vmax):     2.73 cm     PR End Diast Vel: 6.25 msec AV Area (Vmean):   2.58 cm AV Area (VTI):     2.68 cm AV Vmax:           99.40 cm/s AV Vmean:          74.000 cm/s AV VTI:            0.240 m AV Peak Grad:      4.0 mmHg AV Mean Grad:      2.0 mmHg LVOT Vmax:         65.20 cm/s LVOT Vmean:        45.900 cm/s LVOT VTI:          0.155 m LVOT/AV VTI ratio: 0.65 AI PHT:            339 msec AR Vena Contracta: 0.40 cm  AORTA Ao Root diam: 4.10 cm Ao Asc  diam:  4.20 cm MITRAL VALVE                TRICUSPID VALVE MV Area (PHT): 2.65 cm     TR Peak grad:   22.8 mmHg MV Decel Time: 286 msec     TR Vmax:        239.00 cm/s MV E velocity: 50.10 cm/s MV A velocity: 101.00 cm/s  SHUNTS MV E/A ratio:  0.50         Systemic VTI:  0.16 m                             Systemic Diam: 2.30 cm Maudine Sos MD Electronically signed by Maudine Sos MD Signature Date/Time: 11/26/2023/2:52:03 PM    Final    MR BRAIN W WO CONTRAST Result Date: 11/26/2023 CLINICAL DATA:  83 year old male with TIA, neurologic deficit: Double vision and left eye deviation. EXAM: MRI HEAD WITHOUT AND WITH CONTRAST TECHNIQUE: Multiplanar, multiecho pulse sequences of the brain and surrounding structures were obtained without and with intravenous contrast. CONTRAST:  8mL GADAVIST  GADOBUTROL  1 MMOL/ML IV SOLN COMPARISON:  CT head, CTA head and neck yesterday. Brain MRI 03/27/2021. FINDINGS: Brain: Subcentimeter area of restricted diffusion along the dorsal left midbrain near the just caudal to the left cerebral aqueduct (series 5, image 68 and series 6, image 18). Elsewhere midbrain and brainstem diffusion seems to remain normal. The finding is also visible on coronal DWI series 7, image 55. No other restricted diffusion identified. No midline shift, mass effect, evidence of mass lesion, ventriculomegaly, extra-axial collection or acute intracranial hemorrhage. Cervicomedullary junction and pituitary are within normal limits. Small chronic infarcts in the  bilateral cerebellum, left basal ganglia and left thalamus. Patchy chronic T2 heterogeneity in the pons. Patchy and confluent bilateral cerebral white matter T2 and FLAIR hyperintensity. No convincing chronic cerebral blood products. Small area of chronic cortical encephalomalacia in the right superior frontal gyrus, perirolandic. No abnormal enhancement identified. No dural thickening. Vascular: Major intracranial vascular flow voids are stable since 2022. Some generalized intracranial artery tortuosity. Following contrast the major dural venous sinuses are enhancing and appear to be patent. Skull and upper cervical spine: Negative for age visible cervical spine. Visualized bone marrow signal is within normal limits. Sinuses/Orbits: Normal suprasellar cistern. Cavernous sinus appears symmetric and normal. Orbits and paranasal sinuses appears stable and negative. Other: Visible internal auditory structures appear normal. Mastoids are clear. IMPRESSION: 1. Positive for a subcentimeter Acute Infarct at the left dorsal midbrain. Query left medial longitudinal fasciculus involvement. No associated hemorrhage or mass effect. 2. 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 Electronically Signed   By: Marlise Simpers M.D.   On: 11/26/2023 04:35   CT ANGIO HEAD NECK W WO CM Result Date: 11/25/2023 CLINICAL DATA:  Stroke/TIA.  Epiploic via. EXAM: CT ANGIOGRAPHY HEAD AND NECK WITH AND WITHOUT CONTRAST TECHNIQUE: Multidetector CT imaging of the head and neck was performed using the standard protocol during bolus administration of intravenous contrast. Multiplanar CT image reconstructions and MIPs were obtained to evaluate the vascular anatomy. Carotid stenosis measurements (when applicable) are obtained utilizing NASCET criteria, using the distal internal carotid diameter as the denominator. RADIATION DOSE REDUCTION: This exam was performed according to the departmental  dose-optimization program which includes automated exposure control, adjustment of the mA and/or kV according to patient size and/or use of iterative reconstruction technique. CONTRAST:  75mL OMNIPAQUE  IOHEXOL   350 MG/ML SOLN COMPARISON:  04/08/2022, 03/25/2021 FINDINGS: CT HEAD FINDINGS Brain: There is no mass, hemorrhage or extra-axial collection. There is generalized atrophy without lobar predilection. Hypodensity of the white matter is most commonly associated with chronic microvascular disease. Bilateral basal ganglia mineralization. Vascular: No hyperdense vessel or unexpected vascular calcification. Skull: The visualized skull base, calvarium and extracranial soft tissues are normal. Sinuses/Orbits: No fluid levels or advanced mucosal thickening of the visualized paranasal sinuses. No mastoid or middle ear effusion. Normal orbits. CTA NECK FINDINGS Skeleton: No acute abnormality or high grade bony spinal canal stenosis. Other neck: Normal pharynx, larynx and major salivary glands. No cervical lymphadenopathy. Unremarkable thyroid gland. Upper chest: Biapical emphysema Aortic arch: There is calcific atherosclerosis of the aortic arch. Conventional 3 vessel aortic branching pattern. RIGHT carotid system: Normal without aneurysm, dissection or stenosis. LEFT carotid system: Normal without aneurysm, dissection or stenosis. Vertebral arteries: Codominant configuration. There is no dissection, occlusion or flow-limiting stenosis to the skull base (V1-V3 segments). CTA HEAD FINDINGS POSTERIOR CIRCULATION: Vertebral arteries are normal. No proximal occlusion of the anterior or inferior cerebellar arteries. Basilar artery is normal. Superior cerebellar arteries are normal. Posterior cerebral arteries are normal. ANTERIOR CIRCULATION: Intracranial internal carotid arteries are normal. Anterior cerebral arteries are normal. Middle cerebral arteries are normal. Venous sinuses: As permitted by contrast timing, patent.  Anatomic variants: None Review of the MIP images confirms the above findings. IMPRESSION: No emergent large vessel occlusion or high-grade stenosis of the intracranial arteries. Aortic Atherosclerosis (ICD10-I70.0) and Emphysema (ICD10-J43.9). Electronically Signed   By: Juanetta Nordmann M.D.   On: 11/25/2023 22:16   CUP PACEART REMOTE DEVICE CHECK Result Date: 11/20/2023 ILR summary report received. Battery status OK. Normal device function. No new symptom, tachy, brady, or pause episodes. No new AF episodes. Monthly summary reports and ROV/PRN LA, CVRS   Microbiology: Results for orders placed or performed during the hospital encounter of 12/19/22  Resp panel by RT-PCR (RSV, Flu A&B, Covid) Anterior Nasal Swab     Status: None   Collection Time: 12/19/22  7:45 PM   Specimen: Anterior Nasal Swab  Result Value Ref Range Status   SARS Coronavirus 2 by RT PCR NEGATIVE NEGATIVE Final    Comment: (NOTE) SARS-CoV-2 target nucleic acids are NOT DETECTED.  The SARS-CoV-2 RNA is generally detectable in upper respiratory specimens during the acute phase of infection. The lowest concentration of SARS-CoV-2 viral copies this assay can detect is 138 copies/mL. A negative result does not preclude SARS-Cov-2 infection and should not be used as the sole basis for treatment or other patient management decisions. A negative result may occur with  improper specimen collection/handling, submission of specimen other than nasopharyngeal swab, presence of viral mutation(s) within the areas targeted by this assay, and inadequate number of viral copies(<138 copies/mL). A negative result must be combined with clinical observations, patient history, and epidemiological information. The expected result is Negative.  Fact Sheet for Patients:  BloggerCourse.com  Fact Sheet for Healthcare Providers:  SeriousBroker.it  This test is no t yet approved or cleared by the  [place]  FDA and  has been authorized for detection and/or diagnosis of SARS-CoV-2 by FDA under an Emergency Use Authorization (EUA). This EUA will remain  in effect (meaning this test can be used) for the duration of the COVID-19 declaration under Section 564(b)(1) of the Act, 21 U.S.C.section 360bbb-3(b)(1), unless the authorization is terminated  or revoked sooner.       Influenza A by PCR NEGATIVE NEGATIVE Final  Influenza B by PCR NEGATIVE NEGATIVE Final    Comment: (NOTE) The Xpert Xpress SARS-CoV-2/FLU/RSV plus assay is intended as an aid in the diagnosis of influenza from Nasopharyngeal swab specimens and should not be used as a sole basis for treatment. Nasal washings and aspirates are unacceptable for Xpert Xpress SARS-CoV-2/FLU/RSV testing.  Fact Sheet for Patients: BloggerCourse.com  Fact Sheet for Healthcare Providers: SeriousBroker.it  This test is not yet approved or cleared by the [place]  FDA and has been authorized for detection and/or diagnosis of SARS-CoV-2 by FDA under an Emergency Use Authorization (EUA). This EUA will remain in effect (meaning this test can be used) for the duration of the COVID-19 declaration under Section 564(b)(1) of the Act, 21 U.S.C. section 360bbb-3(b)(1), unless the authorization is terminated or revoked.     Resp Syncytial Virus by PCR NEGATIVE NEGATIVE Final    Comment: (NOTE) Fact Sheet for Patients: BloggerCourse.com  Fact Sheet for Healthcare Providers: SeriousBroker.it  This test is not yet approved or cleared by the [place]  FDA and has been authorized for detection and/or diagnosis of SARS-CoV-2 by FDA under an Emergency Use Authorization (EUA). This EUA will remain in effect (meaning this test can be used) for the duration of the COVID-19 declaration under Section 564(b)(1) of the Act, 21 U.S.C. section  360bbb-3(b)(1), unless the authorization is terminated or revoked.  Performed at [facility], [address] [place], [place] [address]   Blood culture (routine x 2)     Status: None   Collection Time: 12/19/22  8:40 PM   Specimen: BLOOD  Result Value Ref Range Status   Specimen Description   Final    BLOOD LEFT ANTECUBITAL Performed at [facility], [address] [place], [place] [address]    Special Requests   Final    BOTTLES DRAWN AEROBIC AND ANAEROBIC Blood Culture adequate volume Performed at [facility], [address] [place], [place] [address]    Culture   Final    NO GROWTH 5 DAYS Performed at [facility], [address], [place], [place] [address]    Report Status 12/24/2022 FINAL  Final  Blood culture (routine x 2)     Status: None   Collection Time: 12/19/22  8:45 PM   Specimen: BLOOD  Result Value Ref Range Status   Specimen Description   Final    BLOOD RIGHT ANTECUBITAL Performed at [facility], [address] [place], [place] [address]    Special Requests   Final    BOTTLES DRAWN AEROBIC AND ANAEROBIC Blood Culture adequate volume Performed at [facility], [address] [place], [place] [address]    Culture   Final    NO GROWTH 5 DAYS Performed at [facility], [address] [place], [place] [address]    Report Status 12/24/2022 FINAL  Final  Expectorated Sputum Assessment w Gram Stain, Rflx to Resp Cult     Status: None   Collection Time: 12/19/22  9:35 PM   Specimen: Sputum  Result Value Ref Range Status   Specimen Description   Final    SPUTUM Performed at [facility], [address] [place], [place] [address]    Special Requests   Final    NONE Performed at [facility], [address] [place], [place] [address]    Sputum evaluation   Final  THIS SPECIMEN IS ACCEPTABLE FOR SPUTUM CULTURE Performed at [facility], [address] [place], [place] [address]    Report Status 12/20/2022 FINAL  Final  Culture, Respiratory w Gram Stain     Status: None   Collection Time: 12/19/22  9:35 PM   Specimen: SPU  Result Value Ref Range Status   Specimen Description SPUTUM  Final   Special Requests NONE Reflexed from [id number]  Final   Gram Stain   Final    FEW SQUAMOUS EPITHELIAL CELLS PRESENT MODERATE WBC PRESENT,BOTH PMN AND MONONUCLEAR FEW GRAM POSITIVE COCCI IN CHAINS FEW GRAM NEGATIVE RODS    Culture   Final    FEW HAEMOPHILUS INFLUENZAE BETA LACTAMASE NEGATIVE Performed at [facility], [address] [place], [place] [address]    Report Status 12/22/2022 FINAL  Final  Respiratory (~20 pathogens) panel by PCR     Status: None   Collection Time: 12/20/22  6:22 PM   Specimen: Nasopharyngeal Swab; Respiratory  Result Value Ref Range Status   Adenovirus NOT DETECTED NOT DETECTED Final   Coronavirus 229E NOT DETECTED NOT DETECTED Final    Comment: (NOTE) The Coronavirus on the Respiratory Panel, DOES NOT test for the novel  Coronavirus (2019 nCoV)    Coronavirus HKU1 NOT DETECTED NOT DETECTED Final   Coronavirus NL63 NOT DETECTED NOT DETECTED Final   Coronavirus OC43 NOT DETECTED NOT DETECTED Final   Metapneumovirus NOT DETECTED NOT DETECTED Final   Rhinovirus / Enterovirus NOT DETECTED NOT DETECTED Final   Influenza A NOT DETECTED NOT DETECTED Final   Influenza B NOT DETECTED NOT DETECTED Final   Parainfluenza Virus 1 NOT DETECTED NOT DETECTED Final   Parainfluenza Virus 2 NOT DETECTED NOT DETECTED Final   Parainfluenza Virus 3 NOT DETECTED NOT DETECTED Final   Parainfluenza Virus 4 NOT DETECTED NOT DETECTED Final   Respiratory Syncytial Virus NOT DETECTED NOT DETECTED Final   Bordetella pertussis NOT DETECTED NOT DETECTED Final   Bordetella Parapertussis NOT DETECTED NOT DETECTED Final   Chlamydophila pneumoniae NOT DETECTED NOT DETECTED Final   Mycoplasma pneumoniae NOT DETECTED NOT DETECTED Final     Comment: Performed at [facility], [address] [place], [place] [address]    Labs: CBC: Recent Labs  Lab 11/25/23 2030  WBC 5.1  NEUTROABS 2.8  HGB 14.0  HCT 42.5  MCV 93.4  PLT 367   Basic Metabolic Panel: Recent Labs  Lab 11/25/23 2030  NA 145  K 3.5  CL 108  CO2 26  GLUCOSE 84  BUN 11  CREATININE 0.97  CALCIUM  9.2   Liver Function Tests: No results for input(s): "AST", "ALT", "ALKPHOS", "BILITOT", "PROT", "ALBUMIN" in the last 168 hours. CBG: No results for input(s): "GLUCAP" in the last 168 hours.  Discharge time spent: approximately 45 minutes spent on discharge counseling, evaluation of patient on day of discharge, and coordination of discharge planning with nursing, social work, pharmacy and case management  Signed: Ephriam Hashimoto, MD [facility] 11/26/2023

## 2023-11-26 NOTE — Discharge Instructions (Signed)
 Dear Kermitt Pedlar,   Congratulations for your interest in quitting smoking!  Find a program that suits you best: when you want to quit, how you need support, where you live, and how you like to learn.    If you're ready to get started TODAY, consider scheduling a visit through [email or web address].  Appointments are available from 8am to 8pm, Monday to Friday.   Most health insurance plans will cover some level of tobacco cessation visits and medications.    Additional Resources: [employer] are also available to help you quit & provide the support you'll need. Many programs are available in both [place] and Spanish and have a long history of successfully helping people get off and stay off tobacco.    Quit Smoking Apps:  quitSTART at [employer]iousBroker.de QuitGuide?at ForgetParking.dk Online education and resources: Smokefree  at [employer].gov Free Telephone Coaching: QuitNow,  Call 1-800-QUIT-NOW (380-321-3197) or Text- Ready to 515 883 0267 *Quitline [place] has teamed up with Medicaid to offer a free 14 week program    Vaping- Want to Quit? Free 24/7 support. Call [facility]  [place], [place], [place], [place], [place]  [facility]

## 2023-11-26 NOTE — TOC CM/SW Note (Signed)
 Transition of Care [facility]) - Inpatient Brief Assessment   Patient Details  Name: Jahzion Brogden MRN: [id number] Date of Birth: 09-24-1940  [facility]) CM/SW Contact:    Cosimo Diones, RN Phone Number: 11/26/2023, 3:37 PM   Clinical Narrative: Patient presented for double vision. PTA patient was from home alone and states his daughter visits often. Patient wanted home health PT/OT; however, unable to find an agency that will accept the patient for neuro needs. Case Manager spoke with daughter and she is agreeable to outpatient PT/OT @ [facility] and she can assist with transportation. Ambulatory referral submitted and the office will call for visit times.     Transition of Care Asessment: Insurance and Status: Insurance coverage has been reviewed Patient has primary care physician: Yes Home environment has been reviewed: reviewed Prior level of function:: independent Prior/Current Home Services: No current home services Social Drivers of Health Review: SDOH reviewed no interventions necessary Readmission risk has been reviewed: Yes Transition of care needs: no transition of care needs at this time

## 2023-11-26 NOTE — Progress Notes (Signed)
 Discharge instruction reviewed with patient's daughter. Verbalized understanding and all questions were answered.

## 2023-11-26 NOTE — H&P (Signed)
 History and Physical    Patient: Adrian Rodgers CBJ:[id number] DOB: 13-Nov-1940 DOA: 11/25/2023 DOS: the patient was seen and examined on 11/26/2023 PCP: Velna Ghee, PA-C  Patient coming from: Transfer from [facility]  Chief Complaint:  Chief Complaint  Patient presents with   Eye Problem   HPI: Adrian Rodgers is a 83 y.o. 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  He has experienced double vision since waking up yesterday morning in his left eye.  He thinks his vision was normal before going to sleep night prior. He does not wear glasses and has no prior history of visual disturbances.  Denies any focal weakness and he is on Eliquis  which he reports taking as advised.  He normally manages his own medications without any issue.  Denies any recent fevers, chills, palpitations, chest pain, shortness of breath, nausea, vomiting, or diarrhea.  He has a loop recorder in place.  Records note recently interrogated 11/19/2023 and did not show any tachycardia, bradycardia, or pauses episodes.  Also did not show any new atrial fibrillation episodes.  In the ED patient was noted to be afebrile with stable vital signs.  CT angiogram of the head and neck did not reveal any large vessel occlusion or high-grade stenosis.  Labs including CBC and BMP were unremarkable.  Case was discussed with Dr. Lindzen who recommended admission for MRI with and without contrast.   Past Medical History:  Diagnosis Date   Anemia    Aortic atherosclerosis (HCC)    Cerebral aneurysm    Community acquired pneumonia of right middle lobe of lung 09/20/2017   Emphysema of lung (HCC)    Esophagitis    Gastric ulcer    H. 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)    Stroke (HCC) 05/01/2020   Tobacco dependence     Past Surgical History:  Procedure Laterality Date   BIOPSY  04/10/2022   Procedure: BIOPSY;  Surgeon: Ace Holder, MD;  Location: [facility] ENDOSCOPY;  Service: Gastroenterology;;   ESOPHAGOGASTRODUODENOSCOPY (EGD) WITH PROPOFOL  N/A 04/10/2022   Procedure: ESOPHAGOGASTRODUODENOSCOPY (EGD) WITH PROPOFOL ;  Surgeon: Ace Holder, MD;  Location: [facility] ENDOSCOPY;  Service: Gastroenterology;  Laterality: N/A;   HEMOSTASIS CONTROL  04/10/2022   Procedure: HEMOSTASIS CONTROL;  Surgeon: Ace Holder, MD;  Location: [facility] ENDOSCOPY;  Service: Gastroenterology;;  purastat    implantable loop recorder placement  06/05/2020   Medtronic Reveal [place] model [phone]) implantable loop recorder    Social History:  reports that he has been smoking cigarettes. He has a 20 pack-year smoking history. He has never used smokeless tobacco. He reports that he does not currently use alcohol after a past usage of about 1.0 standard drink of alcohol per week. He reports that he does not use drugs.  No Known Allergies  Family History  Family history unknown: Yes    Prior to Admission medications   Medication Sig Start Date End Date Taking? Authorizing Provider  acetaminophen  (TYLENOL ) 500 MG tablet Take 1,000 mg by mouth as needed for moderate pain, mild pain, fever or headache.    [provider]  albuterol  (PROVENTIL  HFA;VENTOLIN  HFA) 108 (90 Base) MCG/ACT inhaler Inhale 2 puffs into the lungs every 6 (six) hours as needed  for wheezing or shortness of breath. Patient taking differently: Inhale 2 puffs into the lungs as needed for wheezing or shortness of breath. 09/21/17   Purohit, Michial Akin, MD  apixaban  (ELIQUIS ) 5 MG TABS tablet Take 1 tablet (5 mg total) by mouth 2 (two) times daily. 11/17/23   Nathanel Bal, PA-C  atorvastatin  (LIPITOR ) 80 MG tablet Take 1 tablet (80 mg total) by  mouth daily. 04/03/22   Krishnan, Gokul, MD  BREZTRI  AEROSPHERE 160-9-4.8 MCG/ACT AERO Inhale 2 puffs into the lungs 2 (two) times daily.    [provider]  losartan  (COZAAR ) 50 MG tablet Take 50 mg by mouth daily. 12/06/22 12/06/23  [provider]  Multiple Vitamins-Minerals (CENTRUM SILVER 50+MEN) TABS Take 1 tablet by mouth daily with breakfast.    [provider]  pantoprazole  (PROTONIX ) 20 MG tablet Take 1 tablet (20 mg total) by mouth daily. 10/29/22   Armbruster, Lendon Queen, MD  polyethylene glycol (MIRALAX ) 17 g packet Take 17 g by mouth daily. 12/14/22   Rancour, Mara Seminole, MD  tamsulosin  (FLOMAX ) 0.4 MG CAPS capsule Take 1 capsule (0.4 mg total) by mouth daily. 04/14/22   Lavaughn Portland, MD    Physical Exam: Vitals:   11/25/23 2030 11/25/23 2145 11/26/23 0056 11/26/23 0203  BP: (!) 166/93 (!) 159/100 (!) 153/102 (!) 167/95  Pulse: 69 62 64 65  Resp: (!) 21 19 18 18   Temp:   98.1 F (36.7 C) 98.1 F (36.7 C)  TempSrc:   Oral Oral  SpO2: 93% 94% 95% 99%  Weight:      Height:        Constitutional: Elderly male currently in no acute distress Eyes: Pupils are equal and reactive to light.  Left eye deviated to the left unable to cross midline medially. ENMT: Mucous membranes are moist. Posterior pharynx clear of any exudate or lesions.Normal dentition.  Neck: normal, supple  Respiratory: clear to auscultation bilaterally, no wheezing, no crackles. Normal respiratory effort. No accessory muscle use.  Cardiovascular: Regular rate and rhythm, no murmurs / rubs / gallops. No extremity edema. 2+ pedal pulses.   Abdomen: no tenderness, no masses palpated.   Bowel sounds positive.  Musculoskeletal: no clubbing / cyanosis. No joint deformity upper and lower extremities. Good ROM, no contractures. Normal muscle tone.  Skin: no rashes, lesions, ulcers. No induration Neurologic:  Sensation intact, DTR normal. Strength 5/5 in all 4.  Psychiatric: Normal judgment and insight.  Alert and oriented x 3. Normal mood.   Data Reviewed:  EKG reveals a sinus/atopic rhythm 68 bpm with first-degree heart block and right axis deviation.  Reviewed labs, imaging, and pertinent records as documented.  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  Noted to be unable to move his left past midline medially.  Initial CTA of the head and neck did not note any large vessel occlusion.  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  - Admit to a telemetry bed - Stroke order set utilized - Check hemoglobin A1c and lipid panel - Check echocardiogram - PT/OT to evaluate and treat - Transitions of care consulted - Follow-up telemetry - Neurology consulted, will follow-up for any further recommendations  Essential hypertension On admission blood pressures noted to be 143/101-169/95.  Home blood pressure medications include losartan  50 mg daily. - Allowing for permissive hypertension  Paroxysmal atrial fibrillation  on chronic anticoagulation Patient appears to be in a sinus rhythm. Reports taking his medications as prescribed. - Continue Eliquis   History of CVA Patient with 2 previous CVAs, but no significant deficits due to them. - Continue statin  Dyslipidemia - Follow-up lipid panel - Continue atorvastatin   COPD Patient without significant wheezing noted on physical exam at this time. - Continue home inhaler - Nebulized breathing treatments as needed  BPH - Continue tamsulosin   Tobacco abuse Patient continues to smoke approximately 9 cigarettes or so per day on average. - Continue to counsel need of cessation of tobacco use  DVT prophylaxis: Eliquis   Advance Care Planning:   Code Status: Full Code    Consults: Neurology  Family Communication:   Severity of Illness: The appropriate patient status for  this patient is OBSERVATION. Observation status is judged to be reasonable and necessary in order to provide the required intensity of service to ensure the patient's safety. The patient's presenting symptoms, physical exam findings, and initial radiographic and laboratory data in the context of their medical condition is felt to place them at decreased risk for further clinical deterioration. Furthermore, it is anticipated that the patient will be medically stable for discharge from the hospital within 2 midnights of admission.   Author: Lena Qualia, MD 11/26/2023 3:02 AM  For on call review [email or web address].

## 2023-11-26 NOTE — Progress Notes (Signed)
 Echocardiogram 2D Echocardiogram has been performed.  Emmaline Haring Rache Klimaszewski RDCS 11/26/2023, 12:08 PM

## 2023-11-26 NOTE — Evaluation (Signed)
 Occupational Therapy Evaluation Patient Details Name: Adrian Rodgers MRN: [id number] DOB: 08-24-1940 Today's Date: 11/26/2023   History of Present Illness   Pt is a 83 year old male who presents 11/25/23 from [facility] due to experience double vision in his L eye. CT negative. 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 PMH significant for  hyperlipidemia, gastric ulcer, H. pylori, hypertension, CVA, PNA and tobacco abuse.     Clinical Impressions Pt reported at PLOF they use a SPC for all mobility, mod I for all ADLS and his daughter takes him out for groceries/md visits and sets up medications. Pt at this time noted his vision of his L eye impacting his function for ADLS /mobility. He required CGA and cues to midline in position to walker, set up for UE and CGA for LE ADLS. Pt reported with the return to home his daughter can be there most the day and can keep taking him out of the home for follow up visits, groceries and set up of medications. At this time recommendation for OP Neuro rehab with the return to home and acute OT will continue to follow.     If plan is discharge home, recommend the following:   A little help with walking and/or transfers;A little help with bathing/dressing/bathroom;Assist for transportation;Help with stairs or ramp for entrance;Direct supervision/assist for medications management;Direct supervision/assist for financial management;Assistance with cooking/housework     Functional Status Assessment   Patient has had a recent decline in their functional status and demonstrates the ability to make significant improvements in function in a reasonable and predictable amount of time.     Equipment Recommendations   None recommended by OT     Recommendations for Other Services         Precautions/Restrictions   Precautions Precautions: Fall Recall  of Precautions/Restrictions: Intact Restrictions Weight Bearing Restrictions Per Provider Order: No     Mobility Bed Mobility Overal bed mobility: Needs Assistance Bed Mobility: Supine to Sit     Supine to sit: Supervision          Transfers Overall transfer level: Needs assistance Equipment used: Rolling walker (2 wheels) Transfers: Sit to/from Stand Sit to Stand: Contact guard assist                  Balance Overall balance assessment: Needs assistance Sitting-balance support: Feet supported Sitting balance-Leahy Scale: Good     Standing balance support: Bilateral upper extremity supported, Single extremity supported Standing balance-Leahy Scale: Fair                             ADL either performed or assessed with clinical judgement   ADL Overall ADL's : Needs assistance/impaired Eating/Feeding: Independent;Sitting Eating/Feeding Details (indicate cue type and reason): eating when entering the room Grooming: Wash/dry hands;Set up;Sitting   Upper Body Bathing: Set up;Sitting   Lower Body Bathing: Sit to/from stand;Sitting/lateral leans;Contact guard assist   Upper Body Dressing : Set up   Lower Body Dressing: Contact guard assist   Toilet Transfer: Contact guard assist;Rolling walker (2 wheels)   Toileting- Clothing Manipulation and Hygiene: Contact guard assist       Functional mobility during ADLs: Contact guard assist;Rolling walker (2 wheels)       Vision Ability to See in Adequate Light: 0 Adequate Patient Visual Report: Other (comment) Vision Assessment?: Yes Eye Alignment: Impaired (comment) (  L eye gaze to the L side) Ocular Range of Motion: Restricted on the left;Restricted on the right Alignment/Gaze Preference: Chin down;Gaze left (L eye shut) Additional Comments: Pt at this time did not report double vision. He often intiallt with reading and distance will shut L eye. He then noted to also have decrease in strength in  occular muscles with keeping eyelid compltetly open. When having pt cover R eye and then covering half L side or R side of L eye did not improve the vision. Noted throughout the session he kept gaze to L side and had increase in difficulties with tracking items in horizontal plane but was able to completed some verticle tracking.     Perception Perception: Impaired Preception Impairment Details: Spatial orientation     Praxis         Pertinent Vitals/Pain Pain Assessment Pain Assessment: No/denies pain     Extremity/Trunk Assessment Upper Extremity Assessment Upper Extremity Assessment: LUE deficits/detail;Right hand dominant LUE Deficits / Details: Pt reported at baseline LUE/Le are slightly weaker versus R side due to past hx of stroke but WFL LUE Sensation: WNL LUE Coordination: WNL   Lower Extremity Assessment Lower Extremity Assessment: Defer to PT evaluation   Cervical / Trunk Assessment Cervical / Trunk Assessment: Normal   Communication Communication Communication: Impaired Factors Affecting Communication: Reduced clarity of speech   Cognition Arousal: Alert Behavior During Therapy: WFL for tasks assessed/performed Cognition: No apparent impairments             OT - Cognition Comments: Pt noted reporting starting to be a little forgetful but would never forget medications                 Following commands: Intact       Cueing  General Comments   Cueing Techniques: Verbal cues  VSS on RA   Exercises     Shoulder Instructions      Home Living Family/patient expects to be discharged to:: Private residence Living Arrangements: Alone;Children (daughter) Available Help at Discharge: Available PRN/intermittently Type of Home: House Home Access: Stairs to enter Entrance Stairs-Number of Steps: 4 Entrance Stairs-Rails: Right Home Layout: One level     Bathroom Shower/Tub: Tub/shower unit;Curtain   Bathroom Toilet: Standard Bathroom  Accessibility: Yes   Home Equipment: Agricultural consultant (2 wheels);Rollator (4 wheels);Cane - single point;Wheelchair - manual;Shower seat (suction cup grab bars in shower)   Additional Comments: daughter works from home and often works from the [employer]      Prior Functioning/Environment Prior Level of Function : Independent/Modified Independent             Mobility Comments: Pt uses cane for in home/out of home and with stairs. ADLs Comments: independent in ADL, home management and driving. Pt reports daughter orders his grocery pick up and intermittently supervises/checks to ensure proper medication management by checking his pill box after he sets it up.    OT Problem List: Impaired balance (sitting and/or standing);Impaired vision/perception;Decreased safety awareness;Decreased knowledge of use of DME or AE;Cardiopulmonary status limiting activity   OT Treatment/Interventions: Self-care/ADL training;Therapeutic activities;Patient/family education;Balance training      OT Goals(Current goals can be found in the care plan section)   Acute Rehab OT Goals Patient Stated Goal: to return home OT Goal Formulation: With patient Time For Goal Achievement: 12/10/23 Potential to Achieve Goals: Fair   OT Frequency:  Min 2X/week    Co-evaluation  AM-PAC OT "6 Clicks" Daily Activity     Outcome Measure Help from another person eating meals?: None Help from another person taking care of personal grooming?: A Little Help from another person toileting, which includes using toliet, bedpan, or urinal?: A Little Help from another person bathing (including washing, rinsing, drying)?: A Little Help from another person to put on and taking off regular upper body clothing?: A Little Help from another person to put on and taking off regular lower body clothing?: A Little 6 Click Score: 19   End of Session Equipment Utilized During Treatment: Gait belt;Rolling walker (2  wheels) Nurse Communication: Mobility status  Activity Tolerance: Patient tolerated treatment well Patient left: in chair;with call bell/phone within reach  OT Visit Diagnosis: Unsteadiness on feet (R26.81);Other abnormalities of gait and mobility (R26.89);Muscle weakness (generalized) (M62.81)                Time: 1610-9604 OT Time Calculation (min): 43 min Charges:  OT General Charges $OT Visit: 1 Visit OT Treatments $Self Care/Home Management : 8-22 mins $Neuromuscular Re-education: 8-22 mins  Adrian Rodgers OTR/L  Acute Rehab Services  [phone] office number   Stevphen Elders 11/26/2023, 11:41 AM

## 2023-11-26 NOTE — Plan of Care (Signed)

## 2023-11-26 NOTE — ED Notes (Signed)
 All belongings in 2 bags and sent with [facility]

## 2023-11-26 NOTE — ED Notes (Signed)
 Report given to [facility].

## 2023-11-26 NOTE — Progress Notes (Signed)
 Pt has LNQ11 Reveal LINQ ICD, MD and MRI aware, MRI confirms that this is compatible. MD aware, ok to travel off monitor, transport being sent at this time. Pt denies claustrophobia and has no further needs at this time.

## 2023-11-26 NOTE — ED Notes (Addendum)
 Pt resting in bed, no c/o pain Alert and oriented, neuro intact Pt's left eye still deviates but he reports he is not seeing double at this time

## 2023-11-26 NOTE — Care Management Obs Status (Signed)
 MEDICARE OBSERVATION STATUS NOTIFICATION   Patient Details  Name: Adrian Rodgers MRN: [id number] Date of Birth: April 19, 1941   Medicare Observation Status Notification Given:       Janith Melnick 11/26/2023, 8:49 AM

## 2023-11-26 NOTE — Progress Notes (Addendum)
 STROKE TEAM PROGRESS NOTE   SUBJECTIVE (INTERVAL HISTORY) His daughter is at the bedside.  Overall his condition is stable. Still has left eye INO, but moving all extremities. Daughter stated that pt takes eliquis  at home but no plavix . Pt still smoking and no plan to quit.    OBJECTIVE Temp:  [97 F (36.1 C)-98.4 F (36.9 C)] 97 F (36.1 C) (05/28 1127) Pulse Rate:  [61-84] 61 (05/28 1127) Cardiac Rhythm: Normal sinus rhythm;Heart block;Bundle branch block (05/28 0700) Resp:  [18-21] 18 (05/28 1127) BP: (143-169)/(82-106) 150/82 (05/28 1127) SpO2:  [90 %-99 %] 90 % (05/28 0837) Weight:  [83.3 kg-85.2 kg] 83.3 kg (05/28 0500)  No results for input(s): "GLUCAP" in the last 168 hours. Recent Labs  Lab 11/25/23 2030  NA 145  K 3.5  CL 108  CO2 26  GLUCOSE 84  BUN 11  CREATININE 0.97  CALCIUM  9.2   No results for input(s): "AST", "ALT", "ALKPHOS", "BILITOT", "PROT", "ALBUMIN" in the last 168 hours. Recent Labs  Lab 11/25/23 2030  WBC 5.1  NEUTROABS 2.8  HGB 14.0  HCT 42.5  MCV 93.4  PLT 367   No results for input(s): "CKTOTAL", "CKMB", "CKMBINDEX", "TROPONINI" in the last 168 hours. No results for input(s): "LABPROT", "INR" in the last 72 hours. No results for input(s): "COLORURINE", "LABSPEC", "PHURINE", "GLUCOSEU", "HGBUR", "BILIRUBINUR", "KETONESUR", "PROTEINUR", "UROBILINOGEN", "NITRITE", "LEUKOCYTESUR" in the last 72 hours.  Invalid input(s): "APPERANCEUR"     Component Value Date/Time   CHOL 135 11/26/2023 0649   TRIG 50 11/26/2023 0649   HDL 40 (L) 11/26/2023 0649   CHOLHDL 3.4 11/26/2023 0649   VLDL 10 11/26/2023 0649   LDLCALC 85 11/26/2023 0649   Lab Results  Component Value Date   HGBA1C 5.8 (H) 11/26/2023      Component Value Date/Time   LABOPIA NONE DETECTED 03/26/2021 0600   COCAINSCRNUR NONE DETECTED 03/26/2021 0600   LABBENZ NONE DETECTED 03/26/2021 0600   AMPHETMU NONE DETECTED 03/26/2021 0600   THCU NONE DETECTED 03/26/2021 0600    LABBARB NONE DETECTED 03/26/2021 0600    No results for input(s): "ETH" in the last 168 hours.  I have personally reviewed the radiological images below and agree with the radiology interpretations.  MR BRAIN W WO CONTRAST Result Date: 11/26/2023 CLINICAL DATA:  83 year old male with TIA, neurologic deficit: Double vision and left eye deviation. EXAM: MRI HEAD WITHOUT AND WITH CONTRAST TECHNIQUE: Multiplanar, multiecho pulse sequences of the brain and surrounding structures were obtained without and with intravenous contrast. CONTRAST:  8mL GADAVIST GADOBUTROL 1 MMOL/ML IV SOLN COMPARISON:  CT head, CTA head and neck yesterday. Brain MRI 03/27/2021. FINDINGS: Brain: Subcentimeter area of restricted diffusion along the dorsal left midbrain near the just caudal to the left cerebral aqueduct (series 5, image 68 and series 6, image 18). Elsewhere midbrain and brainstem diffusion seems to remain normal. The finding is also visible on coronal DWI series 7, image 55. No other restricted diffusion identified. No midline shift, mass effect, evidence of mass lesion, ventriculomegaly, extra-axial collection or acute intracranial hemorrhage. Cervicomedullary junction and pituitary are within normal limits. Small chronic infarcts in the bilateral cerebellum, left basal ganglia and left thalamus. Patchy chronic T2 heterogeneity in the pons. Patchy and confluent bilateral cerebral white matter T2 and FLAIR hyperintensity. No convincing chronic cerebral blood products. Small area of chronic cortical encephalomalacia in the right superior frontal gyrus, perirolandic. No abnormal enhancement identified. No dural thickening. Vascular: Major intracranial vascular flow voids are stable since 2022.  Some generalized intracranial artery tortuosity. Following contrast the major dural venous sinuses are enhancing and appear to be patent. Skull and upper cervical spine: Negative for age visible cervical spine. Visualized bone marrow  signal is within normal limits. Sinuses/Orbits: Normal suprasellar cistern. Cavernous sinus appears symmetric and normal. Orbits and paranasal sinuses appears stable and negative. Other: Visible internal auditory structures appear normal. Mastoids are clear. IMPRESSION: 1. Positive for a subcentimeter Acute Infarct at the left dorsal midbrain. Query left medial longitudinal fasciculus involvement. No associated hemorrhage or mass effect. 2. 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 Electronically Signed   By: Marlise Simpers M.D.   On: 11/26/2023 04:35   CT ANGIO HEAD NECK W WO CM Result Date: 11/25/2023 CLINICAL DATA:  Stroke/TIA.  Epiploic via. EXAM: CT ANGIOGRAPHY HEAD AND NECK WITH AND WITHOUT CONTRAST TECHNIQUE: Multidetector CT imaging of the head and neck was performed using the standard protocol during bolus administration of intravenous contrast. Multiplanar CT image reconstructions and MIPs were obtained to evaluate the vascular anatomy. Carotid stenosis measurements (when applicable) are obtained utilizing NASCET criteria, using the distal internal carotid diameter as the denominator. RADIATION DOSE REDUCTION: This exam was performed according to the departmental dose-optimization program which includes automated exposure control, adjustment of the mA and/or kV according to patient size and/or use of iterative reconstruction technique. CONTRAST:  75mL OMNIPAQUE  IOHEXOL  350 MG/ML SOLN COMPARISON:  04/08/2022, 03/25/2021 FINDINGS: CT HEAD FINDINGS Brain: There is no mass, hemorrhage or extra-axial collection. There is generalized atrophy without lobar predilection. Hypodensity of the white matter is most commonly associated with chronic microvascular disease. Bilateral basal ganglia mineralization. Vascular: No hyperdense vessel or unexpected vascular calcification. Skull: The visualized skull base, calvarium and extracranial soft tissues are normal.  Sinuses/Orbits: No fluid levels or advanced mucosal thickening of the visualized paranasal sinuses. No mastoid or middle ear effusion. Normal orbits. CTA NECK FINDINGS Skeleton: No acute abnormality or high grade bony spinal canal stenosis. Other neck: Normal pharynx, larynx and major salivary glands. No cervical lymphadenopathy. Unremarkable thyroid gland. Upper chest: Biapical emphysema Aortic arch: There is calcific atherosclerosis of the aortic arch. Conventional 3 vessel aortic branching pattern. RIGHT carotid system: Normal without aneurysm, dissection or stenosis. LEFT carotid system: Normal without aneurysm, dissection or stenosis. Vertebral arteries: Codominant configuration. There is no dissection, occlusion or flow-limiting stenosis to the skull base (V1-V3 segments). CTA HEAD FINDINGS POSTERIOR CIRCULATION: Vertebral arteries are normal. No proximal occlusion of the anterior or inferior cerebellar arteries. Basilar artery is normal. Superior cerebellar arteries are normal. Posterior cerebral arteries are normal. ANTERIOR CIRCULATION: Intracranial internal carotid arteries are normal. Anterior cerebral arteries are normal. Middle cerebral arteries are normal. Venous sinuses: As permitted by contrast timing, patent. Anatomic variants: None Review of the MIP images confirms the above findings. IMPRESSION: No emergent large vessel occlusion or high-grade stenosis of the intracranial arteries. Aortic Atherosclerosis (ICD10-I70.0) and Emphysema (ICD10-J43.9). Electronically Signed   By: Juanetta Nordmann M.D.   On: 11/25/2023 22:16   CUP PACEART REMOTE DEVICE CHECK Result Date: 11/20/2023 ILR summary report received. Battery status OK. Normal device function. No new symptom, tachy, brady, or pause episodes. No new AF episodes. Monthly summary reports and ROV/PRN LA, CVRS    PHYSICAL EXAM  Temp:  [97 F (36.1 C)-98.4 F (36.9 C)] 97 F (36.1 C) (05/28 1127) Pulse Rate:  [61-84] 61 (05/28 1127) Resp:   [18-21] 18 (05/28 1127) BP: (143-169)/(82-106) 150/82 (05/28 1127) SpO2:  [90 %-99 %]  90 % (05/28 0837) Weight:  [83.3 kg-85.2 kg] 83.3 kg (05/28 0500)  General - Well nourished, well developed, in no apparent distress.  Ophthalmologic - fundi not visualized due to noncooperation.  Cardiovascular - Regular rhythm and rate, not in A-fib.  Neuro - awake, alert, eyes open, orientated to age, place, time and people. No aphasia, paucity of speech with mild to moderate dysarthria, following all simple commands. Able to name and repeat. Left eye INO, visual field full, PERRL. No facial droop. Tongue midline. Bilateral UEs 5/5, no drift. Bilaterally LEs 5/5, no drift. Sensation symmetrical bilaterally, b/l FTN intact, gait not tested.    ASSESSMENT/PLAN [name] is a 83 y.o. male with history of hypertension, hyperlipidemia, A-fib on Eliquis , smoker and stroke admitted for diplopia. No TNK given due to outside window.    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 Continue on discharge Patient counseled to be compliant with his antithrombotic medications Ongoing aggressive stroke risk factor management Therapy recommendations:  outpt PT and OT Disposition:  home today  History of stroke 05/2020 admitted for multifocal right ACA infarct.  MRA showed right ACA decreased flow.  CT head and neck showed right ACA diminished flow.  EF 60 to 65%.  LDL 75, A1c 6.2.  Discharged on DAPT and Lipitor  80.  Loop recorder subsequently placed 03/2021 admitted for right brain TIA.  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  MRI  negative for stroke.  EF 70 to 70%.  LDL 83, A1c 6.2.  Discharged on DAPT and Lipitor  80. 03/2021 readmitted for altered mental status.  EEG negative for seizure.  Concerning for recurrent TIA. 07/2023 loop recorder found to have A-fib, started on Eliquis .  PAF Loop recorder placed in 05/2020 A-fib found on loop recorder 07/2023 On Eliquis  home meds  Tobacco abuse Current smoker Smoking cessation counseling provided Pt is not willing to quit  Hypertension Stable on the high and Resume home BP meds on discharge Long term BP goal normotensive  Hyperlipidemia Home meds:  lipitor  80  LDL 85, goal < 70 Now on lipitor  80 and add zetia Continue statin and zetia at discharge  PFO PFO found on 2D echo with bubble study Likely not the cause for patient's stroke No further treatment needed.  Other Stroke Risk Factors Advanced age  Other Active Problems 05/2020 CT showed left along apex mass, 03/2021 CT showed stable  Hospital day # 0  Neurology will sign off. Please call with questions. Pt will follow up with stroke clinic NP Jessica at [facility] in about 4 weeks. Thanks for the consult.   Consuelo Denmark, MD PhD Stroke Neurology 11/26/2023 12:59 PM  I discussed with Dr. Darlyn Eke. I spent additional inpatient 30 minutes face-to-face time with the patient and family, reviewing test results, images and medication, and discussing the diagnosis, treatment plan and potential prognosis. This patient's care requiresreview of multiple databases, neurological assessment, discussion with family, other specialists and medical decision making of high complexity.      To contact Stroke Continuity provider, please refer to [email or web address]. After hours, contact General Neurology

## 2023-11-26 NOTE — Progress Notes (Signed)
 Pt arrived on unit, VSS. NIH 2, L gaze deviation, does not cross midline, does not track horizontally. Loss of visual field in L eye to the right side. Pt has a residual mild left weakness from a prior stroke/TIA's.    [facility] admit & neuro paged. Daughter Adrian Rodgers updated via phone. Pt and family have no further questions or concerns at this time.

## 2023-11-26 NOTE — Evaluation (Signed)
 Physical Therapy Evaluation Patient Details Name: Adrian Rodgers MRN: [id number] DOB: 1940-08-09 Today's Date: 11/26/2023  History of Present Illness  Pt is a 83 year old male who presents 11/25/23 from [facility] due to experience double vision in his L eye. CT negative. 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 PMH significant for  hyperlipidemia, gastric ulcer, H. pylori, hypertension, CVA, PNA and tobacco abuse.   Clinical Impression  Pt in recliner upon arrival and agreeable to PT eval. PTA, pt was ModI with SP cane for mobility. In today's session, pt had impaired balance requiring use of RW for stability. Pt also had slight difficulty navigating the hallway due to impaired vision. He was able to ambulate 260 ft with RW and CGA and ascend/descend 4 steps with 1HR and CGA. Pt will have assist from daughter intermittently and reported she will be present when negotiating steps. Discussed proper guarding with pt verbalizing understanding. Pt currently with functional limitations due to the deficits listed below (see PT Problem List). Pt would benefit from acute skilled PT to address functional impairments. Recommending post-acute OP PT to work on balance deficits and improving mobility.  Acute PT to follow.         If plan is discharge home, recommend the following: A little help with walking and/or transfers;A little help with bathing/dressing/bathroom;Assistance with cooking/housework;Assist for transportation;Help with stairs or ramp for entrance   Can travel by private vehicle    Yes    Equipment Recommendations None recommended by PT     Functional Status Assessment Patient has had a recent decline in their functional status and demonstrates the ability to make significant improvements in function in a reasonable and predictable amount of time.     Precautions / Restrictions  Precautions Precautions: Fall Restrictions Weight Bearing Restrictions Per Provider Order: No      Mobility  Bed Mobility Overal bed mobility: Needs Assistance Bed Mobility: Sit to Supine     Sit to supine: Supervision      Transfers Overall transfer level: Needs assistance Equipment used: Rolling walker (2 wheels) Transfers: Sit to/from Stand Sit to Stand: Contact guard assist    General transfer comment: CGA for safety, cues for hand placement with RW    Ambulation/Gait Ambulation/Gait assistance: Contact guard assist Gait Distance (Feet): 260 Feet Assistive device: Rolling walker (2 wheels) Gait Pattern/deviations: Step-through pattern, Decreased stride length, Decreased dorsiflexion - right, Decreased dorsiflexion - left Gait velocity: decr     General Gait Details: decreased foot clearance bilaterally with pt reporting pain in L foot from a callus. Able to stay close to RW with no overt LOB  Stairs Stairs: Yes Stairs assistance: Contact guard assist Stair Management: One rail Left, Step to pattern, Forwards Number of Stairs: 4 General stair comments: steady with use of 1HR. Pt will have family present when ascending/descending steps and will be able to carry RW. Discussed safety with guarding with pt verbalizing understanding     Balance Overall balance assessment: Mild deficits observed, not formally tested        Pertinent Vitals/Pain Pain Assessment Pain Assessment: No/denies pain    Home Living Family/patient expects to be discharged to:: Private residence Living Arrangements: Alone;Children (daughter) Available Help at Discharge: Available PRN/intermittently Type of Home: House Home Access: Stairs to enter Entrance Stairs-Rails: Right Entrance Stairs-Number of Steps: 4   Home Layout: One level Home Equipment: Agricultural consultant (2 wheels);Rollator (4 wheels);Cane -  single point;Wheelchair - manual;Shower seat (grab bars in shower but suction cup)       Prior Function Prior Level of Function : Independent/Modified Independent    Mobility Comments: Pt uses cane for in home/out of home and with stairs. ADLs Comments: independent in ADL, home management and driving. Pt reports daughter orders his grocery pick up and intermittently supervises/checks to ensure proper medication management by checking his pill box after he sets it up.     Extremity/Trunk Assessment   Upper Extremity Assessment Upper Extremity Assessment: Defer to OT evaluation    Lower Extremity Assessment Lower Extremity Assessment: Generalized weakness    Cervical / Trunk Assessment Cervical / Trunk Assessment: Normal  Communication   Communication Communication: No apparent difficulties    Cognition Arousal: Alert Behavior During Therapy: WFL for tasks assessed/performed   PT - Cognitive impairments: No apparent impairments    Following commands: Intact       Cueing Cueing Techniques: Verbal cues     General Comments General comments (skin integrity, edema, etc.): VSS on RA     PT Assessment Patient needs continued PT services  PT Problem List Decreased strength;Decreased activity tolerance;Decreased balance;Decreased mobility;Decreased knowledge of use of DME       PT Treatment Interventions DME instruction;Gait training;Stair training;Functional mobility training;Therapeutic activities;Therapeutic exercise;Balance training;Neuromuscular re-education;Patient/family education    PT Goals (Current goals can be found in the Care Plan section)  Acute Rehab PT Goals Patient Stated Goal: to get stronger PT Goal Formulation: With patient Time For Goal Achievement: 12/10/23 Potential to Achieve Goals: Good    Frequency Min 2X/week        AM-PAC PT "6 Clicks" Mobility  Outcome Measure Help needed turning from your back to your side while in a flat bed without using bedrails?: A Little Help needed moving from lying on your back to sitting on the  side of a flat bed without using bedrails?: A Little Help needed moving to and from a bed to a chair (including a wheelchair)?: A Little Help needed standing up from a chair using your arms (e.g., wheelchair or bedside chair)?: A Little Help needed to walk in hospital room?: A Little Help needed climbing 3-5 steps with a railing? : A Little 6 Click Score: 18    End of Session Equipment Utilized During Treatment: Gait belt Activity Tolerance: Patient tolerated treatment well Patient left: in bed;with call bell/phone within reach Nurse Communication: Mobility status PT Visit Diagnosis: Unsteadiness on feet (R26.81);Other abnormalities of gait and mobility (R26.89);Muscle weakness (generalized) (M62.81)    Time: 1610-9604 PT Time Calculation (min) (ACUTE ONLY): 17 min   Charges:   PT Evaluation $PT Eval Low Complexity: 1 Low   PT General Charges $$ ACUTE PT VISIT: 1 Visit       Orysia Blas, PT, DPT Secure Chat Preferred  Rehab Office [phone]   Alissa April Adela Ades 11/26/2023, 11:00 AM

## 2023-12-01 ENCOUNTER — Ambulatory Visit: Attending: Family Medicine | Admitting: Occupational Therapy

## 2023-12-01 ENCOUNTER — Other Ambulatory Visit: Payer: Self-pay

## 2023-12-01 ENCOUNTER — Encounter: Payer: Self-pay | Admitting: Occupational Therapy

## 2023-12-01 DIAGNOSIS — R41842 Visuospatial deficit: Secondary | ICD-10-CM | POA: Insufficient documentation

## 2023-12-01 DIAGNOSIS — R2689 Other abnormalities of gait and mobility: Secondary | ICD-10-CM | POA: Diagnosis present

## 2023-12-01 DIAGNOSIS — R41844 Frontal lobe and executive function deficit: Secondary | ICD-10-CM | POA: Insufficient documentation

## 2023-12-01 DIAGNOSIS — R278 Other lack of coordination: Secondary | ICD-10-CM | POA: Insufficient documentation

## 2023-12-01 DIAGNOSIS — H532 Diplopia: Secondary | ICD-10-CM | POA: Diagnosis present

## 2023-12-01 DIAGNOSIS — R41840 Attention and concentration deficit: Secondary | ICD-10-CM | POA: Insufficient documentation

## 2023-12-01 DIAGNOSIS — I639 Cerebral infarction, unspecified: Secondary | ICD-10-CM | POA: Diagnosis present

## 2023-12-01 NOTE — Therapy (Addendum)
 OUTPATIENT OCCUPATIONAL THERAPY NEURO EVALUATION  Patient Name: Adrian Rodgers MRN: [id number] DOB:March 05, 1941, 83 y.o., male Today's Date: 12/01/2023  PCP: Ivor Mars NP REFERRING PROVIDER: Dr. Darlyn Eke  END OF SESSION:  OT End of Session - 12/01/23 1403     Visit Number 1    Number of Visits 17    Date for OT Re-Evaluation 02/16/24    Authorization Type [employer] Medicare    Authorization Time Period 9    Authorization - Visit Number 1    Progress Note Due on Visit 10    OT Start Time 1400    OT Stop Time 1440    OT Time Calculation (min) 40 min    Activity Tolerance Patient tolerated treatment well    Behavior During Therapy [facility] for tasks assessed/performed             Past Medical History:  Diagnosis Date   Anemia    Aortic atherosclerosis (HCC)    Cerebral aneurysm    Community acquired pneumonia of right middle lobe of lung 09/20/2017   Emphysema of lung (HCC)    Esophagitis    Gastric ulcer    H. 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)    Stroke (HCC) 05/01/2020   Tobacco dependence    Past Surgical History:  Procedure Laterality Date   BIOPSY  04/10/2022   Procedure: BIOPSY;  Surgeon: Ace Holder, MD;  Location: [facility] ENDOSCOPY;  Service: Gastroenterology;;   ESOPHAGOGASTRODUODENOSCOPY (EGD) WITH PROPOFOL  N/A 04/10/2022   Procedure: ESOPHAGOGASTRODUODENOSCOPY (EGD) WITH PROPOFOL ;  Surgeon: Ace Holder, MD;  Location: [facility] ENDOSCOPY;  Service: Gastroenterology;  Laterality: N/A;   HEMOSTASIS CONTROL  04/10/2022   Procedure: HEMOSTASIS CONTROL;  Surgeon: Ace Holder, MD;  Location: [facility] ENDOSCOPY;  Service: Gastroenterology;;  purastat    implantable loop recorder placement  06/05/2020   Medtronic Reveal [place] model [phone]) implantable loop recorder    Patient Active Problem List   Diagnosis Date Noted   Blurry vision, bilateral  11/25/2023   Acute ischemic stroke (HCC) 11/25/2023   Paroxysmal atrial fibrillation (HCC) 06/24/2023   Hypercoagulable state due to paroxysmal atrial fibrillation (HCC) 06/24/2023   BPH (benign prostatic hyperplasia) 12/20/2022   GERD without esophagitis 12/20/2022   Sepsis due to pneumonia (HCC) 12/20/2022   LLL pneumonia 12/19/2022   GI bleed 04/11/2022   Upper GI bleed 04/10/2022   Acute gastric ulcer with hemorrhage 04/10/2022   Dark stools 04/09/2022   Heme positive stool 04/09/2022   Anemia 04/09/2022   Antiplatelet or antithrombotic long-term use 04/09/2022   Hypotension due to hypovolemia 04/08/2022   SIRS (systemic inflammatory response syndrome) (HCC) 04/08/2022   Acute encephalopathy 04/08/2022   Tobacco abuse 03/27/2022   Acute respiratory failure due to COVID-19 (HCC) 03/25/2022   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   Generalized weakness 03/27/2021   Ascending aortic aneurysm (HCC) 09/22/2020   COPD (chronic obstructive pulmonary disease) (HCC) 05/18/2020   Essential hypertension 05/13/2020   Dyslipidemia 05/13/2020   History of CVA (cerebrovascular accident) 05/12/2020    ONSET DATE: 11/25/23  REFERRING DIAG:  Diagnosis  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  I63.9 (ICD-10-CM) - Acute ischemic stroke (HCC)    THERAPY DIAG:  Visuospatial deficit - Plan: Ot plan of care cert/re-cert  Other lack of coordination - Plan: Ot plan of care cert/re-cert  Other abnormalities of gait and mobility -  Plan: Ot plan of care cert/re-cert  Frontal lobe and executive function deficit - Plan: Ot plan of care cert/re-cert  Attention and concentration deficit - Plan: Ot plan of care cert/re-cert  Rationale for Evaluation and Treatment: Rehabilitation  SUBJECTIVE:   SUBJECTIVE STATEMENT: Pt reports double vision Pt accompanied by: family member  PERTINENT HISTORY: Pt is a 83 year old male transferred f from [facility] 11/25/23 due to  experience double vision in his L eye. CT negative. 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 PMH significant for hyperlipidemia, gastric ulcer, H. pylori, hypertension, CVA, PNA and tobacco abuse.   PRECAUTIONS: Fall  WEIGHT BEARING RESTRICTIONS: No  PAIN:  Are you having pain? No  FALLS: Has patient fallen in last 6 months? No  LIVING ENVIRONMENT: Lives with: lives alone Lives in: House/apartment Stairs: No Has following equipment at home: Quad cane small base and grab bar a tub  PLOF: Independent  PATIENT GOALS: improve vision  OBJECTIVE:  Note: Objective measures were completed at Evaluation unless otherwise noted.  HAND DOMINANCE: Right  ADLs: Overall ADLs: mod I with all basic ADLS. Transfers/ambulation related to ADLs:mod I with walker   IADLs:  Light housekeeping: has not attempted yet Meal Prep: Pt's dtr has been preparing meals and he has been warming in the microwave. Community mobility: supervision with small based quad cane, Pt has walker and he practiced using in clinic with improved safety. Medication management: dtr fills pillbox, alexa reminds pt to take meds Financial management: Pt's dtr handles    MOBILITY STATUS: decreased balance uses quad cane or walker     FUNCTIONAL OUTCOME MEASURES: 81M number cancellation- 88% accuracy  UPPER EXTREMITY ROM:  WFLS    UPPER EXTREMITY MMT:   bilateral UE strength is grossly 4+/5    HAND FUNCTION: Grip strength: Right: 35 lbs; Left: 30 lbs  COORDINATION: 9 Hole Peg test: Right: 41.71 sec; Left: 35.66 sec  SENSATION: WFL      COGNITION: Overall cognitive status: Impaired, deceased short term memory, spells WORLD backwards   VISION: Subjective report: Pt reports double vision   VISION ASSESSMENT:  L eye does not track to right side Impaired vision Eye alignment: 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 Pt was unsteady ambulating with quad cane. He has a walker at home, and he used the walker to walkt to the waiting room with better balance. Pt was encouraged to use his walker for safety.  TREATMENT DATE: 12/01/23 eval only        PATIENT EDUCATION: Education details: role of OT and potential goals Person educated: Patient Education method: Explanation Education comprehension: verbalized understanding  HOME EXERCISE PROGRAM: n/a   GOALS: Goals reviewed with patient? Yes  SHORT TERM GOALS: Target date: 12/31/23  I with inital HEP for vision.  Goal status: INITIAL  2.  I with compensatory strategies for visual deficits.  Goal status: INITIAL  3.  Pt will perform tabletop scanning activities with 90% or better accuracy, using compensations prn.  Goal status: INITIAL  4.  Pt will navigate a busy environment, locating items with 75% accuracy without LOB   Goal status: INITIAL  5.  I with memory compensations  Goal status: INITIAL    LONG TERM GOALS: Target date: 02/02/24  I with updated HEP  Goal status: INITIAL  2.  Pt will demonstrate improved RUE fine motor coordination as evidenced by decreasing 9 hole peg test score to 35 secs or less.  Goal status: INITIAL  3.  Pt will perform environmental scanning activities with 90% or better accuracy, using compensations prn,  without LOB  Goal  status: INITIAL  4.  Pt will demonstrate ability to read a page of newsprint without partial occulsion and minimal reports of diplopia.  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 Pt was tranferred from [facility] 11/25/23 due to experiencing double vision in his L eye. CT negative. 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 PMH significant for hyperlipidemia, gastric ulcer, H. pylori, hypertension, CVA, PNA and tobacco abuse.  Pt presents with the perfromance defcitis below. He can benefit from skilled occupational therapy to address these deficits in order to maximize pt's safety and I with ADLS/ IADLs. Adrian Rodgers   PERFORMANCE DEFICITS: in functional skills including ADLs, IADLs, coordination, dexterity, Fine motor control, mobility, balance, endurance, decreased knowledge of precautions, vision, and UE functional use, cognitive skills including memory, problem solving, and safety awareness, and psychosocial skills including coping strategies, environmental adaptation, habits, interpersonal interactions, and routines and behaviors.   IMPAIRMENTS: are limiting patient from ADLs, IADLs, play, leisure, and social participation.   CO-MORBIDITIES: may have co-morbidities  that affects occupational performance. Patient will benefit from skilled OT to address above impairments and improve overall function.  MODIFICATION OR ASSISTANCE TO COMPLETE EVALUATION: Min-Moderate modification of tasks or assist with assess necessary to complete an evaluation.  OT OCCUPATIONAL PROFILE AND HISTORY: Detailed assessment: Review of records and additional review of physical, cognitive, psychosocial history related to  current functional performance.  CLINICAL DECISION MAKING: LOW - limited treatment options, no task modification necessary  REHAB POTENTIAL: Good  EVALUATION COMPLEXITY: Low    PLAN:  OT FREQUENCY: 16 visits  OT DURATION: 10 weeks  PLANNED INTERVENTIONS: 97168 OT Re-evaluation, 97535 self care/ADL training, 81191 therapeutic exercise, 97530 therapeutic activity, 97112 neuromuscular re-education, 97140 manual therapy, 97116 gait training, 47829 ultrasound, 97018 paraffin, 56213 moist heat, 97010 cryotherapy, 97129 Cognitive training (first 15 min), 08657 Cognitive training(each additional 15 min), passive range of motion, balance training, functional mobility training, visual/perceptual remediation/compensation, energy conservation, coping strategies training, patient/family education, and DME and/or AE instructions  RECOMMENDED OTHER SERVICES: PT  CONSULTED AND AGREED WITH PLAN OF CARE: Patient and  family member/caregiver  PLAN FOR NEXT SESSION: inital HEP for vision   Adrian Rodgers, OT 12/01/2023, 5:17 PM

## 2023-12-04 ENCOUNTER — Ambulatory Visit: Admitting: Occupational Therapy

## 2023-12-04 ENCOUNTER — Encounter: Payer: Self-pay | Admitting: Occupational Therapy

## 2023-12-04 DIAGNOSIS — R41842 Visuospatial deficit: Secondary | ICD-10-CM

## 2023-12-04 DIAGNOSIS — R278 Other lack of coordination: Secondary | ICD-10-CM

## 2023-12-04 DIAGNOSIS — R41844 Frontal lobe and executive function deficit: Secondary | ICD-10-CM

## 2023-12-04 DIAGNOSIS — R2689 Other abnormalities of gait and mobility: Secondary | ICD-10-CM

## 2023-12-04 DIAGNOSIS — R41840 Attention and concentration deficit: Secondary | ICD-10-CM

## 2023-12-04 NOTE — Therapy (Signed)
 OUTPATIENT OCCUPATIONAL THERAPY NEURO EVALUATION  Patient Name: Adrian Rodgers MRN: [id number] DOB:Mar 21, 1941, 83 y.o., male Today's Date: 12/04/2023  PCP: Ivor Mars NP REFERRING PROVIDER: Dr. Darlyn Eke  END OF SESSION:  OT End of Session - 12/04/23 0805     Visit Number 2    Number of Visits 17    Date for OT Re-Evaluation 02/16/24    Authorization Type [employer] Medicare    Authorization Time Period 16 visits authorized through 02/09/24    Authorization - Visit Number 1    Progress Note Due on Visit 10    OT Start Time 0801    OT Stop Time 0841    OT Time Calculation (min) 40 min             Past Medical History:  Diagnosis Date   Anemia    Aortic atherosclerosis (HCC)    Cerebral aneurysm    Community acquired pneumonia of right middle lobe of lung 09/20/2017   Emphysema of lung (HCC)    Esophagitis    Gastric ulcer    H. 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)    Stroke (HCC) 05/01/2020   Tobacco dependence    Past Surgical History:  Procedure Laterality Date   BIOPSY  04/10/2022   Procedure: BIOPSY;  Surgeon: Ace Holder, MD;  Location: [facility] ENDOSCOPY;  Service: Gastroenterology;;   ESOPHAGOGASTRODUODENOSCOPY (EGD) WITH PROPOFOL  N/A 04/10/2022   Procedure: ESOPHAGOGASTRODUODENOSCOPY (EGD) WITH PROPOFOL ;  Surgeon: Ace Holder, MD;  Location: [facility] ENDOSCOPY;  Service: Gastroenterology;  Laterality: N/A;   HEMOSTASIS CONTROL  04/10/2022   Procedure: HEMOSTASIS CONTROL;  Surgeon: Ace Holder, MD;  Location: [facility] ENDOSCOPY;  Service: Gastroenterology;;  purastat    implantable loop recorder placement  06/05/2020   Medtronic Reveal [place] model [phone] [id number]) implantable loop recorder    Patient Active Problem List   Diagnosis Date Noted   Blurry vision, bilateral 11/25/2023   Acute ischemic stroke (HCC) 11/25/2023   Paroxysmal atrial  fibrillation (HCC) 06/24/2023   Hypercoagulable state due to paroxysmal atrial fibrillation (HCC) 06/24/2023   BPH (benign prostatic hyperplasia) 12/20/2022   GERD without esophagitis 12/20/2022   Sepsis due to pneumonia (HCC) 12/20/2022   LLL pneumonia 12/19/2022   GI bleed 04/11/2022   Upper GI bleed 04/10/2022   Acute gastric ulcer with hemorrhage 04/10/2022   Dark stools 04/09/2022   Heme positive stool 04/09/2022   Anemia 04/09/2022   Antiplatelet or antithrombotic long-term use 04/09/2022   Hypotension due to hypovolemia 04/08/2022   SIRS (systemic inflammatory response syndrome) (HCC) 04/08/2022   Acute encephalopathy 04/08/2022   Tobacco abuse 03/27/2022   Acute respiratory failure due to COVID-19 (HCC) 03/25/2022   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   Generalized weakness 03/27/2021   Ascending aortic aneurysm (HCC) 09/22/2020   COPD (chronic obstructive pulmonary disease) (HCC) 05/18/2020   Essential hypertension 05/13/2020   Dyslipidemia 05/13/2020   History of CVA (cerebrovascular accident) 05/12/2020    ONSET DATE: 11/25/23  REFERRING DIAG:  Diagnosis  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 CT negative. 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 PMH significant for hyperlipidemia, gastric ulcer, H. pylori, hypertension, CVA, PNA and  tobacco abuse.   PRECAUTIONS: Fall  WEIGHT BEARING RESTRICTIONS: No  PAIN:  Are you having pain? No  FALLS: Has patient fallen in last 6 months? No  LIVING ENVIRONMENT: Lives with: lives alone Lives in: House/apartment Stairs: No Has following equipment at home: Quad cane small base and grab bar a tub  PLOF: Independent  PATIENT GOALS: improve vision  OBJECTIVE:  Note: Objective measures were completed at Evaluation unless otherwise noted.  HAND DOMINANCE: Right  ADLs: Overall ADLs: mod I with all basic ADLS. Transfers/ambulation related to ADLs:mod I with walker   IADLs:  Light housekeeping: has not attempted yet Meal Prep: Pt's dtr has been preparing meals and he has been warming in the microwave. Community mobility: supervision with small based quad cane, Pt has walker and he practiced using in clinic with improved safety. Medication management: dtr fills pillbox, alexa reminds pt to take meds Financial management: Pt's dtr handles    MOBILITY STATUS: decreased balance uses quad cane or walker     FUNCTIONAL OUTCOME MEASURES: 66M number cancellation- 88% accuracy  UPPER EXTREMITY ROM:  WFLS    UPPER EXTREMITY MMT:   bilateral UE strength is grossly 4+/5    HAND FUNCTION: Grip strength: Right: 35 lbs; Left: 30 lbs  COORDINATION: 9 Hole Peg test: Right: 41.71 sec; Left: 35.66 sec  SENSATION: WFL      COGNITION: Overall cognitive status: Impaired, deceased short term memory, spells WORLD backwards   VISION: Subjective report: Pt reports double vision   VISION ASSESSMENT:  L eye does not track to right side Impaired vision Eye alignment: Impaired: 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 Pt was unsteady ambulating with quad cane. He has a walker at home, and he used the walker to walkt to the waiting room with better balance. Pt was encouraged to use his walker for safety.                                                                                                                            TREATMENT DATE: 12/04/23-Pt arrived with walker and he ambulates more safely supervision-mod I with walker than quad cane. Pt arrived wearing clear glasses withnasal portion both eye occluded. Therapist worked with pt and  was able to remove tape from nasal portion of right lens. Pt was instucted in  diplopia HEP, mod v.c and demonstration 5-10 reps each. Then beginning coordination HEP    12/01/23 eval only        PATIENT EDUCATION: Education details: use of partial occlusion on glasses, diplopia HEP, beginning coordination HEP Person educated: Patient Education method: Explanation, demonstration, v.c Education comprehension: verbalized understanding, returned demonstration, needs reinforcement  HOME EXERCISE PROGRAM: 12/04/23- diplopia HEP, coordinationHEP- beginning   GOALS: Goals reviewed with patient? Yes  SHORT TERM GOALS: Target date: 12/31/23  I with inital HEP for vision.  Goal status: I ongoing issued, 12/04/23  2.  I with compensatory strategies for visual deficits.  Goal status:  ongoing initiated 12/04/23  3.  Pt will perform tabletop scanning activities with 90% or better accuracy, using compensations prn.  Goal status: INITIAL  4.  Pt will navigate a busy environment, locating items with 75% accuracy without LOB   Goal status: INITIAL  5.  I with memory compensations  Goal status: INITIAL    LONG TERM GOALS: Target date: 02/02/24  I with updated HEP  Goal status:  INITIAL  2.  Pt will demonstrate improved RUE fine motor coordination as evidenced by decreasing 9 hole peg test score to 35 secs or less.  Goal status: INITIAL  3.  Pt will perform environmental scanning activities with 90% or better accuracy, using compensations prn,  without LOB  Goal status: INITIAL  4.  Pt will demonstrate ability to read a page of newsprint without partial occulsion and minimal reports of diplopia.  Goal status: INITIAL   ASSESSMENT:  CLINICAL IMPRESSION: Pt has been issued inital vision and coordinaiton HEP's. He may benefit from review. Aaron Aas   PERFORMANCE DEFICITS: in functional skills including ADLs, IADLs, coordination, dexterity, Fine motor control, mobility, balance, endurance, decreased knowledge of precautions, vision, and UE functional use, cognitive skills including memory, problem solving, and safety awareness, and psychosocial skills including coping strategies, environmental adaptation, habits, interpersonal interactions, and routines and behaviors.   IMPAIRMENTS: are limiting patient from ADLs, IADLs, play, leisure, and social participation.   CO-MORBIDITIES: may have co-morbidities  that affects occupational performance. Patient will benefit from skilled OT to address above impairments and improve overall function.  MODIFICATION OR ASSISTANCE TO COMPLETE EVALUATION: Min-Moderate modification of tasks or assist with assess necessary to complete an evaluation.  OT OCCUPATIONAL PROFILE AND HISTORY: Detailed assessment: Review of records and additional review of physical, cognitive, psychosocial history related to current functional performance.  CLINICAL DECISION MAKING: LOW - limited treatment options, no task modification necessary  REHAB POTENTIAL: Good  EVALUATION COMPLEXITY: Low    PLAN:  OT FREQUENCY: 16 visits  OT DURATION: 10 weeks  PLANNED INTERVENTIONS: 97168 OT Re-evaluation, 97535 self care/ADL training, 29528 therapeutic  exercise, 97530 therapeutic activity, 97112 neuromuscular re-education, 97140 manual therapy, 97116 gait training, 41324 ultrasound, 97018 paraffin, 40102 moist heat, 97010 cryotherapy, 97129 Cognitive training (first 15 min), 72536 Cognitive training(each additional 15 min), passive range of motion, balance training, functional mobility training, visual/perceptual remediation/compensation, energy conservation, coping strategies training, patient/family education, and DME and/or AE instructions  RECOMMENDED OTHER SERVICES: PT  CONSULTED AND AGREED WITH PLAN OF CARE: Patient and family member/caregiver  PLAN FOR NEXT SESSION:  check on inital HEP for vision, review with dtr if she attends   Adyson Vanburen, OT 12/04/2023, 8:29 AM

## 2023-12-04 NOTE — Patient Instructions (Addendum)
 Coordination Activities  Perform the following activities for 10 minutes 1 times per day with right hand(s).  Flip cards 1 at a time  Deal cards with your thumb (Hold deck in hand and push card off top with thumb). Pick up coins and place in container or coin bank. Pick up coins and stack. Pick up coins one at a time until you get 5-10 in your hand, then move coins from palm to fingertips to place in container Practice writing and/or typing.                            Double vision HEP:  Perform at least 1-2 times per day. Stop if your eye becomes fatigued or hurts and try again later.  1. Hold a small object/card in front of you.  Hold it in the middle at arm's length away.    2. Cover your LEFT eye and look at the object with your RIGHT eye.  3. Slowly move the object side to side in front of you while continuing to watch it with your RIGHT eye.  4.  Remember to keep your head still and only move your eye.  5.  Repeat 5-10 times.  6.  Then, move object up and down while watching it 5-10 times.  7. Cover your RIGHT eye and look at the object with your LEFT eye   8  Slowly move the object side to side in front of you while continuing to watch it with your Left eye.  9  Remember to keep your head still and only move your eye.  10  Repeat 5-10 times.  11.  Then, move object up and down while watching it 5-10 times.  12  Now, uncover both eyes and try to focus on the object while holding it in the middle.  Try to make it 1 image.   13  If you can, try to hold it for 10-30 sec increasing as able you can move the card closer then away 5x   14.  Once you can make the image 1 for at least 30 sec in the middle, repeat #1-6 above with both eyes moving slowly and only in the range that you can keep the image   15 Slowly move the object side to side in front of you while continuing to watch it with your BOTH eyes, stop before the image becomes double  16  Remember to keep your head still and only move your eyes.  16 Repeat 5-10 times.  17. Then, move object up and down while watching it 5-10 times BOTH eyes.

## 2023-12-10 NOTE — Progress Notes (Signed)
 [facility] Summary Report / Loop Recorder

## 2023-12-11 ENCOUNTER — Telehealth: Payer: Self-pay | Admitting: Adult Health

## 2023-12-11 NOTE — Telephone Encounter (Signed)
 LVM and sent mychart msg informing pt of need to reschedule 01/05/24 appt - NP out   If patient calls back to reschedule I have held a slot for him with Camilo Cella for 01/06/24 at 10:15am

## 2023-12-15 ENCOUNTER — Encounter: Payer: Self-pay | Admitting: Occupational Therapy

## 2023-12-15 ENCOUNTER — Ambulatory Visit: Admitting: Occupational Therapy

## 2023-12-15 ENCOUNTER — Ambulatory Visit

## 2023-12-15 DIAGNOSIS — R278 Other lack of coordination: Secondary | ICD-10-CM

## 2023-12-15 DIAGNOSIS — R2689 Other abnormalities of gait and mobility: Secondary | ICD-10-CM

## 2023-12-15 DIAGNOSIS — R41842 Visuospatial deficit: Secondary | ICD-10-CM

## 2023-12-15 DIAGNOSIS — R41840 Attention and concentration deficit: Secondary | ICD-10-CM

## 2023-12-15 DIAGNOSIS — R41844 Frontal lobe and executive function deficit: Secondary | ICD-10-CM

## 2023-12-15 DIAGNOSIS — I639 Cerebral infarction, unspecified: Secondary | ICD-10-CM

## 2023-12-15 DIAGNOSIS — H532 Diplopia: Secondary | ICD-10-CM

## 2023-12-15 NOTE — Therapy (Addendum)
 OUTPATIENT OCCUPATIONAL THERAPY NEURO Treatment  Patient Name: Adrian Rodgers MRN: [id number] DOB:1941-01-02, 83 y.o., male Today's Date: 12/15/2023  PCP: Ivor Mars NP REFERRING PROVIDER: Dr. Darlyn Eke  END OF SESSION:  OT End of Session - 12/15/23 1406     Visit Number 3    Number of Visits 17    Date for OT Re-Evaluation 02/16/24    Authorization Type [employer] Medicare    Authorization Time Period 16 visits authorized through 02/09/24    Authorization - Visit Number 3    Progress Note Due on Visit 10    OT Start Time 1326    OT Stop Time 1405    OT Time Calculation (min) 39 min    Activity Tolerance Patient limited by fatigue           Past Medical History:  Diagnosis Date   Anemia    Aortic atherosclerosis (HCC)    Cerebral aneurysm    Community acquired pneumonia of right middle lobe of lung 09/20/2017   Emphysema of lung (HCC)    Esophagitis    Gastric ulcer    H. 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)    Stroke (HCC) 05/01/2020   Tobacco dependence    Past Surgical History:  Procedure Laterality Date   BIOPSY  04/10/2022   Procedure: BIOPSY;  Surgeon: Ace Holder, MD;  Location: [facility] ENDOSCOPY;  Service: Gastroenterology;;   ESOPHAGOGASTRODUODENOSCOPY (EGD) WITH PROPOFOL  N/A 04/10/2022   Procedure: ESOPHAGOGASTRODUODENOSCOPY (EGD) WITH PROPOFOL ;  Surgeon: Ace Holder, MD;  Location: [facility] ENDOSCOPY;  Service: Gastroenterology;  Laterality: N/A;   HEMOSTASIS CONTROL  04/10/2022   Procedure: HEMOSTASIS CONTROL;  Surgeon: Ace Holder, MD;  Location: [facility] ENDOSCOPY;  Service: Gastroenterology;;  purastat    implantable loop recorder placement  06/05/2020   Medtronic Reveal [place] model [id number]) implantable loop recorder    Patient Active Problem List   Diagnosis Date Noted   Blurry vision, bilateral 11/25/2023   Acute ischemic stroke  (HCC) 11/25/2023   Paroxysmal atrial fibrillation (HCC) 06/24/2023   Hypercoagulable state due to paroxysmal atrial fibrillation (HCC) 06/24/2023   BPH (benign prostatic hyperplasia) 12/20/2022   GERD without esophagitis 12/20/2022   Sepsis due to pneumonia (HCC) 12/20/2022   LLL pneumonia 12/19/2022   GI bleed 04/11/2022   Upper GI bleed 04/10/2022   Acute gastric ulcer with hemorrhage 04/10/2022   Dark stools 04/09/2022   Heme positive stool 04/09/2022   Anemia 04/09/2022   Antiplatelet or antithrombotic long-term use 04/09/2022   Hypotension due to hypovolemia 04/08/2022   SIRS (systemic inflammatory response syndrome) (HCC) 04/08/2022   Acute encephalopathy 04/08/2022   Tobacco abuse 03/27/2022   Acute respiratory failure due to COVID-19 (HCC) 03/25/2022   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   Generalized weakness 03/27/2021   Ascending aortic aneurysm (HCC) 09/22/2020   COPD (chronic obstructive pulmonary disease) (HCC) 05/18/2020   Essential hypertension 05/13/2020   Dyslipidemia 05/13/2020   History of CVA (cerebrovascular accident) 05/12/2020    ONSET DATE: 11/25/23  REFERRING DIAG:  Diagnosis  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 CT negative. 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 PMH significant for hyperlipidemia,  gastric ulcer, H. pylori, hypertension, CVA, PNA and tobacco abuse.   PRECAUTIONS: Fall  WEIGHT BEARING RESTRICTIONS: No  PAIN: PAIN:  Are you having pain? Yes: NPRS scale: 7/10 Pain location: left foot, knee Pain description: aching Aggravating factors: walking and weightbearing through it Relieving factors: rest    FALLS: Has patient fallen in last 6 months? No  LIVING ENVIRONMENT: Lives with: lives alone Lives in: House/apartment Stairs: No Has following equipment at home: Quad cane small base and grab bar a tub  PLOF: Independent  PATIENT GOALS: improve vision  OBJECTIVE:  Note: Objective measures were completed at Evaluation unless otherwise noted.  HAND DOMINANCE: Right  ADLs: Overall ADLs: mod I with all basic ADLS. Transfers/ambulation related to ADLs:mod I with walker   IADLs:  Light housekeeping: has not attempted yet Meal Prep: Pt's dtr has been preparing meals and he has been warming in the microwave. Community mobility: supervision with small based quad cane, Pt has walker and he practiced using in clinic with improved safety. Medication management: dtr fills pillbox, alexa reminds pt to take meds Financial management: Pt's dtr handles    MOBILITY STATUS: decreased balance uses quad cane or walker     FUNCTIONAL OUTCOME MEASURES: 41M number cancellation- 88% accuracy  UPPER EXTREMITY ROM:  WFLS    UPPER EXTREMITY MMT:   bilateral UE strength is grossly 4+/5    HAND FUNCTION: Grip strength: Right: 35 lbs; Left: 30 lbs  COORDINATION: 9 Hole Peg test: Right: 41.71 sec; Left: 35.66 sec  SENSATION: WFL      COGNITION: Overall cognitive status: Impaired, deceased short term memory, spells WORLD backwards   VISION: Subjective report: Pt reports double vision   VISION ASSESSMENT:  L eye does not track to right side Impaired vision Eye alignment: 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 Pt was unsteady ambulating with quad cane. He has a walker at home, and he used the walker to walkt to the waiting room with better balance. Pt was encouraged to use his walker for safety.                                                                                                                            TREATMENT  DATE: 12/15/23 Pt was accompanied by his dtr today. Pt ambulated to therapy gym with rollator with close supervision and min v.c Pt was noted to drag feet to avoid weightbearing through LLE where he has a callous. Therapist reviewed pt's vision HEP wiht pt/ dtr, mod v.c for pt performance. 5-10 reps each exercise. Flipping and dealing playing cards with right and left UE's min-mod difficulty, mod v.c, increased time required due to lethargy. Pt was noted to fall asleep several times. Pt returned to waiting room ambulating via rollator with minguard for safety.   12/04/23-Pt arrived with walker and he ambulates more safely supervision-mod I with walker than quad cane. Pt arrived wearing clear glasses withnasal portion both eye occluded. Therapist worked with pt and was able to remove tape from nasal portion of right lens. Pt was instucted in  diplopia HEP, mod v.c and demonstration 5-10 reps each. Then beginning coordination HEP    12/01/23 eval only        PATIENT EDUCATION: Education details: use of partial occlusion on glasses, diplopia HEP,  flipping and dealing cards for coordiantion Person educated: Patient, dtr Education method: Explanation, demonstration,  v.c Education comprehension: verbalized understanding, returned demonstration, v.c.  HOME EXERCISE PROGRAM: 12/04/23- diplopia HEP, coordinationHEP- beginning   GOALS: Goals reviewed with patient? Yes  SHORT TERM GOALS: Target date: 12/31/23  I with inital HEP for vision.  Goal status:  partially met, issued- pt and dtr instructed pt will need some assist from dtr.12/15/23  2.  I with compensatory strategies for visual deficits.  Goal status:  ongoing initiated 6/16//25  3.  Pt will perform tabletop scanning activities with 90% or better accuracy, using compensations prn.  Goal status:  ongoing 12/15/23  4.  Pt will navigate a busy environment, locating items with 75% accuracy without LOB   Goal status: ongoing 12/15/23  5.  I with memory compensations  Goal status: ongoing 12/15/23    LONG TERM GOALS: Target date: 02/02/24  I with updated HEP  Goal status: INITIAL  2.  Pt will demonstrate improved RUE fine motor coordination as evidenced by decreasing 9 hole peg test score to 35 secs or less.  Goal status: INITIAL  3.  Pt will perform environmental scanning activities with 90% or better accuracy, using compensations prn,  without LOB  Goal status: INITIAL  4.  Pt will demonstrate ability to read a page of newsprint without partial occulsion and minimal reports of diplopia.  Goal status: INITIAL   ASSESSMENT:  CLINICAL IMPRESSION: Pt/ dtr were instructed in vision HEP. Pt will benefit from assistance. Pt was very drowsy today and required v.c to stay awake. BP 109/76. Aaron Aas   PERFORMANCE DEFICITS: in functional skills including ADLs, IADLs, coordination, dexterity, Fine motor control, mobility, balance, endurance, decreased knowledge of precautions, vision, and UE functional use, cognitive skills including memory, problem solving, and safety awareness, and psychosocial skills including coping strategies, environmental adaptation, habits, interpersonal interactions, and  routines and behaviors.   IMPAIRMENTS: are limiting patient from ADLs, IADLs, play, leisure, and social participation.   CO-MORBIDITIES: may have co-morbidities  that affects occupational performance. Patient will benefit from skilled OT to address above impairments and improve overall function.  MODIFICATION OR ASSISTANCE TO COMPLETE EVALUATION: Min-Moderate modification of tasks or assist with assess necessary to complete an evaluation.  OT OCCUPATIONAL PROFILE AND HISTORY: Detailed assessment: Review of records and additional review of physical, cognitive, psychosocial history related to current functional performance.  CLINICAL DECISION MAKING: LOW - limited treatment  options, no task modification necessary  REHAB POTENTIAL: Good  EVALUATION COMPLEXITY: Low    PLAN:  OT FREQUENCY: 16 visits  OT DURATION: 10 weeks  PLANNED INTERVENTIONS: 97168 OT Re-evaluation, 97535 self care/ADL training, 16109 therapeutic exercise, 97530 therapeutic activity, 97112 neuromuscular re-education, 97140 manual therapy, 97116 gait training, 60454 ultrasound, 97018 paraffin, 09811 moist heat, 97010 cryotherapy, 97129 Cognitive training (first 15 min), 91478 Cognitive training(each additional 15 min), passive range of motion, balance training, functional mobility training, visual/perceptual remediation/compensation, energy conservation, coping strategies training, patient/family education, and DME and/or AE instructions  RECOMMENDED OTHER SERVICES: PT  CONSULTED AND AGREED WITH PLAN OF CARE: Patient and family member/caregiver  PLAN FOR NEXT SESSION:  vision compensations, visual scanning, memory compensations   Desmin Daleo, OT 12/15/2023, 2:34 PM

## 2023-12-15 NOTE — Therapy (Signed)
 OUTPATIENT PHYSICAL THERAPY NEURO EVALUATION   Patient Name: Adrian Rodgers MRN: [id number] DOB:January 03, 1941, 83 y.o., male Today's Date: 12/15/2023   PCP: Ivor Mars REFERRING PROVIDER: Fernande Howells  END OF SESSION:  PT End of Session - 12/15/23 1445     Visit Number 1    Authorization Type UHC    PT Start Time 1445    PT Stop Time 1530    PT Time Calculation (min) 45 min          Past Medical History:  Diagnosis Date   Anemia    Aortic atherosclerosis (HCC)    Cerebral aneurysm    Community acquired pneumonia of right middle lobe of lung 09/20/2017   Emphysema of lung (HCC)    Esophagitis    Gastric ulcer    H. 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)    Stroke (HCC) 05/01/2020   Tobacco dependence    Past Surgical History:  Procedure Laterality Date   BIOPSY  04/10/2022   Procedure: BIOPSY;  Surgeon: Ace Holder, MD;  Location: [facility] ENDOSCOPY;  Service: Gastroenterology;;   ESOPHAGOGASTRODUODENOSCOPY (EGD) WITH PROPOFOL  N/A 04/10/2022   Procedure: ESOPHAGOGASTRODUODENOSCOPY (EGD) WITH PROPOFOL ;  Surgeon: Ace Holder, MD;  Location: [facility] ENDOSCOPY;  Service: Gastroenterology;  Laterality: N/A;   HEMOSTASIS CONTROL  04/10/2022   Procedure: HEMOSTASIS CONTROL;  Surgeon: Ace Holder, MD;  [facility] ENDOSCOPY;  Service: Gastroenterology;;  purastat    implantable loop recorder placement  06/05/2020   Medtronic Reveal [place] model [id number] ([phone]) implantable loop recorder    Patient Active Problem List   Diagnosis Date Noted   Blurry vision, bilateral 11/25/2023   Acute ischemic stroke (HCC) 11/25/2023   Paroxysmal atrial fibrillation (HCC) 06/24/2023   Hypercoagulable state due to paroxysmal atrial fibrillation (HCC) 06/24/2023   BPH (benign prostatic hyperplasia) 12/20/2022   GERD without esophagitis 12/20/2022   Sepsis due to  pneumonia (HCC) 12/20/2022   LLL pneumonia 12/19/2022   GI bleed 04/11/2022   Upper GI bleed 04/10/2022   Acute gastric ulcer with hemorrhage 04/10/2022   Dark stools 04/09/2022   Heme positive stool 04/09/2022   Anemia 04/09/2022   Antiplatelet or antithrombotic long-term use 04/09/2022   Hypotension due to hypovolemia 04/08/2022   SIRS (systemic inflammatory response syndrome) (HCC) 04/08/2022   Acute encephalopathy 04/08/2022   Tobacco abuse 03/27/2022   Acute respiratory failure due to COVID-19 (HCC) 03/25/2022   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   Generalized weakness 03/27/2021   Ascending aortic aneurysm (HCC) 09/22/2020   COPD (chronic obstructive pulmonary disease) (HCC) 05/18/2020   Essential hypertension 05/13/2020   Dyslipidemia 05/13/2020   History of CVA (cerebrovascular accident) 05/12/2020    ONSET DATE: 11/25/23  REFERRING DIAG:  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 I used the walker sometimes before the stroke.    Pt accompanied by: daughter  PERTINENT HISTORY:  11/25/23 Adrian Rodgers is a 83 y.o. 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   PAIN:  Are you having pain? L foot because I have a callus   PRECAUTIONS: Fall  RED FLAGS: None   WEIGHT BEARING RESTRICTIONS: No  FALLS: Has patient fallen in last 6 months? No  LIVING ENVIRONMENT: Lives with: lives alone Lives in: House/apartment Stairs: Yes: External: 4 in front, 8 in the back steps; on right going up and can reach both Has following equipment at home: Single point cane, Quad cane small base, Walker - 2 wheeled, Environmental consultant - 4 wheeled, Wheelchair (manual), and Grab bars  PLOF: Independent with basic ADLs and Independent with household mobility without device  PATIENT GOALS: walk without assistive device, be able to pass your tests   OBJECTIVE:  Note: Objective measures were completed at Evaluation unless otherwise noted.  DIAGNOSTIC FINDINGS: MRI brain showed small pontine stroke, correlates well to INO.  Dilated by neurology, suspected small vessel disease due to smoking, hypercholesterolemia. - CTA head and neck unremarkable, echocardiogram with PFO, likely unrelated  COGNITION: Overall cognitive status: Within functional limits for tasks assessed   SENSATION: WFL   POSTURE: rounded shoulders and forward head  LOWER EXTREMITY ROM:  WFL    LOWER EXTREMITY MMT:  grossly 4+/5    BED MOBILITY:  Not tested  TRANSFERS: Sit to stand: Complete Independence  Assistive device utilized: None     Stand to sit: Complete Independence  Assistive device utilized: None     Chair to chair: Modified independence and SBA  Assistive device utilized: Environmental consultant - 4 wheeled        CURB:  Not tested  STAIRS: Findings: Level of Assistance: Modified independence, Stair Negotiation Technique: Step to Pattern with Single Rail on Right Bilateral Rails, Number of Stairs: 4 in front and 8 in the front, and Height of Stairs: 6     GAIT: Findings: Gait Characteristics: step to pattern, decreased stride length, shuffling, narrow BOS, poor foot clearance-  Right, and poor foot clearance- Left, Distance walked: in clinic distances, Assistive device utilized:Walker - 4 wheeled, and Level of assistance: Modified independence  FUNCTIONAL TESTS:  5 times sit to stand: 20s from chair  Timed up and go (TUG): 31s with rolling walker                                                                                                                               TREATMENT DATE:  12/15/23- EVAL    PATIENT EDUCATION: Education details: POC, HEP, fall risk, stroke education  Person educated: Patient and Child(ren) Education method: Explanation and  Demonstration Education comprehension: verbalized understanding and returned demonstration  HOME EXERCISE PROGRAM: Access Code: [email or web address] URL: [email or web address] Date: 12/15/2023 Prepared by: Donavon Fudge  Exercises - Standing March with Counter Support  - 1 x daily - 7 x weekly - 2 sets - 10 reps - Standing Hip Abduction with Counter Support  - 1 x daily - 7 x weekly - 2 sets - 10 reps - Heel Toe Raises with Counter Support  - 1 x daily - 7 x weekly - 2 sets - 10 reps - Sit to Stand  - 1 x daily - 7 x weekly - 2 sets - 10 reps  GOALS: Goals reviewed with patient? Yes  SHORT TERM GOALS: Target date: 01/26/24  Patient will be independent with initial HEP. Baseline: given 12/15/23 Goal status: INITIAL  2.  Patient will demonstrate decreased fall risk by scoring < 20 sec on TUG with LRAD. Baseline: 31s with rolling walker Goal status: INITIAL  3.  Patient will be educated on strategies to decrease risk of falls.  Baseline:  Goal status: INITIAL   LONG TERM GOALS: Target date: 03/08/24  Patient will be independent with advanced/ongoing HEP to improve outcomes and carryover.  Baseline:  Goal status: INITIAL  2.  Patient will be able to ambulate 500' with LRAD with good safety to access community.  Baseline: using rolling walker, poor foot clearance, shuffling  Goal status:  INITIAL  3.  Patient will demonstrate improved functional LE strength as demonstrated by 5xSTS <14s. Baseline: 20s Goal status: INITIAL  4.  Patient will demonstrate decreased fall risk by scoring < 15 sec on TUG with LRAD.  Baseline: 31s with rolling walker Goal status: INITIAL   ASSESSMENT:  CLINICAL IMPRESSION: Patient is a 83 y.o. male who was seen today for physical therapy evaluation and treatment for double vision and a stroke on 11/25/23. He is currently walking with a rolling walker and reports he uses his cane some in the house. He has a callus on his L foot; his daughter reports he gets them often and gets treatment for it. Patient reports once the callus is treated, he feels much better and can walk better. He does live on his own and mostly independent but presents as a fall risk and has decreased safety awareness. Patient will benefit from PT to address his gait and balance impairments to improve his functional independence and decrease his risk for falls and another stroke.   OBJECTIVE IMPAIRMENTS: Abnormal gait, decreased activity tolerance, decreased balance, decreased endurance, difficulty walking, decreased safety awareness, and improper body mechanics.   ACTIVITY LIMITATIONS: bending, standing, squatting, stairs, transfers, and locomotion level  PARTICIPATION LIMITATIONS: cleaning, community activity, and yard work  PERSONAL FACTORS: Age, Behavior pattern, Past/current experiences, and Transportation are also affecting patient's functional outcome.   REHAB POTENTIAL: Good  CLINICAL DECISION MAKING: Stable/uncomplicated  EVALUATION COMPLEXITY: Low  PLAN:  PT FREQUENCY: 2x/week  PT DURATION: 12 weeks  PLANNED INTERVENTIONS: 97110-Therapeutic exercises, 97530- Therapeutic activity, 97112- Neuromuscular re-education, 97535- Self Care, 01027- Manual therapy, [id number]- Gait training, Patient/Family education, Balance training, Stair training, DME instructions,  Cryotherapy, and Moist heat  PLAN FOR NEXT SESSION: gait training, balance, LE strengthening    Donavon Fudge, PT 12/15/2023, 3:36 PM

## 2023-12-22 ENCOUNTER — Ambulatory Visit (INDEPENDENT_AMBULATORY_CARE_PROVIDER_SITE_OTHER)

## 2023-12-22 ENCOUNTER — Ambulatory Visit: Admitting: Occupational Therapy

## 2023-12-22 ENCOUNTER — Ambulatory Visit

## 2023-12-22 ENCOUNTER — Encounter: Payer: Self-pay | Admitting: Occupational Therapy

## 2023-12-22 DIAGNOSIS — R41842 Visuospatial deficit: Secondary | ICD-10-CM

## 2023-12-22 DIAGNOSIS — I639 Cerebral infarction, unspecified: Secondary | ICD-10-CM

## 2023-12-22 DIAGNOSIS — R2689 Other abnormalities of gait and mobility: Secondary | ICD-10-CM

## 2023-12-22 DIAGNOSIS — R41844 Frontal lobe and executive function deficit: Secondary | ICD-10-CM

## 2023-12-22 DIAGNOSIS — R278 Other lack of coordination: Secondary | ICD-10-CM

## 2023-12-22 DIAGNOSIS — R41840 Attention and concentration deficit: Secondary | ICD-10-CM

## 2023-12-22 NOTE — Therapy (Signed)
 OUTPATIENT OCCUPATIONAL THERAPY NEURO Treatment  Patient Name: Adrian Rodgers MRN: [id number] DOB:May 04, 1941, 83 y.o., male Today's Date: 12/22/2023  PCP: Jon Berth NP REFERRING PROVIDER: Dr. Jonel  END OF SESSION:  OT End of Session - 12/22/23 1339     Visit Number 4    Number of Visits 17    Authorization Type [employer] Medicare    Authorization Time Period 16 visits authorized through 02/09/24    Authorization - Visit Number 4    Progress Note Due on Visit 10    OT Start Time 1328   pt late   OT Stop Time 1358    OT Time Calculation (min) 30 min            Past Medical History:  Diagnosis Date   Anemia    Aortic atherosclerosis (HCC)    Cerebral aneurysm    Community acquired pneumonia of right middle lobe of lung 09/20/2017   Emphysema of lung (HCC)    Esophagitis    Gastric ulcer    H. 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)    Stroke (HCC) 05/01/2020   Tobacco dependence    Past Surgical History:  Procedure Laterality Date   BIOPSY  04/10/2022   Procedure: BIOPSY;  Surgeon: Leigh Elspeth SQUIBB, MD;  Location: [facility] ENDOSCOPY;  Service: Gastroenterology;;   ESOPHAGOGASTRODUODENOSCOPY (EGD) WITH PROPOFOL  N/A 04/10/2022   Procedure: ESOPHAGOGASTRODUODENOSCOPY (EGD) WITH PROPOFOL ;  Surgeon: Leigh Elspeth SQUIBB, MD;  Location: [facility] ENDOSCOPY;  Service: Gastroenterology;  Laterality: N/A;   HEMOSTASIS CONTROL  04/10/2022   Procedure: HEMOSTASIS CONTROL;  Surgeon: Leigh Elspeth SQUIBB, MD;  Location: [facility] ENDOSCOPY;  Service: Gastroenterology;;  purastat    implantable loop recorder placement  06/05/2020   Medtronic Reveal [place] model [phone] G) implantable loop recorder    Patient Active Problem List   Diagnosis Date Noted   Blurry vision, bilateral 11/25/2023   Acute ischemic stroke (HCC) 11/25/2023   Paroxysmal atrial fibrillation (HCC) 06/24/2023    Hypercoagulable state due to paroxysmal atrial fibrillation (HCC) 06/24/2023   BPH (benign prostatic hyperplasia) 12/20/2022   GERD without esophagitis 12/20/2022   Sepsis due to pneumonia (HCC) 12/20/2022   LLL pneumonia 12/19/2022   GI bleed 04/11/2022   Upper GI bleed 04/10/2022   Acute gastric ulcer with hemorrhage 04/10/2022   Dark stools 04/09/2022   Heme positive stool 04/09/2022   Anemia 04/09/2022   Antiplatelet or antithrombotic long-term use 04/09/2022   Hypotension due to hypovolemia 04/08/2022   SIRS (systemic inflammatory response syndrome) (HCC) 04/08/2022   Acute encephalopathy 04/08/2022   Tobacco abuse 03/27/2022   Acute respiratory failure due to COVID-19 (HCC) 03/25/2022   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   Generalized weakness 03/27/2021   Ascending aortic aneurysm (HCC) 09/22/2020   COPD (chronic obstructive pulmonary disease) (HCC) 05/18/2020   Essential hypertension 05/13/2020   Dyslipidemia 05/13/2020   History of CVA (cerebrovascular accident) 05/12/2020    ONSET DATE: 11/25/23  REFERRING DIAG:  Diagnosis  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 CT negative. 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 PMH significant for hyperlipidemia, gastric ulcer, H. pylori, hypertension, CVA, PNA and tobacco abuse.    PRECAUTIONS: Fall  WEIGHT BEARING RESTRICTIONS: No  PAIN: PAIN:  Are you having pain? Yes: NPRS scale: 7/10 Pain location: left foot, knee Pain description: aching Aggravating factors: walking and weightbearing through it Relieving factors: rest    FALLS: Has patient fallen in last 6 months? No  LIVING ENVIRONMENT: Lives with: lives alone Lives in: House/apartment Stairs: No Has following equipment at home: Quad cane small base and grab bar a tub  PLOF: Independent  PATIENT GOALS: improve vision  OBJECTIVE:  Note: Objective measures were completed at Evaluation unless otherwise noted.  HAND DOMINANCE: Right  ADLs: Overall ADLs: mod I with all basic ADLS. Transfers/ambulation related to ADLs:mod I with walker   IADLs:  Light housekeeping: has not attempted yet Meal Prep: Pt's dtr has been preparing meals and he has been warming in the microwave. Community mobility: supervision with small based quad cane, Pt has walker and he practiced using in clinic with improved safety. Medication management: dtr fills pillbox, alexa reminds pt to take meds Financial management: Pt's dtr handles    MOBILITY STATUS: decreased balance uses quad cane or walker     FUNCTIONAL OUTCOME MEASURES: 19M number cancellation- 88% accuracy  UPPER EXTREMITY ROM:  WFLS    UPPER EXTREMITY MMT:   bilateral UE strength is grossly 4+/5    HAND FUNCTION: Grip strength: Right: 35 lbs; Left: 30 lbs  COORDINATION: 9 Hole Peg test: Right: 41.71 sec; Left: 35.66 sec  SENSATION: WFL      COGNITION: Overall cognitive status: Impaired, deceased short term memory, spells WORLD backwards   VISION: Subjective report: Pt reports double vision   VISION ASSESSMENT:  L eye does not track to right side Impaired vision Eye alignment: Impaired: 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 Pt was unsteady ambulating with quad cane. He has a walker at home, and he used the walker to walkt to the waiting room with better balance. Pt was encouraged to use his walker for safety.                                                                                                                            TREATMENT DATE:12/22/23- Pt arrived late 59M number cancellation task without partial occlusion  with 100% accuracy. copying picture Orthoptist with RUE for visual skills as well as fine motor coordiantion, min difficulty manipulating pieces with RUE, design was copied correctly with only 1 v.c. to double check Memory compensations were issued and reviewed. Pt verbalized understanding.   12/15/23 Pt was accompanied by his dtr today. Pt ambulated to therapy gym with rollator with close supervision and min v.c Pt was noted to drag feet to avoid weightbearing through LLE where he has a callous. Therapist reviewed pt's vision HEP wiht pt/ dtr, mod v.c for pt performance. 5-10 reps each exercise. Flipping and dealing playing cards with right and left UE's min-mod difficulty, mod v.c, increased time required due to lethargy. Pt was noted to fall asleep several times. Pt returned to waiting room ambulating via rollator with minguard for safety.   12/04/23-Pt arrived with walker and he ambulates more safely supervision-mod I with walker than quad cane. Pt arrived wearing clear glasses withnasal portion both eye occluded. Therapist worked with pt and was able to remove tape from nasal portion of right lens. Pt was instucted in  diplopia HEP, mod v.c and demonstration  5-10 reps each. Then beginning coordination HEP    12/01/23 eval only        PATIENT EDUCATION: Education details: memory compensations Person educated: Patient,  Education method: Explanation, handout Education comprehension: verbalized understanding,   HOME EXERCISE PROGRAM: 12/04/23- diplopia HEP, coordinationHEP- beginning 12/22/23- memory compensaitons  GOALS: Goals reviewed with patient? Yes  SHORT TERM GOALS: Target date: 12/31/23  I with inital HEP for vision.  Goal status:  ongoing, partially met, issued- pt and dtr instructed pt will need some assist from dtr.6/23//25  2.  I with compensatory strategies for visual deficits.  Goal status:   reviewed discussed 12/22/23- needs reinforcement  3.  Pt will perform tabletop scanning activities with 90% or better accuracy, using compensations prn.  Goal status:  ongoing 12/22/23, min v.c today  4.  Pt will navigate a busy environment, locating items with 75% accuracy without LOB   Goal status: ongoing 12/15/23  5.  I with memory compensations  Goal status: 12/22/23 issued, needs reinforcement    LONG TERM GOALS: Target date: 02/02/24  I with updated HEP  Goal status: INITIAL  2.  Pt will demonstrate improved RUE fine motor coordination as evidenced by decreasing 9 hole peg test score to 35 secs or less.  Goal status: INITIAL  3.  Pt will perform environmental scanning activities with 90% or better accuracy, using compensations prn,  without LOB  Goal status: INITIAL  4.  Pt will demonstrate ability to read a page of newsprint without partial occulsion and minimal reports of diplopia.  Goal status: INITIAL   ASSESSMENT:  CLINICAL IMPRESSION: Pt is progressing towards goals. Pt's diplopia is improving and he demonstrated good accuracy on a number cancellation task without partial occlusion. PERFORMANCE DEFICITS: in functional skills including ADLs, IADLs, coordination, dexterity, Fine motor control, mobility,  balance, endurance, decreased knowledge of precautions, vision, and UE functional use, cognitive skills including memory, problem solving, and safety awareness, and psychosocial skills including coping strategies, environmental adaptation, habits, interpersonal interactions, and routines and behaviors.   IMPAIRMENTS: are limiting patient from ADLs, IADLs, play, leisure, and social participation.   CO-MORBIDITIES: may have co-morbidities  that affects occupational performance. Patient will benefit from skilled OT to address above impairments and improve overall function.  MODIFICATION OR ASSISTANCE TO COMPLETE EVALUATION: Min-Moderate modification of tasks or assist with assess necessary to complete  an evaluation.  OT OCCUPATIONAL PROFILE AND HISTORY: Detailed assessment: Review of records and additional review of physical, cognitive, psychosocial history related to current functional performance.  CLINICAL DECISION MAKING: LOW - limited treatment options, no task modification necessary  REHAB POTENTIAL: Good  EVALUATION COMPLEXITY: Low    PLAN:  OT FREQUENCY: 16 visits  OT DURATION: 10 weeks  PLANNED INTERVENTIONS: 97168 OT Re-evaluation, 97535 self care/ADL training, 02889 therapeutic exercise, 97530 therapeutic activity, 97112 neuromuscular re-education, 97140 manual therapy, 97116 gait training, 02964 ultrasound, 97018 paraffin, 02989 moist heat, 97010 cryotherapy, 97129 Cognitive training (first 15 min), 02869 Cognitive training(each additional 15 min), passive range of motion, balance training, functional mobility training, visual/perceptual remediation/compensation, energy conservation, coping strategies training, patient/family education, and DME and/or AE instructions  RECOMMENDED OTHER SERVICES: PT  CONSULTED AND AGREED WITH PLAN OF CARE: Patient and family member/caregiver  PLAN FOR NEXT SESSION:  environmental and tabletop scanning.   Naturi Alarid, OT 12/22/2023, 1:49  PM

## 2023-12-22 NOTE — Patient Instructions (Signed)

## 2023-12-22 NOTE — Therapy (Signed)
 OUTPATIENT PHYSICAL THERAPY NEURO TREATMENT   Patient Name: Adrian Rodgers MRN: [id number] DOB:1940/07/10, 83 y.o., male Today's Date: 12/22/2023   PCP: Jon Berth REFERRING PROVIDER: Lonni Dalton  END OF SESSION:  PT End of Session - 12/22/23 1354     Visit Number 2    Date for PT Re-Evaluation 03/08/24    Authorization Type UHC    PT Start Time 1400    PT Stop Time 1445    PT Time Calculation (min) 45 min           Past Medical History:  Diagnosis Date   Anemia    Aortic atherosclerosis (HCC)    Cerebral aneurysm    Community acquired pneumonia of right middle lobe of lung 09/20/2017   Emphysema of lung (HCC)    Esophagitis    Gastric ulcer    H. 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)    Stroke (HCC) 05/01/2020   Tobacco dependence    Past Surgical History:  Procedure Laterality Date   BIOPSY  04/10/2022   Procedure: BIOPSY;  Surgeon: Leigh Elspeth SQUIBB, MD;  Location: [facility] ENDOSCOPY;  Service: Gastroenterology;;   ESOPHAGOGASTRODUODENOSCOPY (EGD) WITH PROPOFOL  N/A 04/10/2022   Procedure: ESOPHAGOGASTRODUODENOSCOPY (EGD) WITH PROPOFOL ;  Surgeon: Leigh Elspeth SQUIBB, MD;  Location: [facility] ENDOSCOPY;  Service: Gastroenterology;  Laterality: N/A;   HEMOSTASIS CONTROL  04/10/2022   Procedure: HEMOSTASIS CONTROL;  Surgeon: Leigh Elspeth SQUIBB, MD;  Location: [facility] ENDOSCOPY;  Service: Gastroenterology;;  purastat    implantable loop recorder placement  06/05/2020   Medtronic Reveal [place] model [id number] [phone] G) implantable loop recorder    Patient Active Problem List   Diagnosis Date Noted   Blurry vision, bilateral 11/25/2023   Acute ischemic stroke (HCC) 11/25/2023   Paroxysmal atrial fibrillation (HCC) 06/24/2023   Hypercoagulable state due to paroxysmal atrial fibrillation (HCC) 06/24/2023   BPH (benign prostatic hyperplasia) 12/20/2022   GERD without  esophagitis 12/20/2022   Sepsis due to pneumonia (HCC) 12/20/2022   LLL pneumonia 12/19/2022   GI bleed 04/11/2022   Upper GI bleed 04/10/2022   Acute gastric ulcer with hemorrhage 04/10/2022   Dark stools 04/09/2022   Heme positive stool 04/09/2022   Anemia 04/09/2022   Antiplatelet or antithrombotic long-term use 04/09/2022   Hypotension due to hypovolemia 04/08/2022   SIRS (systemic inflammatory response syndrome) (HCC) 04/08/2022   Acute encephalopathy 04/08/2022   Tobacco abuse 03/27/2022   Acute respiratory failure due to COVID-19 (HCC) 03/25/2022   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   Generalized weakness 03/27/2021   Ascending aortic aneurysm (HCC) 09/22/2020   COPD (chronic obstructive pulmonary disease) (HCC) 05/18/2020   Essential hypertension 05/13/2020   Dyslipidemia 05/13/2020   History of CVA (cerebrovascular accident) 05/12/2020    ONSET DATE: 11/25/23  REFERRING DIAG:  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 I gotta get the callus on my foot fixed on the 7th.    Pt accompanied by: daughter  PERTINENT HISTORY:  11/25/23 Adrian Rodgers is a 83 y.o. male with medical history significant of hypertension, hyperlipidemia, prior CVAs without residual deficit, paroxysmal atrial fibrillation on chronic anticoagulation, cerebral aneurysm, and COPD presents with double vision  since yesterday. 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   PAIN:  Are you having pain? L foot because I have a callus   PRECAUTIONS: Fall  RED FLAGS: None   WEIGHT BEARING RESTRICTIONS: No  FALLS: Has patient fallen in last 6 months? No  LIVING ENVIRONMENT: Lives with: lives alone Lives in: House/apartment Stairs: Yes: External: 4 in front, 8 in the back steps; on right going up and can reach both Has following equipment at home: Single point cane, Quad cane small base, Walker - 2 wheeled, Environmental consultant - 4 wheeled, Wheelchair (manual), and Grab bars  PLOF: Independent with basic ADLs and Independent with household mobility without device  PATIENT GOALS: walk without assistive device, be able to pass your tests   OBJECTIVE:  Note: Objective measures were completed at Evaluation unless otherwise noted.  DIAGNOSTIC FINDINGS: MRI brain showed small pontine stroke, correlates well to INO.  Dilated by neurology, suspected small vessel disease due to smoking, hypercholesterolemia. - CTA head and neck unremarkable, echocardiogram with PFO, likely unrelated  COGNITION: Overall cognitive status: Within functional limits for tasks assessed   SENSATION: WFL   POSTURE: rounded shoulders and forward head  LOWER EXTREMITY ROM:  WFL    LOWER EXTREMITY MMT:  grossly 4+/5    BED MOBILITY:  Not tested  TRANSFERS: Sit to stand: Complete Independence  Assistive device utilized: None     Stand to sit: Complete Independence  Assistive device utilized: None     Chair to chair: Modified independence and SBA  Assistive device utilized: Environmental consultant - 4 wheeled        CURB:  Not tested  STAIRS: Findings: Level of Assistance: Modified independence, Stair Negotiation Technique: Step to Pattern with Single Rail on Right Bilateral Rails, Number of Stairs: 4 in front and 8 in the front, and Height of Stairs: 6     GAIT: Findings: Gait Characteristics: step to pattern, decreased  stride length, shuffling, narrow BOS, poor foot clearance- Right, and poor foot clearance- Left, Distance walked: in clinic distances, Assistive device utilized:Walker - 4 wheeled, and Level of assistance: Modified independence  FUNCTIONAL TESTS:  5 times sit to stand: 20s from chair  Timed up and go (TUG): 31s with rolling walker                                                                                                                               TREATMENT DATE:  12/22/23 NuStep L5x66mins  LAQ 2# 2x10 2# marching with walker  HS curls green 2x10 STS 2x10 Seated heel to toe raises 2x10 Step taps 4 Step ups 4  12/15/23- EVAL    PATIENT EDUCATION: Education details: POC, HEP, fall risk, stroke education  Person educated: Patient and Child(ren) Education method: Explanation and Demonstration Education comprehension: verbalized understanding and returned demonstration  HOME EXERCISE PROGRAM: Access Code: [email or web address] URL: [email or web address] Date: 12/15/2023 Prepared by: Almetta Fam  Exercises - Standing March with Counter Support  - 1 x daily - 7 x weekly - 2 sets - 10 reps - Standing Hip Abduction with Counter Support  - 1 x daily - 7 x weekly - 2 sets - 10 reps - Heel Toe Raises with Counter Support  - 1 x daily - 7 x weekly - 2 sets - 10 reps - Sit to Stand  - 1 x daily - 7 x weekly - 2 sets - 10 reps  GOALS: Goals reviewed with patient? Yes  SHORT TERM GOALS: Target date: 01/26/24  Patient will be independent with initial HEP. Baseline: given 12/15/23 Goal status: INITIAL  2.  Patient will demonstrate decreased fall risk by scoring < 20 sec on TUG with LRAD. Baseline: 31s with rolling walker Goal status: INITIAL  3.  Patient will be educated on strategies to decrease risk of falls.  Baseline:  Goal status: INITIAL   LONG TERM GOALS: Target date: 03/08/24  Patient will be independent with advanced/ongoing HEP to improve outcomes and carryover.   Baseline:  Goal status: INITIAL  2.  Patient will be able to ambulate 500' with LRAD with good safety to access community.  Baseline: using rolling walker, poor foot clearance, shuffling  Goal status: INITIAL  3.  Patient will demonstrate improved functional LE strength as demonstrated by 5xSTS <14s. Baseline: 20s Goal status: INITIAL  4.  Patient will demonstrate decreased fall risk by scoring < 15 sec on TUG with LRAD.  Baseline: 31s with rolling walker Goal status: INITIAL   ASSESSMENT:  CLINICAL IMPRESSION: Patient returns with no changes. Cues needed for him to stay close to his walker and clear his feet. We focused on some light strengthening and functional tasks today. Requires handrail assist with step taps and step ups on 4. Continue to improve his balance and strength to increase independence with safety.   Patient is a 83 y.o. male who was seen today for physical therapy evaluation and treatment for double vision and a stroke on 11/25/23. He is currently walking with a rolling walker and reports he uses his cane some in the house. He has a callus on his L foot; his daughter reports he gets them often and gets treatment for it. Patient reports once the callus is treated, he feels much better and can walk better. He does live on his own and mostly independent but presents as a fall risk and has decreased safety awareness. Patient will benefit from PT to address his gait and balance impairments to improve his functional independence and decrease his risk for falls and another stroke.   OBJECTIVE IMPAIRMENTS: Abnormal gait, decreased activity tolerance, decreased balance, decreased endurance, difficulty walking, decreased safety awareness, and improper body mechanics.   ACTIVITY LIMITATIONS: bending, standing, squatting, stairs, transfers, and locomotion level  PARTICIPATION LIMITATIONS: cleaning, community activity, and yard work  PERSONAL FACTORS: Age, Behavior pattern,  Past/current experiences, and Transportation are also affecting patient's functional outcome.   REHAB POTENTIAL: Good  CLINICAL DECISION MAKING: Stable/uncomplicated  EVALUATION COMPLEXITY: Low  PLAN:  PT FREQUENCY: 2x/week  PT DURATION: 12  weeks  PLANNED INTERVENTIONS: 97110-Therapeutic exercises, 97530- Therapeutic activity, [id number]- Neuromuscular re-education, 97535- Self Care, 02859- Manual therapy, [phone]- Gait training, Patient/Family education, Balance training, Stair training, DME instructions, Cryotherapy, and Moist heat  PLAN FOR NEXT SESSION: gait training, balance, LE strengthening    Almetta Fam, PT 12/22/2023, 2:42 PM

## 2023-12-23 LAB — CUP PACEART REMOTE DEVICE CHECK
Date Time Interrogation Session: 20250622230216
Implantable Pulse Generator Implant Date: 20211206

## 2023-12-24 ENCOUNTER — Ambulatory Visit: Payer: Self-pay | Admitting: Cardiology

## 2023-12-29 ENCOUNTER — Ambulatory Visit: Payer: Medicare Other

## 2023-12-29 ENCOUNTER — Ambulatory Visit

## 2023-12-29 ENCOUNTER — Encounter: Payer: Self-pay | Admitting: Occupational Therapy

## 2023-12-29 ENCOUNTER — Ambulatory Visit: Admitting: Occupational Therapy

## 2023-12-29 DIAGNOSIS — R41844 Frontal lobe and executive function deficit: Secondary | ICD-10-CM

## 2023-12-29 DIAGNOSIS — R41842 Visuospatial deficit: Secondary | ICD-10-CM

## 2023-12-29 DIAGNOSIS — R278 Other lack of coordination: Secondary | ICD-10-CM

## 2023-12-29 DIAGNOSIS — I639 Cerebral infarction, unspecified: Secondary | ICD-10-CM

## 2023-12-29 DIAGNOSIS — R2689 Other abnormalities of gait and mobility: Secondary | ICD-10-CM

## 2023-12-29 DIAGNOSIS — H532 Diplopia: Secondary | ICD-10-CM

## 2023-12-29 DIAGNOSIS — R41840 Attention and concentration deficit: Secondary | ICD-10-CM

## 2023-12-29 NOTE — Therapy (Signed)
 OUTPATIENT PHYSICAL THERAPY NEURO TREATMENT   Patient Name: Adrian Rodgers MRN: [id number] DOB:May 16, 1941, 83 y.o., male Today's Date: 12/29/2023   PCP: Jon Berth REFERRING PROVIDER: Lonni Dalton  END OF SESSION:  PT End of Session - 12/29/23 1454     Visit Number 3    Date for PT Re-Evaluation 03/08/24    Authorization Type UHC    PT Start Time 1452    PT Stop Time 1530    PT Time Calculation (min) 38 min            Past Medical History:  Diagnosis Date   Anemia    Aortic atherosclerosis (HCC)    Cerebral aneurysm    Community acquired pneumonia of right middle lobe of lung 09/20/2017   Emphysema of lung (HCC)    Esophagitis    Gastric ulcer    H. 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)    Stroke (HCC) 05/01/2020   Tobacco dependence    Past Surgical History:  Procedure Laterality Date   BIOPSY  04/10/2022   Procedure: BIOPSY;  Surgeon: Leigh Elspeth SQUIBB, MD;  Location: [facility] ENDOSCOPY;  Service: Gastroenterology;;   ESOPHAGOGASTRODUODENOSCOPY (EGD) WITH PROPOFOL  N/A 04/10/2022   Procedure: ESOPHAGOGASTRODUODENOSCOPY (EGD) WITH PROPOFOL ;  Surgeon: Leigh Elspeth SQUIBB, MD;  Location: [facility] ENDOSCOPY;  Service: Gastroenterology;  Laterality: N/A;   HEMOSTASIS CONTROL  04/10/2022   Procedure: HEMOSTASIS CONTROL;  Surgeon: Leigh Elspeth SQUIBB, MD;  Location: [facility] ENDOSCOPY;  Service: Gastroenterology;;  purastat    implantable loop recorder placement  06/05/2020   Medtronic Reveal [place] model [id number]) implantable loop recorder    Patient Active Problem List   Diagnosis Date Noted   Blurry vision, bilateral 11/25/2023   Acute ischemic stroke (HCC) 11/25/2023   Paroxysmal atrial fibrillation (HCC) 06/24/2023   Hypercoagulable state due to paroxysmal atrial fibrillation (HCC) 06/24/2023   BPH (benign prostatic hyperplasia) 12/20/2022   GERD without  esophagitis 12/20/2022   Sepsis due to pneumonia (HCC) 12/20/2022   LLL pneumonia 12/19/2022   GI bleed 04/11/2022   Upper GI bleed 04/10/2022   Acute gastric ulcer with hemorrhage 04/10/2022   Dark stools 04/09/2022   Heme positive stool 04/09/2022   Anemia 04/09/2022   Antiplatelet or antithrombotic long-term use 04/09/2022   Hypotension due to hypovolemia 04/08/2022   SIRS (systemic inflammatory response syndrome) (HCC) 04/08/2022   Acute encephalopathy 04/08/2022   Tobacco abuse 03/27/2022   Acute respiratory failure due to COVID-19 (HCC) 03/25/2022   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   Generalized weakness 03/27/2021   Ascending aortic aneurysm (HCC) 09/22/2020   COPD (chronic obstructive pulmonary disease) (HCC) 05/18/2020   Essential hypertension 05/13/2020   Dyslipidemia 05/13/2020   History of CVA (cerebrovascular accident) 05/12/2020    ONSET DATE: 11/25/23  REFERRING DIAG:  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  I63.9 (ICD-10-CM) - Acute ischemic stroke (HCC)    THERAPY DIAG:  Other lack of coordination  Other abnormalities of gait and mobility  Visuospatial deficit  Cerebrovascular accident (CVA), unspecified mechanism (HCC)  Diplopia  Rationale for Evaluation and Treatment: Rehabilitation  SUBJECTIVE:  SUBJECTIVE STATEMENT: I am walking with the cane today, but mostly been using it. The callus on my foot supposed to get fixed on the 7th.    Pt accompanied by: daughter  PERTINENT HISTORY:  11/25/23 Adrian Rodgers is a 83 y.o. male with medical history significant of hypertension, hyperlipidemia, prior CVAs without residual deficit, paroxysmal atrial fibrillation on chronic anticoagulation, cerebral aneurysm, and COPD presents with double vision since  yesterday. 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   PAIN:  Are you having pain? L foot because I have a callus   PRECAUTIONS: Fall  RED FLAGS: None   WEIGHT BEARING RESTRICTIONS: No  FALLS: Has patient fallen in last 6 months? No  LIVING ENVIRONMENT: Lives with: lives alone Lives in: House/apartment Stairs: Yes: External: 4 in front, 8 in the back steps; on right going up and can reach both Has following equipment at home: Single point cane, Quad cane small base, Walker - 2 wheeled, Environmental consultant - 4 wheeled, Wheelchair (manual), and Grab bars  PLOF: Independent with basic ADLs and Independent with household mobility without device  PATIENT GOALS: walk without assistive device, be able to pass your tests   OBJECTIVE:  Note: Objective measures were completed at Evaluation unless otherwise noted.  DIAGNOSTIC FINDINGS: MRI brain showed small pontine stroke, correlates well to INO.  Dilated by neurology, suspected small vessel disease due to smoking, hypercholesterolemia. - CTA head and neck unremarkable, echocardiogram with PFO, likely unrelated  COGNITION: Overall cognitive status: Within functional limits for tasks assessed   SENSATION: WFL   POSTURE: rounded shoulders and forward head  LOWER EXTREMITY ROM:  WFL    LOWER EXTREMITY MMT:  grossly 4+/5    BED MOBILITY:  Not tested  TRANSFERS: Sit to stand: Complete Independence  Assistive device utilized: None     Stand to sit: Complete Independence  Assistive device utilized: None     Chair to chair: Modified independence and SBA  Assistive device utilized: Environmental consultant - 4 wheeled        CURB:  Not tested  STAIRS: Findings: Level of Assistance: Modified independence, Stair Negotiation Technique: Step to Pattern with Single Rail on Right Bilateral Rails, Number of Stairs: 4 in front and 8 in the front, and Height of Stairs: 6     GAIT: Findings: Gait Characteristics: step to pattern, decreased stride  length, shuffling, narrow BOS, poor foot clearance- Right, and poor foot clearance- Left, Distance walked: in clinic distances, Assistive device utilized:Walker - 4 wheeled, and Level of assistance: Modified independence  FUNCTIONAL TESTS:  5 times sit to stand: 20s from chair  Timed up and go (TUG): 31s with rolling walker                                                                                                                               TREATMENT DATE:  12/29/23 NuStep L5x62mins  Seated hip abd green 2x10 Ball squeeze 2x10 STS with OHP 2x10  2# marching, hip abd, hip ext, HS curls standing in bars 2x10 Side steps on beam TUG- 24s w/quad cane  12/22/23 NuStep L5x50mins  LAQ 2# 2x10 2# marching with walker  HS curls green 2x10 STS 2x10 Seated heel to toe raises 2x10 Step taps 4 Step ups 4  12/15/23- EVAL    PATIENT EDUCATION: Education details: POC, HEP, fall risk, stroke education  Person educated: Patient and Child(ren) Education method: Explanation and Demonstration Education comprehension: verbalized understanding and returned demonstration  HOME EXERCISE PROGRAM: Access Code: [email or web address] URL: [email or web address] Date: 12/15/2023 Prepared by: Almetta Fam  Exercises - Standing March with Counter Support  - 1 x daily - 7 x weekly - 2 sets - 10 reps - Standing Hip Abduction with Counter Support  - 1 x daily - 7 x weekly - 2 sets - 10 reps - Heel Toe Raises with Counter Support  - 1 x daily - 7 x weekly - 2 sets - 10 reps - Sit to Stand  - 1 x daily - 7 x weekly - 2 sets - 10 reps  GOALS: Goals reviewed with patient? Yes  SHORT TERM GOALS: Target date: 01/26/24  Patient will be independent with initial HEP. Baseline: given 12/15/23 Goal status: INITIAL  2.  Patient will demonstrate decreased fall risk by scoring < 20 sec on TUG with LRAD. Baseline: 31s with rolling walker Goal status: IN PROGRESS 24s with quad cane 12/29/23  3.  Patient will  be educated on strategies to decrease risk of falls.  Baseline:  Goal status: INITIAL   LONG TERM GOALS: Target date: 03/08/24  Patient will be independent with advanced/ongoing HEP to improve outcomes and carryover.  Baseline:  Goal status: INITIAL  2.  Patient will be able to ambulate 500' with LRAD with good safety to access community.  Baseline: using rolling walker, poor foot clearance, shuffling  Goal status: INITIAL  3.  Patient will demonstrate improved functional LE strength as demonstrated by 5xSTS <14s. Baseline: 20s Goal status: INITIAL  4.  Patient will demonstrate decreased fall risk by scoring < 15 sec on TUG with LRAD.  Baseline: 31s with rolling walker Goal status: INITIAL   ASSESSMENT:  CLINICAL IMPRESSION: Patient returns walking with quad cane today. Still requires cues to clear his feet, he reports the callus on his L foot is messing up his gait. We continued to work on some strengthening in both standing and sitting. Also did some balance on the beam, which he is unable to do without support. Patient has made some improvement with TUG speed using LRAD. Continue to improve his balance and strength to increase independence with safety.   Patient is a 83 y.o. male who was seen today for physical therapy evaluation and treatment for double vision and a stroke on 11/25/23. He is currently walking with a rolling walker and reports he uses his cane some in the house. He has a callus on his L foot; his daughter reports he gets them often and gets treatment for it. Patient reports once the callus is treated, he feels much better and can walk better. He does live on his own and mostly independent but presents as a fall risk and has decreased safety awareness. Patient will benefit from PT to address his gait and balance impairments to improve his functional independence and decrease his risk for falls and another stroke.   OBJECTIVE IMPAIRMENTS: Abnormal gait, decreased  activity tolerance, decreased balance, decreased endurance, difficulty walking, decreased safety awareness, and improper  body mechanics.   ACTIVITY LIMITATIONS: bending, standing, squatting, stairs, transfers, and locomotion level  PARTICIPATION LIMITATIONS: cleaning, community activity, and yard work  PERSONAL FACTORS: Age, Behavior pattern, Past/current experiences, and Transportation are also affecting patient's functional outcome.   REHAB POTENTIAL: Good  CLINICAL DECISION MAKING: Stable/uncomplicated  EVALUATION COMPLEXITY: Low  PLAN:  PT FREQUENCY: 2x/week  PT DURATION: 12 weeks  PLANNED INTERVENTIONS: 97110-Therapeutic exercises, 97530- Therapeutic activity, 97112- Neuromuscular re-education, 97535- Self Care, 02859- Manual therapy, [id number]- Gait training, Patient/Family education, Balance training, Stair training, DME instructions, Cryotherapy, and Moist heat  PLAN FOR NEXT SESSION: gait training, balance, LE strengthening    Almetta Fam, PT 12/29/2023, 3:32 PM

## 2023-12-29 NOTE — Therapy (Signed)
 OUTPATIENT OCCUPATIONAL THERAPY NEURO Treatment  Patient Name: Adrian Rodgers MRN: [id number] DOB:02-05-1941, 83 y.o., male Today's Date: 12/29/2023  PCP: Jon Berth NP REFERRING PROVIDER: Dr. Jonel  END OF SESSION:  OT End of Session - 12/29/23 1538     Visit Number 5    Number of Visits 17    Date for OT Re-Evaluation 02/16/24    Authorization Type U[employer] Medicare    Authorization Time Period 5    Authorization - Visit Number 5    Progress Note Due on Visit 10    OT Start Time 1537    OT Stop Time 1615    OT Time Calculation (min) 38 min             Past Medical History:  Diagnosis Date   Anemia    Aortic atherosclerosis (HCC)    Cerebral aneurysm    Community acquired pneumonia of right middle lobe of lung 09/20/2017   Emphysema of lung (HCC)    Esophagitis    Gastric ulcer    H. 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)    Stroke (HCC) 05/01/2020   Tobacco dependence    Past Surgical History:  Procedure Laterality Date   BIOPSY  04/10/2022   Procedure: BIOPSY;  Surgeon: Leigh Elspeth SQUIBB, MD;  Location: [facility] ENDOSCOPY;  Service: Gastroenterology;;   ESOPHAGOGASTRODUODENOSCOPY (EGD) WITH PROPOFOL  N/A 04/10/2022   Procedure: ESOPHAGOGASTRODUODENOSCOPY (EGD) WITH PROPOFOL ;  Surgeon: Leigh Elspeth SQUIBB, MD;  Location: [facility] ENDOSCOPY;  Service: Gastroenterology;  Laterality: N/A;   HEMOSTASIS CONTROL  04/10/2022   Procedure: HEMOSTASIS CONTROL;  Surgeon: Leigh Elspeth SQUIBB, MD;  Location: [facility] ENDOSCOPY;  Service: Gastroenterology;;  purastat    implantable loop recorder placement  06/05/2020   Medtronic Reveal [place] model [id number]) implantable loop recorder    Patient Active Problem List   Diagnosis Date Noted   Blurry vision, bilateral 11/25/2023   Acute ischemic stroke (HCC) 11/25/2023   Paroxysmal atrial fibrillation (HCC) 06/24/2023    Hypercoagulable state due to paroxysmal atrial fibrillation (HCC) 06/24/2023   BPH (benign prostatic hyperplasia) 12/20/2022   GERD without esophagitis 12/20/2022   Sepsis due to pneumonia (HCC) 12/20/2022   LLL pneumonia 12/19/2022   GI bleed 04/11/2022   Upper GI bleed 04/10/2022   Acute gastric ulcer with hemorrhage 04/10/2022   Dark stools 04/09/2022   Heme positive stool 04/09/2022   Anemia 04/09/2022   Antiplatelet or antithrombotic long-term use 04/09/2022   Hypotension due to hypovolemia 04/08/2022   SIRS (systemic inflammatory response syndrome) (HCC) 04/08/2022   Acute encephalopathy 04/08/2022   Tobacco abuse 03/27/2022   Acute respiratory failure due to COVID-19 (HCC) 03/25/2022   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   Generalized weakness 03/27/2021   Ascending aortic aneurysm (HCC) 09/22/2020   COPD (chronic obstructive pulmonary disease) (HCC) 05/18/2020   Essential hypertension 05/13/2020   Dyslipidemia 05/13/2020   History of CVA (cerebrovascular accident) 05/12/2020    ONSET DATE: 11/25/23  REFERRING DIAG:  Diagnosis  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 CT negative. 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 PMH significant for hyperlipidemia, gastric ulcer, H. pylori, hypertension, CVA, PNA and tobacco  abuse.   PRECAUTIONS: Fall  WEIGHT BEARING RESTRICTIONS: No  PAIN: PAIN:  Are you having pain? Yes: NPRS scale: 7/10 Pain location: left foot, knee Pain description: aching Aggravating factors: walking and weightbearing through it Relieving factors: rest    FALLS: Has patient fallen in last 6 months? No  LIVING ENVIRONMENT: Lives with: lives alone Lives in: House/apartment Stairs: No Has following equipment at home: Quad cane small base and grab bar a tub  PLOF: Independent  PATIENT GOALS: improve vision  OBJECTIVE:  Note: Objective measures were completed at Evaluation unless otherwise noted.  HAND DOMINANCE: Right  ADLs: Overall ADLs: mod I with all basic ADLS. Transfers/ambulation related to ADLs:mod I with walker   IADLs:  Light housekeeping: has not attempted yet Meal Prep: Pt's dtr has been preparing meals and he has been warming in the microwave. Community mobility: supervision with small based quad cane, Pt has walker and he practiced using in clinic with improved safety. Medication management: dtr fills pillbox, alexa reminds pt to take meds Financial management: Pt's dtr handles    MOBILITY STATUS: decreased balance uses quad cane or walker     FUNCTIONAL OUTCOME MEASURES: 34M number cancellation- 88% accuracy  UPPER EXTREMITY ROM:  WFLS    UPPER EXTREMITY MMT:   bilateral UE strength is grossly 4+/5    HAND FUNCTION: Grip strength: Right: 35 lbs; Left: 30 lbs  COORDINATION: 9 Hole Peg test: Right: 41.71 sec; Left: 35.66 sec  SENSATION: WFL      COGNITION: Overall cognitive status: Impaired, deceased short term memory, spells WORLD backwards   VISION: Subjective report: Pt reports double vision   VISION ASSESSMENT:  L eye does not track to right side Impaired vision Eye alignment: Impaired: 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 Pt was unsteady ambulating with quad cane. He has a walker at home, and he used the walker to walkt to the waiting room with better balance. Pt was encouraged to use his walker for safety.                                                                                                                            TREATMENT DATE:12/29/23 Reviewed vision HEP, 5 reps each,  eyes individually then together min v.c  Pt with improved performance today and less diplopia. Finding same symbols level 2 on constant therapy, 99% accuracy Placing grooved pegs into pegboard with RUE for increased fine motor coordination, min difficulty/ v.c  Removing pegs with in hand manipulation, mod difficulty/ drops Pt demonstrates difficulty with in hand manipulation. completing a 12 piece puzzle for cognitve and visual skills, mod v.c and increased time required. RUE 9 hole peg test: 26.70 12/22/23- Pt arrived late 43M number cancellation task without partial occlusion with 100% accuracy. copying picture Orthoptist with RUE for visual skills as well as fine motor coordination, min difficulty manipulating pieces with RUE, design was copied correctly with only 1 v.c. to double check Memory compensations were issued and reviewed. Pt verbalized understanding.   12/15/23 Pt was accompanied by his dtr today. Pt ambulated to therapy gym with rollator with close supervision and min v.c Pt was noted to drag feet to avoid weightbearing through LLE where he has a callous. Therapist reviewed pt's vision HEP with pt/ dtr, mod v.c for pt performance. 5-10 reps each exercise. Flipping and dealing playing  cards with right and left UE's min-mod difficulty, mod v.c, increased time required due to lethargy. Pt was noted to fall asleep several times. Pt returned to waiting room ambulating via rollator with minguard for safety.   12/04/23-Pt arrived with walker and he ambulates more safely supervision-mod I with walker than quad cane. Pt arrived wearing clear glasses withnasal portion both eye occluded. Therapist worked with pt and was able to remove tape from nasal portion of right lens. Pt was instucted in  diplopia HEP, mod v.c and demonstration 5-10 reps each. Then beginning coordination HEP    12/01/23 eval only        PATIENT EDUCATION: Education details: memory compensations Person educated: Patient,  Education method: Explanation, handout Education comprehension: verbalized understanding,   HOME EXERCISE PROGRAM: 12/04/23- diplopia HEP, coordinationHEP- beginning 12/22/23- memory compensaitons  GOALS: Goals reviewed with patient? Yes  SHORT TERM GOALS: Target date: 12/31/23  I with inital HEP for vision.  Goal status:  ongoing, partially met, issued- pt and dtr instructed pt will need some assist from dtr.6/23//25  2.  I with compensatory strategies for visual deficits.  Goal status:   reviewed discussed 12/22/23- needs reinforcement  3.  Pt will perform tabletop scanning activities with 90% or better accuracy, using compensations prn.  Goal status:  ongoing 12/22/23, min v.c today  4.  Pt will navigate a busy environment, locating items with 75% accuracy without LOB   Goal status: ongoing 12/15/23  5.  I with memory compensations  Goal status: 12/22/23 issued, needs reinforcement    LONG TERM GOALS: Target date: 02/02/24  I with updated HEP  Goal status:met 12/29/23- pt returned demonstration of vision HEP  2.  Pt will demonstrate improved RUE fine motor coordination as evidenced by decreasing 9 hole peg test score to 35 secs or less.  Goal status: 26.70, met,  12/29/23- however pt with difficulty with in hand manipulation  3.  Pt will perform environmental scanning activities with 90% or better accuracy, using compensations prn,  without LOB  Goal status: ongoing 12/29/23  4.  Pt will demonstrate ability to read a page of newsprint without partial occulsion and minimal reports of diplopia.  Goal status: ongoing pt did not bring reading glasses today 12/29/23   ASSESSMENT:  CLINICAL IMPRESSION: Pt is progressing towards goals. Pt's diplopia is improving and he demonstrated ability  to perform vision HEP. PERFORMANCE DEFICITS: in functional skills including ADLs, IADLs, coordination, dexterity, Fine motor control, mobility, balance, endurance, decreased knowledge of precautions, vision, and UE functional use, cognitive skills including memory, problem solving, and safety awareness, and psychosocial skills including coping strategies, environmental adaptation, habits, interpersonal interactions, and routines and behaviors.   IMPAIRMENTS: are limiting patient from ADLs, IADLs, play, leisure, and social participation.   CO-MORBIDITIES: may have co-morbidities  that affects occupational performance. Patient will benefit from skilled OT to address above impairments and improve overall function.  MODIFICATION OR ASSISTANCE TO COMPLETE EVALUATION: Min-Moderate modification of tasks or assist with assess necessary to complete an evaluation.  OT OCCUPATIONAL PROFILE AND HISTORY: Detailed assessment: Review of records and additional review of physical, cognitive, psychosocial history related to current functional performance.  CLINICAL DECISION MAKING: LOW - limited treatment options, no task modification necessary  REHAB POTENTIAL: Good  EVALUATION COMPLEXITY: Low    PLAN:  OT FREQUENCY: 16 visits  OT DURATION: 10 weeks  PLANNED INTERVENTIONS: 97168 OT Re-evaluation, 97535 self care/ADL training, 02889 therapeutic exercise, 97530 therapeutic  activity, 97112 neuromuscular re-education, 97140 manual therapy, 97116 gait training, 02964 ultrasound, 97018 paraffin, 02989 moist heat, 97010 cryotherapy, 97129 Cognitive training (first 15 min), 02869 Cognitive training(each additional 15 min), passive range of motion, balance training, functional mobility training, visual/perceptual remediation/compensation, energy conservation, coping strategies training, patient/family education, and DME and/or AE instructions  RECOMMENDED OTHER SERVICES: PT  CONSULTED AND AGREED WITH PLAN OF CARE: Patient and family member/caregiver  PLAN FOR NEXT SESSION:  environmental scanning, copying small peg design   Donnella Morford, OT 12/29/2023, 3:39 PM

## 2023-12-31 ENCOUNTER — Ambulatory Visit

## 2023-12-31 ENCOUNTER — Ambulatory Visit: Admitting: Occupational Therapy

## 2023-12-31 NOTE — Therapy (Incomplete)
 OUTPATIENT PHYSICAL THERAPY NEURO TREATMENT   Patient Name: Adrian Rodgers MRN: [id number] DOB:1940-11-27, 83 y.o., male Today's Date: 12/31/2023   PCP: Jon Berth REFERRING PROVIDER: Lonni Dalton  END OF SESSION:      Past Medical History:  Diagnosis Date   Anemia    Aortic atherosclerosis (HCC)    Cerebral aneurysm    Community acquired pneumonia of right middle lobe of lung 09/20/2017   Emphysema of lung (HCC)    Esophagitis    Gastric ulcer    H. 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)    Stroke (HCC) 05/01/2020   Tobacco dependence    Past Surgical History:  Procedure Laterality Date   BIOPSY  04/10/2022   Procedure: BIOPSY;  Surgeon: Leigh Elspeth SQUIBB, MD;  Location: [facility] ENDOSCOPY;  Service: Gastroenterology;;   ESOPHAGOGASTRODUODENOSCOPY (EGD) WITH PROPOFOL  N/A 04/10/2022   Procedure: ESOPHAGOGASTRODUODENOSCOPY (EGD) WITH PROPOFOL ;  Surgeon: Leigh Elspeth SQUIBB, MD;  Location: [facility] ENDOSCOPY;  Service: Gastroenterology;  Laterality: N/A;   HEMOSTASIS CONTROL  04/10/2022   Procedure: HEMOSTASIS CONTROL;  Surgeon: Leigh Elspeth SQUIBB, MD;  Location: [facility] ENDOSCOPY;  Service: Gastroenterology;;  purastat    implantable loop recorder placement  06/05/2020   Medtronic Reveal [place] model [phone] G) implantable loop recorder    Patient Active Problem List   Diagnosis Date Noted   Blurry vision, bilateral 11/25/2023   Acute ischemic stroke (HCC) 11/25/2023   Paroxysmal atrial fibrillation (HCC) 06/24/2023   Hypercoagulable state due to paroxysmal atrial fibrillation (HCC) 06/24/2023   BPH (benign prostatic hyperplasia) 12/20/2022   GERD without esophagitis 12/20/2022   Sepsis due to pneumonia (HCC) 12/20/2022   LLL pneumonia 12/19/2022   GI bleed 04/11/2022   Upper GI bleed 04/10/2022   Acute gastric ulcer with hemorrhage 04/10/2022   Dark stools  04/09/2022   Heme positive stool 04/09/2022   Anemia 04/09/2022   Antiplatelet or antithrombotic long-term use 04/09/2022   Hypotension due to hypovolemia 04/08/2022   SIRS (systemic inflammatory response syndrome) (HCC) 04/08/2022   Acute encephalopathy 04/08/2022   Tobacco abuse 03/27/2022   Acute respiratory failure due to COVID-19 (HCC) 03/25/2022   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   Generalized weakness 03/27/2021   Ascending aortic aneurysm (HCC) 09/22/2020   COPD (chronic obstructive pulmonary disease) (HCC) 05/18/2020   Essential hypertension 05/13/2020   Dyslipidemia 05/13/2020   History of CVA (cerebrovascular accident) 05/12/2020    ONSET DATE: 11/25/23  REFERRING DIAG:  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  Rationale for Evaluation and Treatment: Rehabilitation  SUBJECTIVE:  SUBJECTIVE STATEMENT: I am walking with the cane today, but mostly been using it. The callus on my foot supposed to get fixed on the 7th.    Pt accompanied by: daughter  PERTINENT HISTORY:  11/25/23 Adrian Rodgers is a 83 y.o. 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   PAIN:  Are you having pain? L foot because I have a callus   PRECAUTIONS: Fall  RED FLAGS: None   WEIGHT BEARING RESTRICTIONS: No  FALLS: Has patient fallen in last 6 months? No  LIVING ENVIRONMENT: Lives with: lives alone Lives in: House/apartment Stairs: Yes: External: 4 in  front, 8 in the back steps; on right going up and can reach both Has following equipment at home: Single point cane, Quad cane small base, Walker - 2 wheeled, Environmental consultant - 4 wheeled, Wheelchair (manual), and Grab bars  PLOF: Independent with basic ADLs and Independent with household mobility without device  PATIENT GOALS: walk without assistive device, be able to pass your tests   OBJECTIVE:  Note: Objective measures were completed at Evaluation unless otherwise noted.  DIAGNOSTIC FINDINGS: MRI brain showed small pontine stroke, correlates well to INO.  Dilated by neurology, suspected small vessel disease due to smoking, hypercholesterolemia. - CTA head and neck unremarkable, echocardiogram with PFO, likely unrelated  COGNITION: Overall cognitive status: Within functional limits for tasks assessed   SENSATION: WFL   POSTURE: rounded shoulders and forward head  LOWER EXTREMITY ROM:  WFL    LOWER EXTREMITY MMT:  grossly 4+/5    BED MOBILITY:  Not tested  TRANSFERS: Sit to stand: Complete Independence  Assistive device utilized: None     Stand to sit: Complete Independence  Assistive device utilized: None     Chair to chair: Modified independence and SBA  Assistive device utilized: Environmental consultant - 4 wheeled        CURB:  Not tested  STAIRS: Findings: Level of Assistance: Modified independence, Stair Negotiation Technique: Step to Pattern with Single Rail on Right Bilateral Rails, Number of Stairs: 4 in front and 8 in the front, and Height of Stairs: 6     GAIT: Findings: Gait Characteristics: step to pattern, decreased stride length, shuffling, narrow BOS, poor foot clearance- Right, and poor foot clearance- Left, Distance walked: in clinic distances, Assistive device utilized:Walker - 4 wheeled, and Level of assistance: Modified independence  FUNCTIONAL TESTS:  5 times sit to stand: 20s from chair  Timed up and go (TUG): 31s with rolling walker                                                                                                                                TREATMENT DATE:  12/31/23 NuStep Leg ext 5# HS curls 20# Step ups 6 Calf raises  Walking on  beam  Side steps on airex Cone taps    12/29/23 NuStep L5x28mins  Seated hip abd green 2x10 Ball squeeze 2x10 STS with OHP 2x10 2# marching, hip abd, hip ext, HS curls standing in bars 2x10 Side steps on beam TUG- 24s w/quad cane  12/22/23 NuStep L5x53mins  LAQ 2# 2x10 2# marching with walker  HS curls green 2x10 STS 2x10 Seated heel to toe raises 2x10 Step taps 4 Step ups 4  12/15/23- EVAL    PATIENT EDUCATION: Education details: POC, HEP, fall risk, stroke education  Person educated: Patient and Child(ren) Education method: Explanation and Demonstration Education comprehension: verbalized understanding and returned demonstration  HOME EXERCISE PROGRAM: Access Code: [email or web address] URL: [email or web address] Date: 12/15/2023 Prepared by: Almetta Fam  Exercises - Standing March with Counter Support  - 1 x daily - 7 x weekly - 2 sets - 10 reps - Standing Hip Abduction with Counter Support  - 1 x daily - 7 x weekly - 2 sets - 10 reps - Heel Toe Raises with Counter Support  - 1 x daily - 7 x weekly - 2 sets - 10 reps - Sit to Stand  - 1 x daily - 7 x weekly - 2 sets - 10 reps  GOALS: Goals reviewed with patient? Yes  SHORT TERM GOALS: Target date: 01/26/24  Patient will be independent with initial HEP. Baseline: given 12/15/23 Goal status: INITIAL  2.  Patient will demonstrate decreased fall risk by scoring < 20 sec on TUG with LRAD. Baseline: 31s with rolling walker Goal status: IN PROGRESS 24s with quad cane 12/29/23  3.  Patient will be educated on strategies to decrease risk of falls.  Baseline:  Goal status: INITIAL   LONG TERM GOALS: Target date: 03/08/24  Patient will be independent with advanced/ongoing HEP to improve outcomes and carryover.  Baseline:  Goal  status: INITIAL  2.  Patient will be able to ambulate 500' with LRAD with good safety to access community.  Baseline: using rolling walker, poor foot clearance, shuffling  Goal status: INITIAL  3.  Patient will demonstrate improved functional LE strength as demonstrated by 5xSTS <14s. Baseline: 20s Goal status: INITIAL  4.  Patient will demonstrate decreased fall risk by scoring < 15 sec on TUG with LRAD.  Baseline: 31s with rolling walker Goal status: INITIAL   ASSESSMENT:  CLINICAL IMPRESSION: Patient returns walking with quad cane today. Still requires cues to clear his feet, he reports the callus on his L foot is messing up his gait. We continued to work on some strengthening in both standing and sitting. Also did some balance on the beam, which he is unable to do without support. Patient has made some improvement with TUG speed using LRAD. Continue to improve his balance and strength to increase independence with safety.   Patient is a 83 y.o. male who was seen today for physical therapy evaluation and treatment for double vision and a stroke on 11/25/23. He is currently walking with a rolling walker and reports he uses his cane some in the house. He has a callus on his L foot; his daughter reports he gets them often and gets treatment for it. Patient reports once the callus is treated, he feels much better and can walk better. He does live on his own and mostly independent but presents as a fall risk and has decreased safety awareness. Patient will benefit from PT to address his gait and balance impairments to improve his functional independence and decrease his  risk for falls and another stroke.   OBJECTIVE IMPAIRMENTS: Abnormal gait, decreased activity tolerance, decreased balance, decreased endurance, difficulty walking, decreased safety awareness, and improper body mechanics.   ACTIVITY LIMITATIONS: bending, standing, squatting, stairs, transfers, and locomotion  level  PARTICIPATION LIMITATIONS: cleaning, community activity, and yard work  PERSONAL FACTORS: Age, Behavior pattern, Past/current experiences, and Transportation are also affecting patient's functional outcome.   REHAB POTENTIAL: Good  CLINICAL DECISION MAKING: Stable/uncomplicated  EVALUATION COMPLEXITY: Low  PLAN:  PT FREQUENCY: 2x/week  PT DURATION: 12 weeks  PLANNED INTERVENTIONS: 97110-Therapeutic exercises, 97530- Therapeutic activity, 97112- Neuromuscular re-education, 97535- Self Care, 02859- Manual therapy, [phone]- Gait training, Patient/Family education, Balance training, Stair training, DME instructions, Cryotherapy, and Moist heat  PLAN FOR NEXT SESSION: gait training, balance, LE strengthening    Almetta Fam, PT 12/31/2023, 7:55 AM

## 2024-01-05 ENCOUNTER — Inpatient Hospital Stay: Admitting: Adult Health

## 2024-01-05 NOTE — Therapy (Signed)
 OUTPATIENT PHYSICAL THERAPY NEURO TREATMENT   Patient Name: Adrian Rodgers MRN: [id number] DOB:1940-09-25, 83 y.o., male Today's Date: 01/06/2024   PCP: Adrian Rodgers REFERRING PROVIDER: Lonni Rodgers  END OF SESSION:  PT End of Session - 01/06/24 1313     Visit Number 4    Date for PT Re-Evaluation 03/08/24    Authorization Type [employer]    PT Start Time 1313    PT Stop Time 1355    PT Time Calculation (min) 42 min             Past Medical History:  Diagnosis Date   Anemia    Aortic atherosclerosis (HCC)    Cerebral aneurysm    Community acquired pneumonia of right middle lobe of lung 09/20/2017   Emphysema of lung (HCC)    Esophagitis    Gastric ulcer    H. 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)    Stroke (HCC) 05/01/2020   Tobacco dependence    Past Surgical History:  Procedure Laterality Date   BIOPSY  04/10/2022   Procedure: BIOPSY;  Surgeon: Adrian Elspeth SQUIBB, MD;  Location: [facility] ENDOSCOPY;  Service: Gastroenterology;;   ESOPHAGOGASTRODUODENOSCOPY (EGD) WITH PROPOFOL  N/A 04/10/2022   Procedure: ESOPHAGOGASTRODUODENOSCOPY (EGD) WITH PROPOFOL ;  Surgeon: Adrian Elspeth SQUIBB, MD;  Location[facility] ENDOSCOPY;  Service: Gastroenterology;  Laterality: N/A;   HEMOSTASIS CONTROL  04/10/2022   Procedure: HEMOSTASIS CONTROL;  Surgeon: Adrian Elspeth SQUIBB, MD;  Location: [facility] ENDOSCOPY;  Service: Gastroenterology;;  purastat    implantable loop recorder placement  06/05/2020   Medtronic Reveal [place] model [id number] [phone] G) implantable loop recorder    Patient Active Problem List   Diagnosis Date Noted   Blurry vision, bilateral 11/25/2023   Acute ischemic stroke (HCC) 11/25/2023   Paroxysmal atrial fibrillation (HCC) 06/24/2023   Hypercoagulable state due to paroxysmal atrial fibrillation (HCC) 06/24/2023   BPH (benign prostatic hyperplasia) 12/20/2022   GERD without  esophagitis 12/20/2022   Sepsis due to pneumonia (HCC) 12/20/2022   LLL pneumonia 12/19/2022   GI bleed 04/11/2022   Upper GI bleed 04/10/2022   Acute gastric ulcer with hemorrhage 04/10/2022   Dark stools 04/09/2022   Heme positive stool 04/09/2022   Anemia 04/09/2022   Antiplatelet or antithrombotic long-term use 04/09/2022   Hypotension due to hypovolemia 04/08/2022   SIRS (systemic inflammatory response syndrome) (HCC) 04/08/2022   Acute encephalopathy 04/08/2022   Tobacco abuse 03/27/2022   Acute respiratory failure due to COVID-19 (HCC) 03/25/2022   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   Generalized weakness 03/27/2021   Ascending aortic aneurysm (HCC) 09/22/2020   COPD (chronic obstructive pulmonary disease) (HCC) 05/18/2020   Essential hypertension 05/13/2020   Dyslipidemia 05/13/2020   History of CVA (cerebrovascular accident) 05/12/2020    ONSET DATE: 11/25/23  REFERRING DIAG:  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    Pt accompanied by: daughter  PERTINENT HISTORY:  11/25/23 Adrian Rodgers is a 83 y.o. 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 MRI brain  confirmed punctate left pontine infarction.   PAIN:  Are you having pain? L foot because I have a callus   PRECAUTIONS: Fall  RED FLAGS: None   WEIGHT BEARING RESTRICTIONS: No  FALLS: Has patient fallen in last 6 months? No  LIVING ENVIRONMENT: Lives with: lives alone Lives in: House/apartment Stairs: Yes: External: 4 in front, 8 in the back steps; on right going up and can reach both Has following equipment at home: Single point cane, Quad cane small base, Walker - 2 wheeled, Environmental consultant - 4 wheeled, Wheelchair (manual), and Grab bars  PLOF: Independent with basic ADLs and Independent with household mobility without device  PATIENT GOALS: walk without assistive device, be able to pass your tests   OBJECTIVE:  Note: Objective measures were completed at Evaluation unless otherwise noted.  DIAGNOSTIC FINDINGS: MRI brain showed small pontine stroke, correlates well to INO.  Dilated by neurology, suspected small vessel disease due to smoking, hypercholesterolemia. - CTA head and neck unremarkable, echocardiogram with PFO, likely unrelated  COGNITION: Overall cognitive status: Within functional limits for tasks assessed   SENSATION: WFL   POSTURE: rounded shoulders and forward head  LOWER EXTREMITY ROM:  WFL    LOWER EXTREMITY MMT:  grossly 4+/5    BED MOBILITY:  Not tested  TRANSFERS: Sit to stand: Complete Independence  Assistive device utilized: None     Stand to sit: Complete Independence  Assistive device utilized: None     Chair to chair: Modified independence and SBA  Assistive device utilized: Environmental consultant - 4 wheeled        CURB:  Not tested  STAIRS: Findings: Level of Assistance: Modified independence, Stair Negotiation Technique: Step to Pattern with Single Rail on Right Bilateral Rails, Number of Stairs: 4 in front and 8 in the front, and Height of Stairs: 6     GAIT: Findings: Gait Characteristics: step to pattern, decreased stride length, shuffling,  narrow BOS, poor foot clearance- Right, and poor foot clearance- Left, Distance walked: in clinic distances, Assistive device utilized:Walker - 4 wheeled, and Level of assistance: Modified independence  FUNCTIONAL TESTS:  5 times sit to stand: 20s from chair  Timed up and go (TUG): 31s with rolling walker                                                                                                                               TREATMENT DATE:  01/06/24 NuStep L5x25mins Leg ext 10# 2x10 HS curls 20# 2x10 Step ups 4 Calf raises 2x10 Side steps on airex Cone taps  Walking on beam  12/29/23 NuStep L5x64mins  Seated hip abd green 2x10 [place]  squeeze 2x10 STS with OHP 2x10 2# marching, hip abd, hip ext, HS curls standing in bars 2x10 Side steps on beam TUG- 24s w/quad cane  12/22/23 NuStep L5x33mins  LAQ 2# 2x10 2# marching with walker  HS curls green 2x10 STS 2x10 Seated heel to toe raises 2x10 Step taps 4 Step ups 4  12/15/23- EVAL    PATIENT EDUCATION: Education details: POC, HEP, fall risk, stroke education  Person educated: Patient and Child(ren) Education method: Explanation and Demonstration Education comprehension: verbalized understanding and returned demonstration  HOME EXERCISE PROGRAM: Access Code: [email or web address] URL: [email or web address] Date: 12/15/2023 Prepared by: Almetta Fam  Exercises - Standing March with Counter Support  - 1 x daily - 7 x weekly - 2 sets - 10 reps - Standing Hip Abduction with Counter Support  - 1 x daily - 7 x weekly - 2 sets - 10 reps - Heel Toe Raises with Counter Support  - 1 x daily - 7 x weekly - 2 sets - 10 reps - Sit to Stand  - 1 x daily - 7 x weekly - 2 sets - 10 reps  GOALS: Goals reviewed with patient? Yes  SHORT TERM GOALS: Target date: 01/26/24  Patient will be independent with initial HEP. Baseline: given 12/15/23 Goal status: IN PROGRESS 01/06/24  2.  Patient will demonstrate decreased fall risk by  scoring < 20 sec on TUG with LRAD. Baseline: 31s with rolling walker Goal status: IN PROGRESS 24s with quad cane 12/29/23  3.  Patient will be educated on strategies to decrease risk of falls.  Baseline:  Goal status: IN PROGRESS 01/06/24   LONG TERM GOALS: Target date: 03/08/24  Patient will be independent with advanced/ongoing HEP to improve outcomes and carryover.  Baseline:  Goal status: INITIAL  2.  Patient will be able to ambulate 500' with LRAD with good safety to access community.  Baseline: using rolling walker, poor foot clearance, shuffling  Goal status: IN PROGRESS using quad cane but not safe 01/06/24  3.  Patient will demonstrate improved functional LE strength as demonstrated by 5xSTS <14s. Baseline: 20s Goal status: INITIAL  4.  Patient will demonstrate decreased fall risk by scoring < 15 sec on TUG with LRAD.  Baseline: 31s with rolling walker Goal status: INITIAL   ASSESSMENT:  CLINICAL IMPRESSION: Patient returns walking with quad cane today, his safety awareness is not the best. Still requires cues to clear his feet, and sequencing with cane to avoid tripping over it with his R foot. We continued to work on some strengthening on machines today. Also worked on balance, he needs to hold with most interventions. Able to do some cone taps without support. Continue to improve his balance and strength to increase independence with safety.   EVALUATION:  Patient is a 83 y.o. male who was seen today for physical therapy evaluation and treatment for double vision and a stroke on 11/25/23. He is currently walking with a rolling walker and reports he uses his cane some in the house. He has a callus on his L foot; his daughter reports he gets them often and gets treatment for it. Patient reports once the callus is treated, he feels much better and can walk better. He does live on his own and mostly independent but presents as a fall risk and has decreased safety awareness. Patient  will benefit from PT to address his gait and balance impairments to improve his functional independence and decrease his risk for falls and another stroke.  OBJECTIVE IMPAIRMENTS: Abnormal gait, decreased activity tolerance, decreased balance, decreased endurance, difficulty walking, decreased safety awareness, and improper body mechanics.   ACTIVITY LIMITATIONS: bending, standing, squatting, stairs, transfers, and locomotion level  PARTICIPATION LIMITATIONS: cleaning, community activity, and yard work  PERSONAL FACTORS: Age, Behavior pattern, Past/current experiences, and Transportation are also affecting patient's functional outcome.   REHAB POTENTIAL: Good  CLINICAL DECISION MAKING: Stable/uncomplicated  EVALUATION COMPLEXITY: Low  PLAN:  PT FREQUENCY: 2x/week  PT DURATION: 12 weeks  PLANNED INTERVENTIONS: 97110-Therapeutic exercises, 97530- Therapeutic activity, 97112- Neuromuscular re-education, 97535- Self Care, 02859- Manual therapy, [phone]- Gait training, Patient/Family education, Balance training, Stair training, DME instructions, Cryotherapy, and Moist heat  PLAN FOR NEXT SESSION: gait training, balance, LE strengthening    Almetta Fam, PT 01/06/2024, 1:57 PM

## 2024-01-06 ENCOUNTER — Ambulatory Visit: Attending: Family Medicine | Admitting: Occupational Therapy

## 2024-01-06 ENCOUNTER — Encounter: Payer: Self-pay | Admitting: Occupational Therapy

## 2024-01-06 ENCOUNTER — Ambulatory Visit

## 2024-01-06 DIAGNOSIS — R41840 Attention and concentration deficit: Secondary | ICD-10-CM

## 2024-01-06 DIAGNOSIS — I639 Cerebral infarction, unspecified: Secondary | ICD-10-CM

## 2024-01-06 DIAGNOSIS — R41842 Visuospatial deficit: Secondary | ICD-10-CM

## 2024-01-06 DIAGNOSIS — R41844 Frontal lobe and executive function deficit: Secondary | ICD-10-CM

## 2024-01-06 DIAGNOSIS — R278 Other lack of coordination: Secondary | ICD-10-CM | POA: Diagnosis present

## 2024-01-06 DIAGNOSIS — H532 Diplopia: Secondary | ICD-10-CM | POA: Insufficient documentation

## 2024-01-06 DIAGNOSIS — R2689 Other abnormalities of gait and mobility: Secondary | ICD-10-CM | POA: Diagnosis present

## 2024-01-06 NOTE — Therapy (Signed)
 OUTPATIENT OCCUPATIONAL THERAPY NEURO Treatment  Patient Name: Adrian Rodgers MRN: [id number] DOB:[birth year], 83 y.o., male Today's Date: 01/06/2024  PCP: Jon Berth NP REFERRING PROVIDER: Dr. Jonel  END OF SESSION:  OT End of Session - 01/06/24 1234     Visit Number 6    Number of Visits 17    Date for OT Re-Evaluation 02/16/24    Authorization Type [employer] Medicare    Authorization - Visit Number 6    Progress Note Due on Visit 10    OT Start Time 1232    OT Stop Time 1312    OT Time Calculation (min) 40 min             Past Medical History:  Diagnosis Date   Anemia    Aortic atherosclerosis (HCC)    Cerebral aneurysm    Community acquired pneumonia of right middle lobe of lung 09/20/2017   Emphysema of lung (HCC)    Esophagitis    Gastric ulcer    H. 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)    Stroke (HCC) 05/01/2020   Tobacco dependence    Past Surgical History:  Procedure Laterality Date   BIOPSY  04/10/2022   Procedure: BIOPSY;  Surgeon: Leigh Elspeth SQUIBB, MD;  Location: [facility] ENDOSCOPY;  Service: Gastroenterology;;   ESOPHAGOGASTRODUODENOSCOPY (EGD) WITH PROPOFOL  N/A 04/10/2022   Procedure: ESOPHAGOGASTRODUODENOSCOPY (EGD) WITH PROPOFOL ;  Surgeon: Leigh Elspeth SQUIBB, MD;  Location: [facility] ENDOSCOPY;  Service: Gastroenterology;  Laterality: N/A;   HEMOSTASIS CONTROL  04/10/2022   Procedure: HEMOSTASIS CONTROL;  Surgeon: Leigh Elspeth SQUIBB, MD;  Location: [facility] ENDOSCOPY;  Service: Gastroenterology;;  purastat    implantable loop recorder placement  06/05/2020   Medtronic Reveal [place] model [id number]) implantable loop recorder    Patient Active Problem List   Diagnosis Date Noted   Blurry vision, bilateral 11/25/2023   Acute ischemic stroke (HCC) 11/25/2023   Paroxysmal atrial fibrillation (HCC) 06/24/2023   Hypercoagulable state due to paroxysmal  atrial fibrillation (HCC) 06/24/2023   BPH (benign prostatic hyperplasia) 12/20/2022   GERD without esophagitis 12/20/2022   Sepsis due to pneumonia (HCC) 12/20/2022   LLL pneumonia 12/19/2022   GI bleed 04/11/2022   Upper GI bleed 04/10/2022   Acute gastric ulcer with hemorrhage 04/10/2022   Dark stools 04/09/2022   Heme positive stool 04/09/2022   Anemia 04/09/2022   Antiplatelet or antithrombotic long-term use 04/09/2022   Hypotension due to hypovolemia 04/08/2022   SIRS (systemic inflammatory response syndrome) (HCC) 04/08/2022   Acute encephalopathy 04/08/2022   Tobacco abuse 03/27/2022   Acute respiratory failure due to COVID-19 (HCC) 03/25/2022   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   Generalized weakness 03/27/2021   Ascending aortic aneurysm (HCC) 09/22/2020   COPD (chronic obstructive pulmonary disease) (HCC) 05/18/2020   Essential hypertension 05/13/2020   Dyslipidemia 05/13/2020   History of CVA (cerebrovascular accident) 05/12/2020    ONSET DATE: 11/25/23  REFERRING DIAG:  Diagnosis  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 CT negative. 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 PMH significant for hyperlipidemia, gastric ulcer, H. pylori, hypertension, CVA, PNA and tobacco abuse.   PRECAUTIONS: Fall  WEIGHT BEARING  RESTRICTIONS: No  PAIN: none  FALLS: Has patient fallen in last 6 months? No  LIVING ENVIRONMENT: Lives with: lives alone Lives in: House/apartment Stairs: No Has following equipment at home: Quad cane small base and grab bar a tub  PLOF: Independent  PATIENT GOALS: improve vision  OBJECTIVE:  Note: Objective measures were completed at Evaluation unless otherwise noted.  HAND DOMINANCE: Right  ADLs: Overall ADLs: mod I with all basic ADLS. Transfers/ambulation related to ADLs:mod I with walker   IADLs:  Light housekeeping: has not attempted yet Meal Prep: Pt's dtr has been preparing meals and he has been warming in the microwave. Community mobility: supervision with small based quad cane, Pt has walker and he practiced using in clinic with improved safety. Medication management: dtr fills pillbox, alexa reminds pt to take meds Financial management: Pt's dtr handles    MOBILITY STATUS: decreased balance uses quad cane or walker     FUNCTIONAL OUTCOME MEASURES: 71M number cancellation- 88% accuracy  UPPER EXTREMITY ROM:  WFLS    UPPER EXTREMITY MMT:   bilateral UE strength is grossly 4+/5    HAND FUNCTION: Grip strength: Right: 35 lbs; Left: 30 lbs  COORDINATION: 9 Hole Peg test: Right: 41.71 sec; Left: 35.66 sec  SENSATION: WFL      COGNITION: Overall cognitive status: Impaired, deceased short term memory, spells WORLD backwards   VISION: Subjective report: Pt reports double vision   VISION ASSESSMENT:  L eye does not track to right side Impaired vision Eye alignment: 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 Pt was unsteady ambulating with quad cane. He has a walker at home, and he used the walker to walkt to the waiting room with better balance. Pt was encouraged to use his walker for safety.                                                                                                                            TREATMENT DATE:01/06/24-UBE x 6 mins level 2 for conditoning copying small peg design with RUE for vision and cognition as well as fine motor coordination, pt completed design with 100% accuracy. Environmental scanning: 14/15 located first pass, only 1 items missed. Reviewed memory and visual  compensation strategies, pt verbalized understanding.  12/29/23 Reviewed vision HEP, 5 reps each, eyes individually then together min v.c  Pt with improved performance today and less diplopia. Finding same symbols level 2 on constant therapy, 99% accuracy Placing grooved pegs into pegboard with RUE for increased fine motor coordination, min difficulty/ v.c  Removing pegs with in hand manipulation, mod difficulty/ drops Pt demonstrates difficulty with in hand manipulation. completing a 12 piece puzzle for cognitve and visual skills, mod v.c and increased time required. RUE 9 hole peg test: 26.70 12/22/23- Pt arrived late 78M number cancellation task without partial occlusion with 100% accuracy. copying picture Orthoptist with RUE for visual skills as well as fine motor coordination, min difficulty manipulating pieces with RUE, design was copied correctly with only 1 v.c. to double check Memory compensations were issued and reviewed. Pt verbalized understanding.   12/15/23 Pt was accompanied by his dtr today. Pt ambulated to therapy gym with rollator with close supervision and min v.c Pt was noted to drag feet to avoid weightbearing through LLE where he has a callous. Therapist reviewed pt's vision  HEP with pt/ dtr, mod v.c for pt performance. 5-10 reps each exercise. Flipping and dealing playing cards with right and left UE's min-mod difficulty, mod v.c, increased time required due to lethargy. Pt was noted to fall asleep several times. Pt returned to waiting room ambulating via rollator with minguard for safety.   12/04/23-Pt arrived with walker and he ambulates more safely supervision-mod I with walker than quad cane. Pt arrived wearing clear glasses withnasal portion both eye occluded. Therapist worked with pt and was able to remove tape from nasal portion of right lens. Pt was instucted in  diplopia HEP, mod v.c and demonstration 5-10 reps each. Then beginning coordination HEP    12/01/23 eval only        PATIENT EDUCATION: Education details: memory compensations, vision compensations Person educated: Patient,  Education method: Explanation,  Education comprehension: verbalized understanding,   HOME EXERCISE PROGRAM: 12/04/23- diplopia HEP, coordinationHEP- beginning 12/22/23- memory compensaitons  GOALS: Goals reviewed with patient? Yes  SHORT TERM GOALS: Target date: 12/31/23  I with inital HEP for vision.  Goal status:  met, 01/06/24  2.  I with compensatory strategies for visual deficits.  Goal status:met, 01/06/24  3.  Pt will perform tabletop scanning activities with 90% or better accuracy, using compensations prn.  Goal status:  ongoing, completed small peg design corrcetly today 01/06/24  4.  Pt will navigate a busy environment, locating items with 75% accuracy without LOB   Goal status: met, 14/15 located first pass 01/06/24  5.  I with memory compensations  Goal status: 12/22/23 issued, needs reinforcement    LONG TERM GOALS: Target date: 02/02/24  I with updated HEP  Goal status:met 12/29/23- pt returned demonstration of vision HEP  2.  Pt will demonstrate improved RUE fine motor coordination as evidenced by decreasing 9 hole peg test score to 35 secs or  less.  Goal status: 26.70, met, 12/29/23- however pt with difficulty with in hand manipulation  3.  Pt will perform environmental scanning activities with 90% or better accuracy, using compensations prn,  without LOB  Goal status: ongoing, 14/15 located today 01/06/24  4.  Pt will demonstrate ability to read a page of newsprint without partial occulsion and minimal reports of diplopia.  Goal status: ongoing pt did not bring reading glasses today 12/29/23   ASSESSMENT:  CLINICAL IMPRESSION: Pt is progressing towards goals. Pt's diplopia  is improving. He perfromed well today with tabletop and environmantal scanning tasks. PERFORMANCE DEFICITS: in functional skills including ADLs, IADLs, coordination, dexterity, Fine motor control, mobility, balance, endurance, decreased knowledge of precautions, vision, and UE functional use, cognitive skills including memory, problem solving, and safety awareness, and psychosocial skills including coping strategies, environmental adaptation, habits, interpersonal interactions, and routines and behaviors.   IMPAIRMENTS: are limiting patient from ADLs, IADLs, play, leisure, and social participation.   CO-MORBIDITIES: may have co-morbidities  that affects occupational performance. Patient will benefit from skilled OT to address above impairments and improve overall function.  MODIFICATION OR ASSISTANCE TO COMPLETE EVALUATION: Min-Moderate modification of tasks or assist with assess necessary to complete an evaluation.  OT OCCUPATIONAL PROFILE AND HISTORY: Detailed assessment: Review of records and additional review of physical, cognitive, psychosocial history related to current functional performance.  CLINICAL DECISION MAKING: LOW - limited treatment options, no task modification necessary  REHAB POTENTIAL: Good  EVALUATION COMPLEXITY: Low    PLAN:  OT FREQUENCY: 16 visits  OT DURATION: 10 weeks  PLANNED INTERVENTIONS: 97168 OT Re-evaluation, 97535  self care/ADL training, 02889 therapeutic exercise, 97530 therapeutic activity, 97112 neuromuscular re-education, 97140 manual therapy, 97116 gait training, 02964 ultrasound, 97018 paraffin, 02989 moist heat, 97010 cryotherapy, 97129 Cognitive training (first 15 min), 02869 Cognitive training(each additional 15 min), passive range of motion, balance training, functional mobility training, visual/perceptual remediation/compensation, energy conservation, coping strategies training, patient/family education, and DME and/or AE instructions  RECOMMENDED OTHER SERVICES: PT  CONSULTED AND AGREED WITH PLAN OF CARE: Patient and family member/caregiver  PLAN FOR NEXT SESSION: pipe tree design   Trayvon Trumbull, OT 01/06/2024, 12:35 PM

## 2024-01-07 NOTE — Progress Notes (Signed)
 [facility] Summary Report / Loop Recorder

## 2024-01-12 ENCOUNTER — Ambulatory Visit: Admitting: Occupational Therapy

## 2024-01-12 ENCOUNTER — Encounter: Payer: Self-pay | Admitting: Occupational Therapy

## 2024-01-12 ENCOUNTER — Ambulatory Visit

## 2024-01-12 DIAGNOSIS — R278 Other lack of coordination: Secondary | ICD-10-CM

## 2024-01-12 DIAGNOSIS — R41840 Attention and concentration deficit: Secondary | ICD-10-CM

## 2024-01-12 DIAGNOSIS — R41842 Visuospatial deficit: Secondary | ICD-10-CM

## 2024-01-12 DIAGNOSIS — R41844 Frontal lobe and executive function deficit: Secondary | ICD-10-CM

## 2024-01-12 DIAGNOSIS — I639 Cerebral infarction, unspecified: Secondary | ICD-10-CM

## 2024-01-12 DIAGNOSIS — R2689 Other abnormalities of gait and mobility: Secondary | ICD-10-CM

## 2024-01-12 NOTE — Therapy (Signed)
 OUTPATIENT OCCUPATIONAL THERAPY NEURO Treatment  Patient Name: Adrian Rodgers MRN: [id number] DOB:1941-02-18, 83 y.o., male Today's Date: 01/12/2024  PCP: Jon Berth NP REFERRING PROVIDER: Dr. Jonel  END OF SESSION:  OT End of Session - 01/12/24 1545     Visit Number 7    Number of Visits 17    Date for OT Re-Evaluation 02/16/24    Authorization Type [employer] Medicare    Authorization - Visit Number 7    Progress Note Due on Visit 10    OT Start Time 1407    OT Stop Time 1445    OT Time Calculation (min) 38 min              Past Medical History:  Diagnosis Date   Anemia    Aortic atherosclerosis (HCC)    Cerebral aneurysm    Community acquired pneumonia of right middle lobe of lung 09/20/2017   Emphysema of lung (HCC)    Esophagitis    Gastric ulcer    H. 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)    Stroke (HCC) 05/01/2020   Tobacco dependence    Past Surgical History:  Procedure Laterality Date   BIOPSY  04/10/2022   Procedure: BIOPSY;  Surgeon: Leigh Elspeth SQUIBB, MD;  Location: [facility] ENDOSCOPY;  Service: Gastroenterology;;   ESOPHAGOGASTRODUODENOSCOPY (EGD) WITH PROPOFOL  N/A 04/10/2022   Procedure: ESOPHAGOGASTRODUODENOSCOPY (EGD) WITH PROPOFOL ;  Surgeon: Leigh Elspeth SQUIBB, MD;  Location: [facility] ENDOSCOPY;  Service: Gastroenterology;  Laterality: N/A;   HEMOSTASIS CONTROL  04/10/2022   Procedure: HEMOSTASIS CONTROL;  Surgeon: Leigh Elspeth SQUIBB, MD;  Location: [facility] ENDOSCOPY;  Service: Gastroenterology;;  purastat    implantable loop recorder placement  06/05/2020   Medtronic Reveal [place] model [id number]) implantable loop recorder    Patient Active Problem List   Diagnosis Date Noted   Blurry vision, bilateral 11/25/2023   Acute ischemic stroke (HCC) 11/25/2023   Paroxysmal atrial fibrillation (HCC) 06/24/2023   Hypercoagulable state due to paroxysmal  atrial fibrillation (HCC) 06/24/2023   BPH (benign prostatic hyperplasia) 12/20/2022   GERD without esophagitis 12/20/2022   Sepsis due to pneumonia (HCC) 12/20/2022   LLL pneumonia 12/19/2022   GI bleed 04/11/2022   Upper GI bleed 04/10/2022   Acute gastric ulcer with hemorrhage 04/10/2022   Dark stools 04/09/2022   Heme positive stool 04/09/2022   Anemia 04/09/2022   Antiplatelet or antithrombotic long-term use 04/09/2022   Hypotension due to hypovolemia 04/08/2022   SIRS (systemic inflammatory response syndrome) (HCC) 04/08/2022   Acute encephalopathy 04/08/2022   Tobacco abuse 03/27/2022   Acute respiratory failure due to COVID-19 (HCC) 03/25/2022   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   Generalized weakness 03/27/2021   Ascending aortic aneurysm (HCC) 09/22/2020   COPD (chronic obstructive pulmonary disease) (HCC) 05/18/2020   Essential hypertension 05/13/2020   Dyslipidemia 05/13/2020   History of CVA (cerebrovascular accident) 05/12/2020    ONSET DATE: 11/25/23  REFERRING DIAG:  Diagnosis  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 CT negative. 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 PMH significant for hyperlipidemia, gastric ulcer, H. pylori, hypertension, CVA,  PNA and tobacco abuse.   PRECAUTIONS: Fall  WEIGHT BEARING RESTRICTIONS: No  PAIN: none  FALLS: Has patient fallen in last 6 months? No  LIVING ENVIRONMENT: Lives with: lives alone Lives in: House/apartment Stairs: No Has following equipment at home: Quad cane small base and grab bar a tub  PLOF: Independent  PATIENT GOALS: improve vision  OBJECTIVE:  Note: Objective measures were completed at Evaluation unless otherwise noted.  HAND DOMINANCE: Right  ADLs: Overall ADLs: mod I with all basic ADLS. Transfers/ambulation related to ADLs:mod I with walker   IADLs:  Light housekeeping: has not attempted yet Meal Prep: Pt's dtr has been preparing meals and he has been warming in the microwave. Community mobility: supervision with small based quad cane, Pt has walker and he practiced using in clinic with improved safety. Medication management: dtr fills pillbox, alexa reminds pt to take meds Financial management: Pt's dtr handles    MOBILITY STATUS: decreased balance uses quad cane or walker     FUNCTIONAL OUTCOME MEASURES: 38M number cancellation- 88% accuracy  UPPER EXTREMITY ROM:  WFLS    UPPER EXTREMITY MMT:   bilateral UE strength is grossly 4+/5    HAND FUNCTION: Grip strength: Right: 35 lbs; Left: 30 lbs  COORDINATION: 9 Hole Peg test: Right: 41.71 sec; Left: 35.66 sec  SENSATION: WFL      COGNITION: Overall cognitive status: Impaired, deceased short term memory, spells WORLD backwards   VISION: Subjective report: Pt reports double vision   VISION ASSESSMENT:  L eye does not track to right side Impaired vision Eye alignment: 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 Pt was unsteady ambulating with quad cane. He has a walker at home, and he used the walker to walkt to the waiting room with better balance. Pt was encouraged to use his walker for safety.                                                                                                                            TREATMENT DATE:01/12/24-Pipe tree design, x 2 reps for visual skills with a cognitve component, modv.c and difficulty for organization and reconizing pieces to copy design. Increased time required. Pt brought in his reading glassses however the lns kept  falling out so he was unable to use.  Reviewed memory strategies. UBE x 5 mins level 1 for conditioning  01/06/24-UBE x 6 mins level 2 for conditoning copying small peg design with RUE for vision and cognition as well as fine motor coordination, pt completed design with 100% accuracy. Environmental scanning: 14/15 located first pass, only 1 items missed. Reviewed memory and visual compensation strategies, pt verbalized understanding.  12/29/23 Reviewed vision HEP, 5 reps each, eyes individually then together min v.c  Pt with improved performance today and less diplopia. Finding same symbols level 2 on constant therapy, 99% accuracy Placing grooved pegs into pegboard with RUE for increased fine motor coordination, min difficulty/ v.c  Removing pegs with in hand manipulation, mod difficulty/ drops Pt demonstrates difficulty with in hand manipulation. completing a 12 piece puzzle for cognitve and visual skills, mod v.c and increased time required. RUE 9 hole peg test: 26.70 12/22/23- Pt arrived late 53M number cancellation task without partial occlusion with 100% accuracy. copying picture Orthoptist with RUE for visual skills as well as fine motor coordination, min difficulty  manipulating pieces with RUE, design was copied correctly with only 1 v.c. to double check Memory compensations were issued and reviewed. Pt verbalized understanding.     PATIENT EDUCATION: Education details see above Person educated: Patient,  Education method: Explanation,  Education comprehension: verbalized understanding,   HOME EXERCISE PROGRAM: 12/04/23- diplopia HEP, coordinationHEP- beginning 12/22/23- memory compensaitons  GOALS: Goals reviewed with patient? Yes  SHORT TERM GOALS: Target date: 12/31/23  I with inital HEP for vision.  Goal status:  met, 01/06/24  2.  I with compensatory strategies for visual deficits.  Goal status:met, 01/06/24  3.  Pt will perform tabletop scanning activities with 90% or better accuracy, using compensations prn.  Goal status:  ongoing, completed small peg design correctly today 01/06/24  4.  Pt will navigate a busy environment, locating items with 75% accuracy without LOB   Goal status: met, 14/15 located first pass 01/06/24  5.  I with memory compensations  Goal status: met, pt verbalizes understanding 01/12/24    LONG TERM GOALS: Target date: 02/02/24  I with updated HEP  Goal status:met 12/29/23- pt returned demonstration of vision HEP  2.  Pt will demonstrate improved RUE fine motor coordination as evidenced by decreasing 9 hole peg test score to 35 secs or less.  Goal status: 26.70, met, 12/29/23- however pt with difficulty with in hand manipulation  3.  Pt will perform environmental scanning activities with 90% or better accuracy, using compensations prn,  without LOB  Goal status: ongoing, 14/15 located today 01/06/24  4.  Pt will demonstrate ability to read a page of newsprint without partial occulsion and minimal reports of diplopia.  Goal status: ongoing still some reports of diplopia 01/12/24   ASSESSMENT:  CLINICAL IMPRESSION: Pt is progressing towards goals. Pt's diplopia is improving. Pt demonstrated difficulty with  the pipe tree design from a cognitve component. Pt had a difficult time following design and using key. PERFORMANCE DEFICITS: in functional skills including ADLs, IADLs, coordination, dexterity, Fine motor control, mobility, balance, endurance, decreased knowledge of precautions, vision, and UE functional use, cognitive skills including memory, problem solving, and safety awareness, and psychosocial skills including coping strategies, environmental adaptation, habits, interpersonal interactions, and routines and behaviors.   IMPAIRMENTS: are limiting patient from ADLs, IADLs, play, leisure, and social participation.   CO-MORBIDITIES: may have co-morbidities  that affects occupational performance. Patient will benefit from skilled OT to  address above impairments and improve overall function.  MODIFICATION OR ASSISTANCE TO COMPLETE EVALUATION: Min-Moderate modification of tasks or assist with assess necessary to complete an evaluation.  OT OCCUPATIONAL PROFILE AND HISTORY: Detailed assessment: Review of records and additional review of physical, cognitive, psychosocial history related to current functional performance.  CLINICAL DECISION MAKING: LOW - limited treatment options, no task modification necessary  REHAB POTENTIAL: Good  EVALUATION COMPLEXITY: Low    PLAN:  OT FREQUENCY: 16 visits  OT DURATION: 10 weeks  PLANNED INTERVENTIONS: 97168 OT Re-evaluation, 97535 self care/ADL training, 02889 therapeutic exercise, 97530 therapeutic activity, 97112 neuromuscular re-education, 97140 manual therapy, 97116 gait training, 02964 ultrasound, 97018 paraffin, 02989 moist heat, 97010 cryotherapy, 97129 Cognitive training (first 15 min), 02869 Cognitive training(each additional 15 min), passive range of motion, balance training, functional mobility training, visual/perceptual remediation/compensation, energy conservation, coping strategies training, patient/family education, and DME and/or AE  instructions  RECOMMENDED OTHER SERVICES: PT  CONSULTED AND AGREED WITH PLAN OF CARE: Patient and family member/caregiver  PLAN FOR NEXT SESSION: continue to address tabletop scanning   Paulanthony Gleaves, OT 01/12/2024, 3:47 PM

## 2024-01-12 NOTE — Therapy (Signed)
 OUTPATIENT PHYSICAL THERAPY NEURO TREATMENT   Patient Name: Adrian Rodgers MRN: [id number] DOB:12/22/1940, 83 y.o., male Today's Date: 01/12/2024   PCP: Jon Berth REFERRING PROVIDER: Lonni Dalton  END OF SESSION:  PT End of Session - 01/12/24 1445     Visit Number 5    Date for PT Re-Evaluation 03/08/24    Authorization Type [employer]    PT Start Time 1445    PT Stop Time 1530    PT Time Calculation (min) 45 min              Past Medical History:  Diagnosis Date   Anemia    Aortic atherosclerosis (HCC)    Cerebral aneurysm    Community acquired pneumonia of right middle lobe of lung 09/20/2017   Emphysema of lung (HCC)    Esophagitis    Gastric ulcer    H. 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)    Stroke (HCC) 05/01/2020   Tobacco dependence    Past Surgical History:  Procedure Laterality Date   BIOPSY  04/10/2022   Procedure: BIOPSY;  Surgeon: Leigh Elspeth SQUIBB, MD;  Location: [facility] ENDOSCOPY;  Service: Gastroenterology;;   ESOPHAGOGASTRODUODENOSCOPY (EGD) WITH PROPOFOL  N/A 04/10/2022   Procedure: ESOPHAGOGASTRODUODENOSCOPY (EGD) WITH PROPOFOL ;  Surgeon: Leigh Elspeth SQUIBB, MD;  Location: [facility] ENDOSCOPY;  Service: Gastroenterology;  Laterality: N/A;   HEMOSTASIS CONTROL  04/10/2022   Procedure: HEMOSTASIS CONTROL;  Surgeon: Leigh Elspeth SQUIBB, MD;  Location: [facility] ENDOSCOPY;  Service: Gastroenterology;;  purastat    implantable loop recorder placement  06/05/2020   Medtronic Reveal [place] model [id number]) implantable loop recorder    Patient Active Problem List   Diagnosis Date Noted   Blurry vision, bilateral 11/25/2023   Acute ischemic stroke (HCC) 11/25/2023   Paroxysmal atrial fibrillation (HCC) 06/24/2023   Hypercoagulable state due to paroxysmal atrial fibrillation (HCC) 06/24/2023   BPH (benign prostatic hyperplasia) 12/20/2022   GERD  without esophagitis 12/20/2022   Sepsis due to pneumonia (HCC) 12/20/2022   LLL pneumonia 12/19/2022   GI bleed 04/11/2022   Upper GI bleed 04/10/2022   Acute gastric ulcer with hemorrhage 04/10/2022   Dark stools 04/09/2022   Heme positive stool 04/09/2022   Anemia 04/09/2022   Antiplatelet or antithrombotic long-term use 04/09/2022   Hypotension due to hypovolemia 04/08/2022   SIRS (systemic inflammatory response syndrome) (HCC) 04/08/2022   Acute encephalopathy 04/08/2022   Tobacco abuse 03/27/2022   Acute respiratory failure due to COVID-19 (HCC) 03/25/2022   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   Generalized weakness 03/27/2021   Ascending aortic aneurysm (HCC) 09/22/2020   COPD (chronic obstructive pulmonary disease) (HCC) 05/18/2020   Essential hypertension 05/13/2020   Dyslipidemia 05/13/2020   History of CVA (cerebrovascular accident) 05/12/2020    ONSET DATE: 11/25/23  REFERRING DIAG:  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  I63.9 (ICD-10-CM) - Acute ischemic stroke (HCC)    THERAPY DIAG:  Other lack of coordination  Other abnormalities of gait and mobility  Visuospatial deficit  Cerebrovascular accident (CVA), unspecified mechanism (HCC)  Rationale for Evaluation and Treatment: Rehabilitation  SUBJECTIVE:  SUBJECTIVE STATEMENT: Doing alright.    Pt accompanied by: daughter  PERTINENT HISTORY:  11/25/23 Adrian Rodgers is a 83 y.o. 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   PAIN:  Are you having pain? L  foot because I have a callus   PRECAUTIONS: Fall  RED FLAGS: None   WEIGHT BEARING RESTRICTIONS: No  FALLS: Has patient fallen in last 6 months? No  LIVING ENVIRONMENT: Lives with: lives alone Lives in: House/apartment Stairs: Yes: External: 4 in front, 8 in the back steps; on right going up and can reach both Has following equipment at home: Single point cane, Quad cane small base, Walker - 2 wheeled, Environmental consultant - 4 wheeled, Wheelchair (manual), and Grab bars  PLOF: Independent with basic ADLs and Independent with household mobility without device  PATIENT GOALS: walk without assistive device, be able to pass your tests   OBJECTIVE:  Note: Objective measures were completed at Evaluation unless otherwise noted.  DIAGNOSTIC FINDINGS: MRI brain showed small pontine stroke, correlates well to INO.  Dilated by neurology, suspected small vessel disease due to smoking, hypercholesterolemia. - CTA head and neck unremarkable, echocardiogram with PFO, likely unrelated  COGNITION: Overall cognitive status: Within functional limits for tasks assessed   SENSATION: WFL   POSTURE: rounded shoulders and forward head  LOWER EXTREMITY ROM:  WFL    LOWER EXTREMITY MMT:  grossly 4+/5    BED MOBILITY:  Not tested  TRANSFERS: Sit to stand: Complete Independence  Assistive device utilized: None     Stand to sit: Complete Independence  Assistive device utilized: None     Chair to chair: Modified independence and SBA  Assistive device utilized: Environmental consultant - 4 wheeled        CURB:  Not tested  STAIRS: Findings: Level of Assistance: Modified independence, Stair Negotiation Technique: Step to Pattern with Single Rail on Right Bilateral Rails, Number of Stairs: 4 in front and 8 in the front, and Height of Stairs: 6     GAIT: Findings: Gait Characteristics: step to pattern, decreased stride length, shuffling, narrow BOS, poor foot clearance- Right, and poor foot clearance- Left, Distance  walked: in clinic distances, Assistive device utilized:Walker - 4 wheeled, and Level of assistance: Modified independence  FUNCTIONAL TESTS:  5 times sit to stand: 20s from chair  Timed up and go (TUG): 31s with rolling walker                                                                                                                               TREATMENT DATE:  01/12/24 NuStep L5x74mins TUG with cane STS on airex 2x10 Step ups 6  Marching on airex  Side steps over obstacles Feet together on airex then with eyes closed   01/06/24 NuStep L5x91mins Leg ext 10#  2x10 HS curls 20# 2x10 Step ups 4 Calf raises 2x10 Side steps on airex Cone taps  Walking on beam  12/29/23 NuStep L5x43mins  Seated hip abd green 2x10 Ball squeeze 2x10 STS with OHP 2x10 2# marching, hip abd, hip ext, HS curls standing in bars 2x10 Side steps on beam TUG- 24s w/quad cane  12/22/23 NuStep L5x61mins  LAQ 2# 2x10 2# marching with walker  HS curls green 2x10 STS 2x10 Seated heel to toe raises 2x10 Step taps 4 Step ups 4  12/15/23- EVAL    PATIENT EDUCATION: Education details: POC, HEP, fall risk, stroke education  Person educated: Patient and Child(ren) Education method: Explanation and Demonstration Education comprehension: verbalized understanding and returned demonstration  HOME EXERCISE PROGRAM: Access Code: [email or web address] URL: [email or web address] Date: 12/15/2023 Prepared by: Almetta Fam  Exercises - Standing March with Counter Support  - 1 x daily - 7 x weekly - 2 sets - 10 reps - Standing Hip Abduction with Counter Support  - 1 x daily - 7 x weekly - 2 sets - 10 reps - Heel Toe Raises with Counter Support  - 1 x daily - 7 x weekly - 2 sets - 10 reps - Sit to Stand  - 1 x daily - 7 x weekly - 2 sets - 10 reps  GOALS: Goals reviewed with patient? Yes  SHORT TERM GOALS: Target date: 01/26/24  Patient will be independent with initial HEP. Baseline: given  12/15/23 Goal status: IN PROGRESS 01/06/24  2.  Patient will demonstrate decreased fall risk by scoring < 20 sec on TUG with LRAD. Baseline: 31s with rolling walker Goal status: MET 24s with quad cane 12/29/23, 15s w/quad cane 01/12/24  3.  Patient will be educated on strategies to decrease risk of falls.  Baseline:  Goal status: IN PROGRESS 01/06/24   LONG TERM GOALS: Target date: 03/08/24  Patient will be independent with advanced/ongoing HEP to improve outcomes and carryover.  Baseline:  Goal status: INITIAL  2.  Patient will be able to ambulate 500' with LRAD with good safety to access community.  Baseline: using rolling walker, poor foot clearance, shuffling  Goal status: IN PROGRESS using quad cane but not safe 01/06/24  3.  Patient will demonstrate improved functional LE strength as demonstrated by 5xSTS <14s. Baseline: 20s Goal status: INITIAL  4.  Patient will demonstrate decreased fall risk by scoring < 20 sec on TUG.  Baseline: 31s with rolling walker Goal status: INITIAL   ASSESSMENT:  CLINICAL IMPRESSION: Patient returns walking with quad cane today, his safety awareness is not the best. Still requires cues to clear his feet, and sequencing with cane to avoid tripping over it with his R foot. Has met TUG goal with quad cane. Focused mostly on balance today. He needs to hold with most interventions. Has trouble with stepping over obstacles, needs cues to take big steps and not step on bars. Continue to improve his balance and strength to increase independence with safety.   EVALUATION:  Patient is a 84 y.o. male who was seen today for physical therapy evaluation and treatment for double vision and a stroke on 11/25/23. He is currently walking with a rolling walker and reports he uses his cane some in the house. He has a callus on his L foot; his daughter reports he gets them often and gets treatment for it. Patient reports once the callus is treated, he feels much better and can  walk better. He does live on his own and  mostly independent but presents as a fall risk and has decreased safety awareness. Patient will benefit from PT to address his gait and balance impairments to improve his functional independence and decrease his risk for falls and another stroke.   OBJECTIVE IMPAIRMENTS: Abnormal gait, decreased activity tolerance, decreased balance, decreased endurance, difficulty walking, decreased safety awareness, and improper body mechanics.   ACTIVITY LIMITATIONS: bending, standing, squatting, stairs, transfers, and locomotion level  PARTICIPATION LIMITATIONS: cleaning, community activity, and yard work  PERSONAL FACTORS: Age, Behavior pattern, Past/current experiences, and Transportation are also affecting patient's functional outcome.   REHAB POTENTIAL: Good  CLINICAL DECISION MAKING: Stable/uncomplicated  EVALUATION COMPLEXITY: Low  PLAN:  PT FREQUENCY: 2x/week  PT DURATION: 12 weeks  PLANNED INTERVENTIONS: 97110-Therapeutic exercises, 97530- Therapeutic activity, [id number]- Neuromuscular re-education, 97535- Self Care, [id number]- Manual therapy, [phone]- Gait training, Patient/Family education, Balance training, Stair training, DME instructions, Cryotherapy, and Moist heat  PLAN FOR NEXT SESSION: gait training, balance, LE strengthening    Almetta Fam, PT 01/12/2024, 3:30 PM

## 2024-01-22 ENCOUNTER — Ambulatory Visit

## 2024-01-22 ENCOUNTER — Ambulatory Visit: Payer: Self-pay | Admitting: Cardiology

## 2024-01-22 DIAGNOSIS — I639 Cerebral infarction, unspecified: Secondary | ICD-10-CM

## 2024-01-22 LAB — CUP PACEART REMOTE DEVICE CHECK
Date Time Interrogation Session: 20250723230918
Implantable Pulse Generator Implant Date: 20211206

## 2024-01-22 NOTE — Therapy (Signed)
 OUTPATIENT PHYSICAL THERAPY NEURO TREATMENT   Patient Name: Adrian Rodgers MRN: [id number] DOB:1940/11/12, 83 y.o., male Today's Date: 01/23/2024   PCP: Jon Berth REFERRING PROVIDER: Lonni Dalton  END OF SESSION:  PT End of Session - 01/23/24 0935     Visit Number 6    Date for PT Re-Evaluation 03/08/24    Authorization Type UHC    PT Start Time 0935    PT Stop Time 1015    PT Time Calculation (min) 40 min               Past Medical History:  Diagnosis Date   Anemia    Aortic atherosclerosis (HCC)    Cerebral aneurysm    Community acquired pneumonia of right middle lobe of lung 09/20/2017   Emphysema of lung (HCC)    Esophagitis    Gastric ulcer    H. 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)    Stroke (HCC) 05/01/2020   Tobacco dependence    Past Surgical History:  Procedure Laterality Date   BIOPSY  04/10/2022   Procedure: BIOPSY;  Surgeon: Leigh Elspeth SQUIBB, MD;  Location: [facility] ENDOSCOPY;  Service: Gastroenterology;;   ESOPHAGOGASTRODUODENOSCOPY (EGD) WITH PROPOFOL  N/A 04/10/2022   Procedure: ESOPHAGOGASTRODUODENOSCOPY (EGD) WITH PROPOFOL ;  Surgeon: Leigh Elspeth SQUIBB, MD;  Location: [facility] ENDOSCOPY;  Service: Gastroenterology;  Laterality: N/A;   HEMOSTASIS CONTROL  04/10/2022   Procedure: HEMOSTASIS CONTROL;  Surgeon: Leigh Elspeth SQUIBB, MD;  Location: [facility] ENDOSCOPY;  Service: Gastroenterology;;  purastat    implantable loop recorder placement  06/05/2020   [employer]tronic Reveal [place] model [id number] [id number] G) implantable loop recorder    Patient Active Problem List   Diagnosis Date Noted   Blurry vision, bilateral 11/25/2023   Acute ischemic stroke (HCC) 11/25/2023   Paroxysmal atrial fibrillation (HCC) 06/24/2023   Hypercoagulable state due to paroxysmal atrial fibrillation (HCC) 06/24/2023   BPH (benign prostatic hyperplasia) 12/20/2022   GERD  without esophagitis 12/20/2022   Sepsis due to pneumonia (HCC) 12/20/2022   LLL pneumonia 12/19/2022   GI bleed 04/11/2022   Upper GI bleed 04/10/2022   Acute gastric ulcer with hemorrhage 04/10/2022   Dark stools 04/09/2022   Heme positive stool 04/09/2022   Anemia 04/09/2022   Antiplatelet or antithrombotic long-term use 04/09/2022   Hypotension due to hypovolemia 04/08/2022   SIRS (systemic inflammatory response syndrome) (HCC) 04/08/2022   Acute encephalopathy 04/08/2022   Tobacco abuse 03/27/2022   Acute respiratory failure due to COVID-19 (HCC) 03/25/2022   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   Generalized weakness 03/27/2021   Ascending aortic aneurysm (HCC) 09/22/2020   COPD (chronic obstructive pulmonary disease) (HCC) 05/18/2020   Essential hypertension 05/13/2020   Dyslipidemia 05/13/2020   History of CVA (cerebrovascular accident) 05/12/2020    ONSET DATE: 11/25/23  REFERRING DIAG:  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  I63.9 (ICD-10-CM) - Acute ischemic stroke (HCC)    THERAPY DIAG:  Other lack of coordination  Other abnormalities of gait and mobility  Cerebrovascular accident (CVA), unspecified mechanism (HCC)  Rationale for Evaluation and Treatment: Rehabilitation  SUBJECTIVE:  SUBJECTIVE STATEMENT: Doing fine.    Pt accompanied by: daughter  PERTINENT HISTORY:  11/25/23 Adrian Rodgers is a 83 y.o. 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   PAIN:  Are you having pain? L foot because I have a callus    PRECAUTIONS: Fall  RED FLAGS: None   WEIGHT BEARING RESTRICTIONS: No  FALLS: Has patient fallen in last 6 months? No  LIVING ENVIRONMENT: Lives with: lives alone Lives in: House/apartment Stairs: Yes: External: 4 in front, 8 in the back steps; on right going up and can reach both Has following equipment at home: Single point cane, Quad cane small base, Walker - 2 wheeled, Environmental consultant - 4 wheeled, Wheelchair (manual), and Grab bars  PLOF: Independent with basic ADLs and Independent with household mobility without device  PATIENT GOALS: walk without assistive device, be able to pass your tests   OBJECTIVE:  Note: Objective measures were completed at Evaluation unless otherwise noted.  DIAGNOSTIC FINDINGS: MRI brain showed small pontine stroke, correlates well to INO.  Dilated by neurology, suspected small vessel disease due to smoking, hypercholesterolemia. - CTA head and neck unremarkable, echocardiogram with PFO, likely unrelated  COGNITION: Overall cognitive status: Within functional limits for tasks assessed   SENSATION: WFL   POSTURE: rounded shoulders and forward head  LOWER EXTREMITY ROM:  WFL    LOWER EXTREMITY MMT:  grossly 4+/5    BED MOBILITY:  Not tested  TRANSFERS: Sit to stand: Complete Independence  Assistive device utilized: None     Stand to sit: Complete Independence  Assistive device utilized: None     Chair to chair: Modified independence and SBA  Assistive device utilized: Environmental consultant - 4 wheeled        CURB:  Not tested  STAIRS: Findings: Level of Assistance: Modified independence, Stair Negotiation Technique: Step to Pattern with Single Rail on Right Bilateral Rails, Number of Stairs: 4 in front and 8 in the front, and Height of Stairs: 6     GAIT: Findings: Gait Characteristics: step to pattern, decreased stride length, shuffling, narrow BOS, poor foot clearance- Right, and poor foot clearance- Left, Distance walked: in clinic distances,  Assistive device utilized:Walker - 4 wheeled, and Level of assistance: Modified independence  FUNCTIONAL TESTS:  5 times sit to stand: 20s from chair  Timed up and go (TUG): 31s with rolling walker                                                                                                                               TREATMENT DATE:  01/23/24 STS with chest press 2x10 NuStep L5 x23mins Standing rows and ext green in front of mat 2x10 Step ups 6 Step up on airex forward and side ways Leg press 20# 2x10  01/12/24 NuStep L5x90mins  TUG with cane STS on airex 2x10 Step ups 6  Marching on airex  Side steps over obstacles Feet together on airex then with eyes closed   01/06/24 NuStep L5x63mins Leg ext 10# 2x10 HS curls 20# 2x10 Step ups 4 Calf raises 2x10 Side steps on airex Cone taps  Walking on beam  12/29/23 NuStep L5x75mins  Seated hip abd green 2x10 Ball squeeze 2x10 STS with OHP 2x10 2# marching, hip abd, hip ext, HS curls standing in bars 2x10 Side steps on beam TUG- 24s w/quad cane  12/22/23 NuStep L5x45mins  LAQ 2# 2x10 2# marching with walker  HS curls green 2x10 STS 2x10 Seated heel to toe raises 2x10 Step taps 4 Step ups 4  12/15/23- EVAL    PATIENT EDUCATION: Education details: POC, HEP, fall risk, stroke education  Person educated: Patient and Child(ren) Education method: Explanation and Demonstration Education comprehension: verbalized understanding and returned demonstration  HOME EXERCISE PROGRAM: Access Code: [email or web address] URL: [email or web address] Date: 12/15/2023 Prepared by: Almetta Fam  Exercises - Standing March with Counter Support  - 1 x daily - 7 x weekly - 2 sets - 10 reps - Standing Hip Abduction with Counter Support  - 1 x daily - 7 x weekly - 2 sets - 10 reps - Heel Toe Raises with Counter Support  - 1 x daily - 7 x weekly - 2 sets - 10 reps - Sit to Stand  - 1 x daily - 7 x weekly - 2 sets - 10  reps  GOALS: Goals reviewed with patient? Yes  SHORT TERM GOALS: Target date: 01/26/24  Patient will be independent with initial HEP. Baseline: given 12/15/23 Goal status: IN PROGRESS 01/06/24  2.  Patient will demonstrate decreased fall risk by scoring < 20 sec on TUG with LRAD. Baseline: 31s with rolling walker Goal status: MET 24s with quad cane 12/29/23, 15s w/quad cane 01/12/24  3.  Patient will be educated on strategies to decrease risk of falls.  Baseline:  Goal status: IN PROGRESS 01/06/24   LONG TERM GOALS: Target date: 03/08/24  Patient will be independent with advanced/ongoing HEP to improve outcomes and carryover.  Baseline:  Goal status: INITIAL  2.  Patient will be able to ambulate 500' with LRAD with good safety to access community.  Baseline: using rolling walker, poor foot clearance, shuffling  Goal status: IN PROGRESS using quad cane but not safe 01/06/24  3.  Patient will demonstrate improved functional LE strength as demonstrated by 5xSTS <14s. Baseline: 20s Goal status: INITIAL  4.  Patient will demonstrate decreased fall risk by scoring < 20 sec on TUG.  Baseline: 31s with rolling walker Goal status: INITIAL   ASSESSMENT:  CLINICAL IMPRESSION: Patient returns walking with quad cane today, his safety awareness is not the best. He still shuffling his feet and needs cues to pick them up to prevent tripping. Some fatigue with standing activities today. He needs support with step ups and step ups on airex.   Has trouble with stepping over obstacles, needs cues to take big steps and not step on bars. Continue to improve his balance and strength to increase independence with safety.   EVALUATION:  Patient is a 83 y.o. male who was seen today for physical therapy evaluation and treatment for double vision and a stroke on 11/25/23. He is currently walking with a rolling walker and reports he uses his cane some in the house. He has a callus on his L foot; his daughter  reports he  gets them often and gets treatment for it. Patient reports once the callus is treated, he feels much better and can walk better. He does live on his own and mostly independent but presents as a fall risk and has decreased safety awareness. Patient will benefit from PT to address his gait and balance impairments to improve his functional independence and decrease his risk for falls and another stroke.   OBJECTIVE IMPAIRMENTS: Abnormal gait, decreased activity tolerance, decreased balance, decreased endurance, difficulty walking, decreased safety awareness, and improper body mechanics.   ACTIVITY LIMITATIONS: bending, standing, squatting, stairs, transfers, and locomotion level  PARTICIPATION LIMITATIONS: cleaning, community activity, and yard work  PERSONAL FACTORS: Age, Behavior pattern, Past/current experiences, and Transportation are also affecting patient's functional outcome.   REHAB POTENTIAL: Good  CLINICAL DECISION MAKING: Stable/uncomplicated  EVALUATION COMPLEXITY: Low  PLAN:  PT FREQUENCY: 2x/week  PT DURATION: 12 weeks  PLANNED INTERVENTIONS: 97110-Therapeutic exercises, 97530- Therapeutic activity, 97112- Neuromuscular re-education, 97535- Self Care, 02859- Manual therapy, [phone]- Gait training, Patient/Family education, Balance training, Stair training, DME instructions, Cryotherapy, and Moist heat  PLAN FOR NEXT SESSION: gait training, balance, LE strengthening    Siler Mavis, PT 01/23/2024, 10:15 AM

## 2024-01-22 NOTE — Therapy (Signed)
 OUTPATIENT OCCUPATIONAL THERAPY NEURO Treatment  Patient Name: Adrian Rodgers MRN: [id number] DOB:1941-03-30, 83 y.o., male Today's Date: 01/23/2024  PCP: Jon Berth NP REFERRING PROVIDER: Dr. Jonel  END OF SESSION:  OT End of Session - 01/23/24 1026     Visit Number 8    Number of Visits 17    Date for OT Re-Evaluation 02/16/24    Authorization Type U[employer] Medicare    Authorization - Visit Number 8    Progress Note Due on Visit 10    OT Start Time 1024    OT Stop Time 1103    OT Time Calculation (min) 39 min    Activity Tolerance Patient tolerated treatment well    Behavior During Therapy [facility] for tasks assessed/performed               Past Medical History:  Diagnosis Date   Anemia    Aortic atherosclerosis (HCC)    Cerebral aneurysm    Community acquired pneumonia of right middle lobe of lung 09/20/2017   Emphysema of lung (HCC)    Esophagitis    Gastric ulcer    H. 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)    Stroke (HCC) 05/01/2020   Tobacco dependence    Past Surgical History:  Procedure Laterality Date   BIOPSY  04/10/2022   Procedure: BIOPSY;  Surgeon: Leigh Elspeth SQUIBB, MD;  Location: [facility] ENDOSCOPY;  Service: Gastroenterology;;   ESOPHAGOGASTRODUODENOSCOPY (EGD) WITH PROPOFOL  N/A 04/10/2022   Procedure: ESOPHAGOGASTRODUODENOSCOPY (EGD) WITH PROPOFOL ;  Surgeon: Leigh Elspeth SQUIBB, MD;  Location: [facility] ENDOSCOPY;  Service: Gastroenterology;  Laterality: N/A;   HEMOSTASIS CONTROL  04/10/2022   Procedure: HEMOSTASIS CONTROL;  Surgeon: Leigh Elspeth SQUIBB, MD;  Location: [facility] ENDOSCOPY;  Service: Gastroenterology;;  purastat    implantable loop recorder placement  06/05/2020   Medtronic Reveal [place] model [phone]) implantable loop recorder    Patient Active Problem List   Diagnosis Date Noted   Blurry vision, bilateral 11/25/2023   Acute ischemic  stroke (HCC) 11/25/2023   Paroxysmal atrial fibrillation (HCC) 06/24/2023   Hypercoagulable state due to paroxysmal atrial fibrillation (HCC) 06/24/2023   BPH (benign prostatic hyperplasia) 12/20/2022   GERD without esophagitis 12/20/2022   Sepsis due to pneumonia (HCC) 12/20/2022   LLL pneumonia 12/19/2022   GI bleed 04/11/2022   Upper GI bleed 04/10/2022   Acute gastric ulcer with hemorrhage 04/10/2022   Dark stools 04/09/2022   Heme positive stool 04/09/2022   Anemia 04/09/2022   Antiplatelet or antithrombotic long-term use 04/09/2022   Hypotension due to hypovolemia 04/08/2022   SIRS (systemic inflammatory response syndrome) (HCC) 04/08/2022   Acute encephalopathy 04/08/2022   Tobacco abuse 03/27/2022   Acute respiratory failure due to COVID-19 (HCC) 03/25/2022   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   Generalized weakness 03/27/2021   Ascending aortic aneurysm (HCC) 09/22/2020   COPD (chronic obstructive pulmonary disease) (HCC) 05/18/2020   Essential hypertension 05/13/2020   Dyslipidemia 05/13/2020   History of CVA (cerebrovascular accident) 05/12/2020    ONSET DATE: 11/25/23  REFERRING DIAG:  Diagnosis  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  Pt accompanied by: self   PERTINENT HISTORY: Pt is a 83 year old male transferred f from [facility] 11/25/23 due to experience double vision in his L eye. CT negative. 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 PMH significant for hyperlipidemia, gastric ulcer, H. pylori, hypertension, CVA, PNA and tobacco abuse.   PRECAUTIONS: Fall  WEIGHT BEARING RESTRICTIONS: No  PAIN: none  FALLS: Has patient fallen in last 6 months? No  LIVING ENVIRONMENT: Lives with: lives alone Lives in: House/apartment Stairs: No Has following equipment at home: Quad cane small base and grab bar a tub  PLOF: Independent  PATIENT GOALS: improve vision  OBJECTIVE:  Note: Objective measures were completed at Evaluation unless otherwise noted.  HAND DOMINANCE: Right  ADLs: Overall ADLs: mod I with all basic ADLS. Transfers/ambulation related to ADLs:mod I with walker  IADLs:  Light housekeeping: has not attempted yet Meal Prep: Pt's dtr has been preparing meals and he has been warming in the microwave. Community mobility: supervision with small based quad cane, Pt has walker and he practiced using in clinic with improved safety. Medication management: dtr fills pillbox, alexa reminds pt to take meds Financial management: Pt's dtr handles   MOBILITY STATUS: decreased balance uses quad cane or walker  FUNCTIONAL OUTCOME MEASURES: 5M number cancellation- 88% accuracy  UPPER EXTREMITY ROM:  WFLS   UPPER EXTREMITY MMT:   bilateral UE strength is grossly 4+/5   HAND FUNCTION: Grip strength: Right: 35 lbs; Left: 30 lbs  COORDINATION: 9 Hole Peg test: Right: 41.71 sec; Left: 35.66 sec  SENSATION: WFL  COGNITION: Overall cognitive status: Impaired, deceased short term memory, spells WORLD backwards   VISION: Subjective report: Pt reports double vision  VISION ASSESSMENT:  L eye does not track to right side Impaired vision Eye alignment: 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 Pt was unsteady ambulating with quad cane. He has a walker at home, and he used the walker to walkt to the waiting room with better balance. Pt was encouraged to use his walker for safety.                                                                                                                            TREATMENT DATE:  01/23/24:   Completing 12-piece puzzle with mod difficulty/cueing for visual-perceptual skills and problem solving.    Constant therapy:  Symbol Match level 5 with 93% accuracy and 52.02sec average response  time.  Then level 7 with 87% accuracy and 35.93 sec average for visual scanning.  Environmental Scanning/navigation: (ambulating) in min-mod distracting environment with 12/15 accuracy = 80% on first pass. Pt able to locate 3 remaining items on second pass with min-mod cueing.  Discussed errors (all missed items on R side) and need to perform head turns/scan further to the R to compensate for visual deficits.  No LOB to retrieve items or ambulate.   01/12/24- Pipe tree design, x 2 reps for visual skills with a cognitve component, modv.c and difficulty for organization and reconizing pieces to copy design. Increased time required. Pt brought in his reading glassses however the lns kept falling out so he was unable to use.  Reviewed memory strategies. UBE x 5 mins level 1 for conditioning  01/06/24-UBE x 6 mins level 2 for conditoning copying small peg design with RUE for vision and cognition as well as fine motor coordination, pt completed design with 100% accuracy. Environmental scanning: 14/15 located first pass, only 1 items missed. Reviewed memory and visual compensation strategies, pt verbalized understanding.  12/29/23 Reviewed  vision HEP, 5 reps each, eyes individually then together min v.c  Pt with improved performance today and less diplopia. Finding same symbols level 2 on constant therapy, 99% accuracy Placing grooved pegs into pegboard with RUE for increased fine motor coordination, min difficulty/ v.c  Removing pegs with in hand manipulation, mod difficulty/ drops Pt demonstrates difficulty with in hand manipulation. completing a 12 piece puzzle for cognitve and visual skills, mod v.c and increased time required. RUE 9 hole peg test: 26.70  12/22/23- Pt arrived late 24M number cancellation task without partial occlusion with 100% accuracy. copying picture Orthoptist with RUE for visual skills as well as fine motor coordination, min difficulty manipulating pieces with RUE, design was copied correctly with only 1 v.c. to double check Memory compensations were issued and reviewed. Pt verbalized understanding.     PATIENT EDUCATION: Education details:  see above Person educated: Patient,  Education method: Explanation,  Education comprehension: verbalized understanding,   HOME EXERCISE PROGRAM: 12/04/23- diplopia HEP, coordinationHEP- beginning 12/22/23- memory compensaitons  GOALS: Goals reviewed with patient? Yes  SHORT TERM GOALS: Target date: 12/31/23  I with inital HEP for vision. Goal status:  met, 01/06/24  2.  I with compensatory strategies for visual deficits. Goal status:met, 01/06/24  3.  Pt will perform tabletop scanning activities with 90% or better accuracy, using compensations prn. Goal status:  ongoing, completed small peg design correctly today 01/06/24  4.  Pt will navigate a busy environment, locating items with 75% accuracy without LOB  Goal status: met, 14/15 located first pass 01/06/24  5.  I with memory compensations Goal status: met, pt verbalizes understanding 01/12/24    LONG TERM GOALS: Target date: 02/02/24  I with updated HEP Goal status:met 12/29/23- pt returned demonstration  of vision HEP  2.  Pt will demonstrate improved RUE fine motor coordination as evidenced by decreasing 9 hole peg test score to 35 secs or less. Goal status: 26.70, met, 12/29/23- however pt with difficulty with in hand manipulation  3.  Pt will perform environmental scanning activities with 90% or better accuracy, using compensations prn,  without LOB Goal status: ongoing, 14/15 located today 01/06/24  4.  Pt will demonstrate ability to read a page of newsprint without partial occulsion and minimal reports of diplopia. Goal status: ongoing still some reports of diplopia 01/12/24   ASSESSMENT:  CLINICAL IMPRESSION: Pt is progressing  towards goals.  Pt's diplopia is improving with no diplopia reported today with activities, but noted that pt does not scan as far to R side.  Pt reports diplopia the further he looks to the R.  Pt missed items on R side with environmental scanning today and was cued to turn head.   PERFORMANCE DEFICITS: in functional skills including ADLs, IADLs, coordination, dexterity, Fine motor control, mobility, balance, endurance, decreased knowledge of precautions, vision, and UE functional use, cognitive skills including memory, problem solving, and safety awareness, and psychosocial skills including coping strategies, environmental adaptation, habits, interpersonal interactions, and routines and behaviors.   IMPAIRMENTS: are limiting patient from ADLs, IADLs, play, leisure, and social participation.   CO-MORBIDITIES: may have co-morbidities  that affects occupational performance. Patient will benefit from skilled OT to address above impairments and improve overall function.  MODIFICATION OR ASSISTANCE TO COMPLETE EVALUATION: Min-Moderate modification of tasks or assist with assess necessary to complete an evaluation.  OT OCCUPATIONAL PROFILE AND HISTORY: Detailed assessment: Review of records and additional review of physical, cognitive, psychosocial history related to  current functional performance.  CLINICAL DECISION MAKING: LOW - limited treatment options, no task modification necessary  REHAB POTENTIAL: Good  EVALUATION COMPLEXITY: Low    PLAN:  OT FREQUENCY: 16 visits  OT DURATION: 10 weeks  PLANNED INTERVENTIONS: 97168 OT Re-evaluation, 97535 self care/ADL training, 02889 therapeutic exercise, 97530 therapeutic activity, 97112 neuromuscular re-education, 97140 manual therapy, 97116 gait training, 02964 ultrasound, 97018 paraffin, 02989 moist heat, 97010 cryotherapy, 97129 Cognitive training (first 15 min), 02869 Cognitive training(each additional 15 min), passive range of motion, balance training, functional mobility training, visual/perceptual remediation/compensation, energy conservation, coping strategies training, patient/family education, and DME and/or AE instructions  RECOMMENDED OTHER SERVICES: PT  CONSULTED AND AGREED WITH PLAN OF CARE: Patient and family member/caregiver  PLAN FOR NEXT SESSION:  continue to address tabletop scanning, environmental scanning   Saanvika Vazques, OTR/L 01/23/2024, 11:09 AM

## 2024-01-23 ENCOUNTER — Ambulatory Visit

## 2024-01-23 ENCOUNTER — Encounter: Payer: Self-pay | Admitting: Occupational Therapy

## 2024-01-23 ENCOUNTER — Ambulatory Visit: Admitting: Occupational Therapy

## 2024-01-23 DIAGNOSIS — R2689 Other abnormalities of gait and mobility: Secondary | ICD-10-CM

## 2024-01-23 DIAGNOSIS — R278 Other lack of coordination: Secondary | ICD-10-CM | POA: Diagnosis not present

## 2024-01-23 DIAGNOSIS — R41842 Visuospatial deficit: Secondary | ICD-10-CM

## 2024-01-23 DIAGNOSIS — R41844 Frontal lobe and executive function deficit: Secondary | ICD-10-CM

## 2024-01-23 DIAGNOSIS — R41840 Attention and concentration deficit: Secondary | ICD-10-CM

## 2024-01-23 DIAGNOSIS — I639 Cerebral infarction, unspecified: Secondary | ICD-10-CM

## 2024-01-26 NOTE — Therapy (Signed)
 OUTPATIENT PHYSICAL THERAPY NEURO TREATMENT   Patient Name: Adrian Rodgers MRN: [id number] DOB:07/08/1940, 83 y.o., male Today's Date: 01/27/2024   PCP: Jon Berth REFERRING PROVIDER: Lonni Dalton  END OF SESSION:  PT End of Session - 01/27/24 1458     Visit Number 7    Date for PT Re-Evaluation 03/08/24    Authorization Type UHC    PT Start Time 1458    PT Stop Time 1540    PT Time Calculation (min) 42 min                Past Medical History:  Diagnosis Date   Anemia    Aortic atherosclerosis (HCC)    Cerebral aneurysm    Community acquired pneumonia of right middle lobe of lung 09/20/2017   Emphysema of lung (HCC)    Esophagitis    Gastric ulcer    H. 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 ENDOSCOPY;  Service: Gastroenterology;  Laterality: N/A;   HEMOSTASIS CONTROL  04/10/2022   Procedure: HEMOSTASIS CONTROL;  Surgeon: Leigh Elspeth SQUIBB, MD;  Location: [facility] ENDOSCOPY;  Service: Gastroenterology;;  purastat    implantable loop recorder placement  06/05/2020   Medtronic Reveal [place] model [id number]) implantable loop recorder    Patient Active Problem List   Diagnosis Date Noted   Blurry vision, bilateral 11/25/2023   Acute ischemic stroke (HCC) 11/25/2023   Paroxysmal atrial fibrillation (HCC) 06/24/2023   Hypercoagulable state due to paroxysmal atrial fibrillation (HCC) 06/24/2023   BPH (benign prostatic hyperplasia) 12/20/2022   GERD  without esophagitis 12/20/2022   Sepsis due to pneumonia (HCC) 12/20/2022   LLL pneumonia 12/19/2022   GI bleed 04/11/2022   Upper GI bleed 04/10/2022   Acute gastric ulcer with hemorrhage 04/10/2022   Dark stools 04/09/2022   Heme positive stool 04/09/2022   Anemia 04/09/2022   Antiplatelet or antithrombotic long-term use 04/09/2022   Hypotension due to hypovolemia 04/08/2022   SIRS (systemic inflammatory response syndrome) (HCC) 04/08/2022   Acute encephalopathy 04/08/2022   Tobacco abuse 03/27/2022   Acute respiratory failure due to COVID-19 (HCC) 03/25/2022   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   Generalized weakness 03/27/2021   Ascending aortic aneurysm (HCC) 09/22/2020   COPD (chronic obstructive pulmonary disease) (HCC) 05/18/2020   Essential hypertension 05/13/2020   Dyslipidemia 05/13/2020   History of CVA (cerebrovascular accident) 05/12/2020    ONSET DATE: 11/25/23  REFERRING DIAG:  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    Pt accompanied by: daughter  PERTINENT HISTORY:  11/25/23 Adrian Rodgers is a 83 y.o. 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   PAIN:  Are you having pain? L foot  because I have a callus   PRECAUTIONS: Fall  RED FLAGS: None   WEIGHT BEARING RESTRICTIONS: No  FALLS: Has patient fallen in last 6 months? No  LIVING ENVIRONMENT: Lives with: lives alone Lives in: House/apartment Stairs: Yes: External: 4 in front, 8 in the back steps; on right going up and can reach both Has following equipment at home: Single point cane, Quad cane small base, Walker - 2 wheeled, Environmental consultant - 4 wheeled, Wheelchair (manual), and Grab bars  PLOF: Independent with basic ADLs and Independent with household mobility without device  PATIENT GOALS: walk without assistive device, be able to pass your tests   OBJECTIVE:  Note: Objective measures were completed at Evaluation unless otherwise noted.  DIAGNOSTIC FINDINGS: MRI brain showed small pontine stroke, correlates well to INO.  Dilated by neurology, suspected small vessel disease due to smoking, hypercholesterolemia. - CTA head and neck unremarkable, echocardiogram with PFO, likely unrelated  COGNITION: Overall cognitive status: Within functional limits for tasks assessed   SENSATION: WFL   POSTURE: rounded shoulders and forward head  LOWER EXTREMITY ROM:  WFL    LOWER EXTREMITY MMT:  grossly 4+/5    BED MOBILITY:  Not tested  TRANSFERS: Sit to stand: Complete Independence  Assistive device utilized: None     Stand to sit: Complete Independence  Assistive device utilized: None     Chair to chair: Modified independence and SBA  Assistive device utilized: Environmental consultant - 4 wheeled        CURB:  Not tested  STAIRS: Findings: Level of Assistance: Modified independence, Stair Negotiation Technique: Step to Pattern with Single Rail on Right Bilateral Rails, Number of Stairs: 4 in front and 8 in the front, and Height of Stairs: 6     GAIT: Findings: Gait Characteristics: step to pattern, decreased stride length, shuffling, narrow BOS, poor foot clearance- Right, and poor foot clearance- Left, Distance walked: in  clinic distances, Assistive device utilized:Walker - 4 wheeled, and Level of assistance: Modified independence  FUNCTIONAL TESTS:  5 times sit to stand: 20s from chair  Timed up and go (TUG): 31s with rolling walker                                                                                                                               TREATMENT DATE:  01/27/24 NuStep L5x3mins  5xSTS- 15s TUG w/o AD- 17.42s Leg ext 10# 2x10 HS curls 25# 2x10 STS on airex 2x10 On airex catch  Calf raises 2x12  Shoulder ext green 2x10  01/23/24 STS with  chest press 2x10 NuStep L5 x27mins Standing rows and ext green in front of mat 2x10 Step ups 6 Step up on airex forward and side ways Leg press 20# 2x10  01/12/24 NuStep L5x75mins TUG with cane STS on airex 2x10 Step ups 6  Marching on airex  Side steps over obstacles Feet together on airex then with eyes closed   01/06/24 NuStep L5x27mins Leg ext 10# 2x10 HS curls 20# 2x10 Step ups 4 Calf raises 2x10 Side steps on airex Cone taps  Walking on beam  12/29/23 NuStep L5x51mins  Seated hip abd green 2x10 Ball squeeze 2x10 STS with OHP 2x10 2# marching, hip abd, hip ext, HS curls standing in bars 2x10 Side steps on beam TUG- 24s w/quad cane  12/22/23 NuStep L5x80mins  LAQ 2# 2x10 2# marching with walker  HS curls green 2x10 STS 2x10 Seated heel to toe raises 2x10 Step taps 4 Step ups 4  12/15/23- EVAL    PATIENT EDUCATION: Education details: POC, HEP, fall risk, stroke education  Person educated: Patient and Child(ren) Education method: Explanation and Demonstration Education comprehension: verbalized understanding and returned demonstration  HOME EXERCISE PROGRAM: Access Code: [email or web address] URL: [email or web address] Date: 12/15/2023 Prepared by: Almetta Fam  Exercises - Standing March with Counter Support  - 1 x daily - 7 x weekly - 2 sets - 10 reps - Standing Hip Abduction with Counter Support  - 1 x  daily - 7 x weekly - 2 sets - 10 reps - Heel Toe Raises with Counter Support  - 1 x daily - 7 x weekly - 2 sets - 10 reps - Sit to Stand  - 1 x daily - 7 x weekly - 2 sets - 10 reps  GOALS: Goals reviewed with patient? Yes  SHORT TERM GOALS: Target date: 01/26/24  Patient will be independent with initial HEP. Baseline: given 12/15/23 Goal status: IN PROGRESS 01/06/24, MET 01/27/24  2.  Patient will demonstrate decreased fall risk by scoring < 20 sec on TUG with LRAD. Baseline: 31s with rolling walker Goal status: MET 24s with quad cane 12/29/23, 15s w/quad cane 01/12/24  3.  Patient will be educated on strategies to decrease risk of falls.  Baseline:  Goal status: IN PROGRESS 01/06/24   LONG TERM GOALS: Target date: 03/08/24  Patient will be independent with advanced/ongoing HEP to improve outcomes and carryover.  Baseline:  Goal status: INITIAL  2.  Patient will be able to ambulate 500' with LRAD with good safety to access community.  Baseline: using rolling walker, poor foot clearance, shuffling  Goal status: IN PROGRESS using quad cane but not safe 01/06/24  3.  Patient will demonstrate improved functional LE strength as demonstrated by 5xSTS <14s. Baseline: 20s Goal status: IN PROGRESS 15s 01/27/24  4.  Patient will demonstrate decreased fall risk by scoring < 20 sec on TUG.  Baseline: 31s with rolling walker Goal status: 17.42s MET 01/27/24   ASSESSMENT:  CLINICAL IMPRESSION: Patient returns walking with quad cane today, his safety awareness is not the best. He still shuffles his feet. He has made good progress towards his goals, but still needs to continue to improve his balance and strength to increase independence with safety.   EVALUATION:  Patient is a 83 y.o. male who was seen today for physical therapy evaluation and treatment for double vision and a stroke on 11/25/23. He is currently walking with a rolling walker and reports he uses his cane some in the house. He has a  callus  on his L foot; his daughter reports he gets them often and gets treatment for it. Patient reports once the callus is treated, he feels much better and can walk better. He does live on his own and mostly independent but presents as a fall risk and has decreased safety awareness. Patient will benefit from PT to address his gait and balance impairments to improve his functional independence and decrease his risk for falls and another stroke.   OBJECTIVE IMPAIRMENTS: Abnormal gait, decreased activity tolerance, decreased balance, decreased endurance, difficulty walking, decreased safety awareness, and improper body mechanics.   ACTIVITY LIMITATIONS: bending, standing, squatting, stairs, transfers, and locomotion level  PARTICIPATION LIMITATIONS: cleaning, community activity, and yard work  PERSONAL FACTORS: Age, Behavior pattern, Past/current experiences, and Transportation are also affecting patient's functional outcome.   REHAB POTENTIAL: Good  CLINICAL DECISION MAKING: Stable/uncomplicated  EVALUATION COMPLEXITY: Low  PLAN:  PT FREQUENCY: 2x/week  PT DURATION: 12 weeks  PLANNED INTERVENTIONS: 97110-Therapeutic exercises, 97530- Therapeutic activity, 97112- Neuromuscular re-education, 97535- Self Care, 02859- Manual therapy, [phone]- Gait training, Patient/Family education, Balance training, Stair training, DME instructions, Cryotherapy, and Moist heat  PLAN FOR NEXT SESSION: gait training, balance, LE strengthening    Almetta Fam, PT 01/27/2024, 3:52 PM

## 2024-01-27 ENCOUNTER — Ambulatory Visit

## 2024-01-27 ENCOUNTER — Ambulatory Visit: Admitting: Occupational Therapy

## 2024-01-27 ENCOUNTER — Encounter: Payer: Self-pay | Admitting: Occupational Therapy

## 2024-01-27 DIAGNOSIS — R278 Other lack of coordination: Secondary | ICD-10-CM | POA: Diagnosis not present

## 2024-01-27 DIAGNOSIS — R2689 Other abnormalities of gait and mobility: Secondary | ICD-10-CM

## 2024-01-27 DIAGNOSIS — I639 Cerebral infarction, unspecified: Secondary | ICD-10-CM

## 2024-01-27 DIAGNOSIS — R41844 Frontal lobe and executive function deficit: Secondary | ICD-10-CM

## 2024-01-27 DIAGNOSIS — R41840 Attention and concentration deficit: Secondary | ICD-10-CM

## 2024-01-27 DIAGNOSIS — R41842 Visuospatial deficit: Secondary | ICD-10-CM

## 2024-01-27 DIAGNOSIS — H532 Diplopia: Secondary | ICD-10-CM

## 2024-01-27 NOTE — Therapy (Signed)
 OUTPATIENT OCCUPATIONAL THERAPY NEURO Treatment  Patient Name: Adrian Rodgers MRN: [id number] DOB:Jan 16, 1941, 83 y.o., male Today's Date: 01/27/2024  PCP: Jon Berth NP REFERRING PROVIDER: Dr. Jonel  END OF SESSION:  OT End of Session - 01/27/24 1408     Visit Number 9    Number of Visits 17    Date for OT Re-Evaluation 02/16/24    Authorization Type U[employer] Medicare    Authorization - Visit Number 9    Progress Note Due on Visit 10    OT Start Time 1405    OT Stop Time 1445    OT Time Calculation (min) 40 min    Activity Tolerance Patient tolerated treatment well    Behavior During Therapy [facility] for tasks assessed/performed               Past Medical History:  Diagnosis Date   Anemia    Aortic atherosclerosis (HCC)    Cerebral aneurysm    Community acquired pneumonia of right middle lobe of lung 09/20/2017   Emphysema of lung (HCC)    Esophagitis    Gastric ulcer    H. 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)    Stroke (HCC) 05/01/2020   Tobacco dependence    Past Surgical History:  Procedure Laterality Date   BIOPSY  04/10/2022   Procedure: BIOPSY;  Surgeon: Leigh Elspeth SQUIBB, MD;  Location: [facility] ENDOSCOPY;  Service: Gastroenterology;;   ESOPHAGOGASTRODUODENOSCOPY (EGD) WITH PROPOFOL  N/A 04/10/2022   Procedure: ESOPHAGOGASTRODUODENOSCOPY (EGD) WITH PROPOFOL ;  Surgeon: Leigh Elspeth SQUIBB, MD;  Location: [facility] ENDOSCOPY;  Service: Gastroenterology;  Laterality: N/A;   HEMOSTASIS CONTROL  04/10/2022   Procedure: HEMOSTASIS CONTROL;  Surgeon: Leigh Elspeth SQUIBB, MD;  Location: [facility] ENDOSCOPY;  Service: Gastroenterology;;  purastat    implantable loop recorder placement  06/05/2020   Medtronic Reveal [place] model [id number]) implantable loop recorder    Patient Active Problem List   Diagnosis Date Noted   Blurry vision, bilateral 11/25/2023   Acute ischemic  stroke (HCC) 11/25/2023   Paroxysmal atrial fibrillation (HCC) 06/24/2023   Hypercoagulable state due to paroxysmal atrial fibrillation (HCC) 06/24/2023   BPH (benign prostatic hyperplasia) 12/20/2022   GERD without esophagitis 12/20/2022   Sepsis due to pneumonia (HCC) 12/20/2022   LLL pneumonia 12/19/2022   GI bleed 04/11/2022   Upper GI bleed 04/10/2022   Acute gastric ulcer with hemorrhage 04/10/2022   Dark stools 04/09/2022   Heme positive stool 04/09/2022   Anemia 04/09/2022   Antiplatelet or antithrombotic long-term use 04/09/2022   Hypotension due to hypovolemia 04/08/2022   SIRS (systemic inflammatory response syndrome) (HCC) 04/08/2022   Acute encephalopathy 04/08/2022   Tobacco abuse 03/27/2022   Acute respiratory failure due to COVID-19 (HCC) 03/25/2022   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   Generalized weakness 03/27/2021   Ascending aortic aneurysm (HCC) 09/22/2020   COPD (chronic obstructive pulmonary disease) (HCC) 05/18/2020   Essential hypertension 05/13/2020   Dyslipidemia 05/13/2020   History of CVA (cerebrovascular accident) 05/12/2020    ONSET DATE: 11/25/23  REFERRING DIAG:  Diagnosis  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  Pt accompanied by: self   PERTINENT HISTORY: Pt is a 83 year old male transferred f from [facility] 11/25/23 due to experience double vision in his L eye. CT negative. MRI showed subcentimeter acute infarct at the left dorsal midbrain with query of involvement of the left medial  longitudinal fasciculus. PMH significant for hyperlipidemia, gastric ulcer, H. pylori, hypertension, CVA, PNA and tobacco abuse.   PRECAUTIONS: Fall  WEIGHT BEARING RESTRICTIONS: No  PAIN: none  FALLS: Has patient fallen in last 6 months? No  LIVING ENVIRONMENT: Lives with: lives alone Lives in: House/apartment Stairs: No Has following equipment at home: Quad cane small base and grab bar a tub  PLOF: Independent  PATIENT GOALS: improve vision  OBJECTIVE:  Note: Objective measures were completed at Evaluation unless otherwise noted.  HAND DOMINANCE: Right  ADLs: Overall ADLs: mod I with all basic ADLS. Transfers/ambulation related to ADLs:mod I with walker  IADLs:  Light housekeeping: has not attempted yet Meal Prep: Pt's dtr has been preparing meals and he has been warming in the microwave. Community mobility: supervision with small based quad cane, Pt has walker and he practiced using in clinic with improved safety. Medication management: dtr fills pillbox, alexa reminds pt to take meds Financial management: Pt's dtr handles   MOBILITY STATUS: decreased balance uses quad cane or walker  FUNCTIONAL OUTCOME MEASURES: 40M number cancellation- 88% accuracy  UPPER EXTREMITY ROM:  WFLS   UPPER EXTREMITY MMT:   bilateral UE strength is grossly 4+/5   HAND FUNCTION: Grip strength: Right: 35 lbs; Left: 30 lbs  COORDINATION: 9 Hole Peg test: Right: 41.71 sec; Left: 35.66 sec  SENSATION: WFL  COGNITION: Overall cognitive status: Impaired, deceased short term memory, spells WORLD backwards   VISION: Subjective report: Pt reports double vision  VISION ASSESSMENT:  L eye does not track to right side Impaired vision Eye alignment: 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 Pt was unsteady ambulating with quad cane. He has a walker at home, and he used the walker to walkt to the waiting room with better balance. Pt was encouraged to use his walker for safety.                                                                                                                            TREATMENT DATE:01/27/24- 47M number cancellation task wearing reading glasses-100% accuracy symbol match level 7 on constant therapy: 83%( multiple screens) Reading comprehension task to answer questions for scanning and memory,  level  1- 83% accuracy as pt had difficulty navigating I-pad despite min-mod v.c. However pt reports no double vision when reading for task. Increased time and v.c required.   01/23/24:   Completing 12-piece puzzle with mod difficulty/cueing for visual-perceptual skills and problem solving.    Constant therapy:  Symbol Match level 5 with 93% accuracy and 52.02sec average response time.  Then level 7 with 87% accuracy and 35.93 sec average for visual scanning.  Environmental Scanning/navigation: (ambulating) in min-mod distracting environment with 12/15 accuracy = 80% on first pass. Pt able to locate 3 remaining items on second pass with min-mod cueing.  Discussed errors (all missed items on R side) and need to perform head turns/scan further to the R to compensate for visual deficits.  No LOB to retrieve items or ambulate.   01/12/24- Pipe tree design, x 2 reps for visual skills with a cognitve component, modv.c and difficulty for organization and reconizing pieces to copy design. Increased time required. Pt brought in his reading glassses however the lns kept falling out so he was unable to use.  Reviewed memory  strategies. UBE x 5 mins level 1 for conditioning  01/06/24-UBE x 6 mins level 2 for conditoning copying small peg design with RUE for vision and cognition as well as fine motor coordination, pt completed design with 100% accuracy. Environmental scanning: 14/15 located first pass, only 1 items missed. Reviewed memory and visual compensation strategies, pt verbalized understanding.  Person educated: Patient,  Education method: Explanation,  Education comprehension: verbalized understanding,   HOME EXERCISE PROGRAM: 12/04/23- diplopia HEP, coordinationHEP- beginning 12/22/23- memory compensaitons  GOALS: Goals reviewed with patient? Yes  SHORT TERM GOALS: Target date: 12/31/23  I with inital HEP for vision. Goal status:  met, 01/06/24  2.  I with compensatory strategies for visual deficits. Goal status:met, 01/06/24  3.  Pt will perform tabletop scanning activities with 90% or better accuracy, using compensations prn. Goal status:  ongoing, completed small peg design correctly today 01/06/24, tabletop scanning task number cancellation with 100% accuracy - 62M, 83% for symbol match level 7- 01/27/24  4.  Pt will navigate a busy environment, locating items with 75% accuracy without LOB  Goal status: met, 14/15 located first pass 01/06/24  5.  I with memory compensations Goal status: met, pt verbalizes understanding 01/12/24    LONG TERM GOALS: Target date: 02/02/24  I with updated HEP Goal status:met 12/29/23- pt returned demonstration of vision HEP  2.  Pt will demonstrate improved RUE fine motor coordination as evidenced by decreasing 9 hole peg test score to 35 secs or less. Goal status: 26.70, met, 12/29/23- however pt with difficulty with in hand manipulation  3.  Pt will perform environmental scanning activities with 90% or better accuracy, using compensations prn,  without LOB Goal status: ongoing, 14/15 located today 01/06/24  4.  Pt will demonstrate ability to read a page of newsprint  without partial occulsion and minimal reports of diplopia. Goal status:met, pt denies diplopia today. 01/27/24   ASSESSMENT:  CLINICAL IMPRESSION: Pt is progressing towards goals.  He reports that diplopia is resolving and he is returning to prior level of actvity at home.PERFORMANCE DEFICITS: in functional skills including ADLs, IADLs, coordination, dexterity, Fine motor control, mobility, balance, endurance, decreased knowledge of precautions, vision, and UE functional use, cognitive skills including memory, problem solving, and safety awareness, and psychosocial skills including coping strategies, environmental adaptation, habits, interpersonal interactions, and routines and behaviors.   IMPAIRMENTS: are limiting patient from ADLs, IADLs,  play, leisure, and social participation.   CO-MORBIDITIES: may have co-morbidities  that affects occupational performance. Patient will benefit from skilled OT to address above impairments and improve overall function.  MODIFICATION OR ASSISTANCE TO COMPLETE EVALUATION: Min-Moderate modification of tasks or assist with assess necessary to complete an evaluation.  OT OCCUPATIONAL PROFILE AND HISTORY: Detailed assessment: Review of records and additional review of physical, cognitive, psychosocial history related to current functional performance.  CLINICAL DECISION MAKING: LOW - limited treatment options, no task modification necessary  REHAB POTENTIAL: Good  EVALUATION COMPLEXITY: Low    PLAN:  OT FREQUENCY: 16 visits  OT DURATION: 10 weeks  PLANNED INTERVENTIONS: 97168 OT Re-evaluation, 97535 self care/ADL training, 02889 therapeutic exercise, 97530 therapeutic activity, 97112 neuromuscular re-education, 97140 manual therapy, 97116 gait training, 02964 ultrasound, 97018 paraffin, 02989 moist heat, 97010 cryotherapy, 97129 Cognitive training (first 15 min), 02869 Cognitive training(each additional 15 min), passive range of motion, balance training,  functional mobility training, visual/perceptual remediation/compensation, energy conservation, coping strategies training, patient/family education, and DME and/or AE instructions  RECOMMENDED OTHER SERVICES: PT  CONSULTED AND AGREED WITH PLAN OF CARE: Patient and family member/caregiver  PLAN FOR NEXT SESSION:  continue to address tabletop scanning, environmental scanning   Meko Bellanger, OTR/L 01/27/2024, 3:16 PM

## 2024-01-28 NOTE — Progress Notes (Signed)
 [facility] Summary Report / Loop Recorder

## 2024-01-28 NOTE — Addendum Note (Signed)
 Addended by: TAWNI DRILLING D on: 01/28/2024 11:48 AM   Modules accepted: Orders

## 2024-02-02 ENCOUNTER — Ambulatory Visit: Payer: Medicare Other

## 2024-02-03 ENCOUNTER — Ambulatory Visit: Attending: Family Medicine | Admitting: Occupational Therapy

## 2024-02-03 ENCOUNTER — Encounter: Payer: Self-pay | Admitting: Occupational Therapy

## 2024-02-03 ENCOUNTER — Ambulatory Visit

## 2024-02-03 DIAGNOSIS — R278 Other lack of coordination: Secondary | ICD-10-CM

## 2024-02-03 DIAGNOSIS — R41844 Frontal lobe and executive function deficit: Secondary | ICD-10-CM | POA: Diagnosis present

## 2024-02-03 DIAGNOSIS — I639 Cerebral infarction, unspecified: Secondary | ICD-10-CM | POA: Insufficient documentation

## 2024-02-03 DIAGNOSIS — R41840 Attention and concentration deficit: Secondary | ICD-10-CM | POA: Insufficient documentation

## 2024-02-03 DIAGNOSIS — R2689 Other abnormalities of gait and mobility: Secondary | ICD-10-CM | POA: Diagnosis present

## 2024-02-03 DIAGNOSIS — R41842 Visuospatial deficit: Secondary | ICD-10-CM | POA: Insufficient documentation

## 2024-02-03 NOTE — Therapy (Signed)
 OUTPATIENT PHYSICAL THERAPY NEURO TREATMENT   Patient Name: Adrian Rodgers MRN: [id number] DOB:Dec 22, 1940, 83 y.o., male Today's Date: 02/03/2024   PCP: Jon Berth REFERRING PROVIDER: Lonni Dalton  END OF SESSION:  PT End of Session - 02/03/24 1614     Visit Number 8    Date for PT Re-Evaluation 03/08/24    Authorization Type UHC    PT Start Time 1614    PT Stop Time 1700    PT Time Calculation (min) 46 min                 Past Medical History:  Diagnosis Date   Anemia    Aortic atherosclerosis (HCC)    Cerebral aneurysm    Community acquired pneumonia of right middle lobe of lung 09/20/2017   Emphysema of lung (HCC)    Esophagitis    Gastric ulcer    H. 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)    Stroke (HCC) 05/01/2020   Tobacco dependence    Past Surgical History:  Procedure Laterality Date   BIOPSY  04/10/2022   Procedure: BIOPSY;  Surgeon: Leigh Elspeth SQUIBB, MD;  Location: [facility] ENDOSCOPY;  Service: Gastroenterology;;   ESOPHAGOGASTRODUODENOSCOPY (EGD) WITH PROPOFOL  N/A 04/10/2022   Procedure: ESOPHAGOGASTRODUODENOSCOPY (EGD) WITH PROPOFOL ;  Surgeon: Leigh Elspeth SQUIBB, MD;  Location: [facility] ENDOSCOPY;  Service: Gastroenterology;  Laterality: N/A;   HEMOSTASIS CONTROL  04/10/2022   Procedure: HEMOSTASIS CONTROL;  Surgeon: Leigh Elspeth SQUIBB, MD;  Location: [facility] ENDOSCOPY;  Service: Gastroenterology;;  purastat    implantable loop recorder placement  06/05/2020   Medtronic Reveal [place] model [phone] [id number]) implantable loop recorder    Patient Active Problem List   Diagnosis Date Noted   Blurry vision, bilateral 11/25/2023   Acute ischemic stroke (HCC) 11/25/2023   Paroxysmal atrial fibrillation (HCC) 06/24/2023   Hypercoagulable state due to paroxysmal atrial fibrillation (HCC) 06/24/2023   BPH (benign prostatic hyperplasia) 12/20/2022   GERD  without esophagitis 12/20/2022   Sepsis due to pneumonia (HCC) 12/20/2022   LLL pneumonia 12/19/2022   GI bleed 04/11/2022   Upper GI bleed 04/10/2022   Acute gastric ulcer with hemorrhage 04/10/2022   Dark stools 04/09/2022   Heme positive stool 04/09/2022   Anemia 04/09/2022   Antiplatelet or antithrombotic long-term use 04/09/2022   Hypotension due to hypovolemia 04/08/2022   SIRS (systemic inflammatory response syndrome) (HCC) 04/08/2022   Acute encephalopathy 04/08/2022   Tobacco abuse 03/27/2022   Acute respiratory failure due to COVID-19 (HCC) 03/25/2022   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   Generalized weakness 03/27/2021   Ascending aortic aneurysm (HCC) 09/22/2020   COPD (chronic obstructive pulmonary disease) (HCC) 05/18/2020   Essential hypertension 05/13/2020   Dyslipidemia 05/13/2020   History of CVA (cerebrovascular accident) 05/12/2020    ONSET DATE: 11/25/23  REFERRING DIAG:  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    Pt accompanied by: daughter  PERTINENT HISTORY:  11/25/23 Adrian Rodgers is a 83 y.o. 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   PAIN:  Are you having pain? L foot because I have a callus   PRECAUTIONS: Fall  RED FLAGS: None   WEIGHT BEARING  RESTRICTIONS: No  FALLS: Has patient fallen in last 6 months? No  LIVING ENVIRONMENT: Lives with: lives alone Lives in: House/apartment Stairs: Yes: External: 4 in front, 8 in the back steps; on right going up and can reach both Has following equipment at home: Single point cane, Quad cane small base, Walker - 2 wheeled, Environmental consultant - 4 wheeled, Wheelchair (manual), and Grab bars  PLOF: Independent with basic ADLs and Independent with household mobility without device  PATIENT GOALS: walk without assistive device, be able to pass your tests   OBJECTIVE:  Note: Objective measures were completed at Evaluation unless otherwise noted.  DIAGNOSTIC FINDINGS: MRI brain showed small pontine stroke, correlates well to INO.  Dilated by neurology, suspected small vessel disease due to smoking, hypercholesterolemia. - CTA head and neck unremarkable, echocardiogram with PFO, likely unrelated  COGNITION: Overall cognitive status: Within functional limits for tasks assessed   SENSATION: WFL   POSTURE: rounded shoulders and forward head  LOWER EXTREMITY ROM:  WFL    LOWER EXTREMITY MMT:  grossly 4+/5    BED MOBILITY:  Not tested  TRANSFERS: Sit to stand: Complete Independence  Assistive device utilized: None     Stand to sit: Complete Independence  Assistive device utilized: None     Chair to chair: Modified independence and SBA  Assistive device utilized: Environmental consultant - 4 wheeled        CURB:  Not tested  STAIRS: Findings: Level of Assistance: Modified independence, Stair Negotiation Technique: Step to Pattern with Single Rail on Right Bilateral Rails, Number of Stairs: 4 in front and 8 in the front, and Height of Stairs: 6     GAIT: Findings: Gait Characteristics: step to pattern, decreased stride length, shuffling, narrow BOS, poor foot clearance- Right, and poor foot clearance- Left, Distance walked: in clinic distances, Assistive device utilized:Walker - 4 wheeled, and Level of  assistance: Modified independence  FUNCTIONAL TESTS:  5 times sit to stand: 20s from chair  Timed up and go (TUG): 31s with rolling walker                                                                                                                               TREATMENT DATE:  02/03/24 NuStep L5x5mins  Side stepping over obstacles  Step ups 6 On airex reaching for numbers Marching on airex STS 2x10 w/chest press 2x10 Shoulder ext 2x10  Leg press 20# 2x10   01/27/24 NuStep L5x40mins  5xSTS- 15s TUG w/o AD- 17.42s Leg ext 10# 2x10 HS curls 25# 2x10 STS on airex 2x10 On airex catch  Calf raises 2x12  Shoulder ext green 2x10  01/23/24 STS with chest press 2x10 NuStep L5 x9mins Standing rows and ext green in front of mat 2x10 Step ups 6 Step up on airex forward and side ways Leg press 20# 2x10  01/12/24 NuStep L5x46mins TUG with cane STS on airex 2x10 Step ups 6  Marching on airex  Side steps over obstacles Feet together on airex then with eyes closed   01/06/24 NuStep L5x11mins Leg ext 10# 2x10 HS curls 20# 2x10 Step ups 4 Calf raises 2x10 Side steps on airex Cone taps  Walking on beam  12/29/23 NuStep L5x100mins  Seated hip abd green 2x10 Ball squeeze 2x10 STS with OHP 2x10 2# marching, hip abd, hip ext, HS curls standing in bars 2x10 Side steps on beam TUG- 24s w/quad cane  12/22/23 NuStep L5x81mins  LAQ 2# 2x10 2# marching with walker  HS curls green 2x10 STS 2x10 Seated heel to toe raises 2x10 Step taps 4 Step ups 4  12/15/23- EVAL    PATIENT EDUCATION: Education details: POC, HEP, fall risk, stroke education  Person educated: Patient and Child(ren) Education method: Explanation and Demonstration Education comprehension: verbalized understanding and returned demonstration  HOME EXERCISE PROGRAM: Access Code: [email or web address] URL: [email or web address] Date: 12/15/2023 Prepared by: Almetta Fam  Exercises - Standing March with  Counter Support  - 1 x daily - 7 x weekly - 2 sets - 10 reps - Standing Hip Abduction with Counter Support  - 1 x daily - 7 x weekly - 2 sets - 10 reps - Heel Toe Raises with Counter Support  - 1 x daily - 7 x weekly - 2 sets - 10 reps - Sit to Stand  - 1 x daily - 7 x weekly - 2 sets - 10 reps  GOALS: Goals reviewed with patient? Yes  SHORT TERM GOALS: Target date: 01/26/24  Patient will be independent with initial HEP. Baseline: given 12/15/23 Goal status: IN PROGRESS 01/06/24, MET 01/27/24  2.  Patient will demonstrate decreased fall risk by scoring < 20 sec on TUG with LRAD. Baseline: 31s with rolling walker Goal status: MET 24s with quad cane 12/29/23, 15s w/quad cane 01/12/24  3.  Patient will be educated on strategies to decrease risk of falls.  Baseline:  Goal status: IN PROGRESS 01/06/24   LONG TERM GOALS: Target date: 03/08/24  Patient will be independent with advanced/ongoing HEP to improve outcomes and carryover.  Baseline:  Goal status: INITIAL  2.  Patient will be able to ambulate 500' with LRAD with good safety to access community.  Baseline: using rolling walker, poor foot clearance, shuffling  Goal status: IN PROGRESS using quad cane but not safe 01/06/24  3.  Patient will demonstrate improved functional LE strength as demonstrated by 5xSTS <14s. Baseline: 20s Goal status: IN PROGRESS 15s 01/27/24  4.  Patient will demonstrate decreased fall risk by scoring < 20 sec on TUG.  Baseline: 31s with rolling walker Goal status: 17.42s MET 01/27/24   ASSESSMENT:  CLINICAL IMPRESSION: Patient returns walking with quad cane today, his safety awareness is not the best. He still shuffles his feet. He reports side steps over obstacles were tough, needed cues to take wide steps. One LOB with STS from coming too far forward on toes but able to use stepping strategy to stay upright. Would benefit from continued PT to improve  his balance and strength to increase independence with  safety.   EVALUATION:  Patient is a 83 y.o. male who was seen today for physical therapy evaluation and treatment for double vision and a stroke on 11/25/23. He is currently walking with a rolling walker and reports he uses his cane some in the house. He has a callus on his L foot; his daughter reports he gets them often and gets treatment for it. Patient reports once the callus is treated, he feels much better and can walk better. He does live on his own and mostly independent but presents as a fall risk and has decreased safety awareness. Patient will benefit from PT to address his gait and balance impairments to improve his functional independence and decrease his risk for falls and another stroke.   OBJECTIVE IMPAIRMENTS: Abnormal gait, decreased activity tolerance, decreased balance, decreased endurance, difficulty walking, decreased safety awareness, and improper body mechanics.   ACTIVITY LIMITATIONS: bending, standing, squatting, stairs, transfers, and locomotion level  PARTICIPATION LIMITATIONS: cleaning, community activity, and yard work  PERSONAL FACTORS: Age, Behavior pattern, Past/current experiences, and Transportation are also affecting patient's functional outcome.   REHAB POTENTIAL: Good  CLINICAL DECISION MAKING: Stable/uncomplicated  EVALUATION COMPLEXITY: Low  PLAN:  PT FREQUENCY: 2x/week  PT DURATION: 12 weeks  PLANNED INTERVENTIONS: 97110-Therapeutic exercises, 97530- Therapeutic activity, 97112- Neuromuscular re-education, 97535- Self Care, 02859- Manual therapy, [phone]- Gait training, Patient/Family education, Balance training, Stair training, DME instructions, Cryotherapy, and Moist heat  PLAN FOR NEXT SESSION: gait training, balance, LE strengthening    Almetta Fam, PT 02/03/2024, 5:04 PM

## 2024-02-03 NOTE — Therapy (Unsigned)
 OUTPATIENT OCCUPATIONAL THERAPY NEURO Treatment  Patient Name: Adrian Rodgers MRN: [id number] DOB:02-11-1941, 83 y.o., male Today's Date: 02/03/2024  PCP: Jon Berth NP REFERRING PROVIDER: Dr. Jonel OCCUPATIONAL THERAPY DISCHARGE SUMMARY    Current functional level related to goals / functional outcomes: Pt met all Long term goals.   Remaining deficits: mild visual deficits, decreased balance   Education / Equipment: Pt was educted regarding vision HEP. He demonstrates understanding.   Patient agrees to discharge. Patient goals were met. Patient is being discharged due to meeting the stated rehab goals..     END OF SESSION:  OT End of Session - 02/03/24 1533     Visit Number 10    Number of Visits 17    Authorization Type U[employer] Medicare    Authorization - Visit Number 10    Progress Note Due on Visit 10    OT Start Time 1532    OT Stop Time 1612    OT Time Calculation (min) 40 min               Past Medical History:  Diagnosis Date   Anemia    Aortic atherosclerosis (HCC)    Cerebral aneurysm    Community acquired pneumonia of right middle lobe of lung 09/20/2017   Emphysema of lung (HCC)    Esophagitis    Gastric ulcer    H. 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)    Stroke (HCC) 05/01/2020   Tobacco dependence    Past Surgical History:  Procedure Laterality Date   BIOPSY  04/10/2022   Procedure: BIOPSY;  Surgeon: Leigh Elspeth SQUIBB, MD;  Location: [facility] ENDOSCOPY;  Service: Gastroenterology;;   ESOPHAGOGASTRODUODENOSCOPY (EGD) WITH PROPOFOL  N/A 04/10/2022   Procedure: ESOPHAGOGASTRODUODENOSCOPY (EGD) WITH PROPOFOL ;  Surgeon: Leigh Elspeth SQUIBB, MD;  Location: [facility] ENDOSCOPY;  Service: Gastroenterology;  Laterality: N/A;   HEMOSTASIS CONTROL  04/10/2022   Procedure: HEMOSTASIS CONTROL;  Surgeon: Leigh Elspeth SQUIBB, MD;  Location: [facility] ENDOSCOPY;   Service: Gastroenterology;;  purastat    implantable loop recorder placement  06/05/2020   Medtronic Reveal [place] model [id number]) implantable loop recorder    Patient Active Problem List   Diagnosis Date Noted   Blurry vision, bilateral 11/25/2023   Acute ischemic stroke (HCC) 11/25/2023   Paroxysmal atrial fibrillation (HCC) 06/24/2023   Hypercoagulable state due to paroxysmal atrial fibrillation (HCC) 06/24/2023   BPH (benign prostatic hyperplasia) 12/20/2022   GERD without esophagitis 12/20/2022   Sepsis due to pneumonia (HCC) 12/20/2022   LLL pneumonia 12/19/2022   GI bleed 04/11/2022   Upper GI bleed 04/10/2022   Acute gastric ulcer with hemorrhage 04/10/2022   Dark stools 04/09/2022   Heme positive stool 04/09/2022   Anemia 04/09/2022   Antiplatelet or antithrombotic long-term use 04/09/2022   Hypotension due to hypovolemia 04/08/2022   SIRS (systemic inflammatory response syndrome) (HCC) 04/08/2022   Acute encephalopathy 04/08/2022   Tobacco abuse 03/27/2022   Acute respiratory failure due to COVID-19 (HCC) 03/25/2022   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   Generalized weakness 03/27/2021   Ascending aortic aneurysm (HCC) 09/22/2020   COPD (chronic obstructive pulmonary disease) (HCC) 05/18/2020   Essential hypertension 05/13/2020   Dyslipidemia 05/13/2020   History of CVA (cerebrovascular accident) 05/12/2020    ONSET DATE: 11/25/23  REFERRING DIAG:  Diagnosis  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 CT negative. 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 PMH significant for hyperlipidemia, gastric ulcer, H. pylori, hypertension, CVA, PNA and tobacco abuse.   PRECAUTIONS: Fall  WEIGHT BEARING RESTRICTIONS: No  PAIN: none  FALLS: Has patient fallen in last 6 months? No  LIVING ENVIRONMENT: Lives with: lives alone Lives in: House/apartment Stairs: No Has following equipment at home: Quad cane small base and grab bar a tub  PLOF: Independent  PATIENT GOALS: improve vision  OBJECTIVE:  Note: Objective measures were completed at Evaluation unless otherwise noted.  HAND DOMINANCE: Right  ADLs: Overall ADLs: mod I with all basic ADLS. Transfers/ambulation related to ADLs:mod I with walker  IADLs:  Light housekeeping: has not attempted yet Meal Prep: Pt's dtr has been preparing meals and he has been warming in the microwave. Community mobility: supervision with small based quad cane, Pt has walker and he practiced using in clinic with improved safety. Medication management: dtr fills pillbox, alexa reminds pt to take meds Financial management: Pt's dtr handles   MOBILITY STATUS: decreased balance uses quad cane or walker  FUNCTIONAL OUTCOME MEASURES: 42M number cancellation- 88% accuracy  UPPER EXTREMITY ROM:  WFLS   UPPER EXTREMITY MMT:   bilateral UE strength is grossly 4+/5   HAND FUNCTION: Grip strength: Right: 35 lbs; Left: 30 lbs  COORDINATION: 9 Hole Peg test: Right: 41.71 sec; Left: 35.66 sec  SENSATION: WFL  COGNITION: Overall cognitive status: Impaired, deceased short term memory, spells WORLD backwards   VISION: Subjective report: Pt reports double vision  VISION ASSESSMENT:  L eye does not track to right side Impaired vision Eye alignment: 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 Pt was unsteady ambulating with quad cane. He has a walker at home, and he used the walker to walkt to the waiting room with better balance. Pt was encouraged to use his walker for safety.                                                                                                                            TREATMENT DATE: 1.5 M number cancellation  task 79/80 items located 99% accuracy Environmental scanning task 100% accuracy today. Copying small peg design today, pt completed correctly without v.c Therapist recommended pt continues with vision HEP as he has blurriness, diplopa when looking to far right side. Therapist reviewed with pt/ dtr. Therapist checked progress towards goals. Pt reports he is pretty much back to baseline and he agrees with d/c.  01/27/24- 6M number cancellation task wearing reading glasses-100% accuracy symbol match level 7 on constant therapy: 83%( multiple screens) Reading comprehension task to answer questions for scanning and memory,  level 1- 83% accuracy as pt had difficulty navigating I-pad despite min-mod v.c. However pt reports no double vision when reading for task. Increased time and v.c required.   01/23/24:   Completing 12-piece puzzle with mod difficulty/cueing for visual-perceptual skills and problem solving.    Constant therapy:  Symbol Match level 5 with 93% accuracy and 52.02sec average response time.  Then level 7 with 87% accuracy and 35.93 sec average for visual scanning.  Environmental Scanning/navigation: (ambulating) in min-mod  distracting environment with 12/15 accuracy = 80% on first pass. Pt able to locate 3 remaining items on second pass with min-mod cueing.  Discussed errors (all missed items on R side) and need to perform head turns/scan further to the R to compensate for visual deficits.  No LOB to retrieve items or ambulate.   01/12/24- Pipe tree design, x 2 reps for visual skills with a cognitve component, modv.c and difficulty for organization and reconizing pieces to copy design. Increased time required. Pt brought in his reading glassses however the lns kept falling out so he was unable to use.  Reviewed memory strategies. UBE x 5 mins level 1 for conditioning  01/06/24-UBE x 6 mins level 2 for conditoning copying small peg design with RUE for vision and cognition as well as fine motor coordination, pt completed design with 100% accuracy. Environmental scanning: 14/15 located first pass, only 1 items missed. Reviewed memory and visual compensation strategies, pt verbalized understanding.  Person educated: Patient,  Education method: Explanation,  Education comprehension: verbalized understanding,   HOME EXERCISE PROGRAM: 12/04/23- diplopia HEP, coordinationHEP- beginning 12/22/23- memory compensaitons  GOALS: Goals reviewed with patient? Yes  SHORT TERM GOALS: Target date: 12/31/23  I with inital HEP for vision. Goal status:  met, 01/06/24  2.  I with compensatory strategies for visual deficits. Goal status:met, 01/06/24  3.  Pt will perform tabletop scanning activities with 90% or better accuracy, using compensations prn. Goal status:  met, completed small peg design correctly today, 79/80 items on 1.5 M number cancellation task 02/03/24 4.  Pt will navigate a busy environment, locating items with 75% accuracy without LOB  Goal status: met, 14/15 located first pass 01/06/24  5.  I with memory compensations Goal status: met, pt verbalizes understanding 01/12/24    LONG TERM GOALS: Target date:  02/02/24  I with updated HEP Goal status:met 12/29/23- pt returned demonstration of vision HEP  2.  Pt will demonstrate improved RUE fine motor coordination as evidenced by decreasing 9 hole peg test score to 35 secs or less. Goal status: 26.70, met, 12/29/23-    3.  Pt will perform environmental scanning activities with 90% or better accuracy, using compensations prn,  without LOB Goal status: met 100% today8/6/25  4.  Pt will demonstrate ability to read a page of newsprint without partial occulsion and minimal reports of diplopia. Goal status:met, pt denies diplopia today. 01/27/24   ASSESSMENT:  CLINICAL IMPRESSION:For the reporting  period of 12/01/23- 02/03/24 Pt has made excellent progress. He has achieved all LTG's and agrees with d/c.    PERFORMANCE DEFICITS: in functional skills including ADLs, IADLs, coordination, dexterity, Fine motor control, mobility, balance, endurance, decreased knowledge of precautions, vision, and UE functional use, cognitive skills including memory, problem solving, and safety awareness, and psychosocial skills including coping strategies, environmental adaptation, habits, interpersonal interactions, and routines and behaviors.   IMPAIRMENTS: are limiting patient from ADLs, IADLs, play, leisure, and social participation.   CO-MORBIDITIES: may have co-morbidities  that affects occupational performance. Patient will benefit from skilled OT to address above impairments and improve overall function.  MODIFICATION OR ASSISTANCE TO COMPLETE EVALUATION: Min-Moderate modification of tasks or assist with assess necessary to complete an evaluation.  OT OCCUPATIONAL PROFILE AND HISTORY: Detailed assessment: Review of records and additional review of physical, cognitive, psychosocial history related to current functional performance.  CLINICAL DECISION MAKING: LOW - limited treatment options, no task modification necessary  REHAB POTENTIAL: Good  EVALUATION  COMPLEXITY: Low    PLAN:  OT FREQUENCY: 16 visits  OT DURATION: 10 weeks  PLANNED INTERVENTIONS: 97168 OT Re-evaluation, 97535 self care/ADL training, 02889 therapeutic exercise, 97530 therapeutic activity, 97112 neuromuscular re-education, 97140 manual therapy, 97116 gait training, 02964 ultrasound, 97018 paraffin, 02989 moist heat, 97010 cryotherapy, 97129 Cognitive training (first 15 min), 02869 Cognitive training(each additional 15 min), passive range of motion, balance training, functional mobility training, visual/perceptual remediation/compensation, energy conservation, coping strategies training, patient/family education, and DME and/or AE instructions  RECOMMENDED OTHER SERVICES: PT  CONSULTED AND AGREED WITH PLAN OF CARE: Patient and family member/caregiver  PLAN FOR NEXT SESSION: d/c OT   Denim Start, OTR/L 02/03/2024, 3:34 PM

## 2024-02-04 ENCOUNTER — Encounter: Payer: Self-pay | Admitting: Occupational Therapy

## 2024-02-09 NOTE — Therapy (Signed)
 OUTPATIENT PHYSICAL THERAPY NEURO TREATMENT   Patient Name: Adrian Rodgers MRN: [id number] DOB:[birth year], 83 y.o., male Today's Date: 02/10/2024   PCP: Jon Berth REFERRING PROVIDER: Lonni Dalton  END OF SESSION:  PT End of Session - 02/10/24 1550     Visit Number 9    Date for PT Re-Evaluation 03/08/24    Authorization Type UHC    PT Start Time 1550    PT Stop Time 1630    PT Time Calculation (min) 40 min                  Past Medical History:  Diagnosis Date   Anemia    Aortic atherosclerosis (HCC)    Cerebral aneurysm    Community acquired pneumonia of right middle lobe of lung 09/20/2017   Emphysema of lung (HCC)    Esophagitis    Gastric ulcer    H. 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)    Stroke (HCC) 05/01/2020   Tobacco dependence    Past Surgical History:  Procedure Laterality Date   BIOPSY  04/10/2022   Procedure: BIOPSY;  Surgeon: Leigh Elspeth SQUIBB, MD;  Location: [facility] ENDOSCOPY;  Service: Gastroenterology;;   ESOPHAGOGASTRODUODENOSCOPY (EGD) WITH PROPOFOL  N/A 04/10/2022   Procedure: ESOPHAGOGASTRODUODENOSCOPY (EGD) WITH PROPOFOL ;  Surgeon: Leigh Elspeth SQUIBB, MD;  Location: [facility] ENDOSCOPY;  Service: Gastroenterology;  Laterality: N/A;   HEMOSTASIS CONTROL  04/10/2022   Procedure: HEMOSTASIS CONTROL;  Surgeon: Leigh Elspeth SQUIBB, MD;  [facility] ENDOSCOPY;  Service: Gastroenterology;;  purastat    implantable loop recorder placement  06/05/2020   Medtronic Reveal [place] model [id number]) implantable loop recorder    Patient Active Problem List   Diagnosis Date Noted   Blurry vision, bilateral 11/25/2023   Acute ischemic stroke (HCC) 11/25/2023   Paroxysmal atrial fibrillation (HCC) 06/24/2023   Hypercoagulable state due to paroxysmal atrial fibrillation (HCC) 06/24/2023   BPH (benign prostatic hyperplasia) 12/20/2022    GERD without esophagitis 12/20/2022   Sepsis due to pneumonia (HCC) 12/20/2022   LLL pneumonia 12/19/2022   GI bleed 04/11/2022   Upper GI bleed 04/10/2022   Acute gastric ulcer with hemorrhage 04/10/2022   Dark stools 04/09/2022   Heme positive stool 04/09/2022   Anemia 04/09/2022   Antiplatelet or antithrombotic long-term use 04/09/2022   Hypotension due to hypovolemia 04/08/2022   SIRS (systemic inflammatory response syndrome) (HCC) 04/08/2022   Acute encephalopathy 04/08/2022   Tobacco abuse 03/27/2022   Acute respiratory failure due to COVID-19 (HCC) 03/25/2022   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   Generalized weakness 03/27/2021   Ascending aortic aneurysm (HCC) 09/22/2020   COPD (chronic obstructive pulmonary disease) (HCC) 05/18/2020   Essential hypertension 05/13/2020   Dyslipidemia 05/13/2020   History of CVA (cerebrovascular accident) 05/12/2020    ONSET DATE: 11/25/23  REFERRING DIAG:  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 Trying to pick my feet up when I walk.    Pt accompanied by: daughter  PERTINENT HISTORY:  11/25/23 Adrian Rodgers is a 83 y.o. 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   PAIN:  Are you having pain? L foot because I have a callus   PRECAUTIONS: Fall  RED FLAGS: None   WEIGHT BEARING RESTRICTIONS: No  FALLS: Has patient fallen in last 6 months? No  LIVING ENVIRONMENT: Lives with: lives alone Lives in: House/apartment Stairs: Yes: External: 4 in front, 8 in the back steps; on right going up and can reach both Has following equipment at home: Single point cane, Quad cane small base, Walker - 2 wheeled, Environmental consultant - 4 wheeled, Wheelchair (manual), and Grab bars  PLOF: Independent with basic ADLs and Independent with household mobility without device  PATIENT GOALS: walk without assistive device, be able to pass your tests   OBJECTIVE:  Note: Objective measures were completed at Evaluation unless otherwise noted.  DIAGNOSTIC FINDINGS: MRI brain showed small pontine stroke, correlates well to INO.  Dilated by neurology, suspected small vessel disease due to smoking, hypercholesterolemia. - CTA head and neck unremarkable, echocardiogram with PFO, likely unrelated  COGNITION: Overall cognitive status: Within functional limits for tasks assessed   SENSATION: WFL   POSTURE: rounded shoulders and forward head  LOWER EXTREMITY ROM:  WFL    LOWER EXTREMITY MMT:  grossly 4+/5    BED MOBILITY:  Not tested  TRANSFERS: Sit to stand: Complete Independence  Assistive device utilized: None     Stand to sit: Complete Independence  Assistive device utilized: None     Chair to chair: Modified independence and SBA  Assistive device utilized: Environmental consultant - 4 wheeled        CURB:  Not tested  STAIRS: Findings: Level of Assistance: Modified independence, Stair Negotiation Technique: Step to Pattern with Single Rail on Right Bilateral Rails, Number of Stairs: 4 in front and 8 in the front, and Height of Stairs: 6     GAIT: Findings: Gait Characteristics: step to pattern, decreased stride length, shuffling, narrow BOS, poor foot clearance-  Right, and poor foot clearance- Left, Distance walked: in clinic distances, Assistive device utilized:Walker - 4 wheeled, and Level of assistance: Modified independence  FUNCTIONAL TESTS:  5 times sit to stand: 20s from chair  Timed up and go (TUG): 31s with rolling walker                                                                                                                               TREATMENT DATE:  02/10/24 NuStep L5x47mins  Calf raises on bar 2x12 Leg ext 15# 2x10 HS curls 25# 2x10 STS on airex 2x10 Walking on beam  Step ups from airex to 6 step    02/03/24 NuStep L5x66mins  Side stepping over obstacles  Step ups 6 On airex reaching for numbers Marching on airex STS 2x10 w/chest press 2x10 Shoulder ext 2x10  Leg press 20# 2x10   01/27/24 NuStep L5x51mins  5xSTS- 15s TUG w/o AD- 17.42s Leg ext 10# 2x10 HS curls 25# 2x10 STS on airex 2x10 On airex catch  Calf raises 2x12  Shoulder ext green 2x10  01/23/24 STS with chest press 2x10 NuStep L5 x24mins Standing rows and ext green in front of mat 2x10 Step ups 6 Step up on airex forward and side ways Leg press 20# 2x10  01/12/24 NuStep L5x37mins TUG with cane STS on airex 2x10 Step ups 6  Marching on airex  Side steps over obstacles Feet together on airex then with eyes closed   01/06/24 NuStep L5x70mins Leg ext 10# 2x10 HS curls 20# 2x10 Step ups 4 Calf raises 2x10 Side steps on airex Cone taps  Walking on beam  12/29/23 NuStep L5x12mins  Seated hip abd green 2x10 Ball squeeze 2x10 STS with OHP 2x10 2# marching, hip abd, hip ext, HS curls standing in bars 2x10 Side steps on beam TUG- 24s w/quad cane  12/22/23 NuStep L5x30mins  LAQ 2# 2x10 2# marching with walker  HS curls green 2x10 STS 2x10 Seated heel to toe raises 2x10 Step taps 4 Step ups 4  12/15/23- EVAL    PATIENT EDUCATION: Education details: POC, HEP, fall risk, stroke education  Person educated: Patient and  Child(ren) Education method: Explanation and Demonstration Education comprehension: verbalized understanding and returned demonstration  HOME EXERCISE PROGRAM: Access Code: [email or web address] URL: [email or web address] Date: 12/15/2023 Prepared by: Almetta Fam  Exercises - Standing March with Counter Support  - 1 x daily - 7 x weekly - 2 sets - 10 reps - Standing Hip Abduction with Counter Support  - 1 x daily - 7 x weekly - 2 sets - 10 reps - Heel Toe Raises with Counter Support  - 1 x daily - 7 x weekly - 2 sets - 10 reps - Sit to Stand  - 1 x daily - 7 x weekly - 2 sets - 10 reps  GOALS: Goals reviewed with patient? Yes  SHORT TERM GOALS: Target date: 01/26/24  Patient will be independent with initial HEP. Baseline: given 12/15/23 Goal status: IN PROGRESS 01/06/24, MET 01/27/24  2.  Patient will demonstrate decreased fall risk by scoring < 20 sec on TUG with LRAD. Baseline: 31s with rolling walker Goal status: MET 24s with quad cane 12/29/23, 15s w/quad cane 01/12/24  3.  Patient will be educated on strategies to decrease risk of falls.  Baseline:  Goal status: IN PROGRESS 01/06/24   LONG TERM GOALS: Target date: 03/08/24  Patient will be independent with advanced/ongoing HEP to improve outcomes and carryover.  Baseline:  Goal status: INITIAL  2.  Patient will be able to ambulate 500' with LRAD with good safety to access community.  Baseline: using rolling walker, poor foot clearance, shuffling  Goal status: IN PROGRESS using quad cane but not safe 01/06/24  3.  Patient will demonstrate improved functional LE strength as demonstrated by 5xSTS <14s. Baseline: 20s Goal status: IN PROGRESS 15s 01/27/24  4.  Patient will demonstrate decreased fall risk by scoring < 20 sec on TUG.  Baseline: 31s with rolling walker Goal status: 17.42s MET 01/27/24   ASSESSMENT:  CLINICAL IMPRESSION: Patient returns walking with quad cane today, his safety awareness is  not the best. He still  shuffles his feet. Continued to work on LE strengthening and balance. Does really well with STS on airex today, no LOB. Step ups needs some CGA. Balance on beam requires hand held assist on bars. Would benefit from continued PT to improve his balance and strength to increase independence with safety.   EVALUATION:  Patient is a 83 y.o. male who was seen today for physical therapy evaluation and treatment for double vision and a stroke on 11/25/23. He is currently walking with a rolling walker and reports he uses his cane some in the house. He has a callus on his L foot; his daughter reports he gets them often and gets treatment for it. Patient reports once the callus is treated, he feels much better and can walk better. He does live on his own and mostly independent but presents as a fall risk and has decreased safety awareness. Patient will benefit from PT to address his gait and balance impairments to improve his functional independence and decrease his risk for falls and another stroke.   OBJECTIVE IMPAIRMENTS: Abnormal gait, decreased activity tolerance, decreased balance, decreased endurance, difficulty walking, decreased safety awareness, and improper body mechanics.   ACTIVITY LIMITATIONS: bending, standing, squatting, stairs, transfers, and locomotion level  PARTICIPATION LIMITATIONS: cleaning, community activity, and yard work  PERSONAL FACTORS: Age, Behavior pattern, Past/current experiences, and Transportation are also affecting patient's functional outcome.   REHAB POTENTIAL: Good  CLINICAL DECISION MAKING: Stable/uncomplicated  EVALUATION COMPLEXITY: Low  PLAN:  PT FREQUENCY: 2x/week  PT DURATION: 12 weeks  PLANNED INTERVENTIONS: 97110-Therapeutic exercises, 97530- Therapeutic activity, 97112- Neuromuscular re-education, 97535- Self Care, 02859- Manual therapy, [phone]- Gait training, Patient/Family education, Balance training, Stair training, DME instructions, Cryotherapy, and  Moist heat  PLAN FOR NEXT SESSION: 10th visit progress note    Almetta Fam, PT 02/10/2024, 4:32 PM

## 2024-02-10 ENCOUNTER — Ambulatory Visit

## 2024-02-10 DIAGNOSIS — R278 Other lack of coordination: Secondary | ICD-10-CM

## 2024-02-10 DIAGNOSIS — I639 Cerebral infarction, unspecified: Secondary | ICD-10-CM

## 2024-02-10 DIAGNOSIS — R2689 Other abnormalities of gait and mobility: Secondary | ICD-10-CM

## 2024-02-23 ENCOUNTER — Ambulatory Visit

## 2024-02-23 DIAGNOSIS — I639 Cerebral infarction, unspecified: Secondary | ICD-10-CM

## 2024-02-24 ENCOUNTER — Ambulatory Visit

## 2024-02-24 DIAGNOSIS — R278 Other lack of coordination: Secondary | ICD-10-CM

## 2024-02-24 DIAGNOSIS — I639 Cerebral infarction, unspecified: Secondary | ICD-10-CM

## 2024-02-24 DIAGNOSIS — R2689 Other abnormalities of gait and mobility: Secondary | ICD-10-CM

## 2024-02-24 LAB — CUP PACEART REMOTE DEVICE CHECK
Date Time Interrogation Session: 20250823230841
Implantable Pulse Generator Implant Date: 20211206

## 2024-02-24 NOTE — Therapy (Signed)
 OUTPATIENT PHYSICAL THERAPY NEURO TREATMENT  Progress Note Reporting Period 12/15/23 to 02/24/24  See note below for Objective Data and Assessment of Progress/Goals.     Patient Name: Nico Syme MRN: [id number] DOB:[birth year], 83 y.o., male Today's Date: 02/24/2024   PCP: Jon Berth REFERRING PROVIDER: Lonni Dalton  END OF SESSION:  PT End of Session - 02/24/24 1543     Visit Number 10    Date for PT Re-Evaluation 03/08/24    Authorization Type U[employer]    PT Start Time 1545    PT Stop Time 1630    PT Time Calculation (min) 45 min                   Past Medical History:  Diagnosis Date   Anemia    Aortic atherosclerosis (HCC)    Cerebral aneurysm    Community acquired pneumonia of right middle lobe of lung 09/20/2017   Emphysema of lung (HCC)    Esophagitis    Gastric ulcer    H. 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)    Stroke (HCC) 05/01/2020   Tobacco dependence    Past Surgical History:  Procedure Laterality Date   BIOPSY  04/10/2022   Procedure: BIOPSY;  Surgeon: Leigh Elspeth SQUIBB, MD;  Location: [facility] ENDOSCOPY;  Service: Gastroenterology;;   ESOPHAGOGASTRODUODENOSCOPY (EGD) WITH PROPOFOL  N/A 04/10/2022   Procedure: ESOPHAGOGASTRODUODENOSCOPY (EGD) WITH PROPOFOL ;  Surgeon: Leigh Elspeth SQUIBB, MD;  Location: [facility] ENDOSCOPY;  Service: Gastroenterology;  Laterality: N/A;   HEMOSTASIS CONTROL  04/10/2022   Procedure: HEMOSTASIS CONTROL;  Surgeon: Leigh Elspeth SQUIBB, MD;  Location: [facility] ENDOSCOPY;  Service: Gastroenterology;;  purastat    implantable loop recorder placement  06/05/2020   Medtronic Reveal [place] model [id number]) implantable loop recorder    Patient Active Problem List   Diagnosis Date Noted   Blurry vision, bilateral 11/25/2023   Acute ischemic stroke (HCC) 11/25/2023   Paroxysmal atrial fibrillation (HCC) 06/24/2023    Hypercoagulable state due to paroxysmal atrial fibrillation (HCC) 06/24/2023   BPH (benign prostatic hyperplasia) 12/20/2022   GERD without esophagitis 12/20/2022   Sepsis due to pneumonia (HCC) 12/20/2022   LLL pneumonia 12/19/2022   GI bleed 04/11/2022   Upper GI bleed 04/10/2022   Acute gastric ulcer with hemorrhage 04/10/2022   Dark stools 04/09/2022   Heme positive stool 04/09/2022   Anemia 04/09/2022   Antiplatelet or antithrombotic long-term use 04/09/2022   Hypotension due to hypovolemia 04/08/2022   SIRS (systemic inflammatory response syndrome) (HCC) 04/08/2022   Acute encephalopathy 04/08/2022   Tobacco abuse 03/27/2022   Acute respiratory failure due to COVID-19 (HCC) 03/25/2022   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   Generalized weakness 03/27/2021   Ascending aortic aneurysm (HCC) 09/22/2020   COPD (chronic obstructive pulmonary disease) (HCC) 05/18/2020   Essential hypertension 05/13/2020   Dyslipidemia 05/13/2020   History of CVA (cerebrovascular accident) 05/12/2020    ONSET DATE: 11/25/23  REFERRING DIAG:  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  I63.9 (ICD-10-CM) - Acute ischemic stroke (HCC)    THERAPY DIAG:  Other lack of coordination  Other abnormalities of gait and mobility  Cerebrovascular accident (CVA), unspecified mechanism (HCC)  Rationale for Evaluation and Treatment: Rehabilitation  SUBJECTIVE:  SUBJECTIVE STATEMENT: Tired from walking. I went around the block at my house.    Pt accompanied by: daughter  PERTINENT HISTORY:  11/25/23 Cason Luffman is a 83 y.o. male with medical history significant of hypertension, hyperlipidemia, prior CVAs without residual deficit, paroxysmal atrial fibrillation on chronic anticoagulation, cerebral  aneurysm, and COPD presents with double vision since yesterday. 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   PAIN:  Are you having pain? L foot because I have a callus   PRECAUTIONS: Fall  RED FLAGS: None   WEIGHT BEARING RESTRICTIONS: No  FALLS: Has patient fallen in last 6 months? No  LIVING ENVIRONMENT: Lives with: lives alone Lives in: House/apartment Stairs: Yes: External: 4 in front, 8 in the back steps; on right going up and can reach both Has following equipment at home: Single point cane, Quad cane small base, Walker - 2 wheeled, Environmental consultant - 4 wheeled, Wheelchair (manual), and Grab bars  PLOF: Independent with basic ADLs and Independent with household mobility without device  PATIENT GOALS: walk without assistive device, be able to pass your tests   OBJECTIVE:  Note: Objective measures were completed at Evaluation unless otherwise noted.  DIAGNOSTIC FINDINGS: MRI brain showed small pontine stroke, correlates well to INO.  Dilated by neurology, suspected small vessel disease due to smoking, hypercholesterolemia. - CTA head and neck unremarkable, echocardiogram with PFO, likely unrelated  COGNITION: Overall cognitive status: Within functional limits for tasks assessed   SENSATION: WFL   POSTURE: rounded shoulders and forward head  LOWER EXTREMITY ROM:  WFL    LOWER EXTREMITY MMT:  grossly 4+/5    BED MOBILITY:  Not tested  TRANSFERS: Sit to stand: Complete Independence  Assistive device utilized: None     Stand to sit: Complete Independence  Assistive device utilized: None     Chair to chair: Modified independence and SBA  Assistive device utilized: Environmental consultant - 4 wheeled        CURB:  Not tested  STAIRS: Findings: Level of Assistance: Modified independence, Stair Negotiation Technique: Step to Pattern with Single Rail on Right Bilateral Rails, Number of Stairs: 4 in front and 8 in the front, and Height of Stairs: 6     GAIT: Findings: Gait  Characteristics: step to pattern, decreased stride length, shuffling, narrow BOS, poor foot clearance- Right, and poor foot clearance- Left, Distance walked: in clinic distances, Assistive device utilized:Walker - 4 wheeled, and Level of assistance: Modified independence  FUNCTIONAL TESTS:  5 times sit to stand: 20s from chair  Timed up and go (TUG): 31s with rolling walker                                                                                                                               TREATMENT DATE:  02/24/24 10th visit PN Leg ext 10# 2x10 HS curls 35# 2x10 NuStep L5x45mins  Step ups 6 On airex cone taps- minA with bars  3# hip abd  and ext 2x10 STS with OHP 2x10    02/10/24 NuStep L5x64mins  Calf raises on bar 2x12 Leg ext 15# 2x10 HS curls 25# 2x10 STS on airex 2x10 Walking on beam  Step ups from airex to 6 step    02/03/24 NuStep L5x6mins  Side stepping over obstacles  Step ups 6 On airex reaching for numbers Marching on airex STS 2x10 w/chest press 2x10 Shoulder ext 2x10  Leg press 20# 2x10   01/27/24 NuStep L5x55mins  5xSTS- 15s TUG w/o AD- 17.42s Leg ext 10# 2x10 HS curls 25# 2x10 STS on airex 2x10 On airex catch  Calf raises 2x12  Shoulder ext green 2x10  01/23/24 STS with chest press 2x10 NuStep L5 x22mins Standing rows and ext green in front of mat 2x10 Step ups 6 Step up on airex forward and side ways Leg press 20# 2x10  01/12/24 NuStep L5x41mins TUG with cane STS on airex 2x10 Step ups 6  Marching on airex  Side steps over obstacles Feet together on airex then with eyes closed   01/06/24 NuStep L5x32mins Leg ext 10# 2x10 HS curls 20# 2x10 Step ups 4 Calf raises 2x10 Side steps on airex Cone taps  Walking on beam  12/29/23 NuStep L5x18mins  Seated hip abd green 2x10 Ball squeeze 2x10 STS with OHP 2x10 2# marching, hip abd, hip ext, HS curls standing in bars 2x10 Side steps on beam TUG- 24s w/quad cane  12/22/23 NuStep  L5x71mins  LAQ 2# 2x10 2# marching with walker  HS curls green 2x10 STS 2x10 Seated heel to toe raises 2x10 Step taps 4 Step ups 4  12/15/23- EVAL    PATIENT EDUCATION: Education details: POC, HEP, fall risk, stroke education  Person educated: Patient and Child(ren) Education method: Explanation and Demonstration Education comprehension: verbalized understanding and returned demonstration  HOME EXERCISE PROGRAM: Access Code: [email or web address] URL: [email or web address] Date: 02/24/2024 Prepared by: Almetta Fam  Exercises - Standing March with Counter Support  - 1 x daily - 7 x weekly - 2 sets - 10 reps - Standing Hip Abduction with Counter Support  - 1 x daily - 7 x weekly - 2 sets - 10 reps - Heel Toe Raises with Counter Support  - 1 x daily - 7 x weekly - 2 sets - 10 reps - Sit to Stand  - 1 x daily - 7 x weekly - 2 sets - 10 reps - Mini Squat with Counter Support  - 1 x daily - 7 x weekly - 2 sets - 10 reps - Standing Hip Extension with Counter Support  - 1 x daily - 7 x weekly - 2 sets - 10 reps   GOALS: Goals reviewed with patient? Yes  SHORT TERM GOALS: Target date: 01/26/24  Patient will be independent with initial HEP. Baseline: given 12/15/23 Goal status: IN PROGRESS 01/06/24, MET 01/27/24  2.  Patient will demonstrate decreased fall risk by scoring < 20 sec on TUG with LRAD. Baseline: 31s with rolling walker Goal status: MET 24s with quad cane 12/29/23, 15s w/quad cane 01/12/24  3.  Patient will be educated on strategies to decrease risk of falls.  Baseline:  Goal status: IN PROGRESS 01/06/24, MET 02/24/24   LONG TERM GOALS: Target date: 03/08/24  Patient will be independent with advanced/ongoing HEP to improve outcomes and carryover.  Baseline:  Goal status: MET  2.  Patient will be able to ambulate 500' with LRAD with good safety to access community.  Baseline: using rolling walker, poor foot  clearance, shuffling  Goal status: IN PROGRESS using quad  cane but not safe 01/06/24, MET 02/24/24  3.  Patient will demonstrate improved functional LE strength as demonstrated by 5xSTS <14s. Baseline: 20s Goal status: IN PROGRESS 15s 01/27/24, 12s 02/24/24 MET  4.  Patient will demonstrate decreased fall risk by scoring < 20 sec on TUG.  Baseline: 31s with rolling walker Goal status: 17.42s MET 01/27/24   ASSESSMENT:  CLINICAL IMPRESSION: Patient returns walking with quad cane today, his safety awareness is not the best but for the most part he is safe in the home and short community distances. He still shuffles his feet. Continued to work on LE strengthening and balance. He does seem to be more stable and safe than when we started. Was advised to not wander too far from his home and be careful with yard work. Has met all of his goals. Some cues needs to take big steps on and off with step up onto 6 box.   EVALUATION:  Patient is a 83 y.o. male who was seen today for physical therapy evaluation and treatment for double vision and a stroke on 11/25/23. He is currently walking with a rolling walker and reports he uses his cane some in the house. He has a callus on his L foot; his daughter reports he gets them often and gets treatment for it. Patient reports once the callus is treated, he feels much better and can walk better. He does live on his own and mostly independent but presents as a fall risk and has decreased safety awareness. Patient will benefit from PT to address his gait and balance impairments to improve his functional independence and decrease his risk for falls and another stroke.   OBJECTIVE IMPAIRMENTS: Abnormal gait, decreased activity tolerance, decreased balance, decreased endurance, difficulty walking, decreased safety awareness, and improper body mechanics.   ACTIVITY LIMITATIONS: bending, standing, squatting, stairs, transfers, and locomotion level  PARTICIPATION LIMITATIONS: cleaning, community activity, and yard work  PERSONAL  FACTORS: Age, Behavior pattern, Past/current experiences, and Transportation are also affecting patient's functional outcome.   REHAB POTENTIAL: Good  CLINICAL DECISION MAKING: Stable/uncomplicated  EVALUATION COMPLEXITY: Low  PLAN:  PT FREQUENCY: 2x/week  PT DURATION: 12 weeks  PLANNED INTERVENTIONS: 97110-Therapeutic exercises, 97530- Therapeutic activity, 97112- Neuromuscular re-education, 97535- Self Care, 02859- Manual therapy, [id number]- Gait training, Patient/Family education, Balance training, Stair training, DME instructions, Cryotherapy, and Moist heat  PLAN FOR NEXT SESSION: d/c  PHYSICAL THERAPY DISCHARGE SUMMARY  Visits from Start of Care: 10   Patient agrees to discharge. Patient goals were met. Patient is being discharged due to being pleased with the current functional level.    [place], [employer] 02/24/2024, 4:28 PM

## 2024-02-25 ENCOUNTER — Ambulatory Visit: Payer: Self-pay | Admitting: Cardiology

## 2024-02-25 ENCOUNTER — Telehealth: Payer: Self-pay

## 2024-02-25 NOTE — Telephone Encounter (Signed)
 Spoke with daughter Myrick.  She will discuss with patient if he would like to have it explanted or leave in place. Also discussed return of the remote monitor.   They will call us  back and let us  know if he would like it removed.  Will continue to follow with AF clinic.

## 2024-02-25 NOTE — Telephone Encounter (Signed)
 Pt daughter Myrick left a vm for her to be called at [phone]

## 2024-02-25 NOTE — Telephone Encounter (Signed)
  ILR @ RRT   ILR reached RRT: 02/24/24  Marked I in Paceart: Yes Enter note in Paceart:Yes Canceled future remotes:Yes Discontinued from website:Yes Entered in Speciality Comments:Yes  Attempted to contact patient. No answer, left message to call back.

## 2024-02-27 ENCOUNTER — Other Ambulatory Visit (HOSPITAL_COMMUNITY): Payer: Self-pay

## 2024-02-27 NOTE — Telephone Encounter (Signed)
  Daughter is calling back to let the provider know that Adrian Rodgers would like to have his devise removed. Please advise.

## 2024-03-02 ENCOUNTER — Ambulatory Visit

## 2024-03-04 ENCOUNTER — Ambulatory Visit

## 2024-03-08 ENCOUNTER — Ambulatory Visit: Payer: Medicare Other

## 2024-03-11 ENCOUNTER — Ambulatory Visit

## 2024-03-17 NOTE — Progress Notes (Signed)
 Remote Loop Recorder Transmission

## 2024-03-25 ENCOUNTER — Ambulatory Visit

## 2024-04-01 NOTE — Progress Notes (Signed)
 Remote Loop Recorder Transmission

## 2024-04-12 ENCOUNTER — Ambulatory Visit: Payer: Medicare Other

## 2024-04-26 ENCOUNTER — Ambulatory Visit

## 2024-04-29 ENCOUNTER — Institutional Professional Consult (permissible substitution): Admitting: Cardiology

## 2024-05-11 ENCOUNTER — Institutional Professional Consult (permissible substitution): Admitting: Cardiology

## 2024-05-13 NOTE — Progress Notes (Unsigned)
  Electrophysiology Office Note:    Date:  05/14/2024   ID:  Khris Jansson, DOB [birth year], MRN [id number]  PCP:  Minta Jon BROCKS, NP   [facility] Providers Cardiologist:  None     Referring MD: Minta Jon BROCKS, NP   History of Present Illness:    Adrian Rodgers is a 83 y.o. male with a medical history significant for stroke with ILR in place, HTN, emphysema referred for arrhythmia management.       History of Present Illness ILR was implanted in December 2021 after a cryptogenic stroke. She has since been diagnosed with atrial fibrillation and transitioned from antiplatelet to Eliquis .  The AF burden has been low, so a rate control approach has been used for now with ongoing monitoring with the ILR.  It has been almost a year since the last episode of atrial fibrillation detected by the loop recorder.  He is accompanied by his daughter today.  His daughter says he does not complain or notify her if he feels unwell or is having any issues.  His ILR battery has reached EOL, 90% stated to discuss having it removed.         Today, he reports he feels well and has no complaints  EKGs/Labs/Other Studies Reviewed Today:     Echocardiogram:  TTE May 2025 LVEF 60 to 65%.  Grade 1 diastolic dysfunction.  Mild concentric LVH.  Left atrium mildly dilated.     EKG:   EKG Interpretation Date/Time:  Friday May 14 2024 08:21:52 EST Ventricular Rate:  95 PR Interval:  170 QRS Duration:  132 QT Interval:  384 QTC Calculation: 482 R Axis:   245  Text Interpretation: Normal sinus rhythm Right bundle branch block When compared with ECG of 26-Nov-2023 05:43, PR interval has decreased Vent. rate has increased BY  35 BPM RBBB has replaced incomplete RBBB Confirmed by Nancey Scotts [phone]) on 05/14/2024 8:33:34 AM     Physical Exam:    VS:  BP 114/60 (BP Location: Right Arm, Patient Position: Sitting, Cuff Size: Large)   Pulse 95   Ht 6' (1.829 m)   Wt 190  lb (86.2 kg)   SpO2 94%   BMI 25.77 kg/m     Wt Readings from Last 3 Encounters:  05/14/24 190 lb (86.2 kg)  11/26/23 183 lb 11.2 oz (83.3 kg)  07/25/23 187 lb 12.8 oz (85.2 kg)     GEN: Well nourished, well developed in no acute distress CARDIAC: RRR, no murmurs, rubs, gallops RESPIRATORY:  Normal work of breathing MUSCULOSKELETAL: no edema    ASSESSMENT & PLAN:     Atrial fibrillation  Paroxysmal with very low burden -- almost a year since an episode But, has history of stroke -- continue anticoagulation  Loop recorder at RRT He is not particularly compelled to have it removed I think ongoing monitoring of atrial fibrillation, since he sometimes has rapid rates, and he will not alert anyone if he is experiencing symptoms My concern is that without monitoring, he may have prolonged episodes of atrial fibrillation with RVR and developed heart failure. The objective for further monitoring would be to prevent episodes of heart failure due to progression of his A-fib to more prolonged, asymptomatic AF with RVR We will plan to remove and replace the device.      Signed, Scotts FORBES Nancey, MD  05/14/2024 8:34 AM    [facility]

## 2024-05-14 ENCOUNTER — Ambulatory Visit: Attending: Cardiovascular Disease | Admitting: Cardiovascular Disease

## 2024-05-14 ENCOUNTER — Encounter: Payer: Self-pay | Admitting: Cardiovascular Disease

## 2024-05-14 VITALS — BP 114/60 | HR 95 | Ht 72.0 in | Wt 190.0 lb

## 2024-05-14 DIAGNOSIS — I480 Paroxysmal atrial fibrillation: Secondary | ICD-10-CM | POA: Diagnosis not present

## 2024-05-14 DIAGNOSIS — I639 Cerebral infarction, unspecified: Secondary | ICD-10-CM | POA: Diagnosis not present

## 2024-05-14 NOTE — Patient Instructions (Signed)
 Medication Instructions:  Your physician recommends that you continue on your current medications as directed. Please refer to the Current Medication list given to you today.  *If you need a refill on your cardiac medications before your next appointment, please call your pharmacy*  Lab Work: None ordered.  If you have labs (blood work) drawn today and your tests are completely normal, you will receive your results only by: MyChart Message (if you have MyChart) OR A paper copy in the mail If you have any lab test that is abnormal or we need to change your treatment, we will call you to review the results.  Testing/Procedures: None ordered.   Follow-Up: At [facility], you and your health needs are our priority.  As part of our continuing mission to provide you with exceptional heart care, our providers are all part of one team.  This team includes your primary Cardiologist (physician) and Advanced Practice Providers or APPs (Physician Assistants and Nurse Practitioners) who all work together to provide you with the care you need, when you need it.  Your next appointment:   To be scheduled

## 2024-05-17 ENCOUNTER — Ambulatory Visit: Payer: Medicare Other

## 2024-05-27 ENCOUNTER — Ambulatory Visit

## 2024-06-21 ENCOUNTER — Ambulatory Visit: Payer: Medicare Other

## 2024-06-28 ENCOUNTER — Ambulatory Visit

## 2024-07-07 NOTE — Progress Notes (Signed)
 " Electrophysiology Office Note:    Date:  07/08/2024   ID:  Adrian Rodgers, DOB Dec 21, 1940, MRN [id number]  PCP:  Minta Jon BROCKS, NP   [facility] Providers Cardiologist:  None     Referring MD: Minta Jon BROCKS, NP   History of Present Illness:    Adrian Rodgers is a 84 y.o. male with a medical history significant for stroke with ILR in place, HTN, emphysema referred for arrhythmia management.       History of Present Illness ILR was implanted in December 2021 after a cryptogenic stroke. She has since been diagnosed with atrial fibrillation and transitioned from antiplatelet to Eliquis .  The AF burden has been low, so a rate control approach has been used for now with ongoing monitoring with the ILR.  It has been almost a year since the last episode of atrial fibrillation detected by the loop recorder.  He is accompanied by his daughter today.  His daughter says he does not complain or notify her if he feels unwell or is having any issues.  His ILR battery has reached EOL, 90% stated to discuss having it removed.         Today, he reports he feels well and has no complaints.  He presents to have his old loop recorder explanted and a new device implanted.  EKGs/Labs/Other Studies Reviewed Today:     Echocardiogram:  TTE May 2025 LVEF 60 to 65%.  Grade 1 diastolic dysfunction.  Mild concentric LVH.  Left atrium mildly dilated.     EKG:   EKG Interpretation Date/Time:  Thursday July 08 2024 08:29:36 EST Ventricular Rate:  90 PR Interval:  176 QRS Duration:  146 QT Interval:  406 QTC Calculation: 496 R Axis:   242  Text Interpretation: Normal sinus rhythm Right bundle branch block Possible Inferior infarct , age undetermined When compared with ECG of 14-May-2024 08:21, No significant change was found Confirmed by Nancey Scotts [phone]) on 07/08/2024 8:38:56 AM     Physical Exam:    VS:  BP (!) 130/90 (BP Location: Left Arm, Patient Position:  Sitting, Cuff Size: Normal)   Pulse 90   Ht 6' (1.829 m)   Wt 198 lb 1.6 oz (89.9 kg)   SpO2 94%   BMI 26.87 kg/m     Wt Readings from Last 3 Encounters:  07/08/24 198 lb 1.6 oz (89.9 kg)  05/14/24 190 lb (86.2 kg)  11/26/23 183 lb 11.2 oz (83.3 kg)     GEN: Well nourished, well developed in no acute distress CARDIAC: RRR, no murmurs, rubs, gallops RESPIRATORY:  Normal work of breathing MUSCULOSKELETAL: no edema    ASSESSMENT & PLAN:     Atrial fibrillation  Paroxysmal with very low burden -- almost a year since an episode But, has history of stroke -- continue anticoagulation  Loop recorder at RRT He is not particularly compelled to have it removed I think ongoing monitoring of atrial fibrillation, since he sometimes has rapid rates, and he will not alert anyone if he is experiencing symptoms My concern is that without monitoring, he may have prolonged episodes of atrial fibrillation with RVR and developed heart failure. The objective for further monitoring would be to prevent episodes of heart failure due to progression of his A-fib to more prolonged, asymptomatic AF with RVR We will plan to remove and replace the device.   After obtaining informed consent, the patient was prepped and draped in usual sterile fashion.  The loop recorder was  located in the left prepectoral area.  Approximately 6cc of local anesthetic was injected over the medial end of the device.  A 1cm incision was made at this location.  The loop recorder was grasped with sterile forceps.  The scar tissue capsule was incised to release the device, which was then removed with gentle traction.  Steri-Strips and a sterile dressing were applied.   EBL 2 cc.   ILR implant    PREPROCEDURE DIAGNOSIS:  Atrial fibrillation    POSTPROCEDURE DIAGNOSIS: Atrial fibrillation     PROCEDURES:   1. Implantable loop recorder implantation    INTRODUCTION:  Adrian Rodgers presents with a history of atrial  fibrillation The costs of loop recorder monitoring have been discussed with the patient.    DESCRIPTION OF PROCEDURE:  Informed written consent was obtained.  A timeout was performed. The patient required no sedation for the procedure today. The patients left chest was prepped and draped in the usual sterile fashion. The skin overlying the left parasternal region was infiltrated with lidocaine  for local analgesia.  A 0.5-cm incision was made over the left parasternal region over the 3rd intercostal space.  A subcutaneous ILR pocket was fashioned using a combination of sharp and blunt dissection.  A Medtronic Reveal LINQ 2 implantable loop recorder (SN [id number]) was then placed into the pocket  R waves were very prominent and measured >0.27mV.  Steri- Strips and a sterile dressing were then applied.  There were no early apparent complications.     CONCLUSIONS:   1. Successful implantation of a implantable loop recorder for a history of atrial fibrillation  2. No early apparent complications.   Eulas FORBES Furbish, MD  Cardiac Electrophysiology      Signed, Eulas FORBES Furbish, MD  07/08/2024 8:39 AM    [facility] "

## 2024-07-08 ENCOUNTER — Ambulatory Visit: Attending: Cardiovascular Disease | Admitting: Cardiovascular Disease

## 2024-07-08 ENCOUNTER — Encounter: Payer: Self-pay | Admitting: Cardiovascular Disease

## 2024-07-08 VITALS — BP 130/90 | HR 90 | Ht 72.0 in | Wt 198.1 lb

## 2024-07-08 DIAGNOSIS — I480 Paroxysmal atrial fibrillation: Secondary | ICD-10-CM

## 2024-07-08 DIAGNOSIS — I10 Essential (primary) hypertension: Secondary | ICD-10-CM

## 2024-07-08 DIAGNOSIS — Z8673 Personal history of transient ischemic attack (TIA), and cerebral infarction without residual deficits: Secondary | ICD-10-CM

## 2024-07-08 NOTE — Patient Instructions (Addendum)
 Medication Instructions:  Your physician recommends that you continue on your current medications as directed. Please refer to the Current Medication list given to you today.  Labwork: None ordered.  Testing/Procedures: None ordered.  Follow-Up:  Your physician wants you to follow-up in: one year with Dr Nancey or his PA.[email or web address]  You will receive a reminder letter in the mail two months in advance. If you don't receive a letter, please call our office to schedule the follow-up appointment.    Implantable Loop Recorder Placement, Care After This sheet gives you information about how to care for yourself after your procedure. Your health care provider may also give you more specific instructions. If you have problems or questions, contact your health care provider. What can I expect after the procedure? After the procedure, it is common to have: Soreness or discomfort near the incision. Some swelling or bruising near the incision.  Follow these instructions at home: Incision care  Monitor your cardiac device site for redness, swelling, and drainage. Call the device clinic at [phone] if you experience these symptoms or fever/chills.  Keep the large square bandage on your site for 24 hours and then you may remove it yourself. Keep the steri-strips underneath in place.   You may shower after 72 hours / 3 days from your procedure with the steri-strips in place. They will usually fall off on their own, or may be removed after 10 days. Pat dry.   Avoid lotions, ointments, or perfumes over your incision until it is well-healed.  Please do not submerge in water until your site is completely healed.   Your device is MRI compatible.   Remote monitoring is used to monitor your cardiac device from home. This monitoring is scheduled every month by our office. It allows us  to keep an eye on the function of your device to ensure it is working properly.  If your wound site starts to bleed apply  pressure.    For help with the monitor please call [employer] Monitor Support Specialist directly at [phone].    If you have any questions/concerns please call the device clinic at [phone].  Activity  Return to your normal activities.  General instructions Follow instructions from your health care provider about how to manage your implantable loop recorder and transmit the information. Learn how to activate a recording if this is necessary for your type of device. You may go through a metal detection gate, and you may let someone hold a metal detector over your chest. Show your ID card if needed. Do not have an MRI unless you check with your health care provider first. Take over-the-counter and prescription medicines only as told by your health care provider. Keep all follow-up visits as told by your health care provider. This is important. Contact a health care provider if: You have redness, swelling, or pain around your incision. You have a fever. You have pain that is not relieved by your pain medicine. You have triggered your device because of fainting (syncope) or because of a heartbeat that feels like it is racing, slow, fluttering, or skipping (palpitations). Get help right away if you have: Chest pain. Difficulty breathing. Summary After the procedure, it is common to have soreness or discomfort near the incision. Change your dressing as told by your health care provider. Follow instructions from your health care provider about how to manage your implantable loop recorder and transmit the information. Keep all follow-up visits as told by your health care provider. This is  important. This information is not intended to replace advice given to you by your health care provider. Make sure you discuss any questions you have with your health care provider. Document Released: 05/29/2015 Document Revised: 08/02/2017 Document Reviewed: 08/02/2017 Elsevier Patient Education   2020 [employer].

## 2024-07-26 ENCOUNTER — Ambulatory Visit: Payer: Medicare Other

## 2024-07-29 ENCOUNTER — Ambulatory Visit

## 2024-08-08 ENCOUNTER — Ambulatory Visit

## 2024-08-09 ENCOUNTER — Ambulatory Visit

## 2024-08-10 ENCOUNTER — Ambulatory Visit

## 2024-08-11 ENCOUNTER — Ambulatory Visit

## 2024-08-12 ENCOUNTER — Ambulatory Visit

## 2024-08-13 ENCOUNTER — Ambulatory Visit

## 2024-08-14 ENCOUNTER — Ambulatory Visit

## 2024-08-15 ENCOUNTER — Ambulatory Visit

## 2024-08-16 ENCOUNTER — Ambulatory Visit

## 2024-08-17 ENCOUNTER — Ambulatory Visit

## 2024-08-30 ENCOUNTER — Ambulatory Visit

## 2024-08-30 ENCOUNTER — Ambulatory Visit: Payer: Medicare Other

## 2024-09-08 ENCOUNTER — Ambulatory Visit

## 2024-09-30 ENCOUNTER — Ambulatory Visit

## 2024-10-04 ENCOUNTER — Ambulatory Visit: Payer: Medicare Other

## 2024-10-09 ENCOUNTER — Ambulatory Visit

## 2024-11-01 ENCOUNTER — Ambulatory Visit

## 2024-11-08 ENCOUNTER — Ambulatory Visit: Payer: Medicare Other

## 2024-11-09 ENCOUNTER — Ambulatory Visit

## 2024-12-02 ENCOUNTER — Ambulatory Visit

## 2024-12-10 ENCOUNTER — Ambulatory Visit

## 2024-12-13 ENCOUNTER — Ambulatory Visit: Payer: Medicare Other

## 2025-01-10 ENCOUNTER — Ambulatory Visit

## 2025-01-17 ENCOUNTER — Ambulatory Visit: Payer: Medicare Other

## 2025-02-10 ENCOUNTER — Ambulatory Visit

## 2025-02-21 ENCOUNTER — Ambulatory Visit: Payer: Medicare Other

## 2025-03-13 ENCOUNTER — Ambulatory Visit

## 2025-04-13 ENCOUNTER — Ambulatory Visit

## 2025-05-14 ENCOUNTER — Ambulatory Visit
